# Patient Record
Sex: Female | Born: 1950 | Race: White | Hispanic: No | Smoking: Former smoker
Health system: Southern US, Community
[De-identification: ages and names within clinical notes are randomized; demographics above are authoritative.]

## PROBLEM LIST (undated history)

## (undated) DIAGNOSIS — Z950 Presence of cardiac pacemaker: Secondary | ICD-10-CM

## (undated) DIAGNOSIS — Z87442 Personal history of urinary calculi: Secondary | ICD-10-CM

## (undated) DIAGNOSIS — G629 Polyneuropathy, unspecified: Secondary | ICD-10-CM

## (undated) DIAGNOSIS — Z9221 Personal history of antineoplastic chemotherapy: Secondary | ICD-10-CM

## (undated) DIAGNOSIS — G4710 Hypersomnia, unspecified: Secondary | ICD-10-CM

## (undated) DIAGNOSIS — H409 Unspecified glaucoma: Secondary | ICD-10-CM

## (undated) DIAGNOSIS — C50919 Malignant neoplasm of unspecified site of unspecified female breast: Secondary | ICD-10-CM

## (undated) DIAGNOSIS — I429 Cardiomyopathy, unspecified: Secondary | ICD-10-CM

## (undated) DIAGNOSIS — M1990 Unspecified osteoarthritis, unspecified site: Secondary | ICD-10-CM

## (undated) DIAGNOSIS — E039 Hypothyroidism, unspecified: Secondary | ICD-10-CM

## (undated) DIAGNOSIS — Z923 Personal history of irradiation: Secondary | ICD-10-CM

## (undated) DIAGNOSIS — H269 Unspecified cataract: Secondary | ICD-10-CM

## (undated) DIAGNOSIS — Z8679 Personal history of other diseases of the circulatory system: Secondary | ICD-10-CM

## (undated) DIAGNOSIS — R519 Headache, unspecified: Secondary | ICD-10-CM

## (undated) DIAGNOSIS — N189 Chronic kidney disease, unspecified: Secondary | ICD-10-CM

## (undated) DIAGNOSIS — E119 Type 2 diabetes mellitus without complications: Secondary | ICD-10-CM

## (undated) DIAGNOSIS — K219 Gastro-esophageal reflux disease without esophagitis: Secondary | ICD-10-CM

## (undated) DIAGNOSIS — T7840XA Allergy, unspecified, initial encounter: Secondary | ICD-10-CM

## (undated) DIAGNOSIS — R000 Tachycardia, unspecified: Secondary | ICD-10-CM

## (undated) DIAGNOSIS — I10 Essential (primary) hypertension: Secondary | ICD-10-CM

## (undated) DIAGNOSIS — G43909 Migraine, unspecified, not intractable, without status migrainosus: Secondary | ICD-10-CM

## (undated) DIAGNOSIS — R600 Localized edema: Secondary | ICD-10-CM

## (undated) HISTORY — DX: Hypomagnesemia: E83.42

## (undated) HISTORY — PX: CARPAL TUNNEL RELEASE: SHX101

## (undated) HISTORY — DX: Chronic kidney disease, unspecified: N18.9

## (undated) HISTORY — DX: Unspecified cataract: H26.9

## (undated) HISTORY — PX: CHOLECYSTECTOMY: SHX55

## (undated) HISTORY — DX: Cardiomyopathy, unspecified: I42.9

## (undated) HISTORY — DX: Malignant neoplasm of unspecified site of unspecified female breast: C50.919

## (undated) HISTORY — DX: Tachycardia, unspecified: R00.0

## (undated) HISTORY — DX: Localized edema: R60.0

## (undated) HISTORY — DX: Presence of cardiac pacemaker: Z95.0

## (undated) HISTORY — PX: ANKLE SURGERY: SHX546

## (undated) HISTORY — DX: Type 2 diabetes mellitus without complications: E11.9

## (undated) HISTORY — DX: Allergy, unspecified, initial encounter: T78.40XA

## (undated) HISTORY — DX: Hypersomnia, unspecified: G47.10

## (undated) HISTORY — DX: Essential (primary) hypertension: I10

## (undated) HISTORY — PX: BREAST LUMPECTOMY: SHX2

---

## 1991-08-27 HISTORY — PX: GALLBLADDER SURGERY: SHX652

## 2002-08-26 DIAGNOSIS — Z950 Presence of cardiac pacemaker: Secondary | ICD-10-CM

## 2002-08-26 HISTORY — DX: Presence of cardiac pacemaker: Z95.0

## 2003-05-13 ENCOUNTER — Ambulatory Visit (HOSPITAL_COMMUNITY): Admission: RE | Admit: 2003-05-13 | Discharge: 2003-05-14 | Payer: Self-pay | Admitting: Internal Medicine

## 2003-05-13 ENCOUNTER — Encounter: Payer: Self-pay | Admitting: Internal Medicine

## 2003-05-13 HISTORY — PX: PACEMAKER INSERTION: SHX728

## 2003-05-14 ENCOUNTER — Encounter: Payer: Self-pay | Admitting: Internal Medicine

## 2005-05-06 ENCOUNTER — Ambulatory Visit: Payer: Self-pay | Admitting: Internal Medicine

## 2005-11-04 ENCOUNTER — Ambulatory Visit: Payer: Self-pay

## 2006-02-18 ENCOUNTER — Ambulatory Visit: Payer: Self-pay | Admitting: Gastroenterology

## 2006-05-01 ENCOUNTER — Ambulatory Visit: Payer: Self-pay

## 2007-03-20 ENCOUNTER — Ambulatory Visit: Payer: Self-pay

## 2007-08-27 DIAGNOSIS — Z923 Personal history of irradiation: Secondary | ICD-10-CM

## 2007-08-27 DIAGNOSIS — Z9221 Personal history of antineoplastic chemotherapy: Secondary | ICD-10-CM

## 2007-08-27 HISTORY — DX: Personal history of irradiation: Z92.3

## 2007-08-27 HISTORY — DX: Personal history of antineoplastic chemotherapy: Z92.21

## 2007-08-27 HISTORY — PX: BREAST LUMPECTOMY: SHX2

## 2007-08-27 HISTORY — PX: BREAST EXCISIONAL BIOPSY: SUR124

## 2007-09-27 ENCOUNTER — Ambulatory Visit: Payer: Self-pay | Admitting: Oncology

## 2007-09-27 DIAGNOSIS — C50919 Malignant neoplasm of unspecified site of unspecified female breast: Secondary | ICD-10-CM | POA: Insufficient documentation

## 2007-09-27 DIAGNOSIS — C50912 Malignant neoplasm of unspecified site of left female breast: Secondary | ICD-10-CM

## 2007-09-27 HISTORY — DX: Malignant neoplasm of unspecified site of left female breast: C50.912

## 2007-09-27 HISTORY — DX: Malignant neoplasm of unspecified site of unspecified female breast: C50.919

## 2007-10-13 ENCOUNTER — Other Ambulatory Visit: Payer: Self-pay

## 2007-10-13 ENCOUNTER — Ambulatory Visit: Payer: Self-pay | Admitting: General Surgery

## 2007-10-16 ENCOUNTER — Ambulatory Visit: Payer: Self-pay | Admitting: General Surgery

## 2007-10-20 ENCOUNTER — Ambulatory Visit: Payer: Self-pay | Admitting: Oncology

## 2007-10-25 ENCOUNTER — Ambulatory Visit: Payer: Self-pay | Admitting: Oncology

## 2007-10-26 ENCOUNTER — Ambulatory Visit: Payer: Self-pay | Admitting: Oncology

## 2007-11-23 ENCOUNTER — Ambulatory Visit: Payer: Self-pay | Admitting: General Surgery

## 2007-11-25 ENCOUNTER — Ambulatory Visit: Payer: Self-pay | Admitting: Oncology

## 2007-12-25 ENCOUNTER — Ambulatory Visit: Payer: Self-pay | Admitting: Oncology

## 2008-01-25 ENCOUNTER — Ambulatory Visit: Payer: Self-pay | Admitting: Oncology

## 2008-02-24 ENCOUNTER — Ambulatory Visit: Payer: Self-pay | Admitting: Oncology

## 2008-03-26 ENCOUNTER — Ambulatory Visit: Payer: Self-pay | Admitting: Oncology

## 2008-04-18 ENCOUNTER — Ambulatory Visit: Payer: Self-pay | Admitting: Cardiology

## 2008-04-26 ENCOUNTER — Ambulatory Visit: Payer: Self-pay | Admitting: Oncology

## 2008-05-26 ENCOUNTER — Ambulatory Visit: Payer: Self-pay | Admitting: Oncology

## 2008-06-26 ENCOUNTER — Ambulatory Visit: Payer: Self-pay | Admitting: Oncology

## 2008-07-18 ENCOUNTER — Ambulatory Visit: Payer: Self-pay | Admitting: Internal Medicine

## 2008-07-26 ENCOUNTER — Ambulatory Visit: Payer: Self-pay | Admitting: Oncology

## 2008-08-26 ENCOUNTER — Ambulatory Visit: Payer: Self-pay | Admitting: Oncology

## 2008-09-26 ENCOUNTER — Ambulatory Visit: Payer: Self-pay | Admitting: Oncology

## 2008-10-24 ENCOUNTER — Ambulatory Visit: Payer: Self-pay | Admitting: Oncology

## 2008-11-24 ENCOUNTER — Ambulatory Visit: Payer: Self-pay | Admitting: Oncology

## 2008-12-02 ENCOUNTER — Encounter (INDEPENDENT_AMBULATORY_CARE_PROVIDER_SITE_OTHER): Payer: Self-pay

## 2008-12-24 ENCOUNTER — Ambulatory Visit: Payer: Self-pay | Admitting: Oncology

## 2009-01-13 DIAGNOSIS — Z950 Presence of cardiac pacemaker: Secondary | ICD-10-CM

## 2009-01-18 ENCOUNTER — Ambulatory Visit: Payer: Self-pay | Admitting: Internal Medicine

## 2009-01-24 ENCOUNTER — Ambulatory Visit: Payer: Self-pay | Admitting: Oncology

## 2009-02-23 ENCOUNTER — Ambulatory Visit: Payer: Self-pay | Admitting: Oncology

## 2009-03-26 ENCOUNTER — Ambulatory Visit: Payer: Self-pay | Admitting: Oncology

## 2009-04-03 ENCOUNTER — Encounter: Payer: Self-pay | Admitting: Internal Medicine

## 2009-04-03 ENCOUNTER — Ambulatory Visit: Payer: Self-pay | Admitting: Oncology

## 2009-04-26 ENCOUNTER — Ambulatory Visit: Payer: Self-pay | Admitting: Oncology

## 2009-05-26 ENCOUNTER — Ambulatory Visit: Payer: Self-pay | Admitting: Oncology

## 2009-06-26 ENCOUNTER — Ambulatory Visit: Payer: Self-pay | Admitting: Oncology

## 2009-07-06 ENCOUNTER — Ambulatory Visit: Payer: Self-pay | Admitting: Oncology

## 2009-07-26 ENCOUNTER — Ambulatory Visit: Payer: Self-pay | Admitting: Oncology

## 2009-08-26 HISTORY — PX: INSERT / REPLACE / REMOVE PACEMAKER: SUR710

## 2009-09-25 ENCOUNTER — Ambulatory Visit: Payer: Self-pay | Admitting: Internal Medicine

## 2009-10-09 ENCOUNTER — Ambulatory Visit: Payer: Self-pay | Admitting: Internal Medicine

## 2009-10-23 ENCOUNTER — Ambulatory Visit: Payer: Self-pay | Admitting: Oncology

## 2009-10-24 ENCOUNTER — Ambulatory Visit: Payer: Self-pay | Admitting: Oncology

## 2009-10-26 ENCOUNTER — Ambulatory Visit: Payer: Self-pay | Admitting: Oncology

## 2009-10-26 ENCOUNTER — Encounter: Payer: Self-pay | Admitting: Internal Medicine

## 2009-11-13 ENCOUNTER — Ambulatory Visit: Payer: Self-pay | Admitting: Internal Medicine

## 2009-11-24 ENCOUNTER — Ambulatory Visit: Payer: Self-pay | Admitting: Oncology

## 2009-12-18 ENCOUNTER — Ambulatory Visit: Payer: Self-pay | Admitting: Internal Medicine

## 2010-01-24 ENCOUNTER — Ambulatory Visit: Payer: Self-pay | Admitting: Oncology

## 2010-01-25 ENCOUNTER — Encounter: Payer: Self-pay | Admitting: Internal Medicine

## 2010-01-25 ENCOUNTER — Ambulatory Visit: Payer: Self-pay | Admitting: Oncology

## 2010-02-06 ENCOUNTER — Ambulatory Visit: Payer: Self-pay | Admitting: Internal Medicine

## 2010-02-23 ENCOUNTER — Ambulatory Visit: Payer: Self-pay | Admitting: Oncology

## 2010-03-12 ENCOUNTER — Ambulatory Visit: Payer: Self-pay | Admitting: Internal Medicine

## 2010-04-23 ENCOUNTER — Ambulatory Visit: Payer: Self-pay | Admitting: Internal Medicine

## 2010-04-26 ENCOUNTER — Ambulatory Visit: Payer: Self-pay | Admitting: Oncology

## 2010-05-24 ENCOUNTER — Ambulatory Visit: Payer: Self-pay | Admitting: Oncology

## 2010-05-24 ENCOUNTER — Encounter: Payer: Self-pay | Admitting: Internal Medicine

## 2010-05-26 ENCOUNTER — Ambulatory Visit: Payer: Self-pay | Admitting: Oncology

## 2010-06-22 ENCOUNTER — Encounter (INDEPENDENT_AMBULATORY_CARE_PROVIDER_SITE_OTHER): Payer: Self-pay | Admitting: *Deleted

## 2010-06-26 ENCOUNTER — Ambulatory Visit: Payer: Self-pay | Admitting: Oncology

## 2010-07-02 ENCOUNTER — Ambulatory Visit: Payer: Self-pay | Admitting: Internal Medicine

## 2010-07-02 DIAGNOSIS — I440 Atrioventricular block, first degree: Secondary | ICD-10-CM | POA: Insufficient documentation

## 2010-07-04 ENCOUNTER — Telehealth: Payer: Self-pay | Admitting: Internal Medicine

## 2010-07-04 ENCOUNTER — Emergency Department (HOSPITAL_COMMUNITY): Admission: EM | Admit: 2010-07-04 | Discharge: 2010-07-04 | Payer: Self-pay | Admitting: Emergency Medicine

## 2010-07-25 ENCOUNTER — Telehealth: Payer: Self-pay | Admitting: Internal Medicine

## 2010-07-26 ENCOUNTER — Ambulatory Visit: Payer: Self-pay | Admitting: Oncology

## 2010-07-31 ENCOUNTER — Ambulatory Visit: Payer: Self-pay | Admitting: Internal Medicine

## 2010-07-31 ENCOUNTER — Ambulatory Visit: Payer: Self-pay

## 2010-07-31 ENCOUNTER — Encounter: Payer: Self-pay | Admitting: Internal Medicine

## 2010-07-31 ENCOUNTER — Encounter: Payer: Self-pay | Admitting: Cardiovascular Disease

## 2010-07-31 LAB — CONVERTED CEMR LAB
BUN: 16 mg/dL (ref 6–23)
Calcium: 9.5 mg/dL (ref 8.4–10.5)
Creatinine, Ser: 0.66 mg/dL (ref 0.40–1.20)
Glucose, Bld: 94 mg/dL (ref 70–99)
INR: 0.98 (ref <1.50)
Prothrombin Time: 13.2 s (ref 11.6–15.2)

## 2010-08-03 ENCOUNTER — Ambulatory Visit (HOSPITAL_COMMUNITY)
Admission: RE | Admit: 2010-08-03 | Discharge: 2010-08-03 | Payer: Self-pay | Source: Home / Self Care | Attending: Internal Medicine | Admitting: Internal Medicine

## 2010-08-03 ENCOUNTER — Encounter: Payer: Self-pay | Admitting: Internal Medicine

## 2010-08-15 ENCOUNTER — Ambulatory Visit: Payer: Self-pay | Admitting: Internal Medicine

## 2010-08-16 ENCOUNTER — Encounter: Payer: Self-pay | Admitting: Internal Medicine

## 2010-08-26 ENCOUNTER — Ambulatory Visit: Payer: Self-pay | Admitting: Oncology

## 2010-08-30 LAB — CANCER ANTIGEN 27.29: CA 27.29: 28.2 U/mL (ref 0.0–38.6)

## 2010-09-25 NOTE — Procedures (Signed)
Summary: PACER/AMD      Allergies Added:   Current Medications (verified): 1)  Benazepril Hcl 20 Mg Tabs (Benazepril Hcl) .... One  By Mouth Daily 2)  Atenolol 50 Mg Tabs (Atenolol) .... One By Mouth Daily  Allergies (verified): 1)  ! Penicillin   PPM Specifications Following MD:  Sherryl Manges, MD     [facility] Vendor:  [facility] [employer]     PPM Model Number:  [id number]     PPM Serial Number:  [id number] PPM DOI:  05/13/2003     PPM Implanting MD:  Sherryl Manges, MD  Lead 1    Location: RA     DOI: 05/13/2003     Model #: [id number]     Serial #: [id number]     Status: active Lead 2    Location: RV     DOI: 05/13/2003     Model #: 1478     Serial #: [id number]     Status: active  Magnet Response Rate:  BOL 100 ERI  85  Indications:  1ST HB with CHF   PPM Follow Up Remote Check?  No Battery Voltage:  good V     Battery Est. Longevity:  <0.5 years     Pacer Dependent:  No      Episodes Coumadin:  No  Parameters Mode:  DDD     Lower Rate Limit:  60     Upper Rate Limit:  130 Paced AV Delay:  150     Sensed AV Delay:  80 Next Cardiology Appt Due:  10/24/2009 Tech Comments:  Battery check only today, near ERI.  ROV 1 month in [place] with Dr. Graciela Husbands. Altha Harm, LPN  October 09, 2009 8:56 AM

## 2010-09-25 NOTE — Progress Notes (Signed)
Summary: [facility]  [facility]   Imported By: Harlon Flor 11/08/2009 12:02:22  _____________________________________________________________________  External Attachment:    Type:   Image     Comment:   External Document

## 2010-09-25 NOTE — Procedures (Signed)
Summary: ROV/AMD    Allergies: 1)  ! Penicillin   PPM Specifications Following MD:  Sherryl Manges, MD     [facility] Vendor:  [facility] [employer]     PPM Model Number:  [phone]     PPM Serial Number:  [id number] PPM DOI:  05/13/2003     PPM Implanting MD:  Sherryl Manges, MD  Lead 1    Location: RA     DOI: 05/13/2003     Model #: 4479     Serial #: [id number]     Status: active Lead 2    Location: RV     DOI: 05/13/2003     Model #: 1478     Serial #: [id number]     Status: active  Magnet Response Rate:  BOL 100 ERI  85  Indications:  1ST HB with CHF   PPM Follow Up Remote Check?  No Battery Voltage:  good V     Battery Est. Longevity:  <0.5 years     Pacer Dependent:  No      Episodes Coumadin:  No  Parameters Mode:  DDD     Lower Rate Limit:  60     Upper Rate Limit:  130 Paced AV Delay:  150     Sensed AV Delay:  80 Tech Comments:  Battery check only today. Near ERI.  ROV 1 months with Dr. Graciela Husbands in [place]. Altha Harm, LPN  December 18, 2009 11:12 AM

## 2010-09-25 NOTE — Op Note (Signed)
Summary: Implant Order  Implant Order   Imported By: Harlon Flor 08/02/2010 14:41:09  _____________________________________________________________________  External Attachment:    Type:   Image     Comment:   External Document

## 2010-09-25 NOTE — Cardiovascular Report (Signed)
Summary: Office Visit   Office Visit   Imported By: Roderic Ovens 03/23/2010 10:19:07  _____________________________________________________________________  External Attachment:    Type:   Image     Comment:   External Document

## 2010-09-25 NOTE — Assessment & Plan Note (Signed)
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 She also has a history of breast cancer for which she is undergoing therapy which as been complicated by some degree of nonischemic cardiomyopathy.  she has no major complaints. The patient denies SOB, chest pain, edema or palpitations   Current Medications (verified): 1)  Benazepril Hcl 20 Mg Tabs (Benazepril Hcl) .... One  By Mouth Daily 2)  Atenolol 50 Mg Tabs (Atenolol) .... One By Mouth Daily  Allergies (verified): 1)  ! Penicillin  Physical Exam  General:  The patient was alert and oriented in no acute distress. HEENT Normal.  Neck veins were flat, carotids were brisk.  Lungs were clear.  Heart sounds were regular without murmurs or gallops.  Abdomen was soft with active bowel sounds. There is no clubbing cyanosis or edema. Skin Warm and dry    PPM Specifications Following MD:  Sherryl Manges, MD     [facility] Vendor:  [facility] [employer]     PPM Model Number:  [phone]     PPM Serial Number:  [id number] PPM DOI:  05/13/2003     PPM Implanting MD:  Sherryl Manges, MD  Lead 1    Location: RA     DOI: 05/13/2003     Model #: 4479     Serial #: [id number]     Status: active Lead 2    Location: RV     DOI: 05/13/2003     Model #: 6440     Serial #: [id number]     Status: active  Magnet Response Rate:  BOL 100 ERI  85  Indications:  1ST HB with CHF   PPM Follow Up Remote Check?  No Battery Voltage:  good V     Battery Est. Longevity:  <0.5 years     Pacer Dependent:  No      Episodes Coumadin:  No  Parameters Mode:  DDD     Lower Rate Limit:  60     Upper Rate Limit:  130 Paced AV Delay:  150     Sensed AV Delay:  80 Tech Comments:  Battery check only today, 5% remaining.   ROV 1 months with Dr. Graciela Husbands in [place]. 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 One  by mouth daily    Atenolol 50 Mg Tabs (Atenolol) ..... One by mouth daily  Problem # 3:  CARDIOMYOPATHY, SECONDARY (ICD-425.9) Continue current medications; she may need her repeat evaluation of left ventricular function Her updated medication list for this problem includes:    Benazepril Hcl 20 Mg Tabs (Benazepril hcl) ..... One  by mouth daily    Atenolol 50 Mg Tabs (Atenolol) ..... One by mouth daily

## 2010-09-25 NOTE — Procedures (Signed)
Summary: PACER/AMD  Medications Added BENAZEPRIL HCL 20 MG TABS (BENAZEPRIL HCL) one  by mouth daily ATENOLOL 50 MG TABS (ATENOLOL) one by mouth daily      Allergies Added: ! PENICILLIN  Current Medications (verified): 1)  Benazepril Hcl 20 Mg Tabs (Benazepril Hcl) .... One  By Mouth Daily 2)  Atenolol 50 Mg Tabs (Atenolol) .... One By Mouth Daily  Allergies (verified): 1)  ! Penicillin   PPM Specifications Following MD:  Sherryl Manges, MD     [facility] Vendor:  [facility] [employer]     PPM Model Number:  [phone]     PPM Serial Number:  [id number] PPM DOI:  05/13/2003     PPM Implanting MD:  Sherryl Manges, MD  Lead 1    Location: RA     DOI: 05/13/2003     Model #: 4479     Serial #: [id number]     Status: active Lead 2    Location: RV     DOI: 05/13/2003     Model #: 1478     Serial #: [id number]     Status: active  Magnet Response Rate:  BOL 100 ERI  85  Indications:  1ST HB with CHF   PPM Follow Up Remote Check?  No Battery Voltage:  good V     Battery Est. Longevity:  <0.5years     Pacer Dependent:  No       PPM Device Measurements Atrium  Amplitude: 1.4 mV, Impedance: 420 ohms, Threshold: 1.0 V at 0.4 msec Right Ventricle  Amplitude: 12 mV, Impedance: 430 ohms, Threshold: 0.7 V at 0.4 msec  Episodes MS Episodes:  4     Percent Mode Switch:  0%     Coumadin:  No Ventricular High Rate:  1     Atrial Pacing:  0%     Ventricular Pacing:  99%  Parameters Mode:  DDD     Lower Rate Limit:  60     Upper Rate Limit:  130 Paced AV Delay:  150     Sensed AV Delay:  80 Next Cardiology Appt Due:  11/24/2009 Tech Comments:  No parameter changes.  One episode of questionable VT 16 beats, I will discuss this with Dr. Graciela Husbands.    Battery near ERI.  We will check her battery on a monthly basis. Altha Harm, LPN  September 25, 2009 8:09 AM

## 2010-09-25 NOTE — Assessment & Plan Note (Signed)
Summary: F1M/AMD    Allergies: 1)  ! Penicillin   PPM Specifications Following MD:  Sherryl Manges, MD     [facility] Vendor:  [facility] [employer]     PPM Model Number:  [phone]     PPM Serial Number:  [id number] PPM DOI:  05/13/2003     PPM Implanting MD:  Sherryl Manges, MD  Lead 1    Location: RA     DOI: 05/13/2003     Model #: 4479     Serial #: [id number]     Status: active Lead 2    Location: RV     DOI: 05/13/2003     Model #: 1478     Serial #: [id number]     Status: active  Magnet Response Rate:  BOL 100 ERI  85  Indications:  1ST HB with CHF   PPM Follow Up Pacer Dependent:  No      Episodes Coumadin:  No  Parameters Mode:  DDD     Lower Rate Limit:  60     Upper Rate Limit:  130 Paced AV Delay:  150     Sensed AV Delay:  80

## 2010-09-25 NOTE — Procedures (Signed)
Summary: PACER/AMD    Allergies: 1)  ! Penicillin   PPM Specifications Following MD:  Sherryl Manges, MD     [facility] Vendor:  [facility] [employer]     PPM Model Number:  [phone]     PPM Serial Number:  [id number] PPM DOI:  05/13/2003     PPM Implanting MD:  Sherryl Manges, MD  Lead 1    Location: RA     DOI: 05/13/2003     Model #: 4479     Serial #: [id number]     Status: active Lead 2    Location: RV     DOI: 05/13/2003     Model #: 1478     Serial #: [id number]     Status: active  Magnet Response Rate:  BOL 100 ERI  85  Indications:  1ST HB with CHF   PPM Follow Up Pacer Dependent:  No      Episodes Coumadin:  No  Parameters Mode:  DDD     Lower Rate Limit:  60     Upper Rate Limit:  130 Paced AV Delay:  150     Sensed AV Delay:  80

## 2010-09-25 NOTE — Miscellaneous (Signed)
Summary: dx correction  Clinical Lists Changes  Problems: Changed problem from PACEMAKER (ICD-V45..01) to PACEMAKER, PERMANENT (ICD-V45.01)  changed the incorrect dx code to correct dx code 

## 2010-09-25 NOTE — Letter (Signed)
Summary: [facility]  [facility]   Imported By: Harlon Flor 02/09/2010 10:26:43  _____________________________________________________________________  External Attachment:    Type:   Image     Comment:   External Document

## 2010-09-25 NOTE — Letter (Signed)
Summary: [facility] QOPI Note   [facility]OPI Note   Imported By: Roderic Ovens 06/06/2010 10:38:22  _____________________________________________________________________  External Attachment:    Type:   Image     Comment:   External Document

## 2010-09-25 NOTE — Letter (Signed)
Summary: Implantable Device Instructions  Architectural technologist at [facility] [address]   [place], [place] [address]   Phone: [phone]  Fax: [phone]      Implantable Device Instructions  You are scheduled for:  _____ Permanent Transvenous Pacemaker _____ Implantable Cardioverter Defibrillator _____ Implantable Loop Recorder __x___ Generator Change  on 08/03/10 at [facility] with Dr. Graciela Husbands.  1.  Please arrive at the Short Stay Center at [facility] at 5:45am on the day of your procedure.  2.  Do not eat or drink the night before your procedure.  3. Labwork done today in office.  4.  Take medications as prescribed with a sip of water.  5.  Bring your insurance cards and a list of your medications.  6.  Wash your chest and neck with antibacterial soap (any brand) the evening before and the morning of your procedure.  Rinse well.  7.  Education material received:     Pacemaker _x____           ICD _____           Arrhythmia _____  *If you have ANY questions after you get home, please call the office [phone]  *Every attempt is made to prevent procedures from being rescheduled.  Due to the nauture of Electrophysiology, rescheduling can happen.  The physician is always aware and directs the staff when this occurs.

## 2010-09-25 NOTE — Cardiovascular Report (Signed)
Summary: Office Visit   Office Visit   Imported By: Roderic Ovens 09/27/2009 13:48:53  _____________________________________________________________________  External Attachment:    Type:   Image     Comment:   External Document

## 2010-09-25 NOTE — Cardiovascular Report (Signed)
Summary: Office Visit   Office Visit   Imported By: Roderic Ovens 05/08/2010 11:18:38  _____________________________________________________________________  External Attachment:    Type:   Image     Comment:   External Document

## 2010-09-25 NOTE — Procedures (Signed)
Summary: F1M/AMD      Allergies Added:   Current Medications (verified): 1)  Benazepril Hcl 20 Mg Tabs (Benazepril Hcl) .... One  By Mouth Daily 2)  Atenolol 50 Mg Tabs (Atenolol) .... One By Mouth Daily  Allergies (verified): 1)  ! Penicillin   PPM Specifications Following MD:  Sherryl Manges, MD     [facility] Vendor:  [facility] [employer]     PPM Model Number:  [phone]     PPM Serial Number:  [id number] PPM DOI:  05/13/2003     PPM Implanting MD:  Sherryl Manges, MD  Lead 1    Location: RA     DOI: 05/13/2003     Model #: 4479     Serial #: [id number]     Status: active Lead 2    Location: RV     DOI: 05/13/2003     Model #: 1478     Serial #: [id number]     Status: active  Magnet Response Rate:  BOL 100 ERI  85  Indications:  1ST HB with CHF   PPM Follow Up Remote Check?  No Battery Voltage:  good V     Battery Est. Longevity:  <0.5 years     Pacer Dependent:  No      Episodes Coumadin:  No  Parameters Mode:  DDD     Lower Rate Limit:  60     Upper Rate Limit:  130 Paced AV Delay:  150     Sensed AV Delay:  80 Tech Comments:  Battery check only today.  ROV 1 month in [place]. Altha Harm, LPN  February 06, 2010 2:42 PM

## 2010-09-25 NOTE — Assessment & Plan Note (Signed)
Summary: F2M/AMD  Medications Added MAGNESIUM 500 MG TABS (MAGNESIUM) two tablets daily MAGNESIUM SULFATE IN D5W 10-5 MG/ML-% SOLN (MAGNESIUM SULFATE IN D5W) 4 grams      Allergies Added:   Visit Type:  Follow-up Primary Provider:  Loraine Leriche Crissman,M.D.  CC:  Denies chest pain or shortness of breath. Has some fluttering in chest at times. 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 She also has a history of breast cancer for which she underwent chemotherapy which has been complicated most recently by hypomagnesemia requiring oral and intravenous for sedation.  This is left with some degree of fatigue muscle aches and occasional palpitations.    Current Medications (verified): 1)  Benazepril Hcl 20 Mg Tabs (Benazepril Hcl) .... One  By Mouth Daily 2)  Atenolol 50 Mg Tabs (Atenolol) .... One By Mouth Daily 3)  Flonase 50 Mcg/act Susp (Fluticasone Propionate) .... Once Daily 4)  Magnesium 500 Mg Tabs (Magnesium) .... Two Tablets Daily 5)  Magnesium Sulfate in D5w 10-5 Mg/ml-% Soln (Magnesium Sulfate in D5w) .... 4 Grams  Allergies (verified): 1)  ! Penicillin  Past History:  Past Medical History: Last updated: 01/13/2009 CARDIOMYOPATHY, SECONDARY (ICD-425.9) AV BLOCK, 1ST DEGREE (ICD-426.11) PACEMAKER (ICD-V45.Marland Kitchen01)    Past Surgical History: Last updated: 01/13/2009 c-section - '83 gall bladder - '93 pacemaker - [employer] Entra-DR - 05/13/03  Family History: Last updated: 01/13/2009 Family History of CVA or Stroke:   Social History: Last updated: 01/13/2009 Full Time Divorced  Tobacco Use - Former.  - '83 Alcohol Use - no Regular Exercise - yes Drug Use - no  Risk Factors: Exercise: yes (01/13/2009)  Risk Factors: Smoking Status: quit (01/13/2009)  Vital Signs:  Patient profile:   60 year old  female Height:      65 inches Weight:      226 pounds BMI:     37.74 Pulse rate:   80 / minute BP sitting:   112 / 76  (left arm) Cuff size:   large  Vitals Entered By: Bishop Dublin, CMA (July 02, 2010 10:00 AM)  Physical Exam  General:  The patient was alert and oriented in no acute distress. HEENT Normal.  Neck veins were flat, carotids were brisk.  Lungs were clear.  Heart sounds were regular without murmurs or gallops.  Abdomen was soft with active bowel sounds. There is no clubbing cyanosis or edema. Skin Warm and dry    PPM Specifications Following MD:  Sherryl Manges, MD     [facility] Vendor:  [facility] [employer]     PPM Model Number:  [phone]     PPM Serial Number:  [id number] PPM DOI:  05/13/2003     PPM Implanting MD:  Sherryl Manges, MD  Lead 1    Location: RA     DOI: 05/13/2003     Model #: 4479     Serial #: [id number]     Status: active Lead 2    Location: RV     DOI: 05/13/2003     Model #: 5573     Serial #: [id number]     Status: active  Magnet Response Rate:  BOL 100 ERI  85  Indications:  1ST HB with CHF   PPM Follow Up Remote Check?  No Battery Voltage:  ERT V     Battery Est. Longevity:  ERT  Pacer Dependent:  Yes      Episodes Coumadin:  No  Parameters Mode:  DDD     Lower Rate Limit:  60     Upper Rate Limit:  130 Paced AV Delay:  150     Sensed AV Delay:  80 Tech Comments:  Battery reached ERT 05/03/10 Altha Harm, LPN  July 02, 2010 10:06 AM   Impression & Recommendations:  Problem # 1:  AV BLOCK, 1ST DEGREE (ICD-426.11) the patient is 100%ventricularly paced.  SHe has an intrinsic rhythm.  her device has reached ERI. She'll need to undergo device generator replacement. I discussed with her the potential benefits as well as potential risks including but not limited to lead fracture and infection. She understands these risks and would like to proceed.  intrinsic AV delays about 250 msed  oblem includes:    Benazepril Hcl 20 Mg Tabs (Benazepril hcl)  ..... One  by mouth daily    Atenolol 50 Mg Tabs (Atenolol) ..... One by mouth daily  Problem # 2:  CARDIOMYOPATHY, SECONDARY (ICD-425.9) Assessment: New  we will need . to reassess her left ventricular function to see whether it be appropriate to consider ICD implantation or CRT upgrade. Her updated medication list for this problem includes:    Benazepril Hcl 20 Mg Tabs (Benazepril hcl) ..... One  by mouth daily    Atenolol 50 Mg Tabs (Atenolol) ..... One by mouth daily  Orders: Echocardiogram (Echo)  Problem # 3:  PACEMAKER, PERMANENT (ICD-V45.01) Device parameters and data were reviewed and no changes were made  Patient Instructions: 1)  Your physician has requested that you have an echocardiogram.  Echocardiography is a painless test that uses sound waves to create images of your heart. It provides your doctor with information about the size and shape of your heart and how well your heart's chambers and valves are working.  This procedure takes approximately one hour. There are no restrictions for this procedure. 2)  Your physician has recommended that you have a pacemaker generator change.  A pacemaker is a small device that is placed under the skin of your chest or abdomen to help control abnormal heart rhythms. This device uses electrical pulses to prompt the heart to beat at a normal rate. Pacemakers are used to treat heart rhythms that are too slow. Wires (leads) are attached to the pacemaker that goes into the chambers of your heart. This is done in the hospital and usually requires an overnight stay. Please see the instruction sheet given to you today for more information.

## 2010-09-25 NOTE — Assessment & Plan Note (Signed)
Summary: F/U 1 MONTH PER PAULA      Allergies Added:   Visit Type:  Follow-up  CC:  "Doing well"..  Current Medications (verified): 1)  Benazepril Hcl 20 Mg Tabs (Benazepril Hcl) .... One  By Mouth Daily 2)  Atenolol 50 Mg Tabs (Atenolol) .... One By Mouth Daily 3)  Flonase 50 Mcg/act Susp (Fluticasone Propionate) .... Once Daily  Allergies (verified): 1)  ! Penicillin  Past History:  Past Medical History: Last updated: 01/13/2009 CARDIOMYOPATHY, SECONDARY (ICD-425.9) AV BLOCK, 1ST DEGREE (ICD-426.11) PACEMAKER (ICD-V45.Marland Kitchen01)    Past Surgical History: Last updated: 01/13/2009 c-section - '83 gall bladder - '93 pacemaker - [employer] Entra-DR - 05/13/03  Family History: Last updated: 01/13/2009 Family History of CVA or Stroke:   Social History: Last updated: 01/13/2009 Full Time Divorced  Tobacco Use - Former.  - '83 Alcohol Use - no Regular Exercise - yes Drug Use - no  Risk Factors: Exercise: yes (01/13/2009)  Risk Factors: Smoking Status: quit (01/13/2009)  Vitals Entered By: Bishop Dublin, CMA (April 23, 2010 12:24 PM)   PPM Specifications Following MD:  Sherryl Manges, MD     [facility] Vendor:  [facility] [employer]     PPM Model Number:  [phone]     PPM Serial Number:  [id number] DOI:  05/13/2003     PPM Implanting MD:  Sherryl Manges, MD  Lead 1    Location: RA     DOI: 05/13/2003     Model #: 4479     Serial #: [id number]     Status: active Lead 2    Location: RV     DOI: 05/13/2003     Model #: 8841     Serial #: [id number]     Status: active  Magnet Response Rate:  BOL 100 ERI  85  Indications:  1ST HB with CHF   PPM Follow Up Remote Check?  No Battery Voltage:  good V     Battery Est. Longevity:  <0.5years     Pacer Dependent:  Yes      Episodes Coumadin:  No Atrial Pacing:  0%     Ventricular Pacing:  100%  Parameters Mode:  DDD     Lower Rate Limit:  60     Upper Rate Limit:  130 Paced AV Delay:  150     Sensed AV Delay:  80 Next Cardiology Appt Due:   05/26/2010 Tech Comments:  Battery check only.  Near ERI.  ROV 2 months in [place]. Altha Harm, LPN  April 23, 2010 12:29 PM

## 2010-09-25 NOTE — Miscellaneous (Signed)
Summary: Device change out  Clinical Lists Changes  Observations: Added new observation of PPM DOI: 08/03/2010 (08/03/2010 13:14) Added new observation of PPM SERL#: [id number]  (08/03/2010 13:14) Added new observation of PPM MODL#: [id number]  (08/03/2010 13:14) Added new observation of PPMEXPLCOMM: 08/03/10 [employer] [id number] explanted  (08/03/2010 13:14)      PPM Specifications Following MD:  Sherryl Manges, MD     PPM Vendor:  [facility] [employer]     PPM Model Number:  [phone]     PPM Serial Number:  [id number] DOI:  08/03/2010     PPM Implanting MD:  Sherryl Manges, MD  Lead 1    Location: RA     DOI: 05/13/2003     Model #: 4479     Serial #: [id number]     Status: active Lead 2    Location: RV     DOI: 05/13/2003     Model #: 7106     Serial #: [id number]     Status: active  Magnet Response Rate:  BOL 100 ERI  85  Indications:  1ST HB with CHF  Explantation Comments:  08/03/10 [employer] [id number] explanted  PPM Follow Up Pacer Dependent:  Yes      Episodes Coumadin:  No  Parameters Mode:  DDD     Lower Rate Limit:  60     Upper Rate Limit:  130 Paced AV Delay:  150     Sensed AV Delay:  80

## 2010-09-25 NOTE — Progress Notes (Signed)
Summary: SOB AND DIZZY   Phone Note Call from Patient Call back at Home Phone [phone]   Caller: SELF Call For: [name] Reason for Call: Insurance Question Summary of Call: PT IS FEELING SOB AND DIZZY-HAS FALLEN THIS MORNING-WANTS TO KNOW IF THAT IS NORMAL Initial call taken by: Harlon Flor,  July 04, 2010 8:55 AM  Follow-up for Phone Call        notified patient regarding symptoms--she states started this a.m. and some yesterday.  She is having labored breathing with dizziness and almost fell but she caught herself on the wall. She is concerned of what is going on. Follow-up by: Bishop Dublin, CMA,  July 04, 2010 10:00 AM  Additional Follow-up for Phone Call Additional follow up Details #1::        notified patient per. Dr. Shirlee Latch needs to have someone take her to [facility]. She understands instructions and will have someone take her. Additional Follow-up by: Bishop Dublin, CMA,  July 04, 2010 12:47 PM    Additional Follow-up for Phone Call Additional follow up Details #2::    Pt call this am and states she went to [facility] had labwork and xray done evaluated for CP and dizziness.  Cardiac enzymes were negative and pt was sent home and instructed to f/u with Dr [name].  Pt was just seen by Dr [name] in office on 07/02/10.  Pt's pacer is at EOL and is being scheduled for change out for the first of December at [facility].  Please advise if need to see pt or schedlue change out for sooner date. Pt states symptoms have subsided, advised if reoccur or persist to go to ED.  Follow-up by: Cloyde Reams RN,  July 05, 2010 11:04 AM  Additional Follow-up for Phone Call Additional follow up Details #3:: Details for Additional Follow-up Action Taken: sounds reasonable  if symptoms persist will need to get checied by her pcp  Additional Follow-up by: Nathen May, MD, [facility],  July 05, 2010 5:35 PM   Appended Document: SOB AND DIZZY Notified patient of symptoms if doing  about the same or is she better.  She states is feeling much better and she will let us know if has any other symptoms.

## 2010-09-25 NOTE — Progress Notes (Signed)
Summary: Appt   Phone Note Call from Patient Call back at Home Phone [phone]   Caller: Self Call For: [name] Summary of Call: Pt returning a call about an appt being scheduled for this Friday. Initial call taken by: Harlon Flor,  July 25, 2010 11:25 AM  Follow-up for Phone Call        patient can't come for pacer change on Dec. 2, 2011 but can come on August 03, 2010 for the pacer change. Follow-up by: Bishop Dublin, CMA,  July 25, 2010 4:34 PM

## 2010-09-25 NOTE — Procedures (Signed)
Summary: F1M/GLC  Medications Added FLONASE 50 MCG/ACT SUSP (FLUTICASONE PROPIONATE) once daily        Visit Type:  39mo f/u   Current Medications (verified): 1)  Benazepril Hcl 20 Mg Tabs (Benazepril Hcl) .... One  By Mouth Daily 2)  Atenolol 50 Mg Tabs (Atenolol) .... One By Mouth Daily 3)  Flonase 50 Mcg/act Susp (Fluticasone Propionate) .... Once Daily  Allergies: 1)  ! Penicillin  Vitals Entered By: Danielle Rankin, CMA (March 12, 2010 3:28 PM)   PPM Specifications Following MD:  Sherryl Manges, MD     [facility] Vendor:  [facility] [employer]     PPM Model Number:  [id number]     PPM Serial Number:  [id number] PPM DOI:  05/13/2003     PPM Implanting MD:  Sherryl Manges, MD  Lead 1    Location: RA     DOI: 05/13/2003     Model #: [id number]     Serial #: [id number]     Status: active Lead 2    Location: RV     DOI: 05/13/2003     Model #: [id number]     Serial #: [id number]     Status: active  Magnet Response Rate:  BOL 100 ERI  85  Indications:  1ST HB with CHF   PPM Follow Up Remote Check?  No Battery Voltage:  good V     Battery Est. Longevity:  <0.5years     Pacer Dependent:  Yes      Episodes MS Episodes:  0     Percent Mode Switch:  0     Coumadin:  No Atrial Pacing:  0%     Ventricular Pacing:  100%  Parameters Mode:  DDD     Lower Rate Limit:  60     Upper Rate Limit:  130 Paced AV Delay:  150     Sensed AV Delay:  80 Next Cardiology Appt Due:  03/26/2010 Tech Comments:  No parameter changes.  Battery near ERI.  ROV 1 month in [place]. Altha Harm, LPN  March 12, 2010 3:48 PM

## 2010-09-26 ENCOUNTER — Ambulatory Visit: Payer: Self-pay | Admitting: Oncology

## 2010-09-27 NOTE — Procedures (Signed)
Summary: wound check/jml  Medications Added SLOW-MAG 71.5-119 MG TBEC (MAGNESIUM CL-CALCIUM CARBONATE) 2 by mouth two times a day      Allergies Added:   Current Medications (verified): 1)  Benazepril Hcl 20 Mg Tabs (Benazepril Hcl) .... One  By Mouth Daily 2)  Atenolol 50 Mg Tabs (Atenolol) .... One By Mouth Daily 3)  Flonase 50 Mcg/act Susp (Fluticasone Propionate) .... Once Daily 4)  Magnesium Sulfate in D5w 10-5 Mg/ml-% Soln (Magnesium Sulfate in D5w) .... 4 Grams 5)  Slow-Mag 71.5-119 Mg Tbec (Magnesium Cl-Calcium Carbonate) .... 2 By Mouth Two Times A Day  Allergies (verified): 1)  ! Penicillin  PPM Specifications Following MD:  Sherryl Manges, MD     [facility] Vendor:  [facility] Scientific     PPM Model Number:  [id number]     PPM Serial Number:  [id number] PPM DOI:  08/03/2010     PPM Implanting MD:  Sherryl Manges, MD  Lead 1    Location: RA     DOI: 05/13/2003     Model #: [id number]     Serial #: [id number]     Status: active Lead 2    Location: RV     DOI: 05/13/2003     Model #: [id number]     Serial #: [id number]     Status: active  Magnet Response Rate:  BOL 100 ERI  85  Indications:  1ST HB with CHF  Explantation Comments:  08/03/10 [employer] [id number]/117703 explanted  PPM Follow Up Remote Check?  No Battery Voltage:  good V     Battery Est. Longevity:  > 5 years     Pacer Dependent:  Yes       PPM Device Measurements Atrium  Amplitude: 2.0 mV, Impedance: 440 ohms, Threshold: 0.4 V at 0.4 msec Right Ventricle  Amplitude: 12 mV, Impedance: 440 ohms, Threshold: 0.7 V at 0.4 msec  Episodes MS Episodes:  9     Percent Mode Switch:  0%     Coumadin:  No Atrial Pacing:  0%     Ventricular Pacing:  82%  Parameters Mode:  DDD     Lower Rate Limit:  60     Upper Rate Limit:  120 Paced AV Delay:  150     Sensed AV Delay:  80 Next Cardiology Appt Due:  10/25/2010 Tech Comments:  Steri strips removed by the patient, no redness or edema noted.  No parameter changes.  Device function normal.  ROV 3  months with Dr.Klein in [place]. Altha Harm, LPN  August 16, 2010 10:07 AM

## 2010-09-27 NOTE — Cardiovascular Report (Signed)
Summary: Office Visit   Office Visit   Imported By: Roderic Ovens 09/04/2010 16:26:48  _____________________________________________________________________  External Attachment:    Type:   Image     Comment:   External Document

## 2010-10-25 ENCOUNTER — Ambulatory Visit: Payer: Self-pay | Admitting: Oncology

## 2010-11-05 LAB — SURGICAL PCR SCREEN
MRSA, PCR: NEGATIVE
Staphylococcus aureus: NEGATIVE

## 2010-11-05 LAB — CBC
Platelets: 186 10*3/uL (ref 150–400)
RBC: 4.43 MIL/uL (ref 3.87–5.11)
RDW: 12.9 % (ref 11.5–15.5)
WBC: 5.7 10*3/uL (ref 4.0–10.5)

## 2010-11-06 LAB — GLUCOSE, CAPILLARY: Glucose-Capillary: 111 mg/dL — ABNORMAL HIGH (ref 70–99)

## 2010-11-06 LAB — URINE MICROSCOPIC-ADD ON

## 2010-11-06 LAB — URINALYSIS, ROUTINE W REFLEX MICROSCOPIC
Glucose, UA: NEGATIVE mg/dL
Protein, ur: NEGATIVE mg/dL
Specific Gravity, Urine: 1.018 (ref 1.005–1.030)

## 2010-11-06 LAB — CBC
MCV: 86.1 fL (ref 78.0–100.0)
Platelets: 197 10*3/uL (ref 150–400)
RBC: 4.38 MIL/uL (ref 3.87–5.11)
RDW: 12.8 % (ref 11.5–15.5)
WBC: 6.7 10*3/uL (ref 4.0–10.5)

## 2010-11-06 LAB — HEMOGLOBIN A1C
Hgb A1c MFr Bld: 5.7 % — ABNORMAL HIGH (ref <5.7)
Mean Plasma Glucose: 117 mg/dL — ABNORMAL HIGH (ref <117)

## 2010-11-06 LAB — RAPID URINE DRUG SCREEN, HOSP PERFORMED
Amphetamines: NOT DETECTED
Benzodiazepines: NOT DETECTED

## 2010-11-06 LAB — CK TOTAL AND CKMB (NOT AT ARMC)
CK, MB: 1.7 ng/mL (ref 0.3–4.0)
Relative Index: 1.5 (ref 0.0–2.5)
Total CK: 111 U/L (ref 7–177)

## 2010-11-25 ENCOUNTER — Ambulatory Visit: Payer: Self-pay | Admitting: Oncology

## 2010-11-28 LAB — CANCER ANTIGEN 27.29: CA 27.29: 25 U/mL (ref 0.0–38.6)

## 2010-12-25 ENCOUNTER — Ambulatory Visit: Payer: Self-pay | Admitting: Oncology

## 2011-01-08 NOTE — Assessment & Plan Note (Signed)
[facility]                         ELECTROPHYSIOLOGY OFFICE NOTE   NAME:[name], Kelsey                            MRN:          [id number]  DATE:03/20/2007                            DOB:          1950-09-09    [name] was seen today in the [place] clinic on March 20, 2007 for  followup of her Guidant model 1296, and her date of implant was  May 13, 2003 for a profound first-degree heart block with  congestive symptoms.   On interrogation of her device today, her battery voltage is good, about  50% remaining.  T waves measured 1.8 mV with an atrial capture threshold  of 1.1 volt at 0.4 ms and an atrial lead impedance of 380 ohm.  R waves  measured greater than 12 mV with a ventricular pacing threshold of 0.7  volts at 0.4 ms and a ventricular lead impedance of 430 ohm.   There were 7 mode switch episodes totaling 0% of the time.  She is not  on Coumadin therapy.  I did decrease her atrial sensitivity to 0.5 mV.  No other changes were made in her parameters.  She will be seen again in  six months time.      Altha Harm, LPN  Electronically Signed      Duke Salvia, MD, [facility]  Electronically Signed   PO/MedQ  DD: 03/20/2007  DT: 03/21/2007  Job #: [id number]

## 2011-01-08 NOTE — Progress Notes (Signed)
[facility]                  [facility] OFFICE NOTE   NAME:Denk, Hazely                            MRN:          [id number]  DATE:01/18/2009                            DOB:          Oct 14, 1950    [name] is seen in followup for pacemaker implanted for exercise  tolerance and profound first-degree AV block.  There has been gradual,  but persistent improvement.   She continues to undergo chemotherapy.  Her MUGA in the fall which  demonstrated normal LV function.  She has another MUGA pending.   Her other major complaint is fatigue.  It is not clear whether this is  related to her beta-blocker or whether it is related to chemotherapies.   She also has a resting tachycardia which has been persistent since we  saw her in the fall (see below).   PHYSICAL EXAMINATION:  VITAL SIGNS:  Today, her blood pressure was  157/91, her pulse was 98, weight was 227 which is up just a few pounds.  NECK:  Her neck veins were flat.  LUNGS:  Her lungs were clear.  HEART:  Sounds were rapid, but regular.  ABDOMEN:  Soft.  EXTREMITIES:  Had no edema.   Interrogation of her Guidant pulse generator demonstrated normal device  function with 100% atrial sensing and 100% ventricular pacing.  Her  atrial impedance was 380.  The ventricular impedance was 430.  Threshold  was 1 at 0.4 and 8.7 at 0.4 and V with an amplitude of 1.4/10.4.  Heart  rate excursion was right shifted.  Her median heart rate is in the mid  90s.   IMPRESSION:  1. High-grade first-degree atrioventricular block with associated      exercise intolerance.  2. Status post pacer for the above.  3. Resting sinus tachycardia.  4. Ongoing therapy for breast cancer.  5. Fatigue.   As related to the fatigue, I do not know whether this is related to  metoprolol or not.  The reason I think it may be is because it was down  titrated from 100 b.i.d. to 50 b.i.d. because of significant  symptoms to  that end and because of her sinus tachycardia and hypertension.  We will  plan to seek an alternative beta-blocker.  I have given her  prescriptions for atenolol 50, metoprolol succinate 50, and nadolol 40.  She is to take each for 2 weeks and she is to let us know which  of these she tolerates best.  At that point, we will fill it and up  titrate it with a goal that of trying to get her median heart rate down  into the 80s.   We will see her again in 3 months to assess.     Duke Salvia, MD, [facility]  Electronically Signed    SCK/MedQ  DD: 01/18/2009  DT: 01/19/2009  Job #: [id number]

## 2011-01-08 NOTE — Progress Notes (Signed)
[name]                  [facility] OFFICE NOTE   NAME:[name], Kelsey                            MRN:          [id number]  DATE:07/18/2008                            DOB:          08/08/1951    [name] is seen in followup for a pacemaker implanted for significant  exercise intolerance and profound first-degree AV block and continues to  have improved exercise tolerance.   She has intercurrently been diagnosed with breast cancer, has undergone  chemotherapy and radiation therapy and has also been re-exposed to her  Ceftin, which resulted in some impairment in LV systolic function.  This  prompted a hiatus in the therapy; it is scheduled to resume today.   We will plan to get the MUGA results from [facility].   She is on an ACE inhibitor and for benazepril and she also has had  metoprolol started at 50 mg twice daily.  Other medications included  Zantac and Aleve.   PHYSICAL EXAMINATION:  VITAL SIGNS:  Today, her blood pressure was  123/83 with a pulse of 75.  Her weight was 223, which is stable over the  last number of years.  LUNGS:  Clear.  HEART:  Sounds were regular.  ABDOMEN:  Soft.  EXTREMITIES:  Had no edema.   Interrogation of her Guidant pulse generator demonstrates a P-wave of  1.2 with impedance of 400, threshold of 0.8 at 0.4.  The R-wave was 9  with impedance of 430, threshold of 0.7 at 0.4.  She is 99%  ventricularly paced.   IMPRESSION:  1. High-grade heart block.  2. Status post pacer for the above.  3. 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  4. Ongoing therapy for breast cancer.   We will plan to get a copy of the MUGA results from [facility].  We may want  to consider changing her beta-blocker to a long-acting drug with more  day for cardiomyopathy, but for right now we will leave it as it is.   We will see her again in 6 months' time.  I should note that her battery  has approximate longevity of 1.5 years.     Duke Salvia, MD, [facility]  Electronically Signed    SCK/MedQ  DD: 07/18/2008  DT: 07/19/2008  Job #: [id number]   cc:   Steele Sizer, MD

## 2011-01-11 NOTE — Assessment & Plan Note (Signed)
[facility]                           ELECTROPHYSIOLOGY OFFICE NOTE   NAME:[name], Kelsey                            MRN:          [id number]  DATE:05/01/2006                            DOB:          07-18-1951    The patient was seen in the clinic today on the 6th of September, 2007, for  followup of her Guidant Model [phone] inter-DR.  Date of implant was 05/13/03  for first degree heart block with congestive symptoms.   On interrogation of her device today, her battery voltage is at beginning of  life.  P wave is measured 2 mV with an atrial capture threshold of 0.7 volts  at 0.4 msec and an atrial lead impedance of 420.  R wave is measured at 13.7  V with a ventricular pacing threshold of 0.7 volts at 0.4 msec and a  ventricular lead impedance of 460.   No changes were made in her parameters.  She will return again in six months  time.                                   Altha Harm, LPN                                Duke Salvia, MD, [facility]   PO/MedQ  DD:  05/01/2006  DT:  05/01/2006  Job #:  [id number]

## 2011-01-11 NOTE — Op Note (Signed)
[name], [name]                               ACCOUNT NO.:  [id number]   MEDICAL RECORD NO.:  [id number]                   PATIENT TYPE:  OIB   LOCATION:  4728                                 FACILITY:  [facility]   PHYSICIAN:  Duke Salvia, M.D.               DATE OF BIRTH:  Jan 18, 1951   DATE OF PROCEDURE:  05/13/2003  DATE OF DISCHARGE:                                 OPERATIVE REPORT   PREOPERATIVE DIAGNOSIS:  Profound first-degree atrioventricular block with  congestive symptoms.   POSTOPERATIVE DIAGNOSIS:  Profound first-degree atrioventricular block with  congestive symptoms.   PROCEDURE:  Dual-chamber placement implantation.   After properly obtaining informed consent, the patient was brought to the  cardiac catheterization laboratory and placed on the fluoroscopic table in  supine position. After routine prep and drape of the left upper chest,  intravenous contrast was injected via the left antecubital vein to identify  the course and the patency of the left thoracic and left subclavian vein.  With this having been accomplished, lidocaine was infiltrated in the  prepectoral subclavicular region. An incision was made and carried down  through the layer of prepectoral fascia using electrocautery and sharp  dissection. A pocket was formed similarly. Hemostasis was obtained.   Thereafter, attention was turned to gain access to the left thoracic, left  subclavian vein which was accomplished without difficulty. However, one time  I did puncture the artery. Pressure was held.   Two separate venipunctures were accomplished and a 0 silk suture was placed  in a figure-of-eight fashion and allowed to hang loosely.   Sequentially, tear-away 7-French hemostatic introducer sheaths were placed  through which were passed sequentially a Medtronic 5076, 52 cm active  fixation ventricular lead, serial #[id number], and a Guidant Model 4479, 45  cm active fixation atrial lead, serial  #[id number]. Under fluoroscopic guidance,  these were manipulated to the right ventricular apex where the bipolar R-  wave was 10 mV with a base impedance of 0.5 V at 0.5 msec with a current at  5 V at 0.9 MA, and an impedance of 696 ohms. There was no diaphragmatic  pacing at 10 V.   The bipolar P-wave was 3 mV with a pace impedance of 0.3 V at 0.5 msec.  Current threshold was 0.9 MA and impedance at 5 V with 339 ohms. With these  acceptable parameters recorded, the leads were secured to the prepectoral  fascia and attached to a Guidant Entra-DR pulse generator model [id number],  Serial [id number]. P-synchronous pacing was identified. The pocket was  copiously irrigated with antibiotic-containing saline solution and the leads  in the pulse generator were then placed in the pocket, secured to the  prepectoral fascia. The wound was closed in three  layers in the normal fashion. The wound was washed, dried, and a  Benzoin/Steri-Strip dressing was applied. Needle counts, sponge counts, and  instrument counts were correct at the end of the procedure, according to the  staff.   The patient tolerated the procedure without apparent complication.                                               Duke Salvia, M.D.    SCK/MEDQ  D:  05/13/2003  T:  05/14/2003  Job:  [id number]   cc:   Dr. Leda Gauze, [facility]   [facility]

## 2011-01-11 NOTE — Discharge Summary (Signed)
[name], [name]                               ACCOUNT NO.:  [id number]   MEDICAL RECORD NO.:  [id number]                   PATIENT TYPE:  OIB   LOCATION:  4728                                 FACILITY:  [facility]   PHYSICIAN:  Duke Salvia, M.D.               DATE OF BIRTH:  Jun 24, 1951   DATE OF ADMISSION:  05/13/2003  DATE OF DISCHARGE:                           DISCHARGE SUMMARY - REFERRING   ANTICIPATED DATE OF DISCHARGE:  May 14, 2003.   BRIEF HISTORY:  This is a 60 year old female who was seen by Duke Salvia, M.D. on April 24, 2002.  She has a history of symptomatic first-  degree AV block with PR intervals that are associated with simultaneous  aortic valve contractions.  She continues to be plagued with problems of  fatigue and exercise intolerance.  Attempts were made to treat the patient  medically without success.  A Holter monitor was performed that showed very  little change in R-R intervals although the PR interval is progressively  prolonged to the point they were as long as the R-R intervals resulting in  the simultaneous AV contraction as noted.  The overall mean heart rate was  90.  A decision was made to admit the patient for placement of a permanent  pacemaker.   PAST MEDICAL HISTORY:  Significant for hypertension.  The patient is status  post C-section, status post cholecystectomy.   ALLERGIES:  The patient ALLERGIC to PENICILLIN which causes anaphylactic  shock.   MEDICATIONS PRIOR TO ADMISSION:  Included Effexor, Celebrex, Allegra,  metoprolol, and Imitrex.   HOSPITAL COURSE:  As noted, this patient was admitted to [facility]  for placement of a permanent pacemaker.  This was performed on September  17th by Duke Salvia, M.D. using a Guidant device.  Please refer to his  dictation for full details.  The patient tolerated this well.  A chest x-ray  is currently pending.  If the chest x-ray is okay, the patient will be  discharged  later today.   LABORATORY DATA:  A basic metabolic panel on the 17th was within normal  limits except for a glucose of 103 and INR was 0.9.  A CBC was within normal  limits.  As noted the chest x-ray is pending.   MEDICATIONS:  The patient to continue to take the medication she was on  prior to admission which includes Naprosyn 500 mg b.i.d., Effexor XR 150 mg  daily, and metoprolol 100 mg b.i.d.  She was told to take Tylenol as needed  for pain.   Her activity was to be as instructed by Duke Salvia, M.D.  She was to be  on a low salt, low fat diet.  She was to be seen in the [facility]  October 4th at 9 a.m.  She was to seen Dr. Graciela Husbands January 12th at  2 p.m.   PROBLEM LIST AT THE TIME OF DISCHARGE:  1. Symptomatic first-degree atrioventricular block.  2. Status post permanent pacemaker.  3. History of hypertension.  4.     History of depression.  5. PENICILLIN ALLERGY with anaphylaxis in the past.   ADDENDUM:  As noted, the patient will be discharged provided her chest x-ray  is unremarkable.      Delton See, P.A. [facility]                  Duke Salvia, M.D.    DR/MEDQ  D:  05/14/2003  T:  05/15/2003  Job:  [id number]   cc:   Vonita Moss, M.D.   Sebastian Ache, M.D.  [facility]

## 2011-01-18 ENCOUNTER — Telehealth: Payer: Self-pay | Admitting: Internal Medicine

## 2011-01-18 NOTE — Telephone Encounter (Signed)
LMOM to call and schedule a f/u with Graciela Husbands.

## 2011-01-25 ENCOUNTER — Ambulatory Visit: Payer: Self-pay | Admitting: Oncology

## 2011-02-01 ENCOUNTER — Encounter: Payer: Self-pay | Admitting: Cardiovascular Disease

## 2011-02-26 ENCOUNTER — Ambulatory Visit: Payer: Self-pay | Admitting: Oncology

## 2011-03-19 ENCOUNTER — Encounter: Payer: Self-pay | Admitting: Internal Medicine

## 2011-03-19 ENCOUNTER — Ambulatory Visit (INDEPENDENT_AMBULATORY_CARE_PROVIDER_SITE_OTHER): Payer: BC Managed Care – PPO | Admitting: Internal Medicine

## 2011-03-19 DIAGNOSIS — R000 Tachycardia, unspecified: Secondary | ICD-10-CM | POA: Insufficient documentation

## 2011-03-19 DIAGNOSIS — I498 Other specified cardiac arrhythmias: Secondary | ICD-10-CM

## 2011-03-19 DIAGNOSIS — I429 Cardiomyopathy, unspecified: Secondary | ICD-10-CM

## 2011-03-19 DIAGNOSIS — I440 Atrioventricular block, first degree: Secondary | ICD-10-CM

## 2011-03-19 DIAGNOSIS — Z950 Presence of cardiac pacemaker: Secondary | ICD-10-CM

## 2011-03-19 NOTE — Progress Notes (Signed)
  HPI  [name] is a 60 y.o. female een in followup for high-grade first degree AV block associated with exercise intolerance for which she underwent pacing.  She underwent generator replacement Dec 2011 Echo 06/2010 normal LV function  She has been doing relatively well  She has retired from teaching   She also has a history of breast cancer for which she underwent chemotherapy which has been complicated most recently by hypomagnesemia requiring oral and intravenous for sedation.  Past Medical History  Diagnosis Date  . Cardiomyopathy secondary   . AV block, 1st degree   . Pacemaker     Past Surgical History  Procedure Date  . Cesarean section 1983  . Gallbladder surgery 1993  . Pacemaker insertion 05/13/2003    Neoma Laming - DR    Current Outpatient Prescriptions  Medication Sig Dispense Refill  . AMILORIDE HCL PO Take by mouth as directed.        Marland Kitchen atenolol (TENORMIN) 50 MG tablet Take 50 mg by mouth daily.        . benazepril (LOTENSIN) 10 MG tablet Take 10 mg by mouth daily.        . fluticasone (FLONASE) 50 MCG/ACT nasal spray Place 2 sprays into the nose as needed.       . magnesium chloride (SLOW-MAG) 64 MG TBEC Take 2 tablets by mouth 2 (two) times daily.        . NON FORMULARY coratab as directed/ for sinus       . SUMAtriptan Succinate (IMITREX PO) Take by mouth as needed.          Allergies  Allergen Reactions  . Penicillins     Review of Systems negative except from HPI and PMH  Physical Exam Well developed and well nourished in no acute distress HENT normal x poor dentition E scleral and icterus clear Neck Supple Clear to ausculation Regular rate and rhythm, no murmurs gallops or rub Soft with active bowel sounds No clubbing cyanosis and edema Alert and oriented, grossly normal motor and sensory function Skin Warm and Dry   Assessment and  Plan

## 2011-03-19 NOTE — Patient Instructions (Signed)
Continue with current medications.  Follow up in 6-months.

## 2011-03-19 NOTE — Assessment & Plan Note (Signed)
Her HR histogram is shifted to the R  Her HR is faster still today which is being attributed to decongestants  We will have her TSH and CBC checked to make sure that no intercurrent underlying 2ndary issue

## 2011-03-19 NOTE — Assessment & Plan Note (Signed)
The patient's device was interrogated.  The information was reviewed. No changes were made in the programming.    

## 2011-03-19 NOTE — Assessment & Plan Note (Signed)
100% V-paced

## 2011-03-27 ENCOUNTER — Ambulatory Visit: Payer: Self-pay | Admitting: Oncology

## 2011-04-12 ENCOUNTER — Ambulatory Visit (INDEPENDENT_AMBULATORY_CARE_PROVIDER_SITE_OTHER): Payer: BC Managed Care – PPO | Admitting: *Deleted

## 2011-04-12 DIAGNOSIS — I498 Other specified cardiac arrhythmias: Secondary | ICD-10-CM

## 2011-04-12 DIAGNOSIS — R000 Tachycardia, unspecified: Secondary | ICD-10-CM

## 2011-04-13 LAB — CBC WITH DIFFERENTIAL/PLATELET
Basophils Relative: 0 % (ref 0–1)
Eosinophils Absolute: 0.2 10*3/uL (ref 0.0–0.7)
MCH: 29.2 pg (ref 26.0–34.0)
MCHC: 32.9 g/dL (ref 30.0–36.0)
Neutrophils Relative %: 62 % (ref 43–77)
Platelets: 209 10*3/uL (ref 150–400)
RBC: 4.48 MIL/uL (ref 3.87–5.11)

## 2011-04-15 ENCOUNTER — Telehealth: Payer: Self-pay | Admitting: *Deleted

## 2011-04-15 NOTE — Telephone Encounter (Signed)
Called pt to give her lab results, she had cbc and tsh drawn Friday for tachycardia. Results normal. Attempted to contact pt, LMOM TCB. Dr. Graciela Husbands, I don't see these results in your inbox, so just wanted to make sure you were able to review. They were drawn 04/12/11, please advise if anything else recommended. Thanks.

## 2011-04-16 NOTE — Telephone Encounter (Signed)
Last note ws on wrong patient

## 2011-04-16 NOTE — Telephone Encounter (Signed)
No need for bridging steve

## 2011-04-27 ENCOUNTER — Ambulatory Visit: Payer: Self-pay | Admitting: Oncology

## 2011-05-27 ENCOUNTER — Ambulatory Visit: Payer: Self-pay | Admitting: Oncology

## 2011-06-27 ENCOUNTER — Ambulatory Visit: Payer: Self-pay | Admitting: Oncology

## 2011-07-27 ENCOUNTER — Ambulatory Visit: Payer: Self-pay | Admitting: Oncology

## 2011-08-29 ENCOUNTER — Ambulatory Visit: Payer: Self-pay | Admitting: Oncology

## 2011-09-27 ENCOUNTER — Ambulatory Visit: Payer: Self-pay | Admitting: Oncology

## 2011-10-25 ENCOUNTER — Ambulatory Visit: Payer: Self-pay | Admitting: Oncology

## 2011-11-07 ENCOUNTER — Ambulatory Visit (INDEPENDENT_AMBULATORY_CARE_PROVIDER_SITE_OTHER): Payer: BC Managed Care – PPO | Admitting: Internal Medicine

## 2011-11-07 ENCOUNTER — Encounter: Payer: Self-pay | Admitting: Internal Medicine

## 2011-11-07 VITALS — BP 112/64 | HR 81 | Ht 65.5 in | Wt 234.5 lb

## 2011-11-07 DIAGNOSIS — R000 Tachycardia, unspecified: Secondary | ICD-10-CM

## 2011-11-07 DIAGNOSIS — I498 Other specified cardiac arrhythmias: Secondary | ICD-10-CM

## 2011-11-07 DIAGNOSIS — I440 Atrioventricular block, first degree: Secondary | ICD-10-CM

## 2011-11-07 DIAGNOSIS — Z950 Presence of cardiac pacemaker: Secondary | ICD-10-CM

## 2011-11-07 LAB — PACEMAKER DEVICE OBSERVATION
AL AMPLITUDE: 1.9 mv
AL THRESHOLD: 0.5 V
RV LEAD IMPEDENCE PM: 440 Ohm
RV LEAD THRESHOLD: 0.9 V

## 2011-11-07 NOTE — Progress Notes (Signed)
  HPI  [name] is a 61 y.o. female seen in followup for high-grade first degree AV block associated with exercise intolerance for which she underwent pacing.  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  There have been no palpitations, lightheadedness or syncope.        Past Medical History  Diagnosis Date  . Cardiomyopathy secondary   . AV block, 1st degree   . Pacemaker   . Breast ca     Chemo/XRT/lumpectomy  . 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 Sinus tachycardia     Past Surgical History  Procedure Date  . Cesarean section 1983  . Gallbladder surgery 1993  . Pacemaker insertion 05/13/2003    Neoma Laming - DR    Current Outpatient Prescriptions  Medication Sig Dispense Refill  . AMILORIDE HCL PO Take by mouth as directed.        Marland Kitchen atenolol (TENORMIN) 50 MG tablet Take 50 mg by mouth daily.        . benazepril (LOTENSIN) 10 MG tablet Take 10 mg by mouth daily.        . fluticasone (FLONASE) 50 MCG/ACT nasal spray Place 2 sprays into the nose as needed.       . magnesium chloride (SLOW-MAG) 64 MG TBEC Take 2 tablets by mouth 2 (two) times daily.        . NON FORMULARY coratab as directed/ for sinus       . SUMAtriptan Succinate (IMITREX PO) Take by mouth as needed.          Allergies  Allergen Reactions  . Penicillins     Review of Systems negative except from HPI and PMH  Physical Exam BP 112/64  Pulse 81  Ht 5' 5.5" (1.664 m)  Wt 234 lb 8 oz (106.369 kg)  BMI 38.43 kg/m2 2 Well developed and well nourished in no acute distress HENT normal E scleral and icterus clear Neck Supple JVP 7-8 with HJR 2lear to ausculation Regular rate and rhythm, no murmurs gallops or rub Soft with active bowel sounds No clubbing cyanosis  Trace Edema Alert and oriented, grossly normal motor and sensory function Skin Warm and Dry

## 2011-11-07 NOTE — Assessment & Plan Note (Signed)
Stable

## 2011-11-07 NOTE — Assessment & Plan Note (Signed)
The patient's device was interrogated.  The information was reviewed. No changes were made in the programming.    

## 2011-11-07 NOTE — Patient Instructions (Signed)
Your physician recommends that you schedule a follow-up appointment in: 6 MONTH WITH PAULA FOR DEVICE CHECK  NO CHANGES TODAY

## 2011-11-12 LAB — MAGNESIUM: Magnesium: 1.6 mg/dL — ABNORMAL LOW

## 2011-11-25 ENCOUNTER — Ambulatory Visit: Payer: Self-pay | Admitting: Oncology

## 2011-12-25 ENCOUNTER — Ambulatory Visit: Payer: Self-pay | Admitting: Oncology

## 2012-02-04 ENCOUNTER — Ambulatory Visit: Payer: Self-pay | Admitting: Oncology

## 2012-02-04 LAB — MAGNESIUM: Magnesium: 1.2 mg/dL — ABNORMAL LOW

## 2012-02-24 ENCOUNTER — Ambulatory Visit: Payer: Self-pay | Admitting: Oncology

## 2012-03-26 ENCOUNTER — Ambulatory Visit: Payer: Self-pay | Admitting: Oncology

## 2012-04-28 ENCOUNTER — Ambulatory Visit: Payer: Self-pay | Admitting: Oncology

## 2012-04-28 LAB — MAGNESIUM: Magnesium: 1.5 mg/dL — ABNORMAL LOW

## 2012-05-26 ENCOUNTER — Ambulatory Visit: Payer: Self-pay | Admitting: Oncology

## 2012-06-23 LAB — MAGNESIUM: Magnesium: 1.3 mg/dL — ABNORMAL LOW

## 2012-06-26 ENCOUNTER — Ambulatory Visit: Payer: Self-pay | Admitting: Oncology

## 2012-07-31 ENCOUNTER — Encounter: Payer: Self-pay | Admitting: *Deleted

## 2012-08-04 ENCOUNTER — Ambulatory Visit: Payer: Self-pay | Admitting: Oncology

## 2012-08-04 ENCOUNTER — Ambulatory Visit (INDEPENDENT_AMBULATORY_CARE_PROVIDER_SITE_OTHER): Payer: BC Managed Care – PPO | Admitting: Internal Medicine

## 2012-08-04 ENCOUNTER — Encounter: Payer: Self-pay | Admitting: Internal Medicine

## 2012-08-04 VITALS — BP 112/80 | HR 88 | Ht 65.5 in | Wt 231.2 lb

## 2012-08-04 DIAGNOSIS — I498 Other specified cardiac arrhythmias: Secondary | ICD-10-CM

## 2012-08-04 DIAGNOSIS — R000 Tachycardia, unspecified: Secondary | ICD-10-CM

## 2012-08-04 DIAGNOSIS — Z950 Presence of cardiac pacemaker: Secondary | ICD-10-CM

## 2012-08-04 DIAGNOSIS — I440 Atrioventricular block, first degree: Secondary | ICD-10-CM

## 2012-08-04 LAB — PACEMAKER DEVICE OBSERVATION
AL IMPEDENCE PM: 500 Ohm
AL THRESHOLD: 0.5 V
RV LEAD AMPLITUDE: 12 mv
RV LEAD IMPEDENCE PM: 440 Ohm

## 2012-08-04 LAB — MAGNESIUM: Magnesium: 1.5 mg/dL — ABNORMAL LOW

## 2012-08-04 NOTE — Progress Notes (Signed)
  HPI  [name] is a 61 y.o. female seen in followup for high-grade first degree AV block associated with exercise intolerance for which she underwent pacing.  She underwent generator replacement Dec 2011  Echo 06/2010 normal LV function   She is much better since pacemaker was implanted       Past Medical History  Diagnosis Date  . Cardiomyopathy secondary   . AV block, 1st degree   . Pacemaker   . Breast CA     Chemo/XRT/lumpectomy  . 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 Sinus tachycardia   . Hypertension     Past Surgical History  Procedure Date  . Cesarean section 1983  . Gallbladder surgery 1993  . Pacemaker insertion 05/13/2003    [employer] - DR  . Insert / replace / remove pacemaker 2011    revision @ [facility]  . Breast lumpectomy     left breast    Current Outpatient Prescriptions  Medication Sig Dispense Refill  . atenolol (TENORMIN) 50 MG tablet Take 50 mg by mouth daily.        . fluticasone (FLONASE) 50 MCG/ACT nasal spray Place 2 sprays into the nose as needed.       . magnesium oxide (MAG-OX) 400 MG tablet Take 400 mg by mouth 2 (two) times daily.      . naproxen sodium (ANAPROX) 220 MG tablet Take 220 mg by mouth 2 (two) times daily with a meal.      . NON FORMULARY chloratab as directed/ for sinus      . SUMAtriptan Succinate (IMITREX PO) Take by mouth as needed.        . [DISCONTINUED] AMILORIDE HCL PO Take by mouth as directed.          Allergies  Allergen Reactions  . Penicillins     Review of Systems negative except from HPI and PMH  Physical Exam BP 112/80  Pulse 88  Ht 5' 5.5" (1.664 m)  Wt 231 lb 4 oz (104.894 kg)  BMI 37.90 kg/m2 2 Well developed and well nourished in no acute distress HENT normal E scleral and icterus clear  Regular rate and rhythm, no murmurs gallops or rub Soft with active bowel sounds No clubbing cyanosis Tno edema Alert and oriented, grossly normal motor and sensory  function Skin Warm and Dry   AI VR with retrograde P waves

## 2012-08-04 NOTE — Assessment & Plan Note (Signed)
Much improved post pacing with a 90% p synchronous pacing

## 2012-08-04 NOTE — Patient Instructions (Addendum)
Your physician wants you to follow-up in: 6 months with device clinic and 1 year with Dr. Klein. You will receive a reminder letter in the mail two months in advance. If you don't receive a letter, please call our office to schedule the follow-up appointment.  

## 2012-08-04 NOTE — Assessment & Plan Note (Signed)
The patient's device was interrogated.  The information was reviewed. No changes were made in the programming.    

## 2012-08-26 ENCOUNTER — Ambulatory Visit: Payer: Self-pay | Admitting: Oncology

## 2012-09-26 ENCOUNTER — Ambulatory Visit: Payer: Self-pay | Admitting: Oncology

## 2012-10-24 ENCOUNTER — Ambulatory Visit: Payer: Self-pay | Admitting: Oncology

## 2012-11-14 LAB — CANCER ANTIGEN 27.29: CA 27.29: 32.2 U/mL (ref 0.0–38.6)

## 2012-11-24 ENCOUNTER — Ambulatory Visit: Payer: Self-pay | Admitting: Oncology

## 2012-12-24 ENCOUNTER — Ambulatory Visit: Payer: Self-pay | Admitting: Oncology

## 2013-01-25 ENCOUNTER — Ambulatory Visit: Payer: Self-pay | Admitting: Oncology

## 2013-02-23 ENCOUNTER — Ambulatory Visit: Payer: Self-pay | Admitting: Oncology

## 2013-03-04 ENCOUNTER — Encounter: Payer: Self-pay | Admitting: Internal Medicine

## 2013-03-04 ENCOUNTER — Ambulatory Visit (INDEPENDENT_AMBULATORY_CARE_PROVIDER_SITE_OTHER): Payer: BC Managed Care – PPO | Admitting: *Deleted

## 2013-03-04 DIAGNOSIS — I498 Other specified cardiac arrhythmias: Secondary | ICD-10-CM

## 2013-03-04 DIAGNOSIS — I440 Atrioventricular block, first degree: Secondary | ICD-10-CM

## 2013-03-04 DIAGNOSIS — R000 Tachycardia, unspecified: Secondary | ICD-10-CM

## 2013-03-04 LAB — PACEMAKER DEVICE OBSERVATION
AL IMPEDENCE PM: 470 Ohm
RV LEAD AMPLITUDE: 12 mv
VENTRICULAR PACING PM: 95

## 2013-03-04 NOTE — Progress Notes (Signed)
PPM check in office. 

## 2013-03-26 ENCOUNTER — Ambulatory Visit: Payer: Self-pay | Admitting: Oncology

## 2013-04-12 LAB — MAGNESIUM: Magnesium: 1.4 mg/dL — ABNORMAL LOW

## 2013-04-26 ENCOUNTER — Ambulatory Visit: Payer: Self-pay | Admitting: Oncology

## 2013-05-24 LAB — MAGNESIUM: Magnesium: 1.5 mg/dL — ABNORMAL LOW

## 2013-05-24 LAB — CBC CANCER CENTER
Basophil #: 0 x10 3/mm (ref 0.0–0.1)
Basophil %: 0.7 %
Eosinophil #: 0.2 x10 3/mm (ref 0.0–0.7)
HCT: 39.7 % (ref 35.0–47.0)
Lymphocyte #: 1.7 x10 3/mm (ref 1.0–3.6)
Lymphocyte %: 30.2 %
MCH: 30.3 pg (ref 26.0–34.0)
MCV: 88 fL (ref 80–100)
Monocyte %: 7.8 %
Neutrophil #: 3.4 x10 3/mm (ref 1.4–6.5)
Neutrophil %: 58.7 %
Platelet: 189 x10 3/mm (ref 150–440)
RBC: 4.54 10*6/uL (ref 3.80–5.20)
RDW: 13 % (ref 11.5–14.5)
WBC: 5.8 x10 3/mm (ref 3.6–11.0)

## 2013-05-26 ENCOUNTER — Ambulatory Visit: Payer: Self-pay | Admitting: Oncology

## 2013-06-21 LAB — MAGNESIUM: Magnesium: 1.4 mg/dL — ABNORMAL LOW

## 2013-06-26 ENCOUNTER — Ambulatory Visit: Payer: Self-pay | Admitting: Oncology

## 2013-07-01 ENCOUNTER — Other Ambulatory Visit: Payer: Self-pay

## 2013-08-02 ENCOUNTER — Ambulatory Visit: Payer: Self-pay | Admitting: Oncology

## 2013-08-02 LAB — MAGNESIUM: Magnesium: 1.4 mg/dL — ABNORMAL LOW

## 2013-08-26 ENCOUNTER — Ambulatory Visit: Payer: Self-pay | Admitting: Oncology

## 2013-09-02 ENCOUNTER — Encounter: Payer: Self-pay | Admitting: *Deleted

## 2013-09-14 LAB — MAGNESIUM: Magnesium: 1.4 mg/dL — ABNORMAL LOW

## 2013-09-21 ENCOUNTER — Encounter: Payer: Self-pay | Admitting: Internal Medicine

## 2013-09-21 ENCOUNTER — Ambulatory Visit (INDEPENDENT_AMBULATORY_CARE_PROVIDER_SITE_OTHER): Payer: BC Managed Care – PPO | Admitting: Internal Medicine

## 2013-09-21 ENCOUNTER — Encounter (INDEPENDENT_AMBULATORY_CARE_PROVIDER_SITE_OTHER): Payer: BC Managed Care – PPO

## 2013-09-21 VITALS — BP 119/72 | HR 81 | Ht 65.5 in | Wt 237.0 lb

## 2013-09-21 DIAGNOSIS — I498 Other specified cardiac arrhythmias: Secondary | ICD-10-CM

## 2013-09-21 DIAGNOSIS — R5381 Other malaise: Secondary | ICD-10-CM

## 2013-09-21 DIAGNOSIS — Z950 Presence of cardiac pacemaker: Secondary | ICD-10-CM

## 2013-09-21 DIAGNOSIS — I440 Atrioventricular block, first degree: Secondary | ICD-10-CM

## 2013-09-21 DIAGNOSIS — R000 Tachycardia, unspecified: Secondary | ICD-10-CM

## 2013-09-21 DIAGNOSIS — R600 Localized edema: Secondary | ICD-10-CM

## 2013-09-21 DIAGNOSIS — R0609 Other forms of dyspnea: Secondary | ICD-10-CM

## 2013-09-21 DIAGNOSIS — R0989 Other specified symptoms and signs involving the circulatory and respiratory systems: Secondary | ICD-10-CM

## 2013-09-21 DIAGNOSIS — R609 Edema, unspecified: Secondary | ICD-10-CM

## 2013-09-21 DIAGNOSIS — R5383 Other fatigue: Secondary | ICD-10-CM | POA: Insufficient documentation

## 2013-09-21 DIAGNOSIS — G4710 Hypersomnia, unspecified: Secondary | ICD-10-CM

## 2013-09-21 HISTORY — DX: Localized edema: R60.0

## 2013-09-21 HISTORY — DX: Hypersomnia, unspecified: G47.10

## 2013-09-21 LAB — MDC_IDC_ENUM_SESS_TYPE_INCLINIC
Brady Statistic RA Percent Paced: 0 %
Date Time Interrogation Session: 20150127050000
Lead Channel Impedance Value: 480 Ohm
Lead Channel Pacing Threshold Amplitude: 0.4 V
Lead Channel Sensing Intrinsic Amplitude: 1.7 mV
Lead Channel Setting Pacing Amplitude: 2.5 V
Lead Channel Setting Pacing Amplitude: 2.5 V
Lead Channel Setting Pacing Pulse Width: 0.4 ms
MDC IDC MSMT LEADCHNL RA PACING THRESHOLD PULSEWIDTH: 0.4 ms
MDC IDC MSMT LEADCHNL RV IMPEDANCE VALUE: 460 Ohm
MDC IDC MSMT LEADCHNL RV PACING THRESHOLD AMPLITUDE: 0.9 V
MDC IDC MSMT LEADCHNL RV PACING THRESHOLD PULSEWIDTH: 0.4 ms
MDC IDC MSMT LEADCHNL RV SENSING INTR AMPL: 11.6 mV
MDC IDC PG SERIAL: 595359
MDC IDC SET LEADCHNL RV SENSING SENSITIVITY: 2.5 mV
MDC IDC STAT BRADY RV PERCENT PACED: 96 %

## 2013-09-21 NOTE — Assessment & Plan Note (Signed)
The patient's device was interrogated.  The information was reviewed. No changes were made in the programming.    

## 2013-09-21 NOTE — Patient Instructions (Signed)
Your physician has recommended that you have a sleep study. This test records several body functions during sleep, including: brain activity, eye movement, oxygen and carbon dioxide blood levels, heart rate and rhythm, breathing rate and rhythm, the flow of air through your mouth and nose, snoring, body muscle movements, and chest and belly movement. You should hear from them within 7 business days. If you do not please call us.    Your physician has requested that you have a lower or upper extremity venous duplex. This test is an ultrasound of the veins in the legs or arms. It looks at venous blood flow that carries blood from the heart to the legs or arms. Allow one hour for a Lower Venous exam. Allow thirty minutes for an Upper Venous exam. There are no restrictions or special instructions.

## 2013-09-21 NOTE — Assessment & Plan Note (Signed)
Relatively stable following pacemaker implantation

## 2013-09-21 NOTE — Assessment & Plan Note (Signed)
Stable post pacing 

## 2013-09-21 NOTE — Assessment & Plan Note (Signed)
Significant issue with an Epworth score of 11. We'll undertake a sleep study

## 2013-09-21 NOTE — Assessment & Plan Note (Signed)
As above May also be related to chemotherapy

## 2013-09-21 NOTE — Assessment & Plan Note (Signed)
Will obtain venous Dopplers. She has a history of fractures in his legs. Need to rule out DVT

## 2013-09-21 NOTE — Progress Notes (Signed)
skf      Patient Care Team: Golden Pop, MD as PCP - General (Unknown Physician Specialty)   HPI  [name] is a 63 y.o. female seen in followup for high-grade first degree AV block associated with exercise intolerance for which she underwent pacing.  She underwent generator replacement Dec 2011  Echo 06/2010 normal LV function   She has some swelling in her left foot. She has never had Dopplers.  sHe has significant fatigue and daytime sleepiness  She does not know whether she snores. Epworth score today is 11 She is much better since pacemaker was implanted   Past Medical History  Diagnosis Date  . Cardiomyopathy secondary   . AV block, 1st degree   . Pacemaker   . Breast CA     Chemo/XRT/lumpectomy  . 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 Sinus tachycardia   . Hypertension     Past Surgical History  Procedure Laterality Date  . Cesarean section  1983  . Gallbladder surgery  1993  . Pacemaker insertion  05/13/2003    [employer] - DR  . Insert / replace / remove pacemaker  2011    revision @ [facility]  . Breast lumpectomy      left breast    Current Outpatient Prescriptions  Medication Sig Dispense Refill  . atenolol (TENORMIN) 50 MG tablet Take 50 mg by mouth daily.        . fluticasone (FLONASE) 50 MCG/ACT nasal spray Place 2 sprays into the nose daily.       . magnesium oxide (MAG-OX) 400 MG tablet Take 400 mg by mouth 2 (two) times daily.      . naproxen sodium (ANAPROX) 220 MG tablet Take 220 mg by mouth 2 (two) times daily with a meal.      . NON FORMULARY chloratab as directed/ for sinus      . ranitidine (ZANTAC) 150 MG capsule Take 150 mg by mouth 2 (two) times daily.      . SUMAtriptan Succinate (IMITREX PO) Take by mouth as needed.        . [DISCONTINUED] AMILORIDE HCL PO Take by mouth as directed.         No current facility-administered medications for this visit.    Allergies  Allergen Reactions  . Penicillins      Review of Systems negative except from HPI and PMH  Physical Exam BP 119/72  Pulse 81  Ht 5' 5.5" (1.664 m)  Wt 237 lb (107.502 kg)  BMI 38.82 kg/m2 Well developed and nourished in no acute distress HENT normal Neck supple with JVP-flat Clear Regular rate and rhythm, no murmurs or gallops Abd-soft with active BS No Clubbing cyanosis some left lwer extremity edema Skin-warm and dry A & Oriented  Grossly normal sensory and motor function Device pocket well healed; without hematoma or erythema.  There is no tethering   ECG demonstrates P. Synchronous pacing  Assessment and  Plan

## 2013-09-26 ENCOUNTER — Ambulatory Visit: Payer: Self-pay | Admitting: Oncology

## 2013-09-29 ENCOUNTER — Telehealth: Payer: Self-pay | Admitting: *Deleted

## 2013-09-29 NOTE — Telephone Encounter (Signed)
[facility] faxed patient appt slip. Patient on for 10/21/13

## 2013-10-26 ENCOUNTER — Ambulatory Visit: Payer: Self-pay | Admitting: Oncology

## 2013-10-26 LAB — MAGNESIUM: Magnesium: 1.8 mg/dL

## 2013-11-01 ENCOUNTER — Ambulatory Visit: Payer: Self-pay | Admitting: Internal Medicine

## 2013-11-15 ENCOUNTER — Telehealth: Payer: Self-pay

## 2013-11-15 NOTE — Telephone Encounter (Signed)
Patient would like her results from the sleep study she had on November 01, 2013 at [facility].

## 2013-11-18 NOTE — Telephone Encounter (Signed)
i dont see it in epic

## 2013-11-18 NOTE — Telephone Encounter (Signed)
Go to the "Chart Review Tab". Then the tab labeled "Media". It should be the first thing listed under that tab.

## 2013-11-19 ENCOUNTER — Ambulatory Visit: Payer: Self-pay | Admitting: Oncology

## 2013-11-22 ENCOUNTER — Encounter: Payer: Self-pay | Admitting: Internal Medicine

## 2013-11-24 ENCOUNTER — Ambulatory Visit: Payer: Self-pay | Admitting: Oncology

## 2013-11-25 LAB — MAGNESIUM: Magnesium: 1.4 mg/dL — ABNORMAL LOW

## 2013-11-25 NOTE — Telephone Encounter (Signed)
Informed patient that per Dr. Caryl Comes her sleep study was negative  Patient verbalized understanding

## 2013-12-24 ENCOUNTER — Ambulatory Visit: Payer: Self-pay | Admitting: Oncology

## 2014-01-06 LAB — MAGNESIUM: MAGNESIUM: 1.7 mg/dL — AB

## 2014-01-24 ENCOUNTER — Ambulatory Visit: Payer: Self-pay | Admitting: Oncology

## 2014-02-08 ENCOUNTER — Ambulatory Visit: Payer: Self-pay | Admitting: Family Medicine

## 2014-02-17 LAB — MAGNESIUM: Magnesium: 1.5 mg/dL — ABNORMAL LOW

## 2014-02-23 ENCOUNTER — Ambulatory Visit: Payer: Self-pay | Admitting: Oncology

## 2014-02-23 ENCOUNTER — Ambulatory Visit: Payer: Self-pay | Admitting: Family Medicine

## 2014-03-11 ENCOUNTER — Ambulatory Visit: Payer: Self-pay

## 2014-03-26 ENCOUNTER — Ambulatory Visit: Payer: Self-pay | Admitting: Family Medicine

## 2014-03-31 ENCOUNTER — Ambulatory Visit: Payer: Self-pay | Admitting: Oncology

## 2014-03-31 LAB — MAGNESIUM: Magnesium: 1.5 mg/dL — ABNORMAL LOW

## 2014-04-08 ENCOUNTER — Encounter: Payer: Self-pay | Admitting: *Deleted

## 2014-04-26 ENCOUNTER — Ambulatory Visit: Payer: Self-pay | Admitting: Oncology

## 2014-05-19 ENCOUNTER — Ambulatory Visit (INDEPENDENT_AMBULATORY_CARE_PROVIDER_SITE_OTHER): Payer: BC Managed Care – PPO | Admitting: *Deleted

## 2014-05-19 DIAGNOSIS — I440 Atrioventricular block, first degree: Secondary | ICD-10-CM

## 2014-05-19 LAB — MDC_IDC_ENUM_SESS_TYPE_INCLINIC
Brady Statistic RA Percent Paced: 0 %
Brady Statistic RV Percent Paced: 98 %
Lead Channel Impedance Value: 480 Ohm
Lead Channel Pacing Threshold Amplitude: 0.5 V
Lead Channel Sensing Intrinsic Amplitude: 2.1 mV
Lead Channel Setting Pacing Amplitude: 2.4 V
Lead Channel Setting Pacing Pulse Width: 0.4 ms
MDC IDC MSMT BATTERY REMAINING LONGEVITY: 60 mo
MDC IDC MSMT LEADCHNL RA PACING THRESHOLD PULSEWIDTH: 0.4 ms
MDC IDC MSMT LEADCHNL RV IMPEDANCE VALUE: 440 Ohm
MDC IDC MSMT LEADCHNL RV PACING THRESHOLD AMPLITUDE: 0.7 V
MDC IDC MSMT LEADCHNL RV PACING THRESHOLD PULSEWIDTH: 0.4 ms
MDC IDC MSMT LEADCHNL RV SENSING INTR AMPL: 10.4 mV
MDC IDC PG SERIAL: 595359
MDC IDC SET LEADCHNL RA PACING AMPLITUDE: 2.4 V
MDC IDC SET LEADCHNL RV SENSING SENSITIVITY: 2.5 mV

## 2014-05-19 NOTE — Progress Notes (Signed)
PPM check in clinic 

## 2014-05-31 ENCOUNTER — Encounter: Payer: Self-pay | Admitting: Internal Medicine

## 2014-06-23 ENCOUNTER — Ambulatory Visit: Payer: Self-pay | Admitting: Oncology

## 2014-06-23 LAB — MAGNESIUM: MAGNESIUM: 1.7 mg/dL — AB

## 2014-06-24 LAB — CANCER ANTIGEN 27.29: CA 27.29: 30.5 U/mL (ref 0.0–38.6)

## 2014-06-26 ENCOUNTER — Ambulatory Visit: Payer: Self-pay | Admitting: Oncology

## 2014-08-16 ENCOUNTER — Ambulatory Visit: Payer: Self-pay | Admitting: Family Medicine

## 2014-08-23 ENCOUNTER — Ambulatory Visit: Payer: Self-pay | Admitting: Oncology

## 2014-08-23 LAB — MAGNESIUM: MAGNESIUM: 1.3 mg/dL — AB

## 2014-08-26 ENCOUNTER — Ambulatory Visit: Payer: Self-pay | Admitting: Oncology

## 2014-09-20 ENCOUNTER — Ambulatory Visit (INDEPENDENT_AMBULATORY_CARE_PROVIDER_SITE_OTHER): Payer: BC Managed Care – PPO | Admitting: Internal Medicine

## 2014-09-20 ENCOUNTER — Encounter: Payer: Self-pay | Admitting: Internal Medicine

## 2014-09-20 VITALS — BP 130/84 | HR 86 | Ht 65.5 in | Wt 226.0 lb

## 2014-09-20 DIAGNOSIS — I440 Atrioventricular block, first degree: Secondary | ICD-10-CM

## 2014-09-20 LAB — MDC_IDC_ENUM_SESS_TYPE_INCLINIC
Brady Statistic RA Percent Paced: 0 %
Implantable Pulse Generator Serial Number: 595359
Lead Channel Impedance Value: 490 Ohm
Lead Channel Pacing Threshold Amplitude: 0.5 V
Lead Channel Pacing Threshold Amplitude: 0.8 V
Lead Channel Pacing Threshold Pulse Width: 0.4 ms
Lead Channel Sensing Intrinsic Amplitude: 9.8 mV
Lead Channel Setting Pacing Amplitude: 2.4 V
Lead Channel Setting Sensing Sensitivity: 2.5 mV
MDC IDC MSMT BATTERY REMAINING LONGEVITY: 60 mo
MDC IDC MSMT LEADCHNL RA PACING THRESHOLD PULSEWIDTH: 0.4 ms
MDC IDC MSMT LEADCHNL RA SENSING INTR AMPL: 1.7 mV
MDC IDC MSMT LEADCHNL RV IMPEDANCE VALUE: 460 Ohm
MDC IDC SET LEADCHNL RV PACING AMPLITUDE: 2.4 V
MDC IDC SET LEADCHNL RV PACING PULSEWIDTH: 0.4 ms
MDC IDC STAT BRADY RV PERCENT PACED: 98 %

## 2014-09-20 NOTE — Progress Notes (Signed)
Man    Electrophysiology Office Note   Date:  09/20/2014   ID:  Zarriah, Starkel Jun 19, 1951, MRN [id number]  PCP:  Golden Pop, MD  Cardiologist:  none Primary Electrophysiologist:    Virl Axe, MD    No chief complaint on file.    History of Present Illness: [name] is a 64 y.o. 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  She underwent generator replacement Dec 2011  Echo 06/2010 normal LV function  She is much better since pacemaker was implanted        Today, she denies symptoms of palpitations, chest pain, shortness of breath, orthopnea, PND, lower extremity edema, claudication, dizziness, presyncope, syncope, bleeding, or neurologic sequela. The patient is tolerating medications without difficulties and is otherwise without complaint today.    Past Medical History  Diagnosis Date  . Cardiomyopathy secondary   . AV block, 1st degree   . Pacemaker   . Breast CA     Chemo/XRT/lumpectomy  . 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 Sinus tachycardia   . Hypertension   . Hypersomnolence 09/21/2013  . Leg edema, left 09/21/2013   Past Surgical History  Procedure Laterality Date  . Cesarean section  1983  . Gallbladder surgery  1993  . Pacemaker insertion  05/13/2003    [employer] - DR  . Insert / replace / remove pacemaker  2011    revision @ [facility]  . Breast lumpectomy      left breast     Current Outpatient Prescriptions  Medication Sig Dispense Refill  . atenolol (TENORMIN) 50 MG tablet Take 50 mg by mouth daily.      . fluticasone (FLONASE) 50 MCG/ACT nasal spray Place 2 sprays into the nose daily.     . magnesium oxide (MAG-OX) 400 MG tablet Take 400 mg by mouth 2 (two) times daily.    . metFORMIN (GLUCOPHAGE) 500 MG tablet Take 500 mg by mouth daily with breakfast.    . naproxen sodium (ANAPROX) 220 MG tablet Take 220 mg by mouth  2 (two) times daily with a meal.    . NON FORMULARY chloratab as directed/ for sinus    . ranitidine (ZANTAC) 150 MG capsule Take 150 mg by mouth 2 (two) times daily.    . SUMAtriptan Succinate (IMITREX PO) Take by mouth as needed.      . [DISCONTINUED] AMILORIDE HCL PO Take by mouth as directed.       No current facility-administered medications for this visit.    Allergies:   Levaquin and Penicillins   Social History:  The patient  reports that she quit smoking about 33 years ago. She does not have any smokeless tobacco history on file. She reports that she does not drink alcohol or use illicit drugs.   Family History:  The patient's family history includes Stroke in her other.    ROS:  Please see the history of present illness and past medical history  Otherwise, review of systems  is negative.    PHYSICAL EXAM: VS:  BP 130/84 mmHg  Pulse 86  Ht 5' 5.5" (1.664 m)  Wt 226 lb (102.513 kg)  BMI 37.02 kg/m2 , BMI Body mass index is 37.02 kg/(m^2). GEN: Well nourished, well developed, in no acute distress HEENT: normal  Respiratory:normal work of breathing   Skin: warm and dry,  Neuro:  Strength  and sensation are intact Psych: euthymic mood, full affect  EKG:  EKG is ordered today. The ekg ordered today shows NSR 86 with P-synchronous/ AV  pacing   Device interrogation is reviewed today in detail.  See PaceArt for details.  Recent Labs: No results found for requested labs within last 365 days.    Lipid Panel  No results found for: CHOL, TRIG, HDL, CHOLHDL, VLDL, LDLCALC, LDLDIRECT   Wt Readings from Last 3 Encounters:  09/20/14 226 lb (102.513 kg)  09/21/13 237 lb (107.502 kg)  08/04/12 231 lb 4 oz (104.894 kg)      Other studies Reviewed: Additional studies/ records that were reviewed today include: none   Review of the above records today demonstrates: NA   ASSESSMENT AND PLAN: Dyspnea on Exertion/HFpEF  1 AV block  Pacemaker [place]  [employer]\  Hypersomnolence   Well compensated with normal device function   Current medicines are reviewed at length with the patient today.   The patient does not have concerns regarding her medicines.  The following changes were made today:  none  Labs/ tests ordered today include:    Orders Placed This Encounter  Procedures  . Implantable device check  . EKG 12-Lead     Disposition:   FU with device clinic 6 month(s)  Signed, Virl Axe, MD  09/20/2014 9:32 AM     [facility] [address] [place] [address] [phone] (office) [phone] (fax)

## 2014-09-20 NOTE — Patient Instructions (Signed)
Your physician wants you to follow-up in: 6 months with device clinic. You will receive a reminder letter in the mail two months in advance. If you don't receive a letter, please call our office to schedule the follow-up appointment.  Your physician wants you to follow-up in: 1 year with Dr. Caryl Comes. You will receive a reminder letter in the mail two months in advance. If you don't receive a letter, please call our office to schedule the follow-up appointment.

## 2014-09-29 ENCOUNTER — Encounter: Payer: Self-pay | Admitting: Internal Medicine

## 2014-10-24 ENCOUNTER — Ambulatory Visit: Payer: Self-pay | Admitting: Oncology

## 2014-10-25 ENCOUNTER — Ambulatory Visit
Admit: 2014-10-25 | Disposition: A | Discharge status: Home / Self Care | Payer: Self-pay | Attending: Oncology | Admitting: Oncology

## 2014-11-25 ENCOUNTER — Ambulatory Visit
Admit: 2014-11-25 | Disposition: A | Discharge status: Home / Self Care | Payer: Self-pay | Attending: Oncology | Admitting: Oncology

## 2014-12-22 LAB — MAGNESIUM: Magnesium: 1.6 mg/dL — ABNORMAL LOW

## 2015-02-23 ENCOUNTER — Other Ambulatory Visit: Payer: Self-pay

## 2015-02-23 ENCOUNTER — Ambulatory Visit: Payer: BC Managed Care – PPO

## 2015-02-23 ENCOUNTER — Other Ambulatory Visit: Payer: BC Managed Care – PPO

## 2015-02-23 DIAGNOSIS — C801 Malignant (primary) neoplasm, unspecified: Secondary | ICD-10-CM

## 2015-02-24 ENCOUNTER — Inpatient Hospital Stay: Payer: Medicare Other

## 2015-02-24 ENCOUNTER — Inpatient Hospital Stay: Payer: Medicare Other | Attending: Oncology

## 2015-02-24 DIAGNOSIS — C801 Malignant (primary) neoplasm, unspecified: Secondary | ICD-10-CM

## 2015-02-24 LAB — MAGNESIUM: Magnesium: 1.6 mg/dL — ABNORMAL LOW (ref 1.7–2.4)

## 2015-02-24 MED ORDER — SODIUM CHLORIDE 0.9 % IV SOLN: 2.0000 g | Freq: Once | INTRAVENOUS | Status: DC

## 2015-02-24 MED ORDER — HEPARIN SOD (PORK) LOCK FLUSH 100 UNIT/ML IV SOLN: 500.0000 [IU] | Freq: Once | INTRAVENOUS | Status: DC

## 2015-02-24 MED ORDER — SODIUM CHLORIDE 0.9 % IV SOLN
INTRAVENOUS | Status: DC
  Administered 2015-02-24: 14:00:00 via INTRAVENOUS
  Filled 2015-02-24: qty 1000

## 2015-02-24 MED ORDER — MAGNESIUM SULFATE 2 GM/50ML IV SOLN
2.0000 g | Freq: Once | INTRAVENOUS | Status: AC
  Administered 2015-02-24: 2 g via INTRAVENOUS
  Filled 2015-02-24: qty 50

## 2015-03-21 ENCOUNTER — Encounter: Payer: Self-pay | Admitting: *Deleted

## 2015-04-24 ENCOUNTER — Other Ambulatory Visit: Payer: Medicare Other

## 2015-04-24 ENCOUNTER — Ambulatory Visit: Payer: Self-pay

## 2015-04-25 ENCOUNTER — Other Ambulatory Visit: Payer: BC Managed Care – PPO

## 2015-04-25 ENCOUNTER — Ambulatory Visit: Payer: BC Managed Care – PPO

## 2015-05-05 ENCOUNTER — Other Ambulatory Visit: Payer: Self-pay | Admitting: Oncology

## 2015-05-05 DIAGNOSIS — C801 Malignant (primary) neoplasm, unspecified: Secondary | ICD-10-CM

## 2015-05-08 ENCOUNTER — Ambulatory Visit (INDEPENDENT_AMBULATORY_CARE_PROVIDER_SITE_OTHER): Payer: Medicare Other | Admitting: *Deleted

## 2015-05-08 ENCOUNTER — Inpatient Hospital Stay: Payer: Medicare Other

## 2015-05-08 ENCOUNTER — Inpatient Hospital Stay: Payer: Medicare Other | Attending: Oncology

## 2015-05-08 DIAGNOSIS — I440 Atrioventricular block, first degree: Secondary | ICD-10-CM

## 2015-05-08 DIAGNOSIS — Z950 Presence of cardiac pacemaker: Secondary | ICD-10-CM

## 2015-05-08 DIAGNOSIS — I471 Supraventricular tachycardia: Secondary | ICD-10-CM | POA: Diagnosis not present

## 2015-05-08 DIAGNOSIS — R000 Tachycardia, unspecified: Secondary | ICD-10-CM

## 2015-05-08 DIAGNOSIS — C801 Malignant (primary) neoplasm, unspecified: Secondary | ICD-10-CM

## 2015-05-08 LAB — MAGNESIUM: Magnesium: 1.6 mg/dL — ABNORMAL LOW (ref 1.7–2.4)

## 2015-05-08 LAB — CUP PACEART INCLINIC DEVICE CHECK
Brady Statistic RV Percent Paced: 98 %
Date Time Interrogation Session: 20160912040000
Lead Channel Impedance Value: 440 Ohm
Lead Channel Impedance Value: 480 Ohm
Lead Channel Pacing Threshold Pulse Width: 0.4 ms
Lead Channel Pacing Threshold Pulse Width: 0.4 ms
Lead Channel Sensing Intrinsic Amplitude: 10.9 mV
Lead Channel Setting Pacing Amplitude: 2 V
MDC IDC MSMT LEADCHNL RA PACING THRESHOLD AMPLITUDE: 0.6 V
MDC IDC MSMT LEADCHNL RA SENSING INTR AMPL: 1.8 mV
MDC IDC MSMT LEADCHNL RV PACING THRESHOLD AMPLITUDE: 0.9 V
MDC IDC SET LEADCHNL RV PACING AMPLITUDE: 2.4 V
MDC IDC SET LEADCHNL RV PACING PULSEWIDTH: 0.4 ms
MDC IDC SET LEADCHNL RV SENSING SENSITIVITY: 2.5 mV
MDC IDC STAT BRADY RA PERCENT PACED: 0 %
Pulse Gen Serial Number: 595359

## 2015-05-08 MED ORDER — MAGNESIUM SULFATE 2 GM/50ML IV SOLN
2.0000 g | Freq: Once | INTRAVENOUS | Status: AC
  Administered 2015-05-08: 2 g via INTRAVENOUS
  Filled 2015-05-08: qty 50

## 2015-05-08 MED ORDER — SODIUM CHLORIDE 0.9 % IV SOLN: 2.0000 g | Freq: Once | INTRAVENOUS | Status: DC

## 2015-05-08 MED ORDER — SODIUM CHLORIDE 0.9 % IV SOLN
INTRAVENOUS | Status: DC
  Administered 2015-05-08: 11:00:00 via INTRAVENOUS
  Filled 2015-05-08: qty 1000

## 2015-05-08 NOTE — Progress Notes (Signed)
Pacemaker check in clinic. Normal device function. Thresholds, sensing, impedances consistent with previous measurements. Device programmed to maximize longevity. 8 ATRs---longest 8sec. No high ventricular rates noted. Device programmed at appropriate safety margins. Histogram distribution appropriate for patient activity level. Device now programmed to optimize intrinsic conduction---changed paced AV from 150 to 151ms. Turned on AV search hysteresis to 32 interval search cycle with 40% AV increase. Walk tested pt after changes, pt asymptomatic. Estimated longevity 5.26yrs. ROV w/ SK/B in 60mo.

## 2015-05-16 ENCOUNTER — Encounter: Payer: Self-pay | Admitting: Family Medicine

## 2015-06-06 ENCOUNTER — Encounter: Payer: Self-pay | Admitting: Internal Medicine

## 2015-06-16 ENCOUNTER — Other Ambulatory Visit: Payer: Self-pay | Admitting: Oncology

## 2015-06-16 DIAGNOSIS — C801 Malignant (primary) neoplasm, unspecified: Secondary | ICD-10-CM

## 2015-06-19 ENCOUNTER — Ambulatory Visit (INDEPENDENT_AMBULATORY_CARE_PROVIDER_SITE_OTHER): Payer: Medicare Other | Admitting: Family Medicine

## 2015-06-19 ENCOUNTER — Inpatient Hospital Stay: Payer: Medicare Other

## 2015-06-19 ENCOUNTER — Encounter: Payer: Self-pay | Admitting: Family Medicine

## 2015-06-19 ENCOUNTER — Inpatient Hospital Stay: Payer: Medicare Other | Attending: Oncology | Admitting: Oncology

## 2015-06-19 VITALS — BP 116/77 | HR 77 | Temp 97.8°F | Ht 64.5 in | Wt 216.0 lb

## 2015-06-19 DIAGNOSIS — E1159 Type 2 diabetes mellitus with other circulatory complications: Secondary | ICD-10-CM | POA: Insufficient documentation

## 2015-06-19 DIAGNOSIS — G4710 Hypersomnia, unspecified: Secondary | ICD-10-CM | POA: Insufficient documentation

## 2015-06-19 DIAGNOSIS — Z853 Personal history of malignant neoplasm of breast: Secondary | ICD-10-CM | POA: Insufficient documentation

## 2015-06-19 DIAGNOSIS — G629 Polyneuropathy, unspecified: Secondary | ICD-10-CM | POA: Diagnosis not present

## 2015-06-19 DIAGNOSIS — E119 Type 2 diabetes mellitus without complications: Secondary | ICD-10-CM

## 2015-06-19 DIAGNOSIS — I429 Cardiomyopathy, unspecified: Secondary | ICD-10-CM | POA: Diagnosis not present

## 2015-06-19 DIAGNOSIS — Z87891 Personal history of nicotine dependence: Secondary | ICD-10-CM | POA: Insufficient documentation

## 2015-06-19 DIAGNOSIS — R000 Tachycardia, unspecified: Secondary | ICD-10-CM | POA: Diagnosis not present

## 2015-06-19 DIAGNOSIS — C801 Malignant (primary) neoplasm, unspecified: Secondary | ICD-10-CM

## 2015-06-19 DIAGNOSIS — M5432 Sciatica, left side: Secondary | ICD-10-CM

## 2015-06-19 DIAGNOSIS — Z124 Encounter for screening for malignant neoplasm of cervix: Secondary | ICD-10-CM | POA: Diagnosis not present

## 2015-06-19 DIAGNOSIS — Z0000 Encounter for general adult medical examination without abnormal findings: Secondary | ICD-10-CM | POA: Diagnosis not present

## 2015-06-19 DIAGNOSIS — R252 Cramp and spasm: Secondary | ICD-10-CM | POA: Diagnosis not present

## 2015-06-19 DIAGNOSIS — Z79899 Other long term (current) drug therapy: Secondary | ICD-10-CM | POA: Insufficient documentation

## 2015-06-19 DIAGNOSIS — Z23 Encounter for immunization: Secondary | ICD-10-CM

## 2015-06-19 DIAGNOSIS — I10 Essential (primary) hypertension: Secondary | ICD-10-CM

## 2015-06-19 DIAGNOSIS — Z809 Family history of malignant neoplasm, unspecified: Secondary | ICD-10-CM | POA: Diagnosis not present

## 2015-06-19 DIAGNOSIS — Z7984 Long term (current) use of oral hypoglycemic drugs: Secondary | ICD-10-CM | POA: Insufficient documentation

## 2015-06-19 DIAGNOSIS — R290 Tetany: Secondary | ICD-10-CM | POA: Diagnosis not present

## 2015-06-19 DIAGNOSIS — Z1383 Encounter for screening for respiratory disorder NEC: Secondary | ICD-10-CM

## 2015-06-19 DIAGNOSIS — R600 Localized edema: Secondary | ICD-10-CM | POA: Insufficient documentation

## 2015-06-19 DIAGNOSIS — E1169 Type 2 diabetes mellitus with other specified complication: Secondary | ICD-10-CM | POA: Insufficient documentation

## 2015-06-19 DIAGNOSIS — Z1389 Encounter for screening for other disorder: Secondary | ICD-10-CM

## 2015-06-19 DIAGNOSIS — M5430 Sciatica, unspecified side: Secondary | ICD-10-CM | POA: Insufficient documentation

## 2015-06-19 DIAGNOSIS — Z136 Encounter for screening for cardiovascular disorders: Secondary | ICD-10-CM

## 2015-06-19 HISTORY — DX: Type 2 diabetes mellitus without complications: E11.9

## 2015-06-19 LAB — MAGNESIUM: Magnesium: 1.4 mg/dL — ABNORMAL LOW (ref 1.7–2.4)

## 2015-06-19 LAB — URINALYSIS, ROUTINE W REFLEX MICROSCOPIC
BILIRUBIN UA: NEGATIVE
Glucose, UA: NEGATIVE
KETONES UA: NEGATIVE
LEUKOCYTES UA: NEGATIVE
Nitrite, UA: NEGATIVE
PH UA: 5.5 (ref 5.0–7.5)
PROTEIN UA: NEGATIVE
SPEC GRAV UA: 1.025 (ref 1.005–1.030)
Urobilinogen, Ur: 1 mg/dL (ref 0.2–1.0)

## 2015-06-19 LAB — MICROSCOPIC EXAMINATION
RBC, UA: NONE SEEN /hpf (ref 0–?)
WBC UA: NONE SEEN /HPF (ref 0–?)

## 2015-06-19 LAB — BAYER DCA HB A1C WAIVED: HB A1C: 6 % (ref <7.0)

## 2015-06-19 MED ORDER — METFORMIN HCL 500 MG PO TABS: 500.0000 mg | ORAL_TABLET | Freq: Every day | ORAL | Status: DC

## 2015-06-19 MED ORDER — FLUTICASONE PROPIONATE 50 MCG/ACT NA SUSP: 2.0000 | Freq: Every day | NASAL | Status: DC

## 2015-06-19 MED ORDER — MAGNESIUM SULFATE 4 GM/100ML IV SOLN: 4.0000 g | Freq: Once | INTRAVENOUS | Status: DC

## 2015-06-19 MED ORDER — SODIUM CHLORIDE 0.9 % IV SOLN
4.0000 g | Freq: Once | INTRAVENOUS | Status: DC
  Filled 2015-06-19: qty 8

## 2015-06-19 MED ORDER — ATENOLOL 50 MG PO TABS: 50.0000 mg | ORAL_TABLET | Freq: Every day | ORAL | Status: DC

## 2015-06-19 MED ORDER — MELOXICAM 15 MG PO TABS: 15.0000 mg | ORAL_TABLET | Freq: Every day | ORAL | Status: DC

## 2015-06-19 MED ORDER — AMILORIDE HCL 5 MG PO TABS: 5.0000 mg | ORAL_TABLET | Freq: Every day | ORAL | Status: DC

## 2015-06-19 NOTE — Assessment & Plan Note (Signed)
The current medical regimen is effective;  continue present plan and medications.  

## 2015-06-19 NOTE — Assessment & Plan Note (Signed)
Discuss sciatica extension exercises if not better will reevaluate with Dr. Wynetta Emery

## 2015-06-19 NOTE — Progress Notes (Addendum)
BP 116/77 mmHg  Pulse 77  Temp(Src) 97.8 F (36.6 C)  Ht 5' 4.5" (1.638 m)  Wt 216 lb (97.977 kg)  BMI 36.52 kg/m2  SpO2 96%   Subjective:    Patient ID: [name], female    DOB: 1951-08-08, 64 y.o.   MRN: [id number]  HPI: [name] is a 64 y.o. female  Chief Complaint  Patient presents with  . Annual Exam  Welcome to Medicare annual wellness visit Metrix met  Patient also with left sciatica symptoms radiating down into left posterior leg ongoing for several months to known specific trauma irritation limits her sleep at night just because it hurts. Heat walking around helps not lying in bed helps and applies to her skin which helps some. Takes Aleve every day which she takes anyway for arthritis complaints. Blood pressure no complaints doing well with medications No low blood sugar spells no issues with diabetes seems to be doing well. Allergies and headaches okay.   Relevant past medical, surgical, family and social history reviewed and updated as indicated. Interim medical history since our last visit reviewed. Allergies and medications reviewed and updated.  Review of Systems  Constitutional: Negative.   HENT: Negative.   Eyes: Negative.   Respiratory: Negative.   Cardiovascular: Negative.   Gastrointestinal: Negative.   Endocrine: Negative.   Genitourinary: Negative.   Musculoskeletal: Negative.   Skin: Negative.   Allergic/Immunologic: Negative.   Neurological: Negative.   Hematological: Negative.   Psychiatric/Behavioral: Negative.     Per HPI unless specifically indicated above     Objective:    BP 116/77 mmHg  Pulse 77  Temp(Src) 97.8 F (36.6 C)  Ht 5' 4.5" (1.638 m)  Wt 216 lb (97.977 kg)  BMI 36.52 kg/m2  SpO2 96%  Wt Readings from Last 3 Encounters:  06/19/15 216 lb (97.977 kg)  09/20/14 226 lb (102.513 kg)  09/21/13 237 lb (107.502 kg)    Physical Exam  Constitutional: She is oriented to person, place, and time. She appears  well-developed and well-nourished.  HENT:  Head: Normocephalic and atraumatic.  Right Ear: External ear normal.  Left Ear: External ear normal.  Nose: Nose normal.  Mouth/Throat: Oropharynx is clear and moist.  Eyes: Conjunctivae and EOM are normal. Pupils are equal, round, and reactive to light.  Neck: Normal range of motion. Neck supple. Carotid bruit is not present.  Cardiovascular: Normal rate, regular rhythm and normal heart sounds.   No murmur heard. Pulmonary/Chest: Effort normal and breath sounds normal. She exhibits no mass.  Breast exam will be done with oncology later today  Abdominal: Soft. Bowel sounds are normal. There is no hepatosplenomegaly.  Genitourinary: Vagina normal and uterus normal. No vaginal discharge [name].  Musculoskeletal: Normal range of motion.  Neurological: She is alert and oriented to person, place, and time.  Skin: No rash noted.  Psychiatric: She has a normal mood and affect. Her behavior is normal. Judgment and thought content normal.   welcome to Medicare EKG sensing and pacing  Results for orders placed or performed in visit on 05/08/15  Implantable device check  Result Value Ref Range   Date Time Interrogation Session [id number]    Pulse Generator Manufacturer BOST    Pulse Gen Model S404 ALTRUA 40    Pulse Gen Serial Number [id number]    Implantable Pulse Generator Type Implantable Pulse Generator    Implantable Pulse Generator Implant Date [id number]    Lead Channel Setting Sensing Sensitivity 2.5 mV  Lead Channel Setting Sensing Adaptation Mode Fixed Pacing    Lead Channel Setting Pacing Amplitude 2.0 V   Lead Channel Setting Pacing Pulse Width 0.40 ms   Lead Channel Setting Pacing Amplitude 2.4 V   Lead Channel Impedance Value 480 ohm   Lead Channel Sensing Intrinsic Amplitude 1.8 mV   Lead Channel Pacing Threshold Amplitude 0.6 V   Lead Channel Pacing Threshold Pulse Width 0.40 ms   Lead Channel Impedance Value 440 ohm    Lead Channel Sensing Intrinsic Amplitude 10.9 mV   Lead Channel Pacing Threshold Amplitude 0.9 V   Lead Channel Pacing Threshold Pulse Width 0.4 ms   Battery Status BOS    Brady Statistic RA Percent Paced 0 %   Brady Statistic RV Percent Paced 98 %   Eval Rhythm SR_0        Assessment & Plan:   Problem List Items Addressed This Visit      Cardiovascular and Mediastinum   Essential hypertension    The current medical regimen is effective;  continue present plan and medications.       Relevant Medications   atenolol (TENORMIN) 50 MG tablet   aMILoride (MIDAMOR) 5 MG tablet   Other Relevant Orders   Comprehensive metabolic panel   Lipid panel   CBC with Differential/Platelet   Urinalysis, Routine w reflex microscopic (not at [facility])   TSH     Endocrine   Type 2 diabetes mellitus ([place])    The current medical regimen is effective;  continue present plan and medications.       Relevant Medications   metFORMIN (GLUCOPHAGE) 500 MG tablet   Other Relevant Orders   Bayer DCA Hb A1c Waived   Comprehensive metabolic panel   Lipid panel   CBC with Differential/Platelet   Urinalysis, Routine w reflex microscopic (not at [facility])   TSH     Nervous and Auditory   Sciatica    Discuss sciatica extension exercises if not better will reevaluate with Dr. Wynetta Emery      Relevant Medications   meloxicam (MOBIC) 15 MG tablet   Other Relevant Orders   TSH    Other Visit Diagnoses    Immunization due    -  Primary    Relevant Orders    Flu Vaccine QUAD 36+ mos PF IM (Fluarix & Fluzone Quad PF) (Completed)    Screening for cardiovascular, respiratory, and genitourinary diseases        Relevant Orders    EKG 12-Lead (Completed)    PE (physical exam), annual            Follow up plan: Return in about 3 months (around 09/19/2015), or Sooner if back not doing well for appointment with Dr. Wynetta Emery, for a1c.

## 2015-06-19 NOTE — Addendum Note (Signed)
Addended byGolden Pop on: 06/19/2015 11:37 AM   Modules accepted: Miquel Dunn

## 2015-06-19 NOTE — Addendum Note (Signed)
Addended by: Rowe Clack H on: 06/19/2015 11:18 AM   Modules accepted: Orders, SmartSet

## 2015-06-20 ENCOUNTER — Encounter: Payer: Self-pay | Admitting: Family Medicine

## 2015-06-20 LAB — COMPREHENSIVE METABOLIC PANEL
A/G RATIO: 2.1 (ref 1.1–2.5)
ALK PHOS: 85 IU/L (ref 39–117)
ALT: 19 IU/L (ref 0–32)
AST: 19 IU/L (ref 0–40)
Albumin: 4.4 g/dL (ref 3.6–4.8)
BILIRUBIN TOTAL: 0.5 mg/dL (ref 0.0–1.2)
BUN/Creatinine Ratio: 21 (ref 11–26)
BUN: 16 mg/dL (ref 8–27)
CHLORIDE: 103 mmol/L (ref 97–106)
CO2: 23 mmol/L (ref 18–29)
Calcium: 9.7 mg/dL (ref 8.7–10.3)
Creatinine, Ser: 0.77 mg/dL (ref 0.57–1.00)
GFR calc non Af Amer: 81 mL/min/{1.73_m2} (ref >59)
GFR, EST AFRICAN AMERICAN: 94 mL/min/{1.73_m2} (ref >59)
Globulin, Total: 2.1 g/dL (ref 1.5–4.5)
Glucose: 106 mg/dL — ABNORMAL HIGH (ref 65–99)
POTASSIUM: 4.6 mmol/L (ref 3.5–5.2)
Sodium: 144 mmol/L (ref 136–144)
TOTAL PROTEIN: 6.5 g/dL (ref 6.0–8.5)

## 2015-06-20 LAB — CBC WITH DIFFERENTIAL/PLATELET
BASOS ABS: 0 10*3/uL (ref 0.0–0.2)
BASOS: 0 %
EOS (ABSOLUTE): 0.1 10*3/uL (ref 0.0–0.4)
EOS: 2 %
HEMATOCRIT: 40.8 % (ref 34.0–46.6)
HEMOGLOBIN: 13.9 g/dL (ref 11.1–15.9)
Immature Grans (Abs): 0 10*3/uL (ref 0.0–0.1)
Immature Granulocytes: 0 %
LYMPHS ABS: 1.4 10*3/uL (ref 0.7–3.1)
Lymphs: 31 %
MCH: 30.2 pg (ref 26.6–33.0)
MCHC: 34.1 g/dL (ref 31.5–35.7)
MCV: 89 fL (ref 79–97)
MONOCYTES: 8 %
Monocytes Absolute: 0.4 10*3/uL (ref 0.1–0.9)
NEUTROS ABS: 2.6 10*3/uL (ref 1.4–7.0)
Neutrophils: 59 %
Platelets: 210 10*3/uL (ref 150–379)
RBC: 4.6 x10E6/uL (ref 3.77–5.28)
RDW: 13.7 % (ref 12.3–15.4)
WBC: 4.5 10*3/uL (ref 3.4–10.8)

## 2015-06-20 LAB — LIPID PANEL
CHOLESTEROL TOTAL: 214 mg/dL — AB (ref 100–199)
Chol/HDL Ratio: 5.2 ratio units — ABNORMAL HIGH (ref 0.0–4.4)
HDL: 41 mg/dL (ref >39)
LDL Calculated: 143 mg/dL — ABNORMAL HIGH (ref 0–99)
Triglycerides: 149 mg/dL (ref 0–149)
VLDL Cholesterol Cal: 30 mg/dL (ref 5–40)

## 2015-06-20 LAB — CANCER ANTIGEN 27.29: CA 27.29: 32 U/mL (ref 0.0–38.6)

## 2015-06-20 LAB — TSH: TSH: 2.13 u[IU]/mL (ref 0.450–4.500)

## 2015-06-21 ENCOUNTER — Other Ambulatory Visit: Payer: BC Managed Care – PPO

## 2015-06-21 ENCOUNTER — Ambulatory Visit: Payer: BC Managed Care – PPO

## 2015-06-21 ENCOUNTER — Ambulatory Visit: Payer: BC Managed Care – PPO | Admitting: Oncology

## 2015-06-23 ENCOUNTER — Inpatient Hospital Stay: Payer: Medicare Other

## 2015-06-23 DIAGNOSIS — Z853 Personal history of malignant neoplasm of breast: Secondary | ICD-10-CM | POA: Diagnosis not present

## 2015-06-23 LAB — IGP, APTIMA HPV, RFX 16/18,45: PAP SMEAR COMMENT: 0

## 2015-06-23 MED ORDER — MAGNESIUM SULFATE 2 GM/50ML IV SOLN
2.0000 g | Freq: Once | INTRAVENOUS | Status: AC
  Administered 2015-06-23: 2 g via INTRAVENOUS
  Filled 2015-06-23: qty 50

## 2015-06-23 MED ORDER — SODIUM CHLORIDE 0.9 % IV SOLN
INTRAVENOUS | Status: DC
  Administered 2015-06-23: 14:00:00 via INTRAVENOUS
  Filled 2015-06-23: qty 1000

## 2015-06-26 NOTE — Progress Notes (Signed)
[place]  [phone]) [phone] [phone]) [phone]  ID: [name] OB: May 10, 1951  MR#: [id number]  JJH#:[id number]  Patient Care Team: Guadalupe Maple, MD as PCP - General (Unknown Physician Specialty) Lloyd Huger, MD as Consulting Physician (Oncology) Dagoberto Ligas, MD as Referring Physician (Internal Medicine) Deboraha Sprang, MD as Consulting Physician (Cardiology)  CHIEF COMPLAINT:  Chief Complaint  Patient presents with  . Breast Cancer    INTERVAL HISTORY: Patient returns to clinic today for laboratory work and routine evaluation. Currently she continues to have occasional cramping and a mild peripheral neuropathy, but otherwise feels well. She had no recent fevers or illnesses.  She denies any chest pain, shortness of breath, or hemoptysis. She has no peripheral edema.  She denies any nausea, vomiting, or constipation. She denies any weakness and fatigue. She has a good appetite and has maintained her weight.  She offers no further specific complaints today.  REVIEW OF SYSTEMS:   Review of Systems  Constitutional: Negative.   Cardiovascular: Negative.   Gastrointestinal: Negative.   Musculoskeletal:       Occasional muscle cramping  Neurological: Positive for sensory change.    As per HPI. Otherwise, a complete review of systems is negatve.  PAST MEDICAL HISTORY: Past Medical History  Diagnosis Date  . Cardiomyopathy secondary   . AV block, 1st degree   . Pacemaker   . Breast CA ([place])     Chemo/XRT/lumpectomy  . 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 Sinus tachycardia ([place])   . Hypertension   . Hypersomnolence 09/21/2013  . Leg edema, left 09/21/2013    PAST SURGICAL HISTORY: Past Surgical History  Procedure Laterality Date  . Cesarean section  1983  . Gallbladder surgery  1993  . Pacemaker insertion  05/13/2003    [employer] - DR  . Insert / replace / remove pacemaker  2011    revision @ [facility]  .  Breast lumpectomy      left breast    FAMILY HISTORY Family History  Problem Relation Age of Onset  . Stroke Other     Family hx of CVA or stroke  . Diabetes Mother   . Heart disease Mother   . Thyroid disease Mother   . Hypertension Father   . Cancer Father   . Alcohol abuse Father   . Diabetes Father   . Diabetes Brother   . Diabetes Daughter   . Diabetes Paternal Uncle   . Diabetes Paternal Grandmother        ADVANCED DIRECTIVES:    HEALTH MAINTENANCE: Social History  Substance Use Topics  . Smoking status: Former Smoker    Quit date: 08/26/1981  . Smokeless tobacco: Never Used  . Alcohol Use: Yes     Comment: rare     Colonoscopy:  PAP:  Bone density:  Lipid panel:  Allergies  Allergen Reactions  . Penicillins Swelling    Causes organs to swell  . Levaquin [Levofloxacin] Swelling    Leg swelling and muscle weakness    Current Outpatient Prescriptions  Medication Sig Dispense Refill  . aMILoride (MIDAMOR) 5 MG tablet Take 1 tablet (5 mg total) by mouth daily. 30 tablet 7  . atenolol (TENORMIN) 50 MG tablet Take 1 tablet (50 mg total) by mouth daily. 30 tablet 12  . cetirizine (ZYRTEC) 5 MG tablet Take 5 mg by mouth as needed for allergies. Seasonal allergies    . fluticasone (FLONASE) 50 MCG/ACT nasal  spray Place 2 sprays into both nostrils daily. 16 g 12  . magnesium oxide (MAG-OX) 400 MG tablet Take 400 mg by mouth 2 (two) times daily.    . meloxicam (MOBIC) 15 MG tablet Take 1 tablet (15 mg total) by mouth daily. 30 tablet 3  . metFORMIN (GLUCOPHAGE) 500 MG tablet Take 1 tablet (500 mg total) by mouth at bedtime. 30 tablet 12  . naproxen sodium (ANAPROX) 220 MG tablet Take 220 mg by mouth 2 (two) times daily with a meal.    . NON FORMULARY chloratab as directed/ for sinus    . ranitidine (ZANTAC) 150 MG capsule Take 150 mg by mouth 2 (two) times daily.    . SUMAtriptan Succinate (IMITREX PO) Take by mouth as needed.       Current  Facility-Administered Medications  Medication Dose Route Frequency Provider Last Rate Last Dose  . magnesium sulfate IVPB 4 g 100 mL  4 g Intravenous Once Lloyd Huger, MD        OBJECTIVE: Filed Vitals:   06/19/15 1355  BP: 135/78  Pulse: 96  Temp: 96.7 F (35.9 C)  Resp: 18     Body mass index is 36.94 kg/(m^2).    ECOG FS:0 - Asymptomatic  General: Well-developed, well-nourished, no acute distress. Eyes: Pink conjunctiva, anicteric sclera. Breasts: Bilateral breast and axilla without lumps or masses. Lungs: Clear to auscultation bilaterally. Heart: Regular rate and rhythm. No rubs, murmurs, or gallops. Abdomen: Soft, nontender, nondistended. No organomegaly noted, normoactive bowel sounds. Musculoskeletal: No edema, cyanosis, or clubbing. Neuro: Alert, answering all questions appropriately. Cranial nerves grossly intact. Skin: No rashes or petechiae noted. Psych: Normal affect.   LAB RESULTS:  Lab Results  Component Value Date   NA 144 06/19/2015   K 4.6 06/19/2015   CL 103 06/19/2015   CO2 23 06/19/2015   GLUCOSE 106* 06/19/2015   BUN 16 06/19/2015   CREATININE 0.77 06/19/2015   CALCIUM 9.7 06/19/2015   PROT 6.5 06/19/2015   ALBUMIN 4.4 06/19/2015   AST 19 06/19/2015   ALT 19 06/19/2015   ALKPHOS 85 06/19/2015   BILITOT 0.5 06/19/2015   GFRNONAA 81 06/19/2015   GFRAA 94 06/19/2015    Lab Results  Component Value Date   WBC 4.5 06/19/2015   NEUTROABS 2.6 06/19/2015   HGB 13.8 05/24/2013   HCT 40.8 06/19/2015   MCV 88 05/24/2013   PLT 189 05/24/2013     STUDIES: No results [name].  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  PLAN:    1. Breast cancer:  No evidence of disease.  Patient completed Herceptin on Jan 02, 2009.  Patient's most recent mammogram on November 21, 2014 was reported as BI-RADS 2.  Repeat in March of 2017.  She does not require an aromatase inhibitor given ER/PR status of her  tumor.  Return to clinic in one year for repeat laboratory work and further evaluation.     2.  Hypomagnesemia/cramping: Patient's magnesium is 1.4. She will return to clinic Friday for 4 g IV magnesium.  Continue amiloride 5 mg daily and 2 tabs Slow-Mag twice a day.  Return to clinic every 8 weeks for laboratory work and consideration of IV magnesium infusion.  Patient will receive magnesium infusion if her levels are <1.6 or if she is symptomatic.  Patient expressed understanding and was in agreement with this plan. She also understands that She can call clinic at any time with any questions, concerns,  or complaints.    Lloyd Huger, MD   06/26/2015 9:03 AM

## 2015-07-28 ENCOUNTER — Telehealth: Payer: Self-pay | Admitting: Family Medicine

## 2015-07-28 NOTE — Telephone Encounter (Signed)
Notified patient that antibiotics would not be called in by the providers that are here. She wanted to know if a strong cough syrup could be called in, I offered to ask a provider then she stated never mind.

## 2015-07-28 NOTE — Telephone Encounter (Signed)
Pt would like to have something called in to [facility] for a cold/cough/sore throat she's had for about a week.

## 2015-08-14 ENCOUNTER — Ambulatory Visit: Payer: Medicare Other

## 2015-08-14 ENCOUNTER — Other Ambulatory Visit: Payer: Medicare Other

## 2015-09-04 ENCOUNTER — Inpatient Hospital Stay: Payer: Medicare Other

## 2015-09-22 ENCOUNTER — Encounter: Payer: Self-pay | Admitting: Internal Medicine

## 2015-10-02 ENCOUNTER — Encounter: Payer: Self-pay | Admitting: Family Medicine

## 2015-10-02 ENCOUNTER — Ambulatory Visit (INDEPENDENT_AMBULATORY_CARE_PROVIDER_SITE_OTHER): Payer: Medicare Other | Admitting: Family Medicine

## 2015-10-02 VITALS — BP 110/71 | HR 71 | Temp 97.3°F | Ht 64.2 in | Wt 215.0 lb

## 2015-10-02 DIAGNOSIS — Z950 Presence of cardiac pacemaker: Secondary | ICD-10-CM | POA: Diagnosis not present

## 2015-10-02 DIAGNOSIS — C50912 Malignant neoplasm of unspecified site of left female breast: Secondary | ICD-10-CM | POA: Insufficient documentation

## 2015-10-02 DIAGNOSIS — Z853 Personal history of malignant neoplasm of breast: Secondary | ICD-10-CM | POA: Insufficient documentation

## 2015-10-02 DIAGNOSIS — E119 Type 2 diabetes mellitus without complications: Secondary | ICD-10-CM

## 2015-10-02 DIAGNOSIS — I10 Essential (primary) hypertension: Secondary | ICD-10-CM | POA: Diagnosis not present

## 2015-10-02 DIAGNOSIS — C50112 Malignant neoplasm of central portion of left female breast: Secondary | ICD-10-CM | POA: Diagnosis not present

## 2015-10-02 LAB — MICROALBUMIN, URINE WAIVED
Creatinine, Urine Waived: 200 mg/dL (ref 10–300)
Microalb, Ur Waived: 10 mg/L (ref 0–19)
Microalb/Creat Ratio: <30 mg/g (ref <30)

## 2015-10-02 LAB — BAYER DCA HB A1C WAIVED: HB A1C (BAYER DCA - WAIVED): 5.5 % (ref <7.0)

## 2015-10-02 NOTE — Progress Notes (Signed)
BP 110/71 mmHg  Pulse 71  Temp(Src) 97.3 F (36.3 C)  Ht 5' 4.2" (1.631 m)  Wt 215 lb (97.523 kg)  BMI 36.66 kg/m2  SpO2 99%   Subjective:    Patient ID: [name], female    DOB: [birth year], 65 y.o.   MRN: [id number]  HPI: [name] is a 65 y.o. female  Chief Complaint  Patient presents with  . Diabetes   good control taking metformin no low blood sugar spells A1c indicating doing excellent Getting good reports from [facility] on breast cancer has mammogram and further follow-up scheduled next month Pacemaker doing well has follow-up this spring for pacemaker check Blood pressure doing well no complaints from medications  Relevant past medical, surgical, family and social history reviewed and updated as indicated. Interim medical history since our last visit reviewed. Allergies and medications reviewed and updated.  Review of Systems  Constitutional: Negative.   HENT: Negative.   Eyes: Negative.   Respiratory: Negative.   Cardiovascular: Negative.   Gastrointestinal: Negative.   Endocrine: Negative.   Genitourinary: Negative.   Musculoskeletal: Negative.        Bursitis with occasional flare especially in left shoulder but otherwise doing okay.  Skin: Negative.   Allergic/Immunologic: Negative.   Neurological: Negative.   Hematological: Negative.   Psychiatric/Behavioral: Negative.     Per HPI unless specifically indicated above     Objective:    BP 110/71 mmHg  Pulse 71  Temp(Src) 97.3 F (36.3 C)  Ht 5' 4.2" (1.631 m)  Wt 215 lb (97.523 kg)  BMI 36.66 kg/m2  SpO2 99%  Wt Readings from Last 3 Encounters:  10/02/15 215 lb (97.523 kg)  06/19/15 218 lb 7.6 oz (99.1 kg)  06/19/15 216 lb (97.977 kg)    Physical Exam  Constitutional: She is oriented to person, place, and time. She appears well-developed and well-nourished.  HENT:  Head: Normocephalic and atraumatic.  Right Ear: External ear normal.  Left Ear: External ear normal.  Nose: Nose  normal.  Mouth/Throat: Oropharynx is clear and moist.  Eyes: Conjunctivae and EOM are normal. Pupils are equal, round, and reactive to light.  Neck: Normal range of motion. Neck supple. Carotid bruit is not present.  Cardiovascular: Normal rate, regular rhythm and normal heart sounds.   No murmur heard. Pulmonary/Chest: Effort normal and breath sounds normal. She exhibits no mass. Right breast exhibits no mass, no skin change and no tenderness. Left breast exhibits no mass, no skin change and no tenderness. Breasts are symmetrical.  Abdominal: Soft. Bowel sounds are normal. There is no hepatosplenomegaly.  Musculoskeletal: Normal range of motion.  Neurological: She is alert and oriented to person, place, and time.  Skin: No rash noted.  Psychiatric: She has a normal mood and affect. Her behavior is normal. Judgment and thought content normal.    Results for orders placed or performed in visit on 06/19/15  Cancer antigen 27.29  Result Value Ref Range   CA 27.29 32.0 0.0 - 38.6 U/mL  Magnesium  Result Value Ref Range   Magnesium 1.4 (L) 1.7 - 2.4 mg/dL      Assessment & Plan:   Problem List Items Addressed This Visit      Cardiovascular and Mediastinum   Essential hypertension    The current medical regimen is effective;  continue present plan and medications.         Endocrine   Type 2 diabetes mellitus ([place])    The current medical  regimen is effective;  continue present plan and medications.         Other   Pacemaker   Magnesium deficiency syndrome   Breast CA ([place])    Other Visit Diagnoses    Diabetes mellitus without complication ([place])    -  Primary    Relevant Orders    Bayer DCA Hb A1c Waived    Microalbumin, Urine Waived        Follow up plan: Return in about 6 months (around 03/31/2016) for a1c and med check.

## 2015-10-02 NOTE — Assessment & Plan Note (Signed)
The current medical regimen is effective;  continue present plan and medications.  

## 2015-10-09 ENCOUNTER — Other Ambulatory Visit: Payer: Self-pay | Admitting: *Deleted

## 2015-10-09 ENCOUNTER — Inpatient Hospital Stay: Payer: Medicare Other

## 2015-10-09 ENCOUNTER — Inpatient Hospital Stay: Payer: Medicare Other | Attending: Oncology

## 2015-10-09 ENCOUNTER — Other Ambulatory Visit: Payer: Self-pay | Admitting: Oncology

## 2015-10-09 DIAGNOSIS — C50112 Malignant neoplasm of central portion of left female breast: Secondary | ICD-10-CM

## 2015-10-09 LAB — MAGNESIUM: MAGNESIUM: 1.5 mg/dL — AB (ref 1.7–2.4)

## 2015-10-09 MED ORDER — MAGNESIUM SULFATE 4 GM/100ML IV SOLN: 4.0000 g | Freq: Once | INTRAVENOUS | Status: DC

## 2015-10-09 MED ORDER — SODIUM CHLORIDE 0.9 % IV SOLN: 4.0000 g | Freq: Once | INTRAVENOUS | Status: DC

## 2015-10-09 MED ORDER — MAGNESIUM SULFATE 4 GM/100ML IV SOLN
4.0000 g | Freq: Once | INTRAVENOUS | Status: AC
  Administered 2015-10-09: 4 g via INTRAVENOUS
  Filled 2015-10-09: qty 100

## 2015-11-21 ENCOUNTER — Encounter: Payer: Medicare Other | Admitting: Internal Medicine

## 2015-11-21 ENCOUNTER — Encounter: Payer: Self-pay | Admitting: Internal Medicine

## 2015-11-21 ENCOUNTER — Ambulatory Visit (INDEPENDENT_AMBULATORY_CARE_PROVIDER_SITE_OTHER): Payer: Medicare Other | Admitting: Internal Medicine

## 2015-11-21 VITALS — BP 120/78 | HR 74 | Ht 65.0 in | Wt 218.8 lb

## 2015-11-21 DIAGNOSIS — Z950 Presence of cardiac pacemaker: Secondary | ICD-10-CM

## 2015-11-21 DIAGNOSIS — R000 Tachycardia, unspecified: Secondary | ICD-10-CM | POA: Diagnosis not present

## 2015-11-21 DIAGNOSIS — I10 Essential (primary) hypertension: Secondary | ICD-10-CM

## 2015-11-21 LAB — CUP PACEART INCLINIC DEVICE CHECK
Brady Statistic RA Percent Paced: 0 %
Implantable Lead Implant Date: 20040917
Implantable Lead Location: 753859
Implantable Lead Model: 4479
Implantable Lead Model: 5076
Lead Channel Pacing Threshold Amplitude: 0.5 V
Lead Channel Sensing Intrinsic Amplitude: 2.4 mV
Lead Channel Setting Pacing Amplitude: 2.4 V
Lead Channel Setting Pacing Pulse Width: 0.4 ms
MDC IDC LEAD IMPLANT DT: 20040917
MDC IDC LEAD LOCATION: 753860
MDC IDC LEAD SERIAL: 312732
MDC IDC MSMT LEADCHNL RA IMPEDANCE VALUE: 480 Ohm
MDC IDC MSMT LEADCHNL RA PACING THRESHOLD PULSEWIDTH: 0.4 ms
MDC IDC MSMT LEADCHNL RV IMPEDANCE VALUE: 440 Ohm
MDC IDC MSMT LEADCHNL RV PACING THRESHOLD AMPLITUDE: 0.9 V
MDC IDC MSMT LEADCHNL RV PACING THRESHOLD PULSEWIDTH: 0.4 ms
MDC IDC MSMT LEADCHNL RV SENSING INTR AMPL: 10.8 mV
MDC IDC SESS DTM: 20170328040000
MDC IDC SET LEADCHNL RA PACING AMPLITUDE: 2 V
MDC IDC SET LEADCHNL RV SENSING SENSITIVITY: 2.5 mV
MDC IDC STAT BRADY RV PERCENT PACED: 70 %
Pulse Gen Serial Number: 595359

## 2015-11-21 NOTE — Progress Notes (Signed)
[facility]    Electrophysiology Office Note   Date:  11/21/2015   ID:  [name], [name] [birth year], MRN [id number]  PCP:  Golden Pop, MD  Cardiologist:  none Primary Electrophysiologist:    Virl Axe, MD    Chief Complaint  Patient presents with  . other    pacemaker check and follow up. Meds reviewed by the patient verbally. "doing well."     History of Present Illness: [name] is a 65 y.o. 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  She underwent generator replacement Dec 2011  Echo 06/2010 normal LV function  She is much better since pacemaker was implanted        Today, she denies symptoms of palpitations, chest pain, shortness of breath, orthopnea, PND, lower extremity edema, claudication, dizziness, presyncope, syncope, bleeding, or neurologic sequela. The patient is tolerating medications without difficulties and is otherwise without complaint today.  Her hemoglobin A1c she says is less than 70. She is exercising. Has lost more than 10 pounds.   Past Medical History  Diagnosis Date  . Cardiomyopathy secondary   . Pacemaker   . Breast CA ([place])     Chemo/XRT/lumpectomy  . 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 Sinus tachycardia ([place])   . Hypersomnolence 09/21/2013  . Leg edema, left 09/21/2013   Past Surgical History  Procedure Laterality Date  . Cesarean section  1983  . Gallbladder surgery  1993  . Pacemaker insertion  05/13/2003    [employer] - DR  . Insert / replace / remove pacemaker  2011    revision @ [facility]  . Breast lumpectomy      left breast     Current Outpatient Prescriptions  Medication Sig Dispense Refill  . aMILoride (MIDAMOR) 5 MG tablet Take 1 tablet (5 mg total) by mouth daily. 30 tablet 7  . atenolol (TENORMIN) 50 MG tablet Take 1 tablet (50 mg total) by mouth daily. 30 tablet 12  . cetirizine (ZYRTEC) 5 MG tablet  Take 5 mg by mouth as needed for allergies. Seasonal allergies    . fluticasone (FLONASE) 50 MCG/ACT nasal spray Place 2 sprays into both nostrils daily. 16 g 12  . magnesium oxide (MAG-OX) 400 MG tablet Take 400 mg by mouth 2 (two) times daily.    . meloxicam (MOBIC) 15 MG tablet Take 1 tablet (15 mg total) by mouth daily. 30 tablet 3  . metFORMIN (GLUCOPHAGE) 500 MG tablet Take 1 tablet (500 mg total) by mouth at bedtime. 30 tablet 12  . naproxen sodium (ANAPROX) 220 MG tablet Take 220 mg by mouth 2 (two) times daily with a meal.    . NON FORMULARY chloratab as directed/ for sinus    . ranitidine (ZANTAC) 150 MG capsule Take 150 mg by mouth 2 (two) times daily.    . SUMAtriptan Succinate (IMITREX PO) Take by mouth as needed.       No current facility-administered medications for this visit.   Facility-Administered Medications Ordered in Other Visits  Medication Dose Route Frequency Provider Last Rate Last Dose  . magnesium sulfate IVPB 4 g 100 mL  4 g Intravenous Once Lloyd Huger, MD        Allergies:   Penicillins and Levaquin   Social History:  The patient  reports that she quit smoking about 34 years ago. She has never used smokeless  tobacco. She reports that she drinks alcohol. She reports that she does not use illicit drugs.   Family History:  The patient's family history includes Alcohol abuse in her father; Cancer in her father; Diabetes in her brother, daughter, father, mother, paternal grandmother, and paternal uncle; Heart disease in her mother; Hypertension in her father; Stroke in her other; Thyroid disease in her mother.    ROS:  Please see the history of present illness and past medical history  Otherwise, review of systems  is negative.    PHYSICAL EXAM: VS:  BP 120/78 mmHg  Pulse 74  Ht 5\' 5"  (1.651 m)  Wt 218 lb 12 oz (99.224 kg)  BMI 36.40 kg/m2 , BMI Body mass index is 36.4 kg/(m^2). Well developed and nourished in no acute distress HENT normal Neck  supple with JVP-flat Clear Device pocket well healed; without hematoma or erythema.  There is no tethering  Regular rate and rhythm, no murmurs or gallops Abd-soft with active BS No Clubbing cyanosis edema Skin-warm and dry A & Oriented  Grossly normal sensory and motor function   EKG:  EKG is ordered today. The ekg ordered today shows NSR 86 with intrisinc conduction PR interval 210 Device interrogation is reviewed today in detail.  See PaceArt for details.  Recent Labs: 06/19/2015: ALT 19; BUN 16; Creatinine, Ser 0.77; Platelets 210; Potassium 4.6; Sodium 144; TSH 2.130 10/09/2015: Magnesium 1.5*    Lipid Panel     Component Value Date/Time   CHOL 214* 06/19/2015 1045   TRIG 149 06/19/2015 1045   HDL 41 06/19/2015 1045   CHOLHDL 5.2* 06/19/2015 1045   LDLCALC 143* 06/19/2015 1045     Wt Readings from Last 3 Encounters:  11/21/15 218 lb 12 oz (99.224 kg)  10/02/15 215 lb (97.523 kg)  06/19/15 218 lb 7.6 oz (99.1 kg)      Other studies Reviewed: Additional studies/ records that were reviewed today include: none   Review of the above records today demonstrates: NA   ASSESSMENT AND PLAN: Dyspnea on Exertion/HFpEF  1 AV block--intermittent  Pacemaker [employer]\  Diabetes    Review including A1c is not 5.5. She is exercise. I suggest that she follow-up with her PCP; we will be possible  For her to be able to stop taking her Glucophage.  Well compensated with normal device function   Current medicines are reviewed at length with the patient today.   The patient does not have concerns regarding her medicines.  The following changes were made today:  none  Labs/ tests ordered today include:    Orders Placed This Encounter  Procedures  . EKG 12-Lead     Disposition:   FU with device clinic 12 month(s)  Signed, Virl Axe, MD  11/21/2015 10:13 AM     [facility] [address] [place] [address] [phone]  (office) [phone] (fax)

## 2015-11-21 NOTE — Patient Instructions (Signed)
Medication Instructions: - Your physician recommends that you continue on your current medications as directed. Please refer to the Current Medication list given to you today.  Labwork: - none  Procedures/Testing: - none  Follow-Up: - Your physician wants you to follow-up in: 6 months with the [facility] 1 year with Dr. Caryl Comes. You will receive a reminder letter in the mail two months in advance. If you don't receive a letter, please call our office to schedule the follow-up appointment.   Any Additional Special Instructions Will Be Listed Below (If Applicable).     If you need a refill on your cardiac medications before your next appointment, please call your pharmacy.

## 2015-11-24 ENCOUNTER — Other Ambulatory Visit: Payer: Self-pay | Admitting: Oncology

## 2015-11-24 ENCOUNTER — Ambulatory Visit: Payer: Medicare Other

## 2015-11-24 ENCOUNTER — Ambulatory Visit
Admission: RE | Admit: 2015-11-24 | Discharge: 2015-11-24 | Disposition: A | Discharge status: Home / Self Care | Payer: Medicare Other | Source: Ambulatory Visit | Attending: Oncology | Admitting: Oncology

## 2015-11-24 DIAGNOSIS — Z1231 Encounter for screening mammogram for malignant neoplasm of breast: Secondary | ICD-10-CM

## 2015-11-24 HISTORY — DX: Malignant neoplasm of unspecified site of unspecified female breast: C50.919

## 2015-12-04 ENCOUNTER — Other Ambulatory Visit: Payer: Medicare Other

## 2015-12-04 ENCOUNTER — Ambulatory Visit: Payer: Medicare Other

## 2015-12-25 DEATH — deceased

## 2016-01-19 ENCOUNTER — Other Ambulatory Visit: Payer: Self-pay | Admitting: Family Medicine

## 2016-01-29 ENCOUNTER — Inpatient Hospital Stay: Payer: Medicare Other

## 2016-01-29 ENCOUNTER — Inpatient Hospital Stay: Payer: Medicare Other | Attending: Oncology

## 2016-01-29 DIAGNOSIS — C50112 Malignant neoplasm of central portion of left female breast: Secondary | ICD-10-CM

## 2016-01-29 LAB — MAGNESIUM: MAGNESIUM: 1.7 mg/dL (ref 1.7–2.4)

## 2016-03-13 ENCOUNTER — Other Ambulatory Visit: Payer: Self-pay | Admitting: Family Medicine

## 2016-03-25 ENCOUNTER — Inpatient Hospital Stay: Payer: Medicare Other

## 2016-03-25 ENCOUNTER — Inpatient Hospital Stay: Payer: Medicare Other | Attending: Oncology

## 2016-03-25 DIAGNOSIS — C50112 Malignant neoplasm of central portion of left female breast: Secondary | ICD-10-CM

## 2016-03-25 LAB — MAGNESIUM: Magnesium: 1.7 mg/dL (ref 1.7–2.4)

## 2016-04-01 ENCOUNTER — Other Ambulatory Visit: Payer: Self-pay | Admitting: Family Medicine

## 2016-04-01 ENCOUNTER — Encounter: Payer: Self-pay | Admitting: Family Medicine

## 2016-04-01 ENCOUNTER — Ambulatory Visit (INDEPENDENT_AMBULATORY_CARE_PROVIDER_SITE_OTHER): Payer: Medicare Other | Admitting: Family Medicine

## 2016-04-01 ENCOUNTER — Other Ambulatory Visit: Payer: Self-pay

## 2016-04-01 VITALS — BP 107/71 | HR 72 | Temp 97.7°F | Ht 65.5 in | Wt 218.0 lb

## 2016-04-01 DIAGNOSIS — G620 Drug-induced polyneuropathy: Secondary | ICD-10-CM | POA: Diagnosis not present

## 2016-04-01 DIAGNOSIS — Z23 Encounter for immunization: Secondary | ICD-10-CM | POA: Diagnosis not present

## 2016-04-01 DIAGNOSIS — J0100 Acute maxillary sinusitis, unspecified: Secondary | ICD-10-CM | POA: Diagnosis not present

## 2016-04-01 DIAGNOSIS — I10 Essential (primary) hypertension: Secondary | ICD-10-CM | POA: Diagnosis not present

## 2016-04-01 DIAGNOSIS — T451X5A Adverse effect of antineoplastic and immunosuppressive drugs, initial encounter: Secondary | ICD-10-CM | POA: Diagnosis not present

## 2016-04-01 DIAGNOSIS — E119 Type 2 diabetes mellitus without complications: Secondary | ICD-10-CM | POA: Diagnosis not present

## 2016-04-01 LAB — BAYER DCA HB A1C WAIVED: HB A1C (BAYER DCA - WAIVED): 5.9 % (ref <7.0)

## 2016-04-01 LAB — HEMOGLOBIN A1C: Hemoglobin A1C: 5.9

## 2016-04-01 MED ORDER — PREGABALIN 50 MG PO CAPS: 50.0000 mg | ORAL_CAPSULE | Freq: Three times a day (TID) | ORAL | 4 refills | Status: DC

## 2016-04-01 MED ORDER — AZITHROMYCIN 250 MG PO TABS: ORAL_TABLET | ORAL | 0 refills | Status: DC

## 2016-04-01 MED ORDER — PREGABALIN 50 MG PO CAPS: 50.0000 mg | ORAL_CAPSULE | Freq: Three times a day (TID) | ORAL | 0 refills | Status: DC

## 2016-04-01 NOTE — Progress Notes (Signed)
BP 107/71 (BP Location: Right Arm, Patient Position: Sitting, Cuff Size: Normal)   Pulse 72   Temp 97.7 F (36.5 C)   Ht 5' 5.5" (1.664 m)   Wt 218 lb (98.9 kg)   SpO2 97%   BMI 35.73 kg/m    Subjective:    Patient ID: [name], female    DOB: 09-Sep-1950, 65 y.o.   MRN: [id number]  HPI: [name] is a 65 y.o. female  Chief Complaint  Patient presents with  . Diabetes  . med check  Diabetes all in all doing well no complaints low blood sugar spells or issues taking metformin 500 mg at dinnertime.  Patient's biggest concerns ongoing neuropathy and muscle ache pains throughout upper shoulder girdle chest area. And into the legs. Sometimes concerned on walking steps with leg control. 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  The pressure doing well no complaints  Reviewed patient taking Aleve and was taking meloxicam has stopped the meloxicam just taken Aleve morning and night which seems to help some will take meloxicam off patient's medicine list.  Patient also with sinus pressure congestion ongoing for a week getting worse has responded antibiotics with these classic sinusitis symptoms in the past.  Relevant past medical, surgical, family and social history reviewed and updated as indicated. Interim medical history since our last visit reviewed. Allergies and medications reviewed and updated.  Review of Systems  Constitutional: Positive for fatigue. Negative for fever.  HENT: Positive for congestion, postnasal drip, rhinorrhea, sinus pressure, sneezing and sore throat.   Respiratory: Negative.   Cardiovascular: Negative.     Per HPI unless specifically indicated above     Objective:    BP 107/71 (BP Location: Right Arm, Patient Position: Sitting, Cuff Size: Normal)   Pulse 72   Temp 97.7 F (36.5 C)   Ht 5' 5.5" (1.664 m)   Wt 218 lb  (98.9 kg)   SpO2 97%   BMI 35.73 kg/m   Wt Readings from Last 3 Encounters:  04/01/16 218 lb (98.9 kg)  11/21/15 218 lb 12 oz (99.2 kg)  10/02/15 215 lb (97.5 kg)    Physical Exam  Constitutional: She is oriented to person, place, and time. She appears well-developed and well-nourished. No distress.  HENT:  Head: Normocephalic and atraumatic.  Right Ear: Hearing normal.  Left Ear: Hearing normal.  Nose: Nose normal.  Mouth/Throat: Oropharyngeal exudate present.  Eyes: Conjunctivae and lids are normal. Right eye exhibits no discharge. Left eye exhibits no discharge. No scleral icterus.  Cardiovascular: Normal rate, regular rhythm and normal heart sounds.   Pulmonary/Chest: Effort normal and breath sounds normal. No respiratory distress. She has no wheezes. She has no rales.  Musculoskeletal: Normal range of motion.  Lymphadenopathy:    She has no cervical adenopathy.  Neurological: She is alert and oriented to person, place, and time.  Skin: Skin is intact. No rash noted.  Psychiatric: She has a normal mood and affect. Her speech is normal and behavior is normal. Judgment and thought content normal. Cognition and memory are normal.    Results for orders placed or performed in visit on 03/25/16  Magnesium  Result Value Ref Range   Magnesium 1.7 1.7 - 2.4 mg/dL      Assessment & Plan:   Problem List Items Addressed This Visit      Cardiovascular and Mediastinum  Essential hypertension    The current medical regimen is effective;  continue present plan and medications.         Endocrine   Type 2 diabetes mellitus ([place]) - Primary    The current medical regimen is effective;  continue present plan and medications.       Relevant Orders   Bayer DCA Hb A1c Waived     Nervous and Auditory   Chemotherapy-induced neuropathy ([place])    Discuss Will start Lyrica as a trial      Relevant Medications   pregabalin (LYRICA) 50 MG capsule   pregabalin (LYRICA) 50 MG capsule      Other Visit Diagnoses    Need for pneumococcal vaccination       Relevant Orders   Pneumococcal conjugate vaccine 13-valent IM (Completed)   Acute maxillary sinusitis, recurrence not specified       Discussed sinusitis care and treatment will use Z-Pak due to allergies   Relevant Medications   azithromycin (ZITHROMAX) 250 MG tablet       Follow up plan: Return in about 3 months (around 07/02/2016) for Physical Exam, Hemoglobin A1c.

## 2016-04-01 NOTE — Assessment & Plan Note (Signed)
The current medical regimen is effective;  continue present plan and medications.  

## 2016-04-01 NOTE — Patient Instructions (Addendum)
Pneumococcal Conjugate Vaccine (PCV13)   1. Why get vaccinated?  Vaccination can protect both children and adults from pneumococcal disease.  Pneumococcal disease is caused by bacteria that can spread from person to person through close contact. It can cause ear infections, and it can also lead to more serious infections of the:  · Lungs (pneumonia),  · Blood (bacteremia), and  · Covering of the brain and spinal cord (meningitis).  Pneumococcal pneumonia is most common among adults. Pneumococcal meningitis can cause deafness and brain damage, and it kills about 1 child in 10 who get it.  Anyone can get pneumococcal disease, but children under 2 years of age and adults 65 years and older, people with certain medical conditions, and cigarette smokers are at the highest risk.  Before there was a vaccine, the [place] saw:  · more than 700 cases of meningitis,  · about 13,000 blood infections,  · about 5 million ear infections, and  · about 200 deaths  in children under 5 each year from pneumococcal disease. Since vaccine became available, severe pneumococcal disease in these children has fallen by 88%.  About 18,000 older adults die of pneumococcal disease each year in the [place].  Treatment of pneumococcal infections with penicillin and other drugs is not as effective as it used to be, because some strains of the disease have become resistant to these drugs. This makes prevention of the disease, through vaccination, even more important.  2. PCV13 vaccine  Pneumococcal conjugate vaccine (called PCV13) protects against 13 types of pneumococcal bacteria.  PCV13 is routinely given to children at 2, 4, 6, and 12-15 months of age. It is also recommended for children and adults 2 to 64 years of age with certain health conditions, and for all adults 65 years of age and older. Your doctor can give you details.  3. Some people should not get this vaccine  Anyone who has ever had a life-threatening allergic reaction  to a dose of this vaccine, to an earlier pneumococcal vaccine called PCV7, or to any vaccine containing diphtheria toxoid (for example, DTaP), should not get PCV13.  Anyone with a severe allergy to any component of PCV13 should not get the vaccine. Tell your doctor if the person being vaccinated has any severe allergies.  If the person scheduled for vaccination is not feeling well, your healthcare provider might decide to reschedule the shot on another day.  4. Risks of a vaccine reaction  With any medicine, including vaccines, there is a chance of reactions. These are usually mild and go away on their own, but serious reactions are also possible.  Problems reported following PCV13 varied by age and dose in the series. The most common problems reported among children were:  · About half became drowsy after the shot, had a temporary loss of appetite, or had redness or tenderness where the shot was given.  · About 1 out of 3 had swelling where the shot was given.  · About 1 out of 3 had a mild fever, and about 1 in 20 had a fever over 102.2°F.  · Up to about 8 out of 10 became fussy or irritable.  Adults have reported pain, redness, and swelling where the shot was given; also mild fever, fatigue, headache, chills, or muscle pain.  Young children who get PCV13 along with inactivated flu vaccine at the same time may be at increased risk for seizures caused by fever. Ask your doctor for more information.  Problems that   could happen after any vaccine:  · People sometimes faint after a medical procedure, including vaccination. Sitting or lying down for about 15 minutes can help prevent fainting, and injuries caused by a fall. Tell your doctor if you feel dizzy, or have vision changes or ringing in the ears.  · Some older children and adults get severe pain in the shoulder and have difficulty moving the arm where a shot was given. This happens very rarely.  · Any medication can cause a severe allergic reaction. Such  reactions from a vaccine are very rare, estimated at about 1 in a million doses, and would happen within a few minutes to a few hours after the vaccination.  As with any medicine, there is a very small chance of a vaccine causing a serious injury or death.  The safety of vaccines is always being monitored. For more information, visit: www.cdc.gov/vaccinesafety/  5. What if there is a serious reaction?  What should I look for?  · Look for anything that concerns you, such as signs of a severe allergic reaction, very high fever, or unusual behavior.  Signs of a severe allergic reaction can include hives, swelling of the face and throat, difficulty breathing, a fast heartbeat, dizziness, and weakness-usually within a few minutes to a few hours after the vaccination.  What should I do?  · If you think it is a severe allergic reaction or other emergency that can't wait, call 9-1-1 or get the person to the nearest hospital. Otherwise, call your doctor.  Reactions should be reported to the Vaccine Adverse Event Reporting System (VAERS). Your doctor should file this report, or you can do it yourself through the VAERS web site at www.vaers.hhs.gov, or by calling 1-800-822-7967.  VAERS does not give medical advice.  6. The National Vaccine Injury Compensation Program  The National Vaccine Injury Compensation Program (VICP) is a federal program that was created to compensate people who may have been injured by certain vaccines.  Persons who believe they may have been injured by a vaccine can learn about the program and about filing a claim by calling 1-800-338-2382 or visiting the VICP website at www.hrsa.gov/vaccinecompensation. There is a time limit to file a claim for compensation.  7. How can I learn more?  · Ask your healthcare provider. He or she can give you the vaccine package insert or suggest other sources of information.  · Call your local or state health department.  · Contact the Centers for Disease Control and  Prevention (CDC):    Call 1-800-232-4636 (1-800-CDC-INFO) or    Visit CDC's website at www.cdc.gov/vaccines  Vaccine Information Statement  PCV13 Vaccine (06/30/2014)     This information is not intended to replace advice given to you by your health care provider. Make sure you discuss any questions you have with your health care provider.     Document Released: 06/09/2006 Document Revised: 09/02/2014 Document Reviewed: 07/07/2014  Elsevier Interactive Patient Education ©2016 [employer] Inc.

## 2016-04-01 NOTE — Assessment & Plan Note (Signed)
Discuss Will start Lyrica as a trial

## 2016-05-03 LAB — HM DIABETES EYE EXAM

## 2016-05-10 ENCOUNTER — Other Ambulatory Visit: Payer: Self-pay | Admitting: Family Medicine

## 2016-05-20 ENCOUNTER — Inpatient Hospital Stay: Payer: Medicare Other

## 2016-05-20 ENCOUNTER — Inpatient Hospital Stay: Payer: Medicare Other | Attending: Oncology

## 2016-05-20 DIAGNOSIS — C50112 Malignant neoplasm of central portion of left female breast: Secondary | ICD-10-CM

## 2016-05-20 LAB — MAGNESIUM: MAGNESIUM: 1.6 mg/dL — AB (ref 1.7–2.4)

## 2016-06-18 ENCOUNTER — Other Ambulatory Visit: Payer: Self-pay | Admitting: Family Medicine

## 2016-06-18 DIAGNOSIS — I10 Essential (primary) hypertension: Secondary | ICD-10-CM

## 2016-06-23 NOTE — Progress Notes (Signed)
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  INTERVAL HISTORY: Patient returns to clinic today for laboratory work and routine yearly evaluation. Currently she continues to have occasional cramping and a mild peripheral neuropathy, but otherwise feels well. She had no recent fevers or illnesses.  She denies any chest pain, shortness of breath, or hemoptysis. She has no peripheral edema.  She denies any nausea, vomiting, or constipation. She denies any weakness and fatigue. She has a good appetite and denies weight loss. She offers no further specific complaints today.  REVIEW OF SYSTEMS:   Review of Systems  Constitutional: Negative.  Negative for fever, malaise/fatigue and weight loss.  Respiratory: Negative.  Negative for cough and shortness of breath.   Cardiovascular: Negative.  Negative for chest pain and leg swelling.  Gastrointestinal: Negative.  Negative for abdominal pain.  Genitourinary: Negative.   Musculoskeletal: Negative.        Occasional muscle cramping  Neurological: Positive for sensory change. Negative for weakness.  Psychiatric/Behavioral: Negative.  The patient is not nervous/anxious.    As per HPI. Otherwise, a complete review of systems is negative.   PAST MEDICAL HISTORY: Past Medical History:  Diagnosis Date  . Breast CA ([place])    Chemo/XRT/lumpectomy  . Breast cancer ([place]) 09/2007   left breast, radiation, chemo  . Cardiomyopathy secondary   . Hypersomnolence 09/21/2013  . Leg  edema, left 09/21/2013  . 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 Pacemaker   . Sinus tachycardia     PAST SURGICAL HISTORY: Past Surgical History:  Procedure Laterality Date  . BREAST EXCISIONAL BIOPSY Left 2009   positive  . BREAST LUMPECTOMY     left breast  . CESAREAN SECTION  1983  . [facility]  . INSERT / REPLACE / REMOVE PACEMAKER  2011   revision @ [facility]  05/13/2003   [facility] Comp - DR    FAMILY HISTORY Family History  Problem Relation Age of Onset  . Diabetes Mother   . Heart disease Mother   . Thyroid disease Mother   . Hypertension Father   . Cancer Father   . Alcohol abuse Father   . Diabetes Father   . Stroke Other     Family hx of CVA or stroke  . Diabetes Brother   . Diabetes Daughter   . Diabetes Paternal Uncle   . Diabetes Paternal Grandmother   . Breast cancer Maternal Aunt        ADVANCED DIRECTIVES:    HEALTH MAINTENANCE: Social History  Substance Use Topics  . Smoking status: Former Smoker    Quit date: 08/26/1981  . Smokeless tobacco: Never Used  . Alcohol use Yes     Comment: rare     Colonoscopy:  PAP:  Bone density:  Lipid panel:  Allergies  Allergen Reactions  . Penicillins Swelling    Causes organs to swell  . Levaquin [Levofloxacin] Swelling    Leg swelling and muscle weakness    Current Outpatient Prescriptions  Medication Sig Dispense Refill  . aMILoride (MIDAMOR)  5 MG tablet TAKE ONE TABLET BY MOUTH EVERY DAY 30 tablet 0  . atenolol (TENORMIN) 50 MG tablet TAKE ONE (1) TABLET EACH DAY 30 tablet 3  . cetirizine (ZYRTEC) 5 MG tablet Take 5 mg by mouth as needed for allergies. Seasonal allergies    . CINNAMON PO Take by mouth daily.    . fluticasone (FLONASE) 50 MCG/ACT nasal spray Place 2 sprays into both nostrils daily. 16 g 12  . magnesium oxide (MAG-OX) 400 MG tablet Take 400 mg by mouth 2 (two) times daily.    . metFORMIN (GLUCOPHAGE) 500 MG tablet  Take 1 tablet (500 mg total) by mouth at bedtime. 30 tablet 12  . naproxen sodium (ANAPROX) 220 MG tablet Take 220 mg by mouth 2 (two) times daily with a meal.    . NON FORMULARY chloratab as directed/ for sinus    . pregabalin (LYRICA) 50 MG capsule Take 1 capsule (50 mg total) by mouth 3 (three) times daily. 90 capsule 4  . ranitidine (ZANTAC) 150 MG capsule Take 150 mg by mouth 2 (two) times daily.    . SUMAtriptan Succinate (IMITREX PO) Take by mouth as needed.       No current facility-administered medications for this visit.    Facility-Administered Medications Ordered in Other Visits  Medication Dose Route Frequency Provider Last Rate Last Dose  . magnesium sulfate IVPB 4 g 100 mL  4 g Intravenous Once Jeralyn Ruths, MD        OBJECTIVE: Vitals:   06/24/16 1340  BP: 124/82  Pulse: 96  Resp: 18  Temp: 98.4 F (36.9 C)     Body mass index is 37.21 kg/m.    ECOG FS:0 - Asymptomatic  General: Well-developed, well-nourished, no acute distress. Eyes: Pink conjunctiva, anicteric sclera. Breasts: Bilateral breast and axilla without lumps or masses. Lungs: Clear to auscultation bilaterally. Heart: Regular rate and rhythm. No rubs, murmurs, or gallops. Abdomen: Soft, nontender, nondistended. No organomegaly noted, normoactive bowel sounds. Musculoskeletal: No edema, cyanosis, or clubbing. Neuro: Alert, answering all questions appropriately. Cranial nerves grossly intact. Skin: No rashes or petechiae noted. Psych: Normal affect.   LAB RESULTS:  Lab Results  Component Value Date   NA 144 06/19/2015   K 4.6 06/19/2015   CL 103 06/19/2015   CO2 23 06/19/2015   GLUCOSE 106 (H) 06/19/2015   BUN 16 06/19/2015   CREATININE 0.77 06/19/2015   CALCIUM 9.7 06/19/2015   PROT 6.5 06/19/2015   ALBUMIN 4.4 06/19/2015   AST 19 06/19/2015   ALT 19 06/19/2015   ALKPHOS 85 06/19/2015   BILITOT 0.5 06/19/2015   GFRNONAA 81 06/19/2015   GFRAA 94 06/19/2015    Lab Results    Component Value Date   WBC 4.5 06/19/2015   NEUTROABS 2.6 06/19/2015   HGB 13.8 05/24/2013   HCT 40.8 06/19/2015   MCV 89 06/19/2015   PLT 210 06/19/2015     STUDIES: No results found.  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  Patient completed Herceptin on Jan 02, 2009.  Patient's most recent mammogram on November 24, 2015 was reported as BI-RADS 1.  Repeat in March of 2018.  She does not require an aromatase inhibitor given ER/PR status of her tumor.  Return to clinic in one year for repeat  laboratory work and further evaluation.     2.  Hypomagnesemia/cramping: Patient's magnesium is 1.5. Proceed with 2 g IV magnesium today.  Continue amiloride 5 mg daily and 2 tabs Slow-Mag twice a day.  Return to clinic every 3 months for laboratory work and consideration of IV magnesium infusion.  Patient will receive magnesium infusion if her levels are <1.6 or if she is symptomatic.  Patient expressed understanding and was in agreement with this plan. She also understands that She can call clinic at any time with any questions, concerns, or complaints.    Lloyd Huger, MD   06/25/2016 8:52 AM

## 2016-06-24 ENCOUNTER — Ambulatory Visit: Payer: Medicare Other | Admitting: Oncology

## 2016-06-24 ENCOUNTER — Other Ambulatory Visit: Payer: Medicare Other

## 2016-06-24 ENCOUNTER — Inpatient Hospital Stay: Payer: Medicare Other

## 2016-06-24 ENCOUNTER — Inpatient Hospital Stay: Payer: Medicare Other | Attending: Oncology | Admitting: Oncology

## 2016-06-24 VITALS — BP 124/82 | HR 96 | Temp 98.4°F | Resp 18 | Wt 227.1 lb

## 2016-06-24 DIAGNOSIS — C50912 Malignant neoplasm of unspecified site of left female breast: Secondary | ICD-10-CM

## 2016-06-24 DIAGNOSIS — G4710 Hypersomnia, unspecified: Secondary | ICD-10-CM | POA: Diagnosis not present

## 2016-06-24 DIAGNOSIS — Z809 Family history of malignant neoplasm, unspecified: Secondary | ICD-10-CM | POA: Diagnosis not present

## 2016-06-24 DIAGNOSIS — Z23 Encounter for immunization: Secondary | ICD-10-CM | POA: Diagnosis not present

## 2016-06-24 DIAGNOSIS — Z7984 Long term (current) use of oral hypoglycemic drugs: Secondary | ICD-10-CM

## 2016-06-24 DIAGNOSIS — I428 Other cardiomyopathies: Secondary | ICD-10-CM | POA: Insufficient documentation

## 2016-06-24 DIAGNOSIS — C50112 Malignant neoplasm of central portion of left female breast: Secondary | ICD-10-CM

## 2016-06-24 DIAGNOSIS — R600 Localized edema: Secondary | ICD-10-CM | POA: Diagnosis not present

## 2016-06-24 DIAGNOSIS — R252 Cramp and spasm: Secondary | ICD-10-CM | POA: Insufficient documentation

## 2016-06-24 DIAGNOSIS — R000 Tachycardia, unspecified: Secondary | ICD-10-CM | POA: Insufficient documentation

## 2016-06-24 DIAGNOSIS — Z87891 Personal history of nicotine dependence: Secondary | ICD-10-CM | POA: Diagnosis not present

## 2016-06-24 DIAGNOSIS — Z853 Personal history of malignant neoplasm of breast: Secondary | ICD-10-CM | POA: Insufficient documentation

## 2016-06-24 DIAGNOSIS — G629 Polyneuropathy, unspecified: Secondary | ICD-10-CM | POA: Diagnosis not present

## 2016-06-24 DIAGNOSIS — Z79899 Other long term (current) drug therapy: Secondary | ICD-10-CM | POA: Insufficient documentation

## 2016-06-24 DIAGNOSIS — Z923 Personal history of irradiation: Secondary | ICD-10-CM | POA: Diagnosis not present

## 2016-06-24 DIAGNOSIS — Z9221 Personal history of antineoplastic chemotherapy: Secondary | ICD-10-CM | POA: Diagnosis not present

## 2016-06-24 DIAGNOSIS — Z803 Family history of malignant neoplasm of breast: Secondary | ICD-10-CM | POA: Diagnosis not present

## 2016-06-24 LAB — MAGNESIUM: Magnesium: 1.5 mg/dL — ABNORMAL LOW (ref 1.7–2.4)

## 2016-06-24 MED ORDER — SODIUM CHLORIDE 0.9 % IV SOLN
INTRAVENOUS | Status: DC
  Administered 2016-06-24: 15:00:00 via INTRAVENOUS
  Filled 2016-06-24: qty 1000

## 2016-06-24 MED ORDER — MAGNESIUM SULFATE 2 GM/50ML IV SOLN
2.0000 g | Freq: Once | INTRAVENOUS | Status: AC
  Administered 2016-06-24: 2 g via INTRAVENOUS
  Filled 2016-06-24: qty 50

## 2016-06-24 MED ORDER — INFLUENZA VAC SPLIT QUAD 0.5 ML IM SUSY
0.5000 mL | PREFILLED_SYRINGE | Freq: Once | INTRAMUSCULAR | Status: AC
  Administered 2016-06-24: 0.5 mL via INTRAMUSCULAR
  Filled 2016-06-24: qty 0.5

## 2016-06-24 NOTE — Progress Notes (Signed)
States is feeling well. Has sinus drainage for the past few days and requests to get flu shot today. Last breast exam was performed by PCP in the spring. Pt has another appt with PCP in December for complete physical.

## 2016-07-12 ENCOUNTER — Other Ambulatory Visit: Payer: Self-pay | Admitting: Family Medicine

## 2016-07-12 DIAGNOSIS — E119 Type 2 diabetes mellitus without complications: Secondary | ICD-10-CM

## 2016-07-12 NOTE — Telephone Encounter (Signed)
Routing to provider  

## 2016-07-16 ENCOUNTER — Other Ambulatory Visit: Payer: Self-pay | Admitting: Family Medicine

## 2016-07-16 NOTE — Telephone Encounter (Signed)
Routing to provider  

## 2016-07-29 ENCOUNTER — Ambulatory Visit (INDEPENDENT_AMBULATORY_CARE_PROVIDER_SITE_OTHER): Payer: Medicare Other | Admitting: Family Medicine

## 2016-07-29 ENCOUNTER — Encounter: Payer: Self-pay | Admitting: Family Medicine

## 2016-07-29 VITALS — BP 104/66 | HR 89 | Temp 98.4°F | Ht 65.1 in | Wt 225.2 lb

## 2016-07-29 DIAGNOSIS — E119 Type 2 diabetes mellitus without complications: Secondary | ICD-10-CM

## 2016-07-29 DIAGNOSIS — E7800 Pure hypercholesterolemia, unspecified: Secondary | ICD-10-CM

## 2016-07-29 DIAGNOSIS — E785 Hyperlipidemia, unspecified: Secondary | ICD-10-CM | POA: Insufficient documentation

## 2016-07-29 DIAGNOSIS — T451X5A Adverse effect of antineoplastic and immunosuppressive drugs, initial encounter: Secondary | ICD-10-CM

## 2016-07-29 DIAGNOSIS — Z1159 Encounter for screening for other viral diseases: Secondary | ICD-10-CM | POA: Diagnosis not present

## 2016-07-29 DIAGNOSIS — G620 Drug-induced polyneuropathy: Secondary | ICD-10-CM | POA: Diagnosis not present

## 2016-07-29 DIAGNOSIS — J019 Acute sinusitis, unspecified: Secondary | ICD-10-CM

## 2016-07-29 DIAGNOSIS — C50912 Malignant neoplasm of unspecified site of left female breast: Secondary | ICD-10-CM

## 2016-07-29 DIAGNOSIS — I10 Essential (primary) hypertension: Secondary | ICD-10-CM | POA: Diagnosis not present

## 2016-07-29 LAB — URINALYSIS, ROUTINE W REFLEX MICROSCOPIC
Bilirubin, UA: NEGATIVE
GLUCOSE, UA: NEGATIVE
KETONES UA: NEGATIVE
LEUKOCYTES UA: NEGATIVE
Nitrite, UA: NEGATIVE
PROTEIN UA: NEGATIVE
RBC, UA: NEGATIVE
Specific Gravity, UA: 1.01 (ref 1.005–1.030)
Urobilinogen, Ur: 0.2 mg/dL (ref 0.2–1.0)
pH, UA: 6.5 (ref 5.0–7.5)

## 2016-07-29 LAB — BAYER DCA HB A1C WAIVED: HB A1C (BAYER DCA - WAIVED): 6.2 % (ref <7.0)

## 2016-07-29 MED ORDER — ATENOLOL 50 MG PO TABS: 50.0000 mg | ORAL_TABLET | Freq: Every day | ORAL | 12 refills | Status: DC

## 2016-07-29 MED ORDER — AMILORIDE HCL 5 MG PO TABS: 5.0000 mg | ORAL_TABLET | Freq: Every day | ORAL | 12 refills | Status: DC

## 2016-07-29 MED ORDER — METFORMIN HCL 500 MG PO TABS: 500.0000 mg | ORAL_TABLET | Freq: Every day | ORAL | 12 refills | Status: DC

## 2016-07-29 MED ORDER — FLUTICASONE PROPIONATE 50 MCG/ACT NA SUSP: 2.0000 | Freq: Every day | NASAL | 12 refills | Status: DC

## 2016-07-29 MED ORDER — PREGABALIN 50 MG PO CAPS: 50.0000 mg | ORAL_CAPSULE | Freq: Three times a day (TID) | ORAL | 5 refills | Status: DC

## 2016-07-29 MED ORDER — AZITHROMYCIN 250 MG PO TABS: ORAL_TABLET | ORAL | 0 refills | Status: DC

## 2016-07-29 NOTE — Assessment & Plan Note (Signed)
The current medical regimen is effective;  continue present plan and medications.  

## 2016-07-29 NOTE — Progress Notes (Signed)
BP 104/66 (BP Location: Left Arm, Patient Position: Sitting, Cuff Size: Large)   Pulse 89   Temp 98.4 F (36.9 C)   Ht 5' 5.1" (1.654 m)   Wt 225 lb 3.2 oz (102.2 kg)   SpO2 97%   BMI 37.36 kg/m    Subjective:    Patient ID: Gwenlyn Found, female    DOB: Feb 01, 1951, 65 y.o.   MRN: [id number]  HPI: VERLON CARCIONE is a 65 y.o. female  Chief Complaint  Patient presents with  . Medicare Wellness    pt is interested in Hep C lab  . Labs Only    Dr. Jeananne Rama, please enter labs and hep c, patient has Medicare  Patient with great deal of sinus congestion drainage as tried doxycycline 2 rounds prednisone nose sprays and inhalers all with no relief still very hoarse. Patient is time pressured as she needs to sitting in the church choir on December 16. Diabetes doing well with no low blood sugar spells no issues with medications. Blood pressure doing well with no issues. Patient following with oncology and hematology for breast mammograms though we need to do her exam today. Pacemaker doing well. Lyrica is a blessing for her neuropathy which is greatly helping.  AWV metrics met  Relevant past medical, surgical, family and social history reviewed and updated as indicated. Interim medical history since our last visit reviewed. Allergies and medications reviewed and updated.  Review of Systems  Constitutional: Negative.   HENT: Negative.   Eyes: Negative.   Respiratory: Negative.   Cardiovascular: Negative.   Gastrointestinal: Negative.   Endocrine: Negative.   Genitourinary: Negative.   Musculoskeletal: Negative.   Skin: Negative.   Allergic/Immunologic: Negative.   Neurological: Negative.   Hematological: Negative.   Psychiatric/Behavioral: Negative.     Per HPI unless specifically indicated above     Objective:    BP 104/66 (BP Location: Left Arm, Patient Position: Sitting, Cuff Size: Large)   Pulse 89   Temp 98.4 F (36.9 C)   Ht 5' 5.1" (1.654 m)   Wt 225 lb 3.2 oz  (102.2 kg)   SpO2 97%   BMI 37.36 kg/m   Wt Readings from Last 3 Encounters:  07/29/16 225 lb 3.2 oz (102.2 kg)  06/24/16 227 lb 1.2 oz (103 kg)  04/01/16 218 lb (98.9 kg)    Physical Exam  Constitutional: She is oriented to person, place, and time. She appears well-developed and well-nourished.  HENT:  Head: Normocephalic and atraumatic.  Right Ear: External ear normal.  Left Ear: External ear normal.  Nose: Nose normal.  Mouth/Throat: Oropharynx is clear and moist.  Eyes: Conjunctivae and EOM are normal. Pupils are equal, round, and reactive to light.  Neck: Normal range of motion. Neck supple. Carotid bruit is not present.  Cardiovascular: Normal rate, regular rhythm and normal heart sounds.   No murmur heard. Pulmonary/Chest: Effort normal and breath sounds normal. She exhibits no mass. Right breast exhibits no mass, no skin change and no tenderness. Left breast exhibits no mass, no skin change and no tenderness. Breasts are symmetrical.  Abdominal: Soft. Bowel sounds are normal. There is no hepatosplenomegaly.  Musculoskeletal: Normal range of motion.  Neurological: She is alert and oriented to person, place, and time.  Skin: No rash noted.  Psychiatric: She has a normal mood and affect. Her behavior is normal. Judgment and thought content normal.    Results for orders placed or performed in visit on 06/24/16  Magnesium  Result Value Ref Range   Magnesium 1.5 (L) 1.7 - 2.4 mg/dL      Assessment & Plan:   Problem List Items Addressed This Visit      Cardiovascular and Mediastinum   Essential hypertension    The current medical regimen is effective;  continue present plan and medications.       Relevant Medications   atenolol (TENORMIN) 50 MG tablet   aMILoride (MIDAMOR) 5 MG tablet   Other Relevant Orders   Comprehensive metabolic panel   Lipid panel   CBC with Differential/Platelet   TSH   Urinalysis, Routine w reflex microscopic (not at [facility])     Endocrine    Type 2 diabetes mellitus ([place]) - Primary    The current medical regimen is effective;  continue present plan and medications.       Relevant Medications   metFORMIN (GLUCOPHAGE) 500 MG tablet   Other Relevant Orders   Bayer DCA Hb A1c Waived   Comprehensive metabolic panel   Lipid panel   CBC with Differential/Platelet   TSH   Urinalysis, Routine w reflex microscopic (not at [facility])     Nervous and Auditory   Chemotherapy-induced neuropathy ([place])    The current medical regimen is effective;  continue present plan and medications.       Relevant Medications   pregabalin (LYRICA) 50 MG capsule   Other Relevant Orders   Lipid panel   CBC with Differential/Platelet     Other   Primary cancer of left female breast [facility])    The current medical regimen is effective;  continue present plan and medications.       Relevant Medications   azithromycin (ZITHROMAX) 250 MG tablet   Other Relevant Orders   TSH   Hypercholesteremia   Relevant Medications   atenolol (TENORMIN) 50 MG tablet   aMILoride (MIDAMOR) 5 MG tablet   Other Relevant Orders   Lipid panel    Other Visit Diagnoses    Acute sinusitis, recurrence not specified, unspecified location       Discussed sinusitis care and treatment will give Z-Pak. If not better due to time pressure will refer to ear nose and throat   Relevant Medications   azithromycin (ZITHROMAX) 250 MG tablet   fluticasone (FLONASE) 50 MCG/ACT nasal spray   Encounter for hepatitis C screening test for low risk patient       Relevant Orders   Hepatitis C antibody       Follow up plan: No Follow-up on file.

## 2016-07-30 ENCOUNTER — Other Ambulatory Visit: Payer: Self-pay

## 2016-07-30 ENCOUNTER — Encounter: Payer: Self-pay | Admitting: Family Medicine

## 2016-07-30 DIAGNOSIS — Z1211 Encounter for screening for malignant neoplasm of colon: Secondary | ICD-10-CM

## 2016-07-30 LAB — COMPREHENSIVE METABOLIC PANEL
ALT: 17 IU/L (ref 0–32)
AST: 18 IU/L (ref 0–40)
Albumin/Globulin Ratio: 1.9 (ref 1.2–2.2)
Albumin: 4.3 g/dL (ref 3.6–4.8)
Alkaline Phosphatase: 92 IU/L (ref 39–117)
BILIRUBIN TOTAL: 0.3 mg/dL (ref 0.0–1.2)
BUN / CREAT RATIO: 22 (ref 12–28)
BUN: 17 mg/dL (ref 8–27)
CHLORIDE: 100 mmol/L (ref 96–106)
CO2: 24 mmol/L (ref 18–29)
Calcium: 9.7 mg/dL (ref 8.7–10.3)
Creatinine, Ser: 0.76 mg/dL (ref 0.57–1.00)
GFR, EST AFRICAN AMERICAN: 95 mL/min/{1.73_m2} (ref >59)
GFR, EST NON AFRICAN AMERICAN: 82 mL/min/{1.73_m2} (ref >59)
GLUCOSE: 121 mg/dL — AB (ref 65–99)
Globulin, Total: 2.3 g/dL (ref 1.5–4.5)
Potassium: 4.6 mmol/L (ref 3.5–5.2)
Sodium: 141 mmol/L (ref 134–144)
TOTAL PROTEIN: 6.6 g/dL (ref 6.0–8.5)

## 2016-07-30 LAB — CBC WITH DIFFERENTIAL/PLATELET
BASOS ABS: 0 10*3/uL (ref 0.0–0.2)
Basos: 1 %
EOS (ABSOLUTE): 0.2 10*3/uL (ref 0.0–0.4)
EOS: 3 %
Hematocrit: 41.3 % (ref 34.0–46.6)
Hemoglobin: 13.7 g/dL (ref 11.1–15.9)
IMMATURE GRANULOCYTES: 1 %
Immature Grans (Abs): 0.1 10*3/uL (ref 0.0–0.1)
Lymphocytes Absolute: 2.1 10*3/uL (ref 0.7–3.1)
Lymphs: 34 %
MCH: 30.2 pg (ref 26.6–33.0)
MCHC: 33.2 g/dL (ref 31.5–35.7)
MCV: 91 fL (ref 79–97)
MONOS ABS: 0.4 10*3/uL (ref 0.1–0.9)
Monocytes: 7 %
NEUTROS PCT: 54 %
Neutrophils Absolute: 3.3 10*3/uL (ref 1.4–7.0)
PLATELETS: 213 10*3/uL (ref 150–379)
RBC: 4.53 x10E6/uL (ref 3.77–5.28)
RDW: 13.9 % (ref 12.3–15.4)
WBC: 6.1 10*3/uL (ref 3.4–10.8)

## 2016-07-30 LAB — LIPID PANEL
CHOL/HDL RATIO: 5 ratio — AB (ref 0.0–4.4)
Cholesterol, Total: 228 mg/dL — ABNORMAL HIGH (ref 100–199)
HDL: 46 mg/dL (ref >39)
LDL CALC: 119 mg/dL — AB (ref 0–99)
Triglycerides: 314 mg/dL — ABNORMAL HIGH (ref 0–149)
VLDL CHOLESTEROL CAL: 63 mg/dL — AB (ref 5–40)

## 2016-07-30 LAB — HEPATITIS C ANTIBODY

## 2016-07-30 LAB — TSH: TSH: 1.97 u[IU]/mL (ref 0.450–4.500)

## 2016-08-21 ENCOUNTER — Telehealth: Payer: Self-pay | Admitting: Internal Medicine

## 2016-08-21 NOTE — Telephone Encounter (Signed)
Spoke with patient.  She is agreeable to [facility] appointment on 09/17/16 at 12:30pm.  Patient is appreciative of assistance and denies additional questions or concerns at this time.

## 2016-08-21 NOTE — Telephone Encounter (Signed)
Patient was due for a device check in September 2017.  Is there a Tuesday morning after 9 am where she can be worked in ?

## 2016-08-31 LAB — COLOGUARD: Cologuard: NEGATIVE

## 2016-09-16 ENCOUNTER — Inpatient Hospital Stay: Payer: Medicare Other

## 2016-09-16 ENCOUNTER — Inpatient Hospital Stay: Payer: Medicare Other | Attending: Oncology

## 2016-09-16 DIAGNOSIS — C50112 Malignant neoplasm of central portion of left female breast: Secondary | ICD-10-CM

## 2016-09-16 LAB — MAGNESIUM: MAGNESIUM: 1.6 mg/dL — AB (ref 1.7–2.4)

## 2016-09-17 ENCOUNTER — Ambulatory Visit (INDEPENDENT_AMBULATORY_CARE_PROVIDER_SITE_OTHER): Payer: Medicare Other | Admitting: *Deleted

## 2016-09-17 DIAGNOSIS — Z950 Presence of cardiac pacemaker: Secondary | ICD-10-CM

## 2016-09-17 LAB — CUP PACEART INCLINIC DEVICE CHECK
Brady Statistic RA Percent Paced: 0 %
Brady Statistic RV Percent Paced: 68 %
Date Time Interrogation Session: 20180123050000
Implantable Lead Implant Date: 20040917
Implantable Lead Implant Date: 20040917
Implantable Lead Location: 753859
Implantable Lead Location: 753860
Implantable Lead Model: 4479
Implantable Lead Model: 5076
Implantable Lead Serial Number: 312732
Implantable Pulse Generator Implant Date: 20111209
Lead Channel Impedance Value: 440 Ohm
Lead Channel Impedance Value: 470 Ohm
Lead Channel Pacing Threshold Amplitude: 0.5 V
Lead Channel Pacing Threshold Amplitude: 0.9 V
Lead Channel Pacing Threshold Pulse Width: 0.4 ms
Lead Channel Pacing Threshold Pulse Width: 0.4 ms
Lead Channel Sensing Intrinsic Amplitude: 1.2 mV
Lead Channel Sensing Intrinsic Amplitude: 10.4 mV
Lead Channel Setting Pacing Amplitude: 2 V
Lead Channel Setting Pacing Amplitude: 2.4 V
Lead Channel Setting Pacing Pulse Width: 0.4 ms
Lead Channel Setting Sensing Sensitivity: 2.5 mV
Pulse Gen Serial Number: 595359

## 2016-09-17 NOTE — Progress Notes (Signed)
Pacemaker check in clinic. Normal device function. Thresholds, sensing, impedances consistent with previous measurements. Device programmed to maximize longevity. 1 mode switch (0%), AT, duration 5 sec. No high ventricular rates noted. Device programmed at appropriate safety margins. Histogram distribution appropriate for patient activity level. Device programmed to optimize intrinsic conduction. Estimated longevity 5.0 years. Patient education completed. ROV with SK/B on 11/26/16.

## 2016-09-23 ENCOUNTER — Ambulatory Visit: Payer: Medicare Other

## 2016-09-23 ENCOUNTER — Other Ambulatory Visit: Payer: Medicare Other

## 2016-10-07 ENCOUNTER — Encounter: Payer: Self-pay | Admitting: Family Medicine

## 2016-10-07 ENCOUNTER — Ambulatory Visit (INDEPENDENT_AMBULATORY_CARE_PROVIDER_SITE_OTHER): Payer: Medicare Other | Admitting: Family Medicine

## 2016-10-07 VITALS — BP 138/80 | HR 77 | Temp 98.2°F | Ht 67.0 in | Wt 230.0 lb

## 2016-10-07 DIAGNOSIS — Z0000 Encounter for general adult medical examination without abnormal findings: Secondary | ICD-10-CM | POA: Diagnosis not present

## 2016-10-07 NOTE — Progress Notes (Signed)
BP 138/80   Pulse 77   Temp 98.2 F (36.8 C) (Oral)   Ht '5\' 7"'  (1.702 m)   Wt 230 lb (104.3 kg)   SpO2 98%   BMI 36.02 kg/m    Subjective:    Patient ID: [name], female    DOB: 04/26/1951, 66 y.o.   MRN: [id number]  HPI: [name] is a 66 y.o. female  Chief Complaint  Patient presents with  . Medicare Wellness  Patient with no complaints reviewed wellness exam from December and insurance companies insisted the patient to have a repeat wellness exam. Patient all in all feels well with no complaints taking medications without problems had cold went away that she was treated for last visit. Diabetes stable peripheral neuropathy no change other medical illnesses stable. Has follow-up appointment in March for diabetes Annual wellness visit Metrix met.   Relevant past medical, surgical, family and social history reviewed and updated as indicated. Interim medical history since our last visit reviewed. Allergies and medications reviewed and updated.  Review of Systems  Constitutional: Negative.   HENT: Negative.   Eyes: Negative.   Respiratory: Negative.   Cardiovascular: Negative.   Gastrointestinal: Negative.   Endocrine: Negative.   Genitourinary: Negative.   Musculoskeletal: Negative.   Skin: Negative.   Allergic/Immunologic: Negative.   Neurological: Negative.   Hematological: Negative.   Psychiatric/Behavioral: Negative.     Per HPI unless specifically indicated above     Objective:    BP 138/80   Pulse 77   Temp 98.2 F (36.8 C) (Oral)   Ht '5\' 7"'  (1.702 m)   Wt 230 lb (104.3 kg)   SpO2 98%   BMI 36.02 kg/m   Wt Readings from Last 3 Encounters:  10/07/16 230 lb (104.3 kg)  07/29/16 225 lb 3.2 oz (102.2 kg)  06/24/16 227 lb 1.2 oz (103 kg)    Physical Exam  Constitutional: She is oriented to person, place, and time. She appears well-developed and well-nourished. No distress.  HENT:  Head: Normocephalic and atraumatic.  Right Ear: Hearing normal.   Left Ear: Hearing normal.  Nose: Nose normal.  Eyes: Conjunctivae and lids are normal. Right eye exhibits no discharge. Left eye exhibits no discharge. No scleral icterus.  Cardiovascular: Normal rate, regular rhythm and normal heart sounds.   Pulmonary/Chest: Effort normal and breath sounds normal. No respiratory distress.  Musculoskeletal: Normal range of motion.  Neurological: She is alert and oriented to person, place, and time.  Skin: Skin is intact. No rash noted.  Psychiatric: She has a normal mood and affect. Her speech is normal and behavior is normal. Judgment and thought content normal. Cognition and memory are normal.    Results for orders placed or performed in visit on 09/17/16  CUP Gordon  Result Value Ref Range   Date Time Interrogation Session [id number]    Pulse Generator Manufacturer BOST    Pulse Gen Model S404 ALTRUA 40    Pulse Gen Serial Number [id number]    Clinic Name [facility]    Implantable Pulse Generator Type Implantable Pulse Generator    Implantable Pulse Generator Implant Date 41740814    Implantable Lead Manufacturer BOST    Implantable Lead Model 4479 Tharon Aquas    Implantable Lead Serial Number [id number]    Implantable Lead Implant Date 48185631    Implantable Lead Location [id number]    Implantable Lead Manufacturer MERM    Implantable Lead Model 5076 CapSureFix Novus  Implantable Lead Serial Number [id number]    Implantable Lead Implant Date 65465035    Implantable Lead Location [id number]    Lead Channel Setting Sensing Sensitivity 2.5 mV   Lead Channel Setting Sensing Adaptation Mode Fixed Pacing    Lead Channel Setting Pacing Amplitude 2.0 V   Lead Channel Setting Pacing Pulse Width 0.40 ms   Lead Channel Setting Pacing Amplitude 2.4 V   Lead Channel Impedance Value 470 ohm   Lead Channel Sensing Intrinsic Amplitude 1.2 mV   Lead Channel Pacing Threshold Amplitude 0.5 V   Lead Channel Pacing Threshold Pulse  Width 0.40 ms   Lead Channel Impedance Value 440 ohm   Lead Channel Sensing Intrinsic Amplitude 10.4 mV   Lead Channel Pacing Threshold Amplitude 0.9 V   Lead Channel Pacing Threshold Pulse Width 0.40 ms   Battery Status BOS    Brady Statistic RA Percent Paced 0 %   Brady Statistic RV Percent Paced 68 %   Eval Rhythm HB to SR       Assessment & Plan:   Problem List Items Addressed This Visit    None    Visit Diagnoses    Annual physical exam    -  Primary   AWV done and stable       Follow up plan: Return in about 4 weeks (around 11/04/2016) for Hemoglobin A1c.

## 2016-11-18 ENCOUNTER — Encounter: Payer: Self-pay | Admitting: Family Medicine

## 2016-11-18 ENCOUNTER — Ambulatory Visit (INDEPENDENT_AMBULATORY_CARE_PROVIDER_SITE_OTHER): Payer: Medicare Other | Admitting: Family Medicine

## 2016-11-18 VITALS — BP 124/82 | HR 75 | Resp 16 | Wt 228.1 lb

## 2016-11-18 DIAGNOSIS — E119 Type 2 diabetes mellitus without complications: Secondary | ICD-10-CM

## 2016-11-18 DIAGNOSIS — E7800 Pure hypercholesterolemia, unspecified: Secondary | ICD-10-CM | POA: Diagnosis not present

## 2016-11-18 DIAGNOSIS — I10 Essential (primary) hypertension: Secondary | ICD-10-CM | POA: Diagnosis not present

## 2016-11-18 LAB — HEMOGLOBIN A1C
Est. average glucose Bld gHb Est-mCnc: 128 mg/dL
Hgb A1c MFr Bld: 6.1 % — ABNORMAL HIGH (ref 4.8–5.6)

## 2016-11-18 NOTE — Progress Notes (Signed)
   BP 124/82   Pulse 75   Resp 16   Wt 228 lb 1.6 oz (103.5 kg)   SpO2 99%   BMI 35.73 kg/m    Subjective:    Patient ID: [name], female    DOB: 04-19-1951, 66 y.o.   MRN: [id number]  HPI: [name] is a 66 y.o. female  Chief Complaint  Patient presents with  . Follow-up  . Diabetes  Patient follow-up diabetes noted low blood sugar spells problems with metformin taking faithfully without issues Has develops new issue is some C7 area discomfort some slight swelling seemed to develop in the last couple of days when noticed with driving when headrest at the C7 area. No radicular symptoms. Blood pressure doing well is working on weight again.  Relevant past medical, surgical, family and social history reviewed and updated as indicated. Interim medical history since our last visit reviewed. Allergies and medications reviewed and updated.  Review of Systems  Constitutional: Negative.   Respiratory: Negative.   Cardiovascular: Negative.     Per HPI unless specifically indicated above     Objective:    BP 124/82   Pulse 75   Resp 16   Wt 228 lb 1.6 oz (103.5 kg)   SpO2 99%   BMI 35.73 kg/m   Wt Readings from Last 3 Encounters:  11/18/16 228 lb 1.6 oz (103.5 kg)  10/07/16 230 lb (104.3 kg)  07/29/16 225 lb 3.2 oz (102.2 kg)    Physical Exam  Constitutional: She is oriented to person, place, and time. She appears well-developed and well-nourished.  HENT:  Head: Normocephalic and atraumatic.  Eyes: Conjunctivae and EOM are normal.  Neck: Normal range of motion.  Cardiovascular: Normal rate, regular rhythm and normal heart sounds.   Pulmonary/Chest: Effort normal and breath sounds normal.  Musculoskeletal: Normal range of motion.  Neurological: She is alert and oriented to person, place, and time.  Skin: No erythema.  Psychiatric: She has a normal mood and affect. Her behavior is normal. Judgment and thought content normal.    Results for orders placed or  performed in visit on 10/22/16  Cologuard  Result Value Ref Range   Cologuard Negative       Assessment & Plan:   Problem List Items Addressed This Visit      Cardiovascular and Mediastinum   Essential hypertension - Primary    The current medical regimen is effective;  continue present plan and medications.       Relevant Orders   Hemoglobin A1c     Endocrine   Type 2 diabetes mellitus ([place])    The current medical regimen is effective;  continue present plan and medications.       Relevant Orders   Hemoglobin A1c     Other   Hypercholesteremia   Relevant Orders   Hemoglobin A1c       Follow up plan: Return in about 3 months (around 02/18/2017) for Hemoglobin A1c, BMP,  Lipids, ALT, AST.

## 2016-11-18 NOTE — Assessment & Plan Note (Signed)
The current medical regimen is effective;  continue present plan and medications.  

## 2016-11-25 ENCOUNTER — Ambulatory Visit: Payer: Medicare Other

## 2016-11-26 ENCOUNTER — Ambulatory Visit (INDEPENDENT_AMBULATORY_CARE_PROVIDER_SITE_OTHER): Payer: Medicare Other | Admitting: Internal Medicine

## 2016-11-26 ENCOUNTER — Encounter: Payer: Self-pay | Admitting: Internal Medicine

## 2016-11-26 VITALS — BP 124/70 | HR 77 | Ht 65.0 in | Wt 230.0 lb

## 2016-11-26 DIAGNOSIS — Z950 Presence of cardiac pacemaker: Secondary | ICD-10-CM

## 2016-11-26 DIAGNOSIS — I10 Essential (primary) hypertension: Secondary | ICD-10-CM | POA: Diagnosis not present

## 2016-11-26 DIAGNOSIS — I440 Atrioventricular block, first degree: Secondary | ICD-10-CM | POA: Diagnosis not present

## 2016-11-26 LAB — CUP PACEART INCLINIC DEVICE CHECK
Brady Statistic RV Percent Paced: 70 %
Implantable Lead Location: 753860
Implantable Lead Model: 5076
Implantable Lead Serial Number: 312732
Lead Channel Impedance Value: 480 Ohm
Lead Channel Pacing Threshold Amplitude: 0.5 V
Lead Channel Pacing Threshold Pulse Width: 0.4 ms
Lead Channel Sensing Intrinsic Amplitude: 2.5 mV
Lead Channel Setting Pacing Amplitude: 2 V
Lead Channel Setting Pacing Pulse Width: 0.4 ms
Lead Channel Setting Sensing Sensitivity: 2.5 mV
MDC IDC LEAD IMPLANT DT: 20040917
MDC IDC LEAD IMPLANT DT: 20040917
MDC IDC LEAD LOCATION: 753859
MDC IDC MSMT LEADCHNL RA PACING THRESHOLD PULSEWIDTH: 0.4 ms
MDC IDC MSMT LEADCHNL RV IMPEDANCE VALUE: 440 Ohm
MDC IDC MSMT LEADCHNL RV PACING THRESHOLD AMPLITUDE: 0.9 V
MDC IDC MSMT LEADCHNL RV SENSING INTR AMPL: 10.4 mV
MDC IDC PG IMPLANT DT: 20111209
MDC IDC SESS DTM: 20180403040000
MDC IDC SET LEADCHNL RV PACING AMPLITUDE: 2.4 V
MDC IDC STAT BRADY RA PERCENT PACED: 0 %
Pulse Gen Serial Number: 595359

## 2016-11-26 NOTE — Progress Notes (Signed)
Man    Electrophysiology Office Note   Date:  11/26/2016   ID:  [name] 14, 1952, MRN [id number]  PCP:  Golden Pop, MD  Cardiologist:  none Primary Electrophysiologist:    Virl Axe, MD    Chief Complaint  Patient presents with  . other    12 month follow up. Patient c/o SOB but she does not think it is cardiac related. Meds revoewed verbally with patient.      History of Present Illness: [name] is a 66 y.o. 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  She underwent generator replacement Dec 2011   Echo 06/2010 normal LV function    Mild shortness of breath which has been stable for some time;  no chest pain  She has chronic left leg edema which is mild    Past Medical History:  Diagnosis Date  . Breast CA ([place])    Chemo/XRT/lumpectomy  . Breast cancer ([place]) 09/2007   left breast, radiation, chemo  . Cardiomyopathy secondary   . Hypersomnolence 09/21/2013  . Leg edema, left 09/21/2013  . 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 Pacemaker   . Sinus tachycardia    Past Surgical History:  Procedure Laterality Date  . BREAST EXCISIONAL BIOPSY Left 2009   positive  . BREAST LUMPECTOMY     left breast  . CESAREAN SECTION  1983  . [place]  . INSERT / REPLACE / REMOVE PACEMAKER  2011   revision @ [facility]  05/13/2003   Maeola Sarah - DR     Current Outpatient Prescriptions  Medication Sig Dispense Refill  . aMILoride (MIDAMOR) 5 MG tablet Take 1 tablet (5 mg total) by mouth daily. 30 tablet 12  . atenolol (TENORMIN) 50 MG tablet Take 1 tablet (50 mg total) by mouth daily. 30 tablet 12  . cetirizine (ZYRTEC) 5 MG tablet Take 5 mg by mouth as needed for allergies. Seasonal allergies    . CINNAMON PO Take by mouth daily.    . fluticasone (FLONASE) 50 MCG/ACT nasal spray Place 2 sprays into both  nostrils daily. 16 g 12  . magnesium oxide (MAG-OX) 400 MG tablet Take 400 mg by mouth 2 (two) times daily.    . metFORMIN (GLUCOPHAGE) 500 MG tablet Take 1 tablet (500 mg total) by mouth at bedtime. 30 tablet 12  . naproxen sodium (ANAPROX) 220 MG tablet Take 220 mg by mouth 2 (two) times daily with a meal.    . NON FORMULARY chloratab as directed/ for sinus    . pregabalin (LYRICA) 50 MG capsule Take 1 capsule (50 mg total) by mouth 3 (three) times daily. 90 capsule 5  . ranitidine (ZANTAC) 150 MG capsule Take 150 mg by mouth 2 (two) times daily.    . SUMAtriptan Succinate (IMITREX PO) Take by mouth as needed.       No current facility-administered medications for this visit.    Facility-Administered Medications Ordered in Other Visits  Medication Dose Route Frequency Provider Last Rate Last Dose  . magnesium sulfate IVPB 4 g 100 mL  4 g Intravenous Once Lloyd Huger, MD        Allergies:   Penicillins and Levaquin [levofloxacin]   Social History:  The patient  reports that she quit smoking about 35 years ago. She has never used smokeless tobacco. She  reports that she drinks alcohol. She reports that she does not use drugs.   Family History:  The patient's family history includes Alcohol abuse in her father; Breast cancer in her maternal aunt; Cancer in her father; Diabetes in her brother, daughter, father, mother, paternal grandmother, and paternal uncle; Heart disease in her mother; Hypertension in her father; Stroke in her other; Thyroid disease in her mother.    ROS:  Please see the history of present illness and past medical history  Otherwise, review of systems  is negative.    PHYSICAL EXAM: VS:  BP 124/70 (BP Location: Left Arm, Patient Position: Sitting, Cuff Size: Normal)   Pulse 77   Ht 5\' 5"  (1.651 m)   Wt 230 lb (104.3 kg)   BMI 38.27 kg/m  , BMI Body mass index is 38.27 kg/m. Well developed and nourished in no acute distress HENT normal Neck supple   Clear Device pocket well healed; without hematoma or erythema.  There is no tethering  Regular rate and rhythm, no murmurs or gallops Abd-soft with active BS No Clubbing cyanosis trace left-sided edema Skin-warm and dry A & Oriented  Grossly normal sensory and motor function   ECG personally reviewed  NSR 77  with intrisinc conduction PR interval 260 Intermittent  fusion pacing Device interrogation is reviewed today in detail.  See PaceArt for details.  Recent Labs: 07/29/2016: ALT 17; BUN 17; Creatinine, Ser 0.76; Platelets 213; Potassium 4.6; Sodium 141; TSH 1.970 09/16/2016: Magnesium 1.6    Lipid Panel     Component Value Date/Time   CHOL 228 (H) 07/29/2016 1502   TRIG 314 (H) 07/29/2016 1502   HDL 46 07/29/2016 1502   CHOLHDL 5.0 (H) 07/29/2016 1502   LDLCALC 119 (H) 07/29/2016 1502     Wt Readings from Last 3 Encounters:  11/26/16 230 lb (104.3 kg)  11/18/16 228 lb 1.6 oz (103.5 kg)  10/07/16 230 lb (104.3 kg)      Other studies Reviewed: Additional studies/ records that were reviewed today include: none   Review of the above records today demonstrates: NA   ASSESSMENT AND PLAN: Dyspnea on Exertion/HFpEF  1 AV block--intermittent  Hypertension  Diabetes  Pacemaker [employer] The patient's device was interrogated and the information was fully reviewed.  The device was reprogrammed to lengthen AV delay to try to promote intrinsic conduction  Euvolemic continue current meds  Blood pressure is reasonably controlled. I wonder she would not benefit from an ARB/Ace given her diabetes. I will defer this to Dr. Jeananne Rama  Hemoglobin A1c is 6.1. Quite good. She is exercising.  .    Current medicines are reviewed at length with the patient today.   The patient does not have concerns regarding her medicines.  The following changes were made today:  none  Labs/ tests ordered today include:    No orders of the defined types were placed in this  encounter.    Disposition:   FU with device clinic 12 month(s)  Signed, Virl Axe, MD  11/26/2016 10:27 AM     [place] [address] [phone] (office) [phone] (fax)

## 2016-11-26 NOTE — Patient Instructions (Signed)
Medication Instructions: - Your physician recommends that you continue on your current medications as directed. Please refer to the Current Medication list given to you today.  Labwork: - none ordered  Procedures/Testing: - none ordered  Follow-Up: - Your physician recommends that you schedule a follow-up appointment in: 6 months with Raquel Sarna in the [place] physician wants you to follow-up in: 1 year with Dr. Caryl Comes. You will receive a reminder letter in the mail two months in advance. If you don't receive a letter, please call our office to schedule the follow-up appointment.   Any Additional Special Instructions Will Be Listed Below (If Applicable).     If you need a refill on your cardiac medications before your next appointment, please call your pharmacy.

## 2016-12-16 ENCOUNTER — Ambulatory Visit
Admission: RE | Admit: 2016-12-16 | Discharge: 2016-12-16 | Disposition: A | Discharge status: Home / Self Care | Payer: Medicare Other | Source: Ambulatory Visit | Attending: Oncology | Admitting: Oncology

## 2016-12-16 DIAGNOSIS — Z1231 Encounter for screening mammogram for malignant neoplasm of breast: Secondary | ICD-10-CM | POA: Diagnosis present

## 2016-12-16 DIAGNOSIS — C50912 Malignant neoplasm of unspecified site of left female breast: Secondary | ICD-10-CM

## 2016-12-16 HISTORY — DX: Personal history of irradiation: Z92.3

## 2016-12-16 HISTORY — DX: Personal history of antineoplastic chemotherapy: Z92.21

## 2016-12-23 ENCOUNTER — Inpatient Hospital Stay: Payer: Medicare Other | Attending: Oncology | Admitting: *Deleted

## 2016-12-23 ENCOUNTER — Inpatient Hospital Stay: Payer: Medicare Other

## 2016-12-23 LAB — MAGNESIUM: MAGNESIUM: 1.7 mg/dL (ref 1.7–2.4)

## 2017-02-08 ENCOUNTER — Other Ambulatory Visit: Payer: Self-pay | Admitting: Family Medicine

## 2017-02-08 DIAGNOSIS — T451X5A Adverse effect of antineoplastic and immunosuppressive drugs, initial encounter: Principal | ICD-10-CM

## 2017-02-08 DIAGNOSIS — G620 Drug-induced polyneuropathy: Secondary | ICD-10-CM

## 2017-02-09 NOTE — Telephone Encounter (Signed)
Meg OK to fill rx

## 2017-02-14 ENCOUNTER — Telehealth: Payer: Self-pay | Admitting: Family Medicine

## 2017-02-14 NOTE — Telephone Encounter (Signed)
FYI: Pharmacy called in regards to patient changing pharmacy. Pharmacy called to verify lyrica medication due to patient having a copy of medication to give to pharmacy.

## 2017-02-24 ENCOUNTER — Ambulatory Visit (INDEPENDENT_AMBULATORY_CARE_PROVIDER_SITE_OTHER): Payer: Medicare Other | Admitting: Family Medicine

## 2017-02-24 ENCOUNTER — Encounter: Payer: Self-pay | Admitting: Family Medicine

## 2017-02-24 VITALS — BP 134/70 | HR 75 | Wt 230.4 lb

## 2017-02-24 DIAGNOSIS — E119 Type 2 diabetes mellitus without complications: Secondary | ICD-10-CM

## 2017-02-24 DIAGNOSIS — G620 Drug-induced polyneuropathy: Secondary | ICD-10-CM | POA: Diagnosis not present

## 2017-02-24 DIAGNOSIS — I10 Essential (primary) hypertension: Secondary | ICD-10-CM

## 2017-02-24 DIAGNOSIS — E7800 Pure hypercholesterolemia, unspecified: Secondary | ICD-10-CM

## 2017-02-24 DIAGNOSIS — T451X5A Adverse effect of antineoplastic and immunosuppressive drugs, initial encounter: Secondary | ICD-10-CM

## 2017-02-24 LAB — LP+ALT+AST PICCOLO, WAIVED
ALT (SGPT) Piccolo, Waived: 24 U/L (ref 10–47)
AST (SGOT) PICCOLO, WAIVED: 22 U/L (ref 11–38)
CHOL/HDL RATIO PICCOLO,WAIVE: 4.4 mg/dL
CHOLESTEROL PICCOLO, WAIVED: 221 mg/dL — AB (ref <200)
HDL CHOL PICCOLO, WAIVED: 50 mg/dL — AB (ref >59)
LDL Chol Calc Piccolo Waived: 126 mg/dL — ABNORMAL HIGH (ref <100)
Triglycerides Piccolo,Waived: 227 mg/dL — ABNORMAL HIGH (ref <150)
VLDL Chol Calc Piccolo,Waive: 45 mg/dL — ABNORMAL HIGH (ref <30)

## 2017-02-24 LAB — BAYER DCA HB A1C WAIVED: HB A1C (BAYER DCA - WAIVED): 5.8 % (ref <7.0)

## 2017-02-24 MED ORDER — PREGABALIN 50 MG PO CAPS: 50.0000 mg | ORAL_CAPSULE | Freq: Three times a day (TID) | ORAL | 5 refills | Status: DC

## 2017-02-24 NOTE — Assessment & Plan Note (Signed)
The current medical regimen is effective;  continue present plan and medications.  

## 2017-02-24 NOTE — Progress Notes (Signed)
BP 134/70   Pulse 75   Wt 230 lb 6.4 oz (104.5 kg)   SpO2 97%   BMI 38.34 kg/m    Subjective:    Patient ID: [name], female    DOB: 1951-02-09, 66 y.o.   MRN: [id number]  HPI: [name] is a 66 y.o. female  Chief Complaint  Patient presents with  . Follow-up  . Diabetes  . Hypertension  . Weight Gain    4 lbs in 4 days  Patient follow-up doing well migraines doing really well hasn't taken Imitrex in probably a year Diabetes doing well noted low blood sugar spells or issues no problems with medications. Patient taking Lyrica for neuropathy which seems to be doing well wants to have refills as it's doing well with no side effects. Patient doing well with other medications and no complaints. Patient has noticed a 4 pound weight gain over the last 4 days. Hasn't noticed any change in diet soft status etc. hasn't had any real swelling of her legs or any change. No PND orthopnea.   Relevant past medical, surgical, family and social history reviewed and updated as indicated. Interim medical history since our last visit reviewed. Allergies and medications reviewed and updated.  Review of Systems  Constitutional: Negative.   Respiratory: Negative.   Cardiovascular: Negative.     Per HPI unless specifically indicated above     Objective:    BP 134/70   Pulse 75   Wt 230 lb 6.4 oz (104.5 kg)   SpO2 97%   BMI 38.34 kg/m   Wt Readings from Last 3 Encounters:  02/24/17 230 lb 6.4 oz (104.5 kg)  11/26/16 230 lb (104.3 kg)  11/18/16 228 lb 1.6 oz (103.5 kg)    Physical Exam  Constitutional: She is oriented to person, place, and time. She appears well-developed and well-nourished.  HENT:  Head: Normocephalic and atraumatic.  Eyes: Conjunctivae and EOM are normal.  Neck: Normal range of motion.  Cardiovascular: Normal rate, regular rhythm and normal heart sounds.   Pulmonary/Chest: Effort normal and breath sounds normal.  Musculoskeletal: Normal range of motion.    Neurological: She is alert and oriented to person, place, and time.  Skin: No erythema.  Psychiatric: She has a normal mood and affect. Her behavior is normal. Judgment and thought content normal.    Results for orders placed or performed in visit on 12/23/16  Magnesium  Result Value Ref Range   Magnesium 1.7 1.7 - 2.4 mg/dL      Assessment & Plan:   Problem List Items Addressed This Visit      Cardiovascular and Mediastinum   Essential hypertension - Primary    The current medical regimen is effective;  continue present plan and medications.       Relevant Orders   Basic metabolic panel   Bayer DCA Hb A1c Waived   LP+ALT+AST [place], [place]     Endocrine   Type 2 diabetes mellitus ([place])    The current medical regimen is effective;  continue present plan and medications.       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  Hypercholesteremia    The current medical regimen is effective;  continue present plan and medications.       Relevant Orders   Basic metabolic panel   Bayer DCA Hb A1c Waived   LP+ALT+AST [place], Waived       Follow up plan: Return in about 6 months (around 08/27/2017) for Physical Exam, Hemoglobin A1c.

## 2017-02-25 ENCOUNTER — Encounter: Payer: Self-pay | Admitting: Family Medicine

## 2017-02-25 LAB — BASIC METABOLIC PANEL
BUN / CREAT RATIO: 23 (ref 12–28)
BUN: 20 mg/dL (ref 8–27)
CHLORIDE: 101 mmol/L (ref 96–106)
CO2: 24 mmol/L (ref 20–29)
CREATININE: 0.88 mg/dL (ref 0.57–1.00)
Calcium: 9.6 mg/dL (ref 8.7–10.3)
GFR calc Af Amer: 79 mL/min/{1.73_m2} (ref >59)
GFR calc non Af Amer: 68 mL/min/{1.73_m2} (ref >59)
GLUCOSE: 105 mg/dL — AB (ref 65–99)
POTASSIUM: 4.9 mmol/L (ref 3.5–5.2)
SODIUM: 142 mmol/L (ref 134–144)

## 2017-03-24 ENCOUNTER — Inpatient Hospital Stay: Payer: Medicare Other | Attending: Oncology

## 2017-03-24 ENCOUNTER — Inpatient Hospital Stay: Payer: Medicare Other

## 2017-03-24 LAB — MAGNESIUM: MAGNESIUM: 1.6 mg/dL — AB (ref 1.7–2.4)

## 2017-04-05 ENCOUNTER — Other Ambulatory Visit: Payer: Self-pay | Admitting: Family Medicine

## 2017-05-06 ENCOUNTER — Ambulatory Visit (INDEPENDENT_AMBULATORY_CARE_PROVIDER_SITE_OTHER): Payer: Medicare Other | Admitting: *Deleted

## 2017-05-06 DIAGNOSIS — Z950 Presence of cardiac pacemaker: Secondary | ICD-10-CM | POA: Diagnosis not present

## 2017-05-06 DIAGNOSIS — I440 Atrioventricular block, first degree: Secondary | ICD-10-CM | POA: Diagnosis not present

## 2017-05-06 LAB — CUP PACEART INCLINIC DEVICE CHECK
Brady Statistic RA Percent Paced: 0 %
Brady Statistic RV Percent Paced: 37 %
Date Time Interrogation Session: 20180911040000
Implantable Lead Implant Date: 20040917
Implantable Lead Location: 753860
Implantable Lead Model: 4479
Lead Channel Impedance Value: 440 Ohm
Lead Channel Impedance Value: 460 Ohm
Lead Channel Pacing Threshold Amplitude: 0.5 V
Lead Channel Pacing Threshold Amplitude: 0.9 V
Lead Channel Pacing Threshold Pulse Width: 0.4 ms
Lead Channel Setting Pacing Amplitude: 2.4 V
MDC IDC LEAD IMPLANT DT: 20040917
MDC IDC LEAD LOCATION: 753859
MDC IDC LEAD SERIAL: 312732
MDC IDC MSMT LEADCHNL RA SENSING INTR AMPL: 2.5 mV
MDC IDC MSMT LEADCHNL RV PACING THRESHOLD PULSEWIDTH: 0.4 ms
MDC IDC MSMT LEADCHNL RV SENSING INTR AMPL: 11.4 mV
MDC IDC PG IMPLANT DT: 20111209
MDC IDC SET LEADCHNL RA PACING AMPLITUDE: 2 V
MDC IDC SET LEADCHNL RV PACING PULSEWIDTH: 0.4 ms
MDC IDC SET LEADCHNL RV SENSING SENSITIVITY: 2.5 mV
Pulse Gen Serial Number: 595359

## 2017-05-06 NOTE — Progress Notes (Signed)
Pacemaker check in clinic. Normal device function. Thresholds, sensing, impedances consistent with previous measurements. Device programmed to maximize longevity. 27 mode switches (0%)--AT/AF per EGMs, total duration 7.80min, no OAC. No high ventricular rates noted. Device programmed at appropriate safety margins. Histogram distribution appropriate for patient activity level. Device programmed to optimize intrinsic conduction. Estimated longevity 5.0 years. Patient education completed. ROV with SK/B in 11/2017.

## 2017-05-06 NOTE — Patient Instructions (Signed)
Your physician wants you to follow-up in: April 2019 with Dr. Caryl Comes. You will receive a reminder letter in the mail two months in advance. If you don't receive a letter, please call our office to schedule the follow-up appointment.

## 2017-06-20 ENCOUNTER — Other Ambulatory Visit: Payer: Self-pay | Admitting: *Deleted

## 2017-06-20 NOTE — Progress Notes (Signed)
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  INTERVAL HISTORY: Patient returns to clinic today for laboratory work and routine yearly evaluation.  She continues to have occasional cramping day-to-day activity.  She does not complain of neuropathy today.  She currently feels well and is asymptomatic.  She has no neurologic complaints.  She denies any recent fevers or illnesses.  She denies any chest pain, shortness of breath, or hemoptysis.  She has no nausea, vomiting, constipation, or diarrhea.  She has no urinary complaints.  Patient offers no specific complaints today.   REVIEW OF SYSTEMS:   Review of Systems  Constitutional: Negative.  Negative for fever, malaise/fatigue and weight loss.  Respiratory: Negative.  Negative for cough and shortness of breath.   Cardiovascular: Negative.  Negative for chest pain and leg swelling.  Gastrointestinal: Negative.  Negative for abdominal pain.  Genitourinary: Negative.   Musculoskeletal: Negative.        Occasional muscle cramping  Skin: Negative.  Negative for rash.  Neurological: Negative.  Negative for sensory change and weakness.  Psychiatric/Behavioral: Negative.  The patient is not nervous/anxious.    As per HPI. Otherwise, a complete review of systems is negative.   PAST MEDICAL HISTORY: Past Medical History:  Diagnosis Date  . Breast CA ([place])    Chemo/XRT/lumpectomy  . Breast cancer ([place]) 09/2007   left breast, radiation, chemo  . Cardiomyopathy  secondary   . Hypersomnolence 09/21/2013  . Leg edema, left 09/21/2013  . 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 Pacemaker   . Personal history of chemotherapy    2009  . Personal history of radiation therapy 2009  . Sinus tachycardia     PAST SURGICAL HISTORY: Past Surgical History:  Procedure Laterality Date  . BREAST EXCISIONAL BIOPSY Left 2009   positive  . BREAST LUMPECTOMY     left breast  . CESAREAN SECTION  1983  . [place]  . INSERT / REPLACE / REMOVE PACEMAKER  2011   revision @ [facility]  05/13/2003   [facility]man Comp - DR    FAMILY HISTORY Family History  Problem Relation Age of Onset  . Diabetes Mother   . Heart disease Mother   . Thyroid disease Mother   . Hypertension Father   . Cancer Father   . Alcohol abuse Father   . Diabetes Father   . Stroke Other        Family hx of CVA or stroke  . Diabetes Brother   . Diabetes Daughter   . Diabetes Paternal Uncle   . Diabetes Paternal Grandmother   . Breast cancer Maternal Aunt        ADVANCED DIRECTIVES:    HEALTH MAINTENANCE: Social History  Substance Use Topics  . Smoking status: Former Smoker    Quit date: 08/26/1981  . Smokeless tobacco: Never Used  . Alcohol use Yes     Comment: rare     Colonoscopy:  PAP:  Bone density:  Lipid panel:  Allergies  Allergen Reactions  . Penicillins Swelling  Causes organs to swell  . Levaquin [Levofloxacin] Swelling    Leg swelling and muscle weakness    Current Outpatient Prescriptions  Medication Sig Dispense Refill  . ACCU-CHEK AVIVA PLUS test strip TEST BLOOD SUGAR ONCE DAILY 100 each 12  . aMILoride (MIDAMOR) 5 MG tablet Take 1 tablet (5 mg total) by mouth daily. 30 tablet 12  . atenolol (TENORMIN) 50 MG tablet Take 1 tablet (50 mg total) by mouth daily. 30 tablet 12  . cetirizine (ZYRTEC) 5 MG tablet Take 5 mg by mouth as needed for allergies. Seasonal allergies    . chlorpheniramine  (CHLOR-TRIMETON) 4 MG tablet Take 4 mg by mouth every 6 (six) hours as needed for allergies.    Marland Kitchen CINNAMON PO Take by mouth daily.    . fluticasone (FLONASE) 50 MCG/ACT nasal spray Place 2 sprays into both nostrils daily. 16 g 12  . magnesium oxide (MAG-OX) 400 MG tablet Take 400 mg by mouth 2 (two) times daily.    . metFORMIN (GLUCOPHAGE) 500 MG tablet Take 1 tablet (500 mg total) by mouth at bedtime. 30 tablet 12  . naproxen sodium (ANAPROX) 220 MG tablet Take 220 mg by mouth 2 (two) times daily with a meal.    . pregabalin (LYRICA) 50 MG capsule Take 1 capsule (50 mg total) by mouth 3 (three) times daily. 90 capsule 5  . ranitidine (ZANTAC) 150 MG capsule Take 150 mg by mouth 2 (two) times daily.    . SUMAtriptan Succinate (IMITREX PO) Take by mouth as needed.       No current facility-administered medications for this visit.     OBJECTIVE: Vitals:   06/23/17 1408  BP: 124/72  Pulse: 87  Resp: 18  Temp: 98.3 F (36.8 C)     Body mass index is 37.44 kg/m.    ECOG FS:0 - Asymptomatic  General: Well-developed, well-nourished, no acute distress. Eyes: Pink conjunctiva, anicteric sclera. Breasts: Bilateral breast and axilla without lumps or masses.  Patient requested exam be deferred today. Lungs: Clear to auscultation bilaterally. Heart: Regular rate and rhythm. No rubs, murmurs, or gallops. Abdomen: Soft, nontender, nondistended. No organomegaly noted, normoactive bowel sounds. Musculoskeletal: No edema, cyanosis, or clubbing. Neuro: Alert, answering all questions appropriately. Cranial nerves grossly intact. Skin: No rashes or petechiae noted. Psych: Normal affect.   LAB RESULTS:  Lab Results  Component Value Date   NA 142 02/24/2017   K 4.9 02/24/2017   CL 101 02/24/2017   CO2 24 02/24/2017   GLUCOSE 105 (H) 02/24/2017   BUN 20 02/24/2017   CREATININE 0.88 02/24/2017   CALCIUM 9.6 02/24/2017   PROT 6.6 07/29/2016   ALBUMIN 4.3 07/29/2016   AST 22 02/24/2017   ALT  24 02/24/2017   ALKPHOS 92 07/29/2016   BILITOT 0.3 07/29/2016   GFRNONAA 68 02/24/2017   GFRAA 79 02/24/2017    Lab Results  Component Value Date   WBC 6.1 07/29/2016   NEUTROABS 3.3 07/29/2016   HGB 13.7 07/29/2016   HCT 41.3 07/29/2016   MCV 91 07/29/2016   PLT 213 07/29/2016     STUDIES: No results [name].  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  Patient completed year long maintenance Herceptin on Jan 02, 2009.  Patient's most recent mammogram on December 16, 2016 was reported  as BI-RADS 1.  Repeat in April 2019.  She did not require an aromatase inhibitor given ER/PR status of her tumor.  Return to clinic in one year for repeat laboratory work and further evaluation.     2.  Hypomagnesemia/cramping: Patient's magnesium is 1.5. Proceed with 2 g IV magnesium today.  Continue amiloride 5 mg daily and 3 tabs Slow-Mag twice a day.  Return in 6 months and then 1 year for laboratory work and consideration of IV magnesium infusion.  Patient will receive magnesium infusion if her levels are <1.6 or if she is symptomatic. 3.  Pacemaker: Continue monitoring and evaluation per cardiology.   Patient expressed understanding and was in agreement with this plan. She also understands that She can call clinic at any time with any questions, concerns, or complaints.    Lloyd Huger, MD   06/23/2017 2:18 PM

## 2017-06-23 ENCOUNTER — Inpatient Hospital Stay: Payer: Medicare Other | Attending: Oncology

## 2017-06-23 ENCOUNTER — Inpatient Hospital Stay (HOSPITAL_BASED_OUTPATIENT_CLINIC_OR_DEPARTMENT_OTHER): Payer: Medicare Other | Admitting: Oncology

## 2017-06-23 ENCOUNTER — Inpatient Hospital Stay: Payer: Medicare Other

## 2017-06-23 VITALS — BP 124/72 | HR 87 | Temp 98.3°F | Resp 18 | Wt 225.0 lb

## 2017-06-23 DIAGNOSIS — Z950 Presence of cardiac pacemaker: Secondary | ICD-10-CM

## 2017-06-23 DIAGNOSIS — Z9221 Personal history of antineoplastic chemotherapy: Secondary | ICD-10-CM | POA: Insufficient documentation

## 2017-06-23 DIAGNOSIS — Z23 Encounter for immunization: Secondary | ICD-10-CM | POA: Insufficient documentation

## 2017-06-23 DIAGNOSIS — Z853 Personal history of malignant neoplasm of breast: Secondary | ICD-10-CM | POA: Diagnosis present

## 2017-06-23 DIAGNOSIS — Z87891 Personal history of nicotine dependence: Secondary | ICD-10-CM

## 2017-06-23 DIAGNOSIS — Z79899 Other long term (current) drug therapy: Secondary | ICD-10-CM | POA: Diagnosis not present

## 2017-06-23 DIAGNOSIS — Z7984 Long term (current) use of oral hypoglycemic drugs: Secondary | ICD-10-CM | POA: Insufficient documentation

## 2017-06-23 DIAGNOSIS — I429 Cardiomyopathy, unspecified: Secondary | ICD-10-CM | POA: Diagnosis not present

## 2017-06-23 DIAGNOSIS — R290 Tetany: Secondary | ICD-10-CM | POA: Diagnosis not present

## 2017-06-23 DIAGNOSIS — Z923 Personal history of irradiation: Secondary | ICD-10-CM | POA: Insufficient documentation

## 2017-06-23 DIAGNOSIS — C50912 Malignant neoplasm of unspecified site of left female breast: Secondary | ICD-10-CM

## 2017-06-23 LAB — MAGNESIUM: Magnesium: 1.5 mg/dL — ABNORMAL LOW (ref 1.7–2.4)

## 2017-06-23 MED ORDER — SODIUM CHLORIDE 0.9 % IV SOLN
INTRAVENOUS | Status: DC
  Administered 2017-06-23: 15:00:00 via INTRAVENOUS
  Filled 2017-06-23: qty 1000

## 2017-06-23 MED ORDER — SODIUM CHLORIDE 0.9 % IV SOLN: 2.0000 g | Freq: Once | INTRAVENOUS | Status: DC

## 2017-06-23 MED ORDER — MAGNESIUM SULFATE 2 GM/50ML IV SOLN: 2.0000 g | Freq: Once | INTRAVENOUS | Status: DC

## 2017-06-23 MED ORDER — INFLUENZA VAC SPLIT QUAD 0.5 ML IM SUSY
0.5000 mL | PREFILLED_SYRINGE | Freq: Once | INTRAMUSCULAR | Status: AC
  Administered 2017-06-23: 0.5 mL via INTRAMUSCULAR
  Filled 2017-06-23: qty 0.5

## 2017-06-23 MED ORDER — MAGNESIUM SULFATE 2 GM/50ML IV SOLN
2.0000 g | Freq: Once | INTRAVENOUS | Status: AC
  Administered 2017-06-23: 2 g via INTRAVENOUS
  Filled 2017-06-23: qty 50

## 2017-08-05 ENCOUNTER — Other Ambulatory Visit: Payer: Self-pay | Admitting: Family Medicine

## 2017-08-05 DIAGNOSIS — E119 Type 2 diabetes mellitus without complications: Secondary | ICD-10-CM

## 2017-08-05 DIAGNOSIS — I10 Essential (primary) hypertension: Secondary | ICD-10-CM

## 2017-08-05 MED ORDER — METFORMIN HCL 500 MG PO TABS: 500.0000 mg | ORAL_TABLET | Freq: Every day | ORAL | 0 refills | Status: DC

## 2017-08-05 NOTE — Telephone Encounter (Signed)
Received a refill request from [facility] on patients Metformin HCL 500 mg #90   [facility] fax [phone]  Thanks

## 2017-08-13 ENCOUNTER — Encounter: Payer: Medicare Other | Admitting: Family Medicine

## 2017-09-01 ENCOUNTER — Other Ambulatory Visit: Payer: Self-pay | Admitting: Family Medicine

## 2017-09-01 DIAGNOSIS — I10 Essential (primary) hypertension: Secondary | ICD-10-CM

## 2017-09-08 ENCOUNTER — Other Ambulatory Visit: Payer: Self-pay | Admitting: Family Medicine

## 2017-09-08 DIAGNOSIS — E119 Type 2 diabetes mellitus without complications: Secondary | ICD-10-CM

## 2017-09-09 ENCOUNTER — Encounter: Payer: Self-pay | Admitting: Family Medicine

## 2017-09-09 ENCOUNTER — Ambulatory Visit (INDEPENDENT_AMBULATORY_CARE_PROVIDER_SITE_OTHER): Payer: Medicare Other | Admitting: Family Medicine

## 2017-09-09 VITALS — HR 69 | Ht 64.96 in | Wt 233.0 lb

## 2017-09-09 DIAGNOSIS — I10 Essential (primary) hypertension: Secondary | ICD-10-CM

## 2017-09-09 DIAGNOSIS — Z0000 Encounter for general adult medical examination without abnormal findings: Secondary | ICD-10-CM

## 2017-09-09 DIAGNOSIS — Z0001 Encounter for general adult medical examination with abnormal findings: Secondary | ICD-10-CM | POA: Diagnosis not present

## 2017-09-09 DIAGNOSIS — G620 Drug-induced polyneuropathy: Secondary | ICD-10-CM

## 2017-09-09 DIAGNOSIS — Z1329 Encounter for screening for other suspected endocrine disorder: Secondary | ICD-10-CM | POA: Diagnosis not present

## 2017-09-09 DIAGNOSIS — E7800 Pure hypercholesterolemia, unspecified: Secondary | ICD-10-CM

## 2017-09-09 DIAGNOSIS — T451X5A Adverse effect of antineoplastic and immunosuppressive drugs, initial encounter: Secondary | ICD-10-CM | POA: Diagnosis not present

## 2017-09-09 DIAGNOSIS — Z7189 Other specified counseling: Secondary | ICD-10-CM

## 2017-09-09 DIAGNOSIS — E119 Type 2 diabetes mellitus without complications: Secondary | ICD-10-CM

## 2017-09-09 MED ORDER — AMILORIDE HCL 5 MG PO TABS: 5.0000 mg | ORAL_TABLET | Freq: Every day | ORAL | 12 refills | Status: DC

## 2017-09-09 MED ORDER — ATENOLOL 50 MG PO TABS: 50.0000 mg | ORAL_TABLET | Freq: Every day | ORAL | 12 refills | Status: DC

## 2017-09-09 MED ORDER — FLUTICASONE PROPIONATE 50 MCG/ACT NA SUSP: 2.0000 | Freq: Every day | NASAL | 12 refills | Status: DC

## 2017-09-09 MED ORDER — METFORMIN HCL 500 MG PO TABS: 500.0000 mg | ORAL_TABLET | Freq: Every day | ORAL | 12 refills | Status: DC

## 2017-09-09 MED ORDER — PREGABALIN 50 MG PO CAPS: 50.0000 mg | ORAL_CAPSULE | Freq: Three times a day (TID) | ORAL | 5 refills | Status: DC

## 2017-09-09 NOTE — Assessment & Plan Note (Signed)
A voluntary discussion about advance care planning including the explanation and discussion of advance directives was extensively discussed  with the patient.  Explanation about the health care proxy and Living will was reviewed and packet with forms with explanation of how to fill them out was given.    

## 2017-09-09 NOTE — Progress Notes (Signed)
Pulse 69   Ht 5' 4.96" (1.65 m)   Wt 233 lb (105.7 kg)   SpO2 99%   BMI 38.82 kg/m    Subjective:    Patient ID: [name], female    DOB: [birth year], 67 y.o.   MRN: [id number]  HPI: [name] is a 67 y.o. female  Annual exam  Patient all in all doing well for physical.  Good reports from breast cancer follow-up and from oncology.  Patient recovered well from radiation and chemo except for residual chemotherapy-induced neuropathy.  Patient still notices some memory deficit also which can be overcome with effort. Good reports from cardiology regarding pacemaker is got 5 more years on her pacemaker sees cardiologist and with good reports. No other issues with medications are conditions.  Relevant past medical, surgical, family and social history reviewed and updated as indicated. Interim medical history since our last visit reviewed. Allergies and medications reviewed and updated.  Review of Systems  Constitutional: Negative.   HENT: Negative.   Eyes: Negative.   Respiratory: Negative.   Cardiovascular: Negative.   Gastrointestinal: Negative.   Endocrine: Negative.   Genitourinary: Negative.   Musculoskeletal: Negative.   Skin: Negative.   Allergic/Immunologic: Negative.   Neurological: Negative.   Hematological: Negative.   Psychiatric/Behavioral: Negative.     Per HPI unless specifically indicated above     Objective:    Pulse 69   Ht 5' 4.96" (1.65 m)   Wt 233 lb (105.7 kg)   SpO2 99%   BMI 38.82 kg/m   Wt Readings from Last 3 Encounters:  09/09/17 233 lb (105.7 kg)  06/23/17 225 lb (102.1 kg)  02/24/17 230 lb 6.4 oz (104.5 kg)    Physical Exam  Constitutional: She is oriented to person, place, and time. She appears well-developed and well-nourished.  HENT:  Head: Normocephalic and atraumatic.  Right Ear: External ear normal.  Left Ear: External ear normal.  Nose: Nose normal.  Mouth/Throat: Oropharynx is clear and moist.  Eyes: Conjunctivae and  EOM are normal. Pupils are equal, round, and reactive to light.  Neck: Normal range of motion. Neck supple. Carotid bruit is not present.  Cardiovascular: Normal rate, regular rhythm and normal heart sounds.  No murmur heard. Pulmonary/Chest: Effort normal and breath sounds normal.  Done at oncology  Abdominal: Soft. Bowel sounds are normal. There is no hepatosplenomegaly.  Musculoskeletal: Normal range of motion.  Neurological: She is alert and oriented to person, place, and time.  Skin: No rash noted.  Psychiatric: She has a normal mood and affect. Her behavior is normal. Judgment and thought content normal.    Results for orders placed or performed in visit on 06/23/17  Magnesium  Result Value Ref Range   Magnesium 1.5 (L) 1.7 - 2.4 mg/dL      Assessment & Plan:   Problem List Items Addressed This Visit      Cardiovascular and Mediastinum   Essential hypertension - Primary   Relevant Medications   aMILoride (MIDAMOR) 5 MG tablet   atenolol (TENORMIN) 50 MG tablet   Other Relevant Orders   Bayer DCA Hb A1c Waived   CBC with Differential/Platelet   Comprehensive metabolic panel   Lipid panel   Urinalysis, Routine w reflex microscopic     Endocrine   Type 2 diabetes mellitus (HCC)   Relevant Medications   metFORMIN (GLUCOPHAGE) 500 MG tablet   Other Relevant Orders   Bayer DCA Hb A1c Waived   CBC with Differential/Platelet  Comprehensive metabolic panel   Lipid panel   Urinalysis, Routine w reflex microscopic     Nervous and Auditory   Chemotherapy-induced neuropathy (HCC)   Relevant Medications   pregabalin (LYRICA) 50 MG capsule     Other   Hypomagnesemia   Hypercholesteremia   Relevant Medications   aMILoride (MIDAMOR) 5 MG tablet   atenolol (TENORMIN) 50 MG tablet   Other Relevant Orders   Bayer DCA Hb A1c Waived   CBC with Differential/Platelet   Comprehensive metabolic panel   Lipid panel   Urinalysis, Routine w reflex microscopic   Advanced care  planning/counseling discussion    A voluntary discussion about advance care planning including the explanation and discussion of advance directives was extensively discussed  with the patient.  Explanation about the health care proxy and Living will was reviewed and packet with forms with explanation of how to fill them out was given.         Other Visit Diagnoses    Thyroid disorder screen       Relevant Orders   TSH   PE (physical exam), annual           Follow up plan: Return in about 6 months (around 03/09/2018) for Hemoglobin A1c, BMP,  Lipids, ALT, AST.

## 2017-09-10 LAB — TSH: TSH: 3.04 u[IU]/mL (ref 0.450–4.500)

## 2017-09-10 LAB — COMPREHENSIVE METABOLIC PANEL
A/G RATIO: 2.1 (ref 1.2–2.2)
ALT: 21 IU/L (ref 0–32)
AST: 21 IU/L (ref 0–40)
Albumin: 4.7 g/dL (ref 3.6–4.8)
Alkaline Phosphatase: 85 IU/L (ref 39–117)
BUN/Creatinine Ratio: 19 (ref 12–28)
BUN: 14 mg/dL (ref 8–27)
Bilirubin Total: 0.4 mg/dL (ref 0.0–1.2)
CO2: 24 mmol/L (ref 20–29)
Calcium: 9.7 mg/dL (ref 8.7–10.3)
Chloride: 103 mmol/L (ref 96–106)
Creatinine, Ser: 0.72 mg/dL (ref 0.57–1.00)
GFR, EST AFRICAN AMERICAN: 100 mL/min/{1.73_m2} (ref >59)
GFR, EST NON AFRICAN AMERICAN: 87 mL/min/{1.73_m2} (ref >59)
GLUCOSE: 74 mg/dL (ref 65–99)
Globulin, Total: 2.2 g/dL (ref 1.5–4.5)
Potassium: 4.5 mmol/L (ref 3.5–5.2)
Sodium: 142 mmol/L (ref 134–144)
TOTAL PROTEIN: 6.9 g/dL (ref 6.0–8.5)

## 2017-09-10 LAB — LIPID PANEL
CHOL/HDL RATIO: 4.9 ratio — AB (ref 0.0–4.4)
Cholesterol, Total: 233 mg/dL — ABNORMAL HIGH (ref 100–199)
HDL: 48 mg/dL (ref >39)
LDL Calculated: 153 mg/dL — ABNORMAL HIGH (ref 0–99)
Triglycerides: 161 mg/dL — ABNORMAL HIGH (ref 0–149)
VLDL CHOLESTEROL CAL: 32 mg/dL (ref 5–40)

## 2017-09-10 LAB — CBC WITH DIFFERENTIAL/PLATELET
BASOS: 1 %
Basophils Absolute: 0 10*3/uL (ref 0.0–0.2)
EOS (ABSOLUTE): 0.2 10*3/uL (ref 0.0–0.4)
Eos: 2 %
Hematocrit: 40.6 % (ref 34.0–46.6)
Hemoglobin: 14.1 g/dL (ref 11.1–15.9)
Immature Grans (Abs): 0 10*3/uL (ref 0.0–0.1)
Immature Granulocytes: 0 %
LYMPHS: 35 %
Lymphocytes Absolute: 2.6 10*3/uL (ref 0.7–3.1)
MCH: 30.4 pg (ref 26.6–33.0)
MCHC: 34.7 g/dL (ref 31.5–35.7)
MCV: 88 fL (ref 79–97)
Monocytes Absolute: 0.6 10*3/uL (ref 0.1–0.9)
Monocytes: 8 %
Neutrophils Absolute: 4 10*3/uL (ref 1.4–7.0)
Neutrophils: 54 %
PLATELETS: 231 10*3/uL (ref 150–379)
RBC: 4.64 x10E6/uL (ref 3.77–5.28)
RDW: 13.9 % (ref 12.3–15.4)
WBC: 7.4 10*3/uL (ref 3.4–10.8)

## 2017-09-10 LAB — URINALYSIS, ROUTINE W REFLEX MICROSCOPIC
Bilirubin, UA: NEGATIVE
GLUCOSE, UA: NEGATIVE
Ketones, UA: NEGATIVE
Nitrite, UA: NEGATIVE
PROTEIN UA: NEGATIVE
RBC, UA: NEGATIVE
Specific Gravity, UA: 1.025 (ref 1.005–1.030)
Urobilinogen, Ur: 0.2 mg/dL (ref 0.2–1.0)
pH, UA: 5.5 (ref 5.0–7.5)

## 2017-09-10 LAB — MICROSCOPIC EXAMINATION

## 2017-09-10 LAB — BAYER DCA HB A1C WAIVED: HB A1C (BAYER DCA - WAIVED): 5.6 % (ref <7.0)

## 2017-09-11 ENCOUNTER — Telehealth: Payer: Self-pay

## 2017-09-11 NOTE — Telephone Encounter (Signed)
Call pt 

## 2017-09-11 NOTE — Telephone Encounter (Signed)
Patient called with concerns about how to diet to deal to handle Cholesterol and DM. Patient states she has been reading online and they recommended an increase fruits and vegetable, but knows that will increase her sugar. She will cut out bacon, but states she does not eat a lot of greasy foods. Please advise.

## 2017-12-22 ENCOUNTER — Ambulatory Visit: Payer: Medicare Other

## 2017-12-22 ENCOUNTER — Other Ambulatory Visit: Payer: Medicare Other

## 2017-12-29 ENCOUNTER — Other Ambulatory Visit: Payer: Self-pay

## 2017-12-29 ENCOUNTER — Inpatient Hospital Stay: Payer: Medicare Other

## 2017-12-29 ENCOUNTER — Other Ambulatory Visit: Payer: Self-pay | Admitting: *Deleted

## 2017-12-29 ENCOUNTER — Inpatient Hospital Stay: Payer: Medicare Other | Attending: Oncology

## 2017-12-29 LAB — MAGNESIUM: MAGNESIUM: 1.5 mg/dL — AB (ref 1.7–2.4)

## 2017-12-29 MED ORDER — SODIUM CHLORIDE 0.9 % IV SOLN
Freq: Once | INTRAVENOUS | Status: AC
  Administered 2017-12-29: 14:00:00 via INTRAVENOUS
  Filled 2017-12-29: qty 1000

## 2017-12-29 MED ORDER — MAGNESIUM SULFATE 2 GM/50ML IV SOLN
2.0000 g | Freq: Once | INTRAVENOUS | Status: AC
  Administered 2017-12-29: 2 g via INTRAVENOUS
  Filled 2017-12-29: qty 50

## 2017-12-29 NOTE — Progress Notes (Signed)
mag

## 2018-01-06 ENCOUNTER — Ambulatory Visit
Admission: RE | Admit: 2018-01-06 | Discharge: 2018-01-06 | Disposition: A | Discharge status: Home / Self Care | Payer: Medicare Other | Source: Ambulatory Visit | Attending: Oncology | Admitting: Oncology

## 2018-01-06 DIAGNOSIS — C50912 Malignant neoplasm of unspecified site of left female breast: Secondary | ICD-10-CM

## 2018-01-06 DIAGNOSIS — Z1231 Encounter for screening mammogram for malignant neoplasm of breast: Secondary | ICD-10-CM | POA: Diagnosis present

## 2018-03-09 ENCOUNTER — Ambulatory Visit: Payer: Medicare Other | Admitting: Family Medicine

## 2018-03-16 ENCOUNTER — Ambulatory Visit: Payer: Medicare Other | Admitting: Family Medicine

## 2018-03-16 ENCOUNTER — Encounter: Payer: Self-pay | Admitting: Family Medicine

## 2018-03-16 ENCOUNTER — Ambulatory Visit (INDEPENDENT_AMBULATORY_CARE_PROVIDER_SITE_OTHER): Payer: Medicare Other

## 2018-03-16 VITALS — BP 118/74 | HR 70 | Ht 64.5 in | Wt 226.0 lb

## 2018-03-16 VITALS — BP 118/74 | HR 70 | Temp 98.0°F | Resp 16 | Ht 64.5 in | Wt 226.0 lb

## 2018-03-16 DIAGNOSIS — E119 Type 2 diabetes mellitus without complications: Secondary | ICD-10-CM | POA: Diagnosis not present

## 2018-03-16 DIAGNOSIS — I10 Essential (primary) hypertension: Secondary | ICD-10-CM

## 2018-03-16 DIAGNOSIS — Z23 Encounter for immunization: Secondary | ICD-10-CM | POA: Diagnosis not present

## 2018-03-16 DIAGNOSIS — T451X5A Adverse effect of antineoplastic and immunosuppressive drugs, initial encounter: Secondary | ICD-10-CM

## 2018-03-16 DIAGNOSIS — E7800 Pure hypercholesterolemia, unspecified: Secondary | ICD-10-CM | POA: Diagnosis not present

## 2018-03-16 DIAGNOSIS — Z0000 Encounter for general adult medical examination without abnormal findings: Secondary | ICD-10-CM

## 2018-03-16 DIAGNOSIS — G620 Drug-induced polyneuropathy: Secondary | ICD-10-CM | POA: Diagnosis not present

## 2018-03-16 LAB — BAYER DCA HB A1C WAIVED: HB A1C: 5.9 % (ref <7.0)

## 2018-03-16 LAB — LP+ALT+AST PICCOLO, WAIVED
ALT (SGPT) Piccolo, Waived: 29 U/L (ref 10–47)
AST (SGOT) Piccolo, Waived: 21 U/L (ref 11–38)
Chol/HDL Ratio Piccolo,Waive: 4.3 mg/dL
Cholesterol Piccolo, Waived: 231 mg/dL — ABNORMAL HIGH (ref <200)
HDL Chol Piccolo, Waived: 54 mg/dL — ABNORMAL LOW (ref >59)
LDL CHOL CALC PICCOLO WAIVED: 136 mg/dL — AB (ref <100)
Triglycerides Piccolo,Waived: 204 mg/dL — ABNORMAL HIGH (ref <150)
VLDL CHOL CALC PICCOLO,WAIVE: 41 mg/dL — AB (ref <30)

## 2018-03-16 LAB — MICROALBUMIN, URINE WAIVED
Creatinine, Urine Waived: 100 mg/dL (ref 10–300)
Microalb, Ur Waived: 30 mg/L — ABNORMAL HIGH (ref 0–19)

## 2018-03-16 MED ORDER — SUMATRIPTAN SUCCINATE 100 MG PO TABS: 100.0000 mg | ORAL_TABLET | ORAL | 12 refills | Status: AC | PRN

## 2018-03-16 MED ORDER — ATORVASTATIN CALCIUM 40 MG PO TABS: 40.0000 mg | ORAL_TABLET | Freq: Every day | ORAL | 3 refills | Status: DC

## 2018-03-16 MED ORDER — METFORMIN HCL 500 MG PO TABS: 500.0000 mg | ORAL_TABLET | Freq: Every day | ORAL | 2 refills | Status: DC

## 2018-03-16 MED ORDER — PREGABALIN 50 MG PO CAPS: 50.0000 mg | ORAL_CAPSULE | Freq: Three times a day (TID) | ORAL | 1 refills | Status: DC

## 2018-03-16 NOTE — Assessment & Plan Note (Signed)
Discussed hypercholesterol meds chronically will go ahead and not make any more excuses and start atorvastatin 40 mg 1 a day

## 2018-03-16 NOTE — Assessment & Plan Note (Signed)
The current medical regimen is effective;  continue present plan and medications.  

## 2018-03-16 NOTE — Patient Instructions (Addendum)
Ms. [name] , Thank you for taking time to come for your Medicare Wellness Visit. I appreciate your ongoing commitment to your health goals. Please review the following plan we discussed and let me know if I can assist you in the future.   Screening recommendations/referrals: Colonoscopy: cologuard completed 08/31/2016, due 09/01/2019 Mammogram: completed 01/06/2018  Bone Density: completed 08/16/2014 Recommended yearly ophthalmology/optometry visit for glaucoma screening and checkup Recommended yearly dental visit for hygiene and checkup  Vaccinations: Influenza vaccine: due 04/2018 Pneumococcal vaccine: pneumovax 23 done today, completed series  Tdap vaccine: up to date  Shingles vaccine: shingrix eligible, check with your insurance company for coverage   Advanced directives: Advance directive discussed with you today. I have provided a copy for you to complete at home and have notarized. Once this is complete please bring a copy in to our office so we can scan it into your chart.  Conditions/risks identified: Recommend continue drinking at least 6-8 glasses of water a day   Next appointment: Follow up in one year for your annual wellness exam.    Preventive Care 65 Years and Older, Female Preventive care refers to lifestyle choices and visits with your health care provider that can promote health and wellness. What does preventive care include?  A yearly physical exam. This is also called an annual well check.  Dental exams once or twice a year.  Routine eye exams. Ask your health care provider how often you should have your eyes checked.  Personal lifestyle choices, including:  Daily care of your teeth and gums.  Regular physical activity.  Eating a healthy diet.  Avoiding tobacco and drug use.  Limiting alcohol use.  Practicing safe sex.  Taking low-dose aspirin every day.  Taking vitamin and mineral supplements as recommended by your health care provider. What happens  during an annual well check? The services and screenings done by your health care provider during your annual well check will depend on your age, overall health, lifestyle risk factors, and family history of disease. Counseling  Your health care provider may ask you questions about your:  Alcohol use.  Tobacco use.  Drug use.  Emotional well-being.  Home and relationship well-being.  Sexual activity.  Eating habits.  History of falls.  Memory and ability to understand (cognition).  Work and work Statistician.  Reproductive health. Screening  You may have the following tests or measurements:  Height, weight, and BMI.  Blood pressure.  Lipid and cholesterol levels. These may be checked every 5 years, or more frequently if you are over 29 years old.  Skin check.  Lung cancer screening. You may have this screening every year starting at age 51 if you have a 30-pack-year history of smoking and currently smoke or have quit within the past 15 years.  Fecal occult blood test (FOBT) of the stool. You may have this test every year starting at age 53.  Flexible sigmoidoscopy or colonoscopy. You may have a sigmoidoscopy every 5 years or a colonoscopy every 10 years starting at age 22.  Hepatitis C blood test.  Hepatitis B blood test.  Sexually transmitted disease (STD) testing.  Diabetes screening. This is done by checking your blood sugar (glucose) after you have not eaten for a while (fasting). You may have this done every 1-3 years.  Bone density scan. This is done to screen for osteoporosis. You may have this done starting at age 36.  Mammogram. This may be done every 1-2 years. Talk to your health care  provider about how often you should have regular mammograms. Talk with your health care provider about your test results, treatment options, and if necessary, the need for more tests. Vaccines  Your health care provider may recommend certain vaccines, such  as:  Influenza vaccine. This is recommended every year.  Tetanus, diphtheria, and acellular pertussis (Tdap, Td) vaccine. You may need a Td booster every 10 years.  Zoster vaccine. You may need this after age 45.  Pneumococcal 13-valent conjugate (PCV13) vaccine. One dose is recommended after age 64.  Pneumococcal polysaccharide (PPSV23) vaccine. One dose is recommended after age 24. Talk to your health care provider about which screenings and vaccines you need and how often you need them. This information is not intended to replace advice given to you by your health care provider. Make sure you discuss any questions you have with your health care provider. Document Released: 09/08/2015 Document Revised: 05/01/2016 Document Reviewed: 06/13/2015 Elsevier Interactive Patient Education  2017 [place] Prevention in the Home Falls can cause injuries. They can happen to people of all ages. There are many things you can do to make your home safe and to help prevent falls. What can I do on the outside of my home?  Regularly fix the edges of walkways and driveways and fix any cracks.  Remove anything that might make you trip as you walk through a door, such as a raised step or threshold.  Trim any bushes or trees on the path to your home.  Use bright outdoor lighting.  Clear any walking paths of anything that might make someone trip, such as rocks or tools.  Regularly check to see if handrails are loose or broken. Make sure that both sides of any steps have handrails.  Any raised decks and porches should have guardrails on the edges.  Have any leaves, snow, or ice cleared regularly.  Use sand or salt on walking paths during winter.  Clean up any spills in your garage right away. This includes oil or grease spills. What can I do in the bathroom?  Use night lights.  Install grab bars by the toilet and in the tub and shower. Do not use towel bars as grab bars.  Use  non-skid mats or decals in the tub or shower.  If you need to sit down in the shower, use a plastic, non-slip stool.  Keep the floor dry. Clean up any water that spills on the floor as soon as it happens.  Remove soap buildup in the tub or shower regularly.  Attach bath mats securely with double-sided non-slip rug tape.  Do not have throw rugs and other things on the floor that can make you trip. What can I do in the bedroom?  Use night lights.  Make sure that you have a light by your bed that is easy to reach.  Do not use any sheets or blankets that are too big for your bed. They should not hang down onto the floor.  Have a firm chair that has side arms. You can use this for support while you get dressed.  Do not have throw rugs and other things on the floor that can make you trip. What can I do in the kitchen?  Clean up any spills right away.  Avoid walking on wet floors.  Keep items that you use a lot in easy-to-reach places.  If you need to reach something above you, use a strong step stool that has a grab bar.  Keep electrical cords out of the way.  Do not use floor polish or wax that makes floors slippery. If you must use wax, use non-skid floor wax.  Do not have throw rugs and other things on the floor that can make you trip. What can I do with my stairs?  Do not leave any items on the stairs.  Make sure that there are handrails on both sides of the stairs and use them. Fix handrails that are broken or loose. Make sure that handrails are as long as the stairways.  Check any carpeting to make sure that it is firmly attached to the stairs. Fix any carpet that is loose or worn.  Avoid having throw rugs at the top or bottom of the stairs. If you do have throw rugs, attach them to the floor with carpet tape.  Make sure that you have a light switch at the top of the stairs and the bottom of the stairs. If you do not have them, ask someone to add them for you. What  else can I do to help prevent falls?  Wear shoes that:  Do not have high heels.  Have rubber bottoms.  Are comfortable and fit you well.  Are closed at the toe. Do not wear sandals.  If you use a stepladder:  Make sure that it is fully opened. Do not climb a closed stepladder.  Make sure that both sides of the stepladder are locked into place.  Ask someone to hold it for you, if possible.  Clearly mark and make sure that you can see:  Any grab bars or handrails.  First and last steps.  Where the edge of each step is.  Use tools that help you move around (mobility aids) if they are needed. These include:  Canes.  Walkers.  Scooters.  Crutches.  Turn on the lights when you go into a dark area. Replace any light bulbs as soon as they burn out.  Set up your furniture so you have a clear path. Avoid moving your furniture around.  If any of your floors are uneven, fix them.  If there are any pets around you, be aware of where they are.  Review your medicines with your doctor. Some medicines can make you feel dizzy. This can increase your chance of falling. Ask your doctor what other things that you can do to help prevent falls. This information is not intended to replace advice given to you by your health care provider. Make sure you discuss any questions you have with your health care provider. Document Released: 06/08/2009 Document Revised: 01/18/2016 Document Reviewed: 09/16/2014 Elsevier Interactive Patient Education  2017 [employer].

## 2018-03-16 NOTE — Progress Notes (Signed)
Subjective:   [name] is a 67 y.o. female who presents for Medicare Annual (Subsequent) preventive examination.  Review of Systems:   Cardiac Risk Factors include: advanced age (>63mn, >>33women);diabetes mellitus;dyslipidemia;hypertension;obesity (BMI >30kg/m2)     Objective:     Vitals: BP 118/74 (BP Location: Left Arm, Patient Position: Sitting)   Pulse 70   Temp 98 F (36.7 C) (Temporal)   Resp 16   Ht 5' 4.5" (1.638 m)   Wt 226 lb (102.5 kg)   SpO2 98%   BMI 38.19 kg/m   Body mass index is 38.19 kg/m.  Advanced Directives 03/16/2018 06/23/2017 06/24/2016  Does Patient Have a Medical Advance Directive? No No No  Would patient like information on creating a medical advance directive? Yes (MAU/Ambulatory/Procedural Areas - Information given) - No - patient declined information    Tobacco Social History   Tobacco Use  Smoking Status Former Smoker  . Last attempt to quit: 08/26/1981  . Years since quitting: 36.5  Smokeless Tobacco Never Used     Counseling given: Not Answered   Clinical Intake:  Pre-visit preparation completed: Yes  Pain : 0-10 Pain Score: 2  Pain Type: Chronic pain Pain Location: Generalized Pain Descriptors / Indicators: Aching Pain Onset: More than a month ago Pain Frequency: Constant     Nutritional Status: BMI > 30  Obese Nutritional Risks: None Diabetes: Yes CBG done?: No Did pt. bring in CBG monitor from home?: No  How often do you need to have someone help you when you read instructions, pamphlets, or other written materials from your doctor or pharmacy?: 1 - Never What is the last grade level you completed in school?: BS in education   Interpreter Needed?: No  Information entered by :: Tiffany Hill,LPN   Past Medical History:  Diagnosis Date  . Breast CA ([facility]    Chemo/XRT/lumpectomy  . [place] [place]   left breast, radiation, chemo  . Cardiomyopathy secondary   . Hypersomnolence 09/21/2013  . Leg  edema, left 09/21/2013  . 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 Pacemaker   . Personal history of chemotherapy    2009  . Personal history of radiation therapy 2009  . Sinus tachycardia    Past Surgical History:  Procedure Laterality Date  . 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 CESAREAN SECTION  1983  . [place] INSERT / REPLACE / REMOVE PACEMAKER  2011   revision @ [facility] 05/13/2003   [facility] Comp- DR   Family History  Problem Relation Age of Onset  . Diabetes Mother   . Heart disease Mother   . Thyroid disease Mother   . Hypertension Father   . Cancer Father   . Alcohol abuse Father   . Diabetes Father   . Stroke Other        Family hx of CVA or stroke  . Diabetes Brother   . Diabetes Daughter   . Diabetes Paternal Uncle   . Diabetes Paternal Grandmother   . Breast cancer Maternal Aunt    Social History   Socioeconomic History  . Marital status: Single    Spouse name: Not on file  . Number of children: Not  on file  . Years of education: Not on file  . Highest education level: Bachelor's degree (e.g., BA, AB, BS)  Occupational History  . Occupation: Full time    Employer: [employer]  . Financial resource strain: Not hard at all  . Food insecurity:    Worry: Never true    Inability: Never true  . Transportation needs:    Medical: No    Non-medical: No  Tobacco Use  . Smoking status: Former Smoker    Last attempt to quit: 08/26/1981    Years since quitting: 36.5  . Smokeless tobacco: Never Used  Substance and Sexual Activity  . Alcohol use: Not Currently  . Drug use: No  . Sexual activity: Not on file  Lifestyle  . Physical activity:    Days per  week: 0 days    Minutes per session: 0 min  . Stress: Not at all  Relationships  . Social connections:    Talks on phone: More than three times a week    Gets together: More than three times a week    Attends religious service: More than 4 times per year    Active member of club or organization: Yes    Attends meetings of clubs or organizations: More than 4 times per year    Relationship status: Divorced  Other Topics Concern  . Not on file  Social History Narrative   Divorced   Gets regular exercise    Outpatient Encounter Medications as of 03/16/2018  Medication Sig  . ACCU-CHEK AVIVA PLUS test strip TEST BLOOD SUGAR ONCE DAILY  . aMILoride (MIDAMOR) 5 MG tablet Take 1 tablet (5 mg total) by mouth daily.  Marland Kitchen atenolol (TENORMIN) 50 MG tablet Take 1 tablet (50 mg total) by mouth daily.  . cetirizine (ZYRTEC) 5 MG tablet Take 5 mg by mouth as needed for allergies. Seasonal allergies  . chlorpheniramine (CHLOR-TRIMETON) 4 MG tablet Take 4 mg by mouth every 6 (six) hours as needed for allergies.  Marland Kitchen CINNAMON PO Take by mouth daily.  . magnesium oxide (MAG-OX) 400 MG tablet Take 400 mg by mouth 2 (two) times daily.  . metFORMIN (GLUCOPHAGE) 500 MG tablet Take 1 tablet (500 mg total) by mouth at bedtime.  . naproxen sodium (ANAPROX) 220 MG tablet Take 220 mg by mouth 2 (two) times daily with a meal.  . pregabalin (LYRICA) 50 MG capsule Take 1 capsule (50 mg total) by mouth 3 (three) times daily.  . ranitidine (ZANTAC) 150 MG capsule Take 150 mg by mouth 2 (two) times daily.  . SUMAtriptan Succinate (IMITREX PO) Take by mouth as needed.    . fluticasone (FLONASE) 50 MCG/ACT nasal spray Place 2 sprays into both nostrils daily. (Patient not taking: Reported on 03/16/2018)   No facility-administered encounter medications on file as of 03/16/2018.     Activities of Daily Living In your present state of health, do you have any difficulty performing the following activities: 03/16/2018  Hearing?  N  Vision? Y  Comment has cataracts- will follow up with my eye dr  Difficulty concentrating or making decisions? Y  Walking or climbing stairs? Y  Comment pain in muscles   Dressing or bathing? N  Doing errands, shopping? N  Preparing Food and eating ? N  Using the Toilet? N  In the past six months, have you accidently leaked urine? Y  Comment wears panty liner just in case  Do you have problems with loss  of bowel control? N  Managing your Medications? N  Managing your Finances? N  Housekeeping or managing your Housekeeping? N  Some recent data might be hidden    Patient Care Team: Lloyd Huger, MD as PCP - General (Oncology) Lloyd Huger, MD as Consulting Physician (Oncology) Deboraha Sprang, MD as Consulting Physician (Cardiology)    Assessment:   This is a routine wellness examination for [name].  Exercise Activities and Dietary recommendations Current Exercise Habits: Home exercise routine, Type of exercise: strength training/weights, Time (Minutes): 30, Frequency (Times/Week): 3, Weekly Exercise (Minutes/Week): 90, Intensity: Mild, Exercise limited by: None identified  Goals    . DIET - INCREASE WATER INTAKE     Recommend drinking at least 6-8 glasses of water a day        Fall Risk Fall Risk  03/16/2018 03/16/2018 09/09/2017 02/24/2017 11/18/2016  Falls in the past year? No No No No No  Number falls in past yr: - - - - -  Injury with Fall? - - - - -   Is the patient's home free of loose throw rugs in walkways, pet beds, electrical cords, etc?   yes      Grab bars in the bathroom? yes      Handrails on the stairs?   yes      Adequate lighting?   yes  Timed Get Up and Go performed: Completed in 8 seconds with no use of assistive devices, steady gait. No intervention needed at this time.   Depression Screen PHQ 2/9 Scores 03/16/2018 09/09/2017 02/24/2017 10/07/2016  PHQ - 2 Score 0 0 0 0  PHQ- 9 Score 2 - - -     Cognitive Function     6CIT Screen  03/16/2018  What Year? 0 points  What month? 0 points  What time? 0 points  Count back from 20 0 points  Months in reverse 0 points  Repeat phrase 0 points  Total Score 0    Immunization History  Administered Date(s) Administered  . Influenza,inj,Quad PF,6+ Mos 06/19/2015, 06/24/2016, 06/23/2017  . Pneumococcal Conjugate-13 04/01/2016  . Pneumococcal Polysaccharide-23 03/16/2018  . Tdap 05/22/2011  . Zoster 06/28/2014    Qualifies for Shingles Vaccine? Yes, discussed shingrix vaccine   Screening Tests Health Maintenance  Topic Date Due  . URINE MICROALBUMIN  10/01/2016  . OPHTHALMOLOGY EXAM  05/03/2017  . FOOT EXAM  10/07/2017  . HEMOGLOBIN A1C  03/09/2018  . INFLUENZA VACCINE  03/26/2018  . Fecal DNA (Cologuard)  09/01/2019  . MAMMOGRAM  01/07/2020  . TETANUS/TDAP  05/21/2021  . DEXA SCAN  Completed  . Hepatitis C Screening  Completed  . PNA vac Low Risk Adult  Completed    Cancer Screenings: Lung: Low Dose CT Chest recommended if Age 56-80 years, 30 pack-year currently smoking OR have quit w/in 15years. Patient does not qualify. Breast:  Up to date on Mammogram? Yes  01/06/2018 Up to date of Bone Density/Dexa? Yes 08/16/2014 Colorectal: cologuard completed 08/31/2016  Additional Screenings:  Hepatitis C Screening: completed 07/29/2016     Plan:    I have personally reviewed and addressed the Medicare Annual Wellness questionnaire and have noted the following in the patient's chart:  A. Medical and social history B. Use of alcohol, tobacco or illicit drugs  C. Current medications and supplements D. Functional ability and status E.  Nutritional status F.  Physical activity G. Advance directives H. List of other physicians I.  Hospitalizations, surgeries, and ER visits in previous  12 months J.  [place] such as hearing and vision if needed, cognitive and depression L. Referrals and appointments   In addition, I have reviewed and discussed with  patient certain preventive protocols, quality metrics, and best practice recommendations. A written personalized care plan for preventive services as well as general preventive health recommendations were provided to patient.   Signed,  Tyler Aas, LPN Nurse Health Advisor   Nurse Notes:needs refill on flonase and imitrex  Discussed diabetic eye exam, she will make an appt with my eye doctor and have results faxed when completed  Due for diabetic foot exam

## 2018-03-16 NOTE — Progress Notes (Signed)
Wt 226 lb (102.5 kg)   BMI 37.65 kg/m    Subjective:    Patient ID: [name], female    DOB: May 16, 1951, 67 y.o.   MRN: [id number]  HPI: [name] is a 67 y.o. female  Chief Complaint  Patient presents with  . Follow-up  . Diabetes  . Hyperlipidemia  . Hypertension  Patient doing well all in all no complaints has been putting off and cholesterol medicine is been trying to do cholesterol with diet nutrition weight loss which is been unsuccessful. Blood pressure control is been good. Has had some recurrence of headaches wants refills on sumatriptan and.   Relevant past medical, surgical, family and social history reviewed and updated as indicated. Interim medical history since our last visit reviewed. Allergies and medications reviewed and updated.  Review of Systems  Constitutional: Negative.   Respiratory: Negative.   Cardiovascular: Negative.     Per HPI unless specifically indicated above     Objective:    Wt 226 lb (102.5 kg)   BMI 37.65 kg/m   Wt Readings from Last 3 Encounters:  03/16/18 226 lb (102.5 kg)  03/16/18 226 lb (102.5 kg)  09/09/17 233 lb (105.7 kg)    Physical Exam  Constitutional: She is oriented to person, place, and time. She appears well-developed and well-nourished.  HENT:  Head: Normocephalic and atraumatic.  Eyes: Conjunctivae and EOM are normal.  Neck: Normal range of motion.  Cardiovascular: Normal rate, regular rhythm and normal heart sounds.  Pulmonary/Chest: Effort normal and breath sounds normal.  Musculoskeletal: Normal range of motion.  Neurological: She is alert and oriented to person, place, and time.  Skin: No erythema.  Psychiatric: She has a normal mood and affect. Her behavior is normal. Judgment and thought content normal.    Results for orders placed or performed in visit on 12/29/17  Magnesium  Result Value Ref Range   Magnesium 1.5 (L) 1.7 - 2.4 mg/dL      Assessment & Plan:   Problem List Items  Addressed This Visit      Cardiovascular and Mediastinum   Essential hypertension - Primary    The current medical regimen is effective;  continue present plan and medications.       Relevant Medications   atorvastatin (LIPITOR) 40 MG tablet   Other Relevant Orders   Basic metabolic panel   Bayer DCA Hb A1c Waived   LP+ALT+AST Piccolo, Waived   Microalbumin, Urine Waived     Endocrine   Type 2 diabetes mellitus ([place])    The current medical regimen is effective;  continue present plan and medications.       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

## 2018-03-17 ENCOUNTER — Encounter: Payer: Self-pay | Admitting: Family Medicine

## 2018-03-17 LAB — BASIC METABOLIC PANEL
BUN/Creatinine Ratio: 22 (ref 12–28)
BUN: 18 mg/dL (ref 8–27)
CALCIUM: 9.7 mg/dL (ref 8.7–10.3)
CHLORIDE: 100 mmol/L (ref 96–106)
CO2: 24 mmol/L (ref 20–29)
Creatinine, Ser: 0.83 mg/dL (ref 0.57–1.00)
GFR calc Af Amer: 84 mL/min/{1.73_m2} (ref >59)
GFR calc non Af Amer: 73 mL/min/{1.73_m2} (ref >59)
GLUCOSE: 110 mg/dL — AB (ref 65–99)
Potassium: 4.7 mmol/L (ref 3.5–5.2)
Sodium: 140 mmol/L (ref 134–144)

## 2018-06-22 ENCOUNTER — Ambulatory Visit: Payer: Medicare Other | Admitting: Family Medicine

## 2018-06-22 ENCOUNTER — Encounter: Payer: Self-pay | Admitting: Family Medicine

## 2018-06-22 VITALS — BP 110/58 | HR 71 | Temp 97.9°F | Wt 225.0 lb

## 2018-06-22 DIAGNOSIS — E7800 Pure hypercholesterolemia, unspecified: Secondary | ICD-10-CM | POA: Diagnosis not present

## 2018-06-22 DIAGNOSIS — Z23 Encounter for immunization: Secondary | ICD-10-CM | POA: Diagnosis not present

## 2018-06-22 DIAGNOSIS — E119 Type 2 diabetes mellitus without complications: Secondary | ICD-10-CM | POA: Diagnosis not present

## 2018-06-22 DIAGNOSIS — I10 Essential (primary) hypertension: Secondary | ICD-10-CM

## 2018-06-22 LAB — LP+ALT+AST PICCOLO, WAIVED
ALT (SGPT) Piccolo, Waived: 28 U/L (ref 10–47)
AST (SGOT) Piccolo, Waived: 25 U/L (ref 11–38)
CHOL/HDL RATIO PICCOLO,WAIVE: 3.1 mg/dL
Cholesterol Piccolo, Waived: 134 mg/dL (ref <200)
HDL CHOL PICCOLO, WAIVED: 43 mg/dL — AB (ref >59)
LDL Chol Calc Piccolo Waived: 68 mg/dL (ref <100)
TRIGLYCERIDES PICCOLO,WAIVED: 111 mg/dL (ref <150)
VLDL CHOL CALC PICCOLO,WAIVE: 22 mg/dL (ref <30)

## 2018-06-22 MED ORDER — FLUTICASONE PROPIONATE 50 MCG/ACT NA SUSP: 2.0000 | Freq: Every day | NASAL | 12 refills | Status: DC

## 2018-06-22 NOTE — Assessment & Plan Note (Addendum)
The current medical regimen is effective;  continue present plan and medications. With cholesterol results in hand patient is doing wonderful with excellent control and good liver function.

## 2018-06-22 NOTE — Assessment & Plan Note (Signed)
The current medical regimen is effective;  continue present plan and medications.  

## 2018-06-22 NOTE — Assessment & Plan Note (Addendum)
The current medical regimen is effective;  continue present plan and medications. Will try checking blood sugars during hot flash spells at night to see if low blood sugar or other type issues going on.

## 2018-06-22 NOTE — Progress Notes (Signed)
BP (!) 110/58   Pulse 71   Temp 97.9 F (36.6 C) (Oral)   Wt 225 lb (102.1 kg)   SpO2 99%   BMI 38.02 kg/m    Subjective:    Patient ID: [name], female    DOB: 10-19-1950, 67 y.o.   MRN: [id number]  HPI: [name] is a 67 y.o. female  Chief Complaint  Patient presents with  . Follow-up  . Hyperlipidemia  . Ankle Injury    Both,   Patient's been trying to do better with cholesterol diet and taking medications without any problems. Has noticed her blood sugar on home monitoring has been slightly elevated but on chart review hemoglobin A1c's have been excellent just above the nondiabetic stage. Patient also noticed some hot flashes at night and went through menopause approximately 20 years ago.  No sleep apnea symptoms has had sleep study in the past. Has not checked blood sugar during the spells. Cholesterol doing well and been working on diet and nutrition.  Relevant past medical, surgical, family and social history reviewed and updated as indicated. Interim medical history since our last visit reviewed. Allergies and medications reviewed and updated.  Review of Systems  Constitutional: Negative.   Respiratory: Negative.   Cardiovascular: Negative.      Per HPI unless specifically indicated above     Objective:    BP (!) 110/58   Pulse 71   Temp 97.9 F (36.6 C) (Oral)   Wt 225 lb (102.1 kg)   SpO2 99%   BMI 38.02 kg/m   Wt Readings from Last 3 Encounters:  06/22/18 225 lb (102.1 kg)  03/16/18 226 lb (102.5 kg)  03/16/18 226 lb (102.5 kg)    Physical Exam  Constitutional: She is oriented to person, place, and time. She appears well-developed and well-nourished.  HENT:  Head: Normocephalic and atraumatic.  Eyes: Conjunctivae and EOM are normal.  Neck: Normal range of motion.  Cardiovascular: Normal rate, regular rhythm and normal heart sounds.  Pulmonary/Chest: Effort normal and breath sounds normal.  Musculoskeletal: Normal range of motion.    Neurological: She is alert and oriented to person, place, and time.  Skin: No erythema.  Psychiatric: She has a normal mood and affect. Her behavior is normal. Judgment and thought content normal.    Results for orders placed or performed in visit on 49/70/26  Basic metabolic panel  Result Value Ref Range   Glucose 110 (H) 65 - 99 mg/dL   BUN 18 8 - 27 mg/dL   Creatinine, Ser 0.83 0.57 - 1.00 mg/dL   GFR calc non Af Amer 73 >59 mL/min/1.73   GFR calc Af Amer 84 >59 mL/min/1.73   BUN/Creatinine Ratio 22 12 - 28   Sodium 140 134 - 144 mmol/L   Potassium 4.7 3.5 - 5.2 mmol/L   Chloride 100 96 - 106 mmol/L   CO2 24 20 - 29 mmol/L   Calcium 9.7 8.7 - 10.3 mg/dL  Bayer DCA Hb A1c Waived  Result Value Ref Range   HB A1C (BAYER DCA - WAIVED) 5.9 <7.0 %  LP+ALT+AST Piccolo, Waived  Result Value Ref Range   ALT (SGPT) Piccolo, Waived 29 10 - 47 U/L   AST (SGOT) Piccolo, Waived 21 11 - 38 U/L   Cholesterol Piccolo, Waived 231 (H) <200 mg/dL   HDL Chol Piccolo, Waived 54 (L) >59 mg/dL   Triglycerides Piccolo,Waived 204 (H) <150 mg/dL   Chol/HDL Ratio Piccolo,Waive 4.3 mg/dL   LDL  Chol Calc Piccolo Waived 136 (H) <100 mg/dL   VLDL Chol Calc Piccolo,Waive 41 (H) <30 mg/dL  Microalbumin, Urine Waived  Result Value Ref Range   Microalb, Ur Waived 30 (H) 0 - 19 mg/L   Creatinine, Urine Waived 100 10 - 300 mg/dL   Microalb/Creat Ratio <30 <30 mg/g      Assessment & Plan:   Problem List Items Addressed This Visit      Cardiovascular and Mediastinum   Essential hypertension    The current medical regimen is effective;  continue present plan and medications.         Endocrine   Type 2 diabetes mellitus ([place])    The current medical regimen is effective;  continue present plan and medications. Will try checking blood sugars during hot flash spells at night to see if low blood sugar or other type issues going on.        Other   Hypercholesteremia - Primary    The current medical  regimen is effective;  continue present plan and medications. With cholesterol results in hand patient is doing wonderful with excellent control and good liver function.       Relevant Orders   LP+ALT+AST Piccolo, [place]    Other Visit Diagnoses    Needs flu shot       Relevant Orders   Flu vaccine HIGH DOSE PF (Fluzone High dose) (Completed)       Follow up plan: Return in about 3 months (around 09/22/2018) for Hemoglobin A1c, Physical Exam.

## 2018-06-22 NOTE — Patient Instructions (Signed)

## 2018-06-23 ENCOUNTER — Ambulatory Visit: Payer: Medicare Other | Admitting: Oncology

## 2018-06-23 ENCOUNTER — Ambulatory Visit: Payer: Medicare Other

## 2018-06-23 ENCOUNTER — Other Ambulatory Visit: Payer: Medicare Other

## 2018-06-26 ENCOUNTER — Other Ambulatory Visit: Payer: Self-pay | Admitting: *Deleted

## 2018-06-26 NOTE — Progress Notes (Signed)
m °

## 2018-06-28 NOTE — Progress Notes (Signed)
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  INTERVAL HISTORY: Patient returns to clinic today for repeat laboratory can routine yearly evaluation.  She continues to have occasional cramping, but otherwise feels well and is asymptomatic. She does not complain of neuropathy today. She has no neurologic complaints.  She denies any recent fevers or illnesses.  She denies any chest pain, shortness of breath, or hemoptysis.  She has no nausea, vomiting, constipation, or diarrhea.  She has no urinary complaints.  Patient feels at her baseline offers no specific complaints today.  REVIEW OF SYSTEMS:   Review of Systems  Constitutional: Negative.  Negative for fever, malaise/fatigue and weight loss.  Respiratory: Negative.  Negative for cough and shortness of breath.   Cardiovascular: Negative.  Negative for chest pain and leg swelling.  Gastrointestinal: Negative.  Negative for abdominal pain.  Genitourinary: Negative.  Negative for dysuria.  Musculoskeletal: Negative.  Negative for back pain.       Occasional muscle cramping  Skin: Negative.  Negative for rash.  Neurological: Negative.  Negative for sensory change, focal weakness, weakness and headaches.  Psychiatric/Behavioral: Negative.  The patient is not nervous/anxious.    As per HPI. Otherwise, a complete review of systems is negative.   PAST MEDICAL HISTORY: Past Medical History:  Diagnosis Date  . Breast CA ([place])    Chemo/XRT/lumpectomy  . Breast cancer ([place]) 09/2007   left breast, radiation, chemo  . Cardiomyopathy secondary     . Hypersomnolence 09/21/2013  . Leg edema, left 09/21/2013  . 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 Pacemaker   . Personal history of chemotherapy    2009  . Personal history of radiation therapy 2009  . Sinus tachycardia     PAST SURGICAL HISTORY: Past Surgical History:  Procedure Laterality Date  . 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 CESAREAN SECTION  1983  . [place]  . INSERT / REPLACE / REMOVE PACEMAKER  2011   revision @ [place]  05/13/2003   [facility] Comp - DR    FAMILY HISTORY Family History  Problem Relation Age of Onset  . Diabetes Mother   . Heart disease Mother   . Thyroid disease Mother   . Hypertension Father   . Cancer Father   . Alcohol abuse Father   . Diabetes Father   . Stroke Other        Family hx of CVA or stroke  . Diabetes Brother   . Diabetes Daughter   . Diabetes Paternal Uncle   . Diabetes Paternal Grandmother   . Breast cancer Maternal Aunt        ADVANCED DIRECTIVES:    HEALTH MAINTENANCE: Social History   Tobacco Use  . Smoking status: Former Smoker    Last attempt to quit: 08/26/1981    Years since quitting: 36.8  . Smokeless tobacco: Never Used  Substance Use Topics  .  Alcohol use: Not Currently  . Drug use: No     Colonoscopy:  PAP:  Bone density:  Lipid panel:  Allergies  Allergen Reactions  . Penicillins Swelling    Causes organs to swell Other reaction(s): Other (See Comments) Organs flipped over per patient; skin starting splitting open and blood vessels burst in legs;  Causes organs to swell  . Levofloxacin Swelling    Leg swelling and muscle weakness Other reaction(s): Other (See Comments) Tendon pain and weakness in muscles Leg  swelling and muscle weakness    Current Outpatient Medications  Medication Sig Dispense Refill  . ACCU-CHEK AVIVA PLUS test strip TEST BLOOD SUGAR ONCE DAILY 100 each 12  . aMILoride (MIDAMOR) 5 MG tablet Take 1 tablet (5 mg total) by mouth daily. 30 tablet 12  . atenolol (TENORMIN) 50 MG tablet Take 1 tablet (50 mg total) by mouth daily. 90 tablet 12  . atorvastatin (LIPITOR) 40 MG tablet Take 1 tablet (40 mg total) by mouth daily. 90 tablet 3  . cetirizine (ZYRTEC) 5 MG tablet Take 5 mg by mouth as needed for allergies. Seasonal allergies    . chlorpheniramine (CHLOR-TRIMETON) 4 MG tablet Take 4 mg by mouth every 6 (six) hours as needed for allergies.    Marland Kitchen CINNAMON PO Take by mouth daily.    . fluticasone (FLONASE) 50 MCG/ACT nasal spray Place 2 sprays into both nostrils daily. 16 g 12  . magnesium oxide (MAG-OX) 400 MG tablet Take 400 mg by mouth 2 (two) times daily.    . metFORMIN (GLUCOPHAGE) 500 MG tablet Take 1 tablet (500 mg total) by mouth at bedtime. 90 tablet 2  . naproxen sodium (ANAPROX) 220 MG tablet Take 220 mg by mouth 2 (two) times daily with a meal.    . pregabalin (LYRICA) 50 MG capsule Take 1 capsule (50 mg total) by mouth 3 (three) times daily. 270 capsule 1  . ranitidine (ZANTAC) 150 MG capsule Take 150 mg by mouth 2 (two) times daily.    . SUMAtriptan (IMITREX) 100 MG tablet Take 1 tablet (100 mg total) by mouth as needed. 10 tablet 12   No current facility-administered medications for this visit.     OBJECTIVE: Vitals:   06/29/18 1004  BP: 117/75  Pulse: 68  Resp: 18  Temp: 97.8 F (36.6 C)     Body mass index is 38.11 kg/m.    ECOG FS:0 - Asymptomatic  General: Well-developed, well-nourished, no acute distress. Eyes: Pink conjunctiva, anicteric sclera. HEENT: Normocephalic, moist mucous membranes. Breast: Bilateral breast and axilla without lumps or masses. Lungs: Clear to auscultation bilaterally. Heart: Regular rate and rhythm. No rubs, murmurs, or  gallops. Abdomen: Soft, nontender, nondistended. No organomegaly noted, normoactive bowel sounds. Musculoskeletal: No edema, cyanosis, or clubbing. Neuro: Alert, answering all questions appropriately. Cranial nerves grossly intact. Skin: No rashes or petechiae noted. Psych: Normal affect.  LAB RESULTS:  Lab Results  Component Value Date   NA 140 03/16/2018   K 4.7 03/16/2018   CL 100 03/16/2018   CO2 24 03/16/2018   GLUCOSE 110 (H) 03/16/2018   BUN 18 03/16/2018   CREATININE 0.83 03/16/2018   CALCIUM 9.7 03/16/2018   PROT 6.9 09/09/2017   ALBUMIN 4.7 09/09/2017   AST 25 06/22/2018   ALT 28 06/22/2018   ALKPHOS 85 09/09/2017   BILITOT 0.4 09/09/2017   GFRNONAA 73 03/16/2018   GFRAA 84 03/16/2018    Lab Results  Component Value Date   WBC 7.4 09/09/2017  NEUTROABS 4.0 09/09/2017   HGB 14.1 09/09/2017   HCT 40.6 09/09/2017   MCV 88 09/09/2017   PLT 231 09/09/2017     STUDIES: No results [name].  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  Patient completed year long maintenance Herceptin on Jan 02, 2009.  Patient's most recent mammogram on Jan 06, 2018 was reported as BI-RADS 1. Repeat in May 2020.  Return to clinic in 1 year for routine evaluation.   2.  Hypomagnesemia/cramping: Patient's magnesium is 1.5 today.  We will proceed with 2 g IV magnesium.  Continue amiloride 5 mg daily and 3 tabs Slow-Mag twice a day.  Return to clinic in 4 and 8 months for laboratory work and consideration of IV magnesium.  Patient will receive magnesium infusion if her levels are <1.6 or if she is symptomatic. 3.  Pacemaker: Continue monitoring and evaluation per cardiology.    Patient expressed understanding and was in agreement with this plan. She also understands that  She can call clinic at any time with any questions, concerns, or complaints.    Lloyd Huger, MD   06/29/2018 1:40 PM

## 2018-06-29 ENCOUNTER — Inpatient Hospital Stay: Payer: Medicare Other | Attending: Oncology

## 2018-06-29 ENCOUNTER — Encounter: Payer: Self-pay | Admitting: Oncology

## 2018-06-29 ENCOUNTER — Other Ambulatory Visit: Payer: Self-pay

## 2018-06-29 ENCOUNTER — Inpatient Hospital Stay: Payer: Medicare Other

## 2018-06-29 ENCOUNTER — Inpatient Hospital Stay (HOSPITAL_BASED_OUTPATIENT_CLINIC_OR_DEPARTMENT_OTHER): Payer: Medicare Other | Admitting: Oncology

## 2018-06-29 VITALS — BP 117/75 | HR 68 | Temp 97.8°F | Resp 18 | Wt 225.5 lb

## 2018-06-29 DIAGNOSIS — Z950 Presence of cardiac pacemaker: Secondary | ICD-10-CM | POA: Diagnosis not present

## 2018-06-29 DIAGNOSIS — R252 Cramp and spasm: Secondary | ICD-10-CM | POA: Diagnosis not present

## 2018-06-29 DIAGNOSIS — Z87891 Personal history of nicotine dependence: Secondary | ICD-10-CM

## 2018-06-29 DIAGNOSIS — C50912 Malignant neoplasm of unspecified site of left female breast: Secondary | ICD-10-CM

## 2018-06-29 DIAGNOSIS — Z853 Personal history of malignant neoplasm of breast: Secondary | ICD-10-CM | POA: Insufficient documentation

## 2018-06-29 LAB — MAGNESIUM: Magnesium: 1.5 mg/dL — ABNORMAL LOW (ref 1.7–2.4)

## 2018-06-29 MED ORDER — SODIUM CHLORIDE 0.9 % IV SOLN
Freq: Once | INTRAVENOUS | Status: AC
  Administered 2018-06-29: 11:00:00 via INTRAVENOUS
  Filled 2018-06-29: qty 250

## 2018-06-29 MED ORDER — MAGNESIUM SULFATE 2 GM/50ML IV SOLN
2.0000 g | Freq: Once | INTRAVENOUS | Status: AC
  Administered 2018-06-29: 2 g via INTRAVENOUS
  Filled 2018-06-29: qty 50

## 2018-06-29 NOTE — Progress Notes (Signed)
Pt in for yearly follow up, denies any difficulties or concerns today.

## 2018-09-02 ENCOUNTER — Ambulatory Visit: Payer: Self-pay

## 2018-09-02 NOTE — Telephone Encounter (Signed)
Patient notified of Dr. Johnson's message. Pt verbalized understanding. 

## 2018-09-02 NOTE — Telephone Encounter (Signed)
Incoming call from Patient who states that she is a diabetic and her church is starting a 24 hour fast.  She know that she cant fast for the  24 hours.  Patient is inquiring if she can fast from  6pm to 1000 am?  The fast is to start at 6p  Thursday.    Desires a response before then Please.                                   Answer Assessment - Initial Assessment Questions 1. REASON FOR CALL or QUESTION: "What is your reason for calling today?" or "How can I best help you?" or "What question do you have that I can help answer?"     Fasting okay?  Protocols used: INFORMATION ONLY CALL-A-AH

## 2018-09-02 NOTE — Telephone Encounter (Signed)
She should be good for that small amount of time as she's only on metformin. If she starts feeling hypoglycemic, she should eat something

## 2018-09-21 ENCOUNTER — Ambulatory Visit (INDEPENDENT_AMBULATORY_CARE_PROVIDER_SITE_OTHER): Payer: Medicare Other | Admitting: Family Medicine

## 2018-09-21 ENCOUNTER — Encounter: Payer: Self-pay | Admitting: Family Medicine

## 2018-09-21 DIAGNOSIS — E119 Type 2 diabetes mellitus without complications: Secondary | ICD-10-CM

## 2018-09-21 DIAGNOSIS — G620 Drug-induced polyneuropathy: Secondary | ICD-10-CM | POA: Diagnosis not present

## 2018-09-21 DIAGNOSIS — T451X5A Adverse effect of antineoplastic and immunosuppressive drugs, initial encounter: Secondary | ICD-10-CM

## 2018-09-21 DIAGNOSIS — I10 Essential (primary) hypertension: Secondary | ICD-10-CM

## 2018-09-21 DIAGNOSIS — C50919 Malignant neoplasm of unspecified site of unspecified female breast: Secondary | ICD-10-CM

## 2018-09-21 DIAGNOSIS — Z0000 Encounter for general adult medical examination without abnormal findings: Secondary | ICD-10-CM | POA: Diagnosis not present

## 2018-09-21 DIAGNOSIS — Z7189 Other specified counseling: Secondary | ICD-10-CM

## 2018-09-21 DIAGNOSIS — E7800 Pure hypercholesterolemia, unspecified: Secondary | ICD-10-CM

## 2018-09-21 LAB — URINALYSIS, ROUTINE W REFLEX MICROSCOPIC
Bilirubin, UA: NEGATIVE
Glucose, UA: NEGATIVE
Ketones, UA: NEGATIVE
Nitrite, UA: NEGATIVE
Protein, UA: NEGATIVE
RBC, UA: NEGATIVE
Specific Gravity, UA: 1.025 (ref 1.005–1.030)
Urobilinogen, Ur: 0.2 mg/dL (ref 0.2–1.0)
pH, UA: 5.5 (ref 5.0–7.5)

## 2018-09-21 LAB — MICROSCOPIC EXAMINATION
Bacteria, UA: NONE SEEN
RBC, UA: NONE SEEN /hpf (ref 0–2)
WBC, UA: NONE SEEN /hpf (ref 0–5)

## 2018-09-21 LAB — BAYER DCA HB A1C WAIVED: HB A1C (BAYER DCA - WAIVED): 6.1 % (ref <7.0)

## 2018-09-21 MED ORDER — ATENOLOL 50 MG PO TABS: 50.0000 mg | ORAL_TABLET | Freq: Every day | ORAL | 4 refills | Status: DC

## 2018-09-21 MED ORDER — GLUCOSE BLOOD VI STRP: ORAL_STRIP | 12 refills | Status: AC

## 2018-09-21 MED ORDER — METFORMIN HCL 500 MG PO TABS: 500.0000 mg | ORAL_TABLET | Freq: Every day | ORAL | 4 refills | Status: DC

## 2018-09-21 MED ORDER — PREGABALIN 50 MG PO CAPS: 50.0000 mg | ORAL_CAPSULE | Freq: Three times a day (TID) | ORAL | 1 refills | Status: DC

## 2018-09-21 MED ORDER — ATORVASTATIN CALCIUM 40 MG PO TABS: 40.0000 mg | ORAL_TABLET | Freq: Every day | ORAL | 4 refills | Status: DC

## 2018-09-21 MED ORDER — AMILORIDE HCL 5 MG PO TABS: 5.0000 mg | ORAL_TABLET | Freq: Every day | ORAL | 4 refills | Status: DC

## 2018-09-21 MED ORDER — FLUTICASONE PROPIONATE 50 MCG/ACT NA SUSP: 2.0000 | Freq: Every day | NASAL | 12 refills | Status: DC

## 2018-09-21 NOTE — Progress Notes (Signed)
BP (!) 124/59 (BP Location: Left Arm, Patient Position: Sitting, Cuff Size: Normal)   Pulse (!) 54   Temp (!) 97.4 F (36.3 C) (Oral)   Ht 5\' 4"  (1.626 m)   Wt 224 lb 3.2 oz (101.7 kg)   SpO2 100%   BMI 38.48 kg/m    Subjective:    Patient ID: [name], female    DOB: 1951-03-31, 68 y.o.   MRN: [id number]  HPI: [name] is a 68 y.o. female  Chief Complaint  Patient presents with  . Annual Exam  . Gastroesophageal Reflux    Zantac off the market. Needs to regimen.    Patient with tendency towards glaucoma and is looking to have laser treatment to her iris to relieve the pressure. Patient having frequent eye exams because of her diabetes but also now because of glaucoma. Patient otherwise doing well with diabetes no low blood sugar spells or issues. Same for cholesterol blood pressure no issues taking medications without problems and faithfully and good effect. Patient has not had a migraine for over a year still wants to keep sumatriptan on standby. Neuropathy doing well with pregabalin.  Still having some hand symptoms but rest of her body seems to be doing well.  Relevant past medical, surgical, family and social history reviewed and updated as indicated. Interim medical history since our last visit reviewed. Allergies and medications reviewed and updated.  Review of Systems  Constitutional: Negative.   HENT: Negative.   Eyes: Negative.   Respiratory: Negative.   Cardiovascular: Negative.   Gastrointestinal: Negative.   Endocrine: Negative.   Genitourinary: Negative.   Musculoskeletal: Negative.   Skin: Negative.   Allergic/Immunologic: Negative.   Neurological: Negative.   Hematological: Negative.   Psychiatric/Behavioral: Negative.     Per HPI unless specifically indicated above     Objective:    BP (!) 124/59 (BP Location: Left Arm, Patient Position: Sitting, Cuff Size: Normal)   Pulse (!) 54   Temp (!) 97.4 F (36.3 C) (Oral)   Ht 5\' 4"  (1.626 m)    Wt 224 lb 3.2 oz (101.7 kg)   SpO2 100%   BMI 38.48 kg/m   Wt Readings from Last 3 Encounters:  09/21/18 224 lb 3.2 oz (101.7 kg)  06/29/18 225 lb 8 oz (102.3 kg)  06/22/18 225 lb (102.1 kg)    Physical Exam Constitutional:      Appearance: She is well-developed.     Comments: Breast exam and mamo done at cancer center  HENT:     Head: Normocephalic and atraumatic.     Right Ear: External ear normal.     Left Ear: External ear normal.     Nose: Nose normal.  Eyes:     Conjunctiva/sclera: Conjunctivae normal.     Pupils: Pupils are equal, round, and reactive to light.  Neck:     Musculoskeletal: Normal range of motion and neck supple.     Vascular: No carotid bruit.  Cardiovascular:     Rate and Rhythm: Normal rate and regular rhythm.     Heart sounds: Normal heart sounds. No murmur.  Pulmonary:     Effort: Pulmonary effort is normal.     Breath sounds: Normal breath sounds.  Abdominal:     General: Bowel sounds are normal.     Palpations: Abdomen is soft.  Musculoskeletal: Normal range of motion.  Skin:    Findings: No rash.  Neurological:     Mental Status: She is alert  and oriented to person, place, and time.  Psychiatric:        Behavior: Behavior normal.        Thought Content: Thought content normal.        Judgment: Judgment normal.     Results for orders placed or performed in visit on 06/29/18  Magnesium  Result Value Ref Range   Magnesium 1.5 (L) 1.7 - 2.4 mg/dL      Assessment & Plan:   Problem List Items Addressed This Visit      Cardiovascular and Mediastinum   Essential hypertension    The current medical regimen is effective;  continue present plan and medications.       Relevant Medications   atorvastatin (LIPITOR) 40 MG tablet   aMILoride (MIDAMOR) 5 MG tablet   atenolol (TENORMIN) 50 MG tablet   Other Relevant Orders   Comprehensive metabolic panel   CBC with Differential/Platelet   TSH   Urinalysis, Routine w reflex microscopic      Endocrine   Type 2 diabetes mellitus ([place])    The current medical regimen is effective;  continue present plan and medications.       Relevant Medications   atorvastatin (LIPITOR) 40 MG tablet   metFORMIN (GLUCOPHAGE) 500 MG tablet   Other Relevant Orders   Bayer DCA Hb A1c Waived   Comprehensive metabolic panel   CBC with Differential/Platelet   TSH     Nervous and Auditory   Chemotherapy-induced neuropathy (HCC)    The current medical regimen is effective;  continue present plan and medications.       Relevant Medications   pregabalin (LYRICA) 50 MG capsule   Other Relevant Orders   TSH     Other   Hypercholesteremia    The current medical regimen is effective;  continue present plan and medications.       Relevant Medications   atorvastatin (LIPITOR) 40 MG tablet   aMILoride (MIDAMOR) 5 MG tablet   atenolol (TENORMIN) 50 MG tablet   Other Relevant Orders   Lipid panel   TSH   Advanced care planning/counseling discussion    A voluntary discussion about advanced care planning including explanation and discussion of advanced directives was extentively discussed with the patient.  Explained about the healthcare proxy and living will was reviewed and packet with forms with expiration of how to fill them out was given.  Time spent: Encounter 16+ min individuals present: Patient      Breast cancer [facility])    Patient still getting magnesium infusions because of chemo from breast cancer which is resolved but still having sequela I of magnesium loss requiring  Infusions of magnesium      Relevant Orders   CBC with Differential/Platelet   TSH   Urinalysis, Routine w reflex microscopic       Follow up plan: Return in about 6 months (around 03/22/2019) for Hemoglobin A1c, BMP,  Lipids, ALT, AST.

## 2018-09-21 NOTE — Assessment & Plan Note (Signed)
Patient still getting magnesium infusions because of chemo from breast cancer which is resolved but still having sequela I of magnesium loss requiring  Infusions of magnesium

## 2018-09-21 NOTE — Assessment & Plan Note (Signed)
The current medical regimen is effective;  continue present plan and medications.  

## 2018-09-21 NOTE — Assessment & Plan Note (Signed)
A voluntary discussion about advanced care planning including explanation and discussion of advanced directives was extentively discussed with the patient.  Explained about the healthcare proxy and living will was reviewed and packet with forms with expiration of how to fill them out was given.  Time spent: Encounter 16+ min individuals present: Patient 

## 2018-09-22 ENCOUNTER — Encounter: Payer: Self-pay | Admitting: Family Medicine

## 2018-09-22 LAB — COMPREHENSIVE METABOLIC PANEL
ALBUMIN: 4.7 g/dL (ref 3.8–4.8)
ALK PHOS: 97 IU/L (ref 39–117)
ALT: 23 IU/L (ref 0–32)
AST: 23 IU/L (ref 0–40)
Albumin/Globulin Ratio: 2.2 (ref 1.2–2.2)
BUN / CREAT RATIO: 20 (ref 12–28)
BUN: 15 mg/dL (ref 8–27)
Bilirubin Total: 0.5 mg/dL (ref 0.0–1.2)
CO2: 23 mmol/L (ref 20–29)
Calcium: 9.6 mg/dL (ref 8.7–10.3)
Chloride: 102 mmol/L (ref 96–106)
Creatinine, Ser: 0.75 mg/dL (ref 0.57–1.00)
GFR calc Af Amer: 95 mL/min/{1.73_m2} (ref >59)
GFR calc non Af Amer: 82 mL/min/{1.73_m2} (ref >59)
Globulin, Total: 2.1 g/dL (ref 1.5–4.5)
Glucose: 86 mg/dL (ref 65–99)
Potassium: 4.1 mmol/L (ref 3.5–5.2)
Sodium: 141 mmol/L (ref 134–144)
TOTAL PROTEIN: 6.8 g/dL (ref 6.0–8.5)

## 2018-09-22 LAB — CBC WITH DIFFERENTIAL/PLATELET
Basophils Absolute: 0.1 10*3/uL (ref 0.0–0.2)
Basos: 1 %
EOS (ABSOLUTE): 0.2 10*3/uL (ref 0.0–0.4)
EOS: 3 %
HEMATOCRIT: 42.2 % (ref 34.0–46.6)
HEMOGLOBIN: 14 g/dL (ref 11.1–15.9)
Immature Grans (Abs): 0 10*3/uL (ref 0.0–0.1)
Immature Granulocytes: 1 %
Lymphocytes Absolute: 1.7 10*3/uL (ref 0.7–3.1)
Lymphs: 30 %
MCH: 29.9 pg (ref 26.6–33.0)
MCHC: 33.2 g/dL (ref 31.5–35.7)
MCV: 90 fL (ref 79–97)
Monocytes Absolute: 0.4 10*3/uL (ref 0.1–0.9)
Monocytes: 8 %
NEUTROS ABS: 3.2 10*3/uL (ref 1.4–7.0)
Neutrophils: 57 %
Platelets: 191 10*3/uL (ref 150–450)
RBC: 4.68 x10E6/uL (ref 3.77–5.28)
RDW: 13.1 % (ref 11.7–15.4)
WBC: 5.5 10*3/uL (ref 3.4–10.8)

## 2018-09-22 LAB — LIPID PANEL
Chol/HDL Ratio: 3 ratio (ref 0.0–4.4)
Cholesterol, Total: 144 mg/dL (ref 100–199)
HDL: 48 mg/dL (ref >39)
LDL Calculated: 71 mg/dL (ref 0–99)
Triglycerides: 123 mg/dL (ref 0–149)
VLDL Cholesterol Cal: 25 mg/dL (ref 5–40)

## 2018-09-22 LAB — TSH: TSH: 3.12 u[IU]/mL (ref 0.450–4.500)

## 2018-10-04 ENCOUNTER — Other Ambulatory Visit: Payer: Self-pay | Admitting: Family Medicine

## 2018-10-04 DIAGNOSIS — I10 Essential (primary) hypertension: Secondary | ICD-10-CM

## 2018-10-05 NOTE — Telephone Encounter (Signed)
Patient called and advised that we received a refill request from [facility] and those same medications were sent to [facility] on 09/21/18 #90/4 refills, she says she is switching to [facility] and no longer using [facility].

## 2018-10-26 ENCOUNTER — Inpatient Hospital Stay: Payer: Medicare Other

## 2018-11-02 ENCOUNTER — Inpatient Hospital Stay: Payer: Medicare Other | Attending: Oncology

## 2018-11-02 ENCOUNTER — Other Ambulatory Visit: Payer: Self-pay

## 2018-11-02 ENCOUNTER — Inpatient Hospital Stay: Payer: Medicare Other

## 2018-11-02 LAB — MAGNESIUM: Magnesium: 1.6 mg/dL — ABNORMAL LOW (ref 1.7–2.4)

## 2018-12-25 HISTORY — PX: EYE SURGERY: SHX253

## 2018-12-31 ENCOUNTER — Other Ambulatory Visit: Payer: Self-pay

## 2018-12-31 ENCOUNTER — Encounter: Payer: Self-pay | Admitting: Family Medicine

## 2018-12-31 ENCOUNTER — Ambulatory Visit (INDEPENDENT_AMBULATORY_CARE_PROVIDER_SITE_OTHER): Payer: Medicare Other | Admitting: Family Medicine

## 2018-12-31 DIAGNOSIS — E7800 Pure hypercholesterolemia, unspecified: Secondary | ICD-10-CM

## 2018-12-31 DIAGNOSIS — I10 Essential (primary) hypertension: Secondary | ICD-10-CM | POA: Diagnosis not present

## 2018-12-31 DIAGNOSIS — G620 Drug-induced polyneuropathy: Secondary | ICD-10-CM | POA: Diagnosis not present

## 2018-12-31 DIAGNOSIS — E1169 Type 2 diabetes mellitus with other specified complication: Secondary | ICD-10-CM

## 2018-12-31 DIAGNOSIS — T451X5A Adverse effect of antineoplastic and immunosuppressive drugs, initial encounter: Secondary | ICD-10-CM

## 2018-12-31 DIAGNOSIS — E119 Type 2 diabetes mellitus without complications: Secondary | ICD-10-CM | POA: Diagnosis not present

## 2018-12-31 MED ORDER — METFORMIN HCL 500 MG PO TABS: 500.0000 mg | ORAL_TABLET | Freq: Two times a day (BID) | ORAL | 3 refills | Status: DC

## 2018-12-31 NOTE — Assessment & Plan Note (Signed)
Discussed poor control on 500 mg a day and better control on 500 twice daily will give prescription for twice daily and take faithfully at twice a day

## 2018-12-31 NOTE — Assessment & Plan Note (Signed)
The current medical regimen is effective;  continue present plan and medications.  

## 2018-12-31 NOTE — Progress Notes (Addendum)
There were no vitals taken for this visit.   Subjective:    Patient ID: [name], female    DOB: [birth year], 68 y.o.   MRN: [id number]  HPI: [name] is a 68 y.o. female  DM update Med check  Telemedicine using audio/video telecommunications for a synchronous communication visit. Today's visit due to COVID-19 isolation precautions I connected with and verified that I am speaking with the correct person using two identifiers.   I discussed the limitations, risks, security and privacy concerns of performing an evaluation and management service by telecommunication and the availability of in person appointments. I also discussed with the patient that there may be a patient responsible charge related to this service. The patient expressed understanding and agreed to proceed. The patient's location is home. I am at home.  Discussed with patient diabetes with elevated blood sugars morning and evening diet taking metformin 500 mg each morning by adding a second metformin patient's diabetes is doing much better.  Reviewed with patient and will continue 500 twice a day prescription for metformin. Blood pressure doing well with no problems good blood pressure control and good fluid control. Lyrica working well for chemotherapy-induced myopathy. Cholesterol doing well with Lipitor no complaints.  Relevant past medical, surgical, family and social history reviewed and updated as indicated. Interim medical history since our last visit reviewed. Allergies and medications reviewed and updated.  Review of Systems  Constitutional: Negative.   Respiratory: Negative.   Cardiovascular: Negative.     Per HPI unless specifically indicated above     Objective:    There were no vitals taken for this visit.  Wt Readings from Last 3 Encounters:  09/21/18 224 lb 3.2 oz (101.7 kg)  06/29/18 225 lb 8 oz (102.3 kg)  06/22/18 225 lb (102.1 kg)    Physical Exam  Results for orders placed or  performed in visit on 11/02/18  Magnesium  Result Value Ref Range   Magnesium 1.6 (L) 1.7 - 2.4 mg/dL      Assessment & Plan:   Problem List Items Addressed This Visit      Cardiovascular and Mediastinum   Essential hypertension    The current medical regimen is effective;  continue present plan and medications.         Endocrine   Diabetes mellitus associated with hormonal etiology ([place])    Discussed poor control on 500 mg a day and better control on 500 twice daily will give prescription for twice daily and take faithfully at twice a day      Relevant Medications   metFORMIN (GLUCOPHAGE) 500 MG tablet     Nervous and Auditory   Chemotherapy-induced neuropathy (HCC)    The current medical regimen is effective;  continue present plan and medications.         Other   Hypercholesteremia    The current medical regimen is effective;  continue present plan and medications.           I discussed the assessment and treatment plan with the patient. The patient was provided an opportunity to ask questions and all were answered. The patient agreed with the plan and demonstrated an understanding of the instructions.   The patient was advised to call back or seek an in-person evaluation if the symptoms worsen or if the condition fails to improve as anticipated.   I provided 21+ minutes of time during this encounter.  Follow up plan: Return in about 2 months (around 03/02/2019)  for As scheduled, Hemoglobin A1c, BMP,  Lipids, ALT, AST.

## 2019-01-11 ENCOUNTER — Ambulatory Visit
Admission: RE | Admit: 2019-01-11 | Discharge: 2019-01-11 | Disposition: A | Discharge status: Home / Self Care | Payer: Medicare Other | Source: Ambulatory Visit | Attending: Oncology | Admitting: Oncology

## 2019-01-11 ENCOUNTER — Other Ambulatory Visit: Payer: Self-pay

## 2019-01-11 DIAGNOSIS — Z1231 Encounter for screening mammogram for malignant neoplasm of breast: Secondary | ICD-10-CM | POA: Diagnosis present

## 2019-01-11 DIAGNOSIS — C50912 Malignant neoplasm of unspecified site of left female breast: Secondary | ICD-10-CM | POA: Insufficient documentation

## 2019-01-22 LAB — HM DIABETES EYE EXAM

## 2019-01-25 HISTORY — PX: EYE SURGERY: SHX253

## 2019-03-01 ENCOUNTER — Inpatient Hospital Stay: Payer: Medicare Other

## 2019-03-01 ENCOUNTER — Inpatient Hospital Stay: Payer: Medicare Other | Attending: Oncology

## 2019-03-01 ENCOUNTER — Other Ambulatory Visit: Payer: Self-pay

## 2019-03-01 LAB — MAGNESIUM: Magnesium: 1.4 mg/dL — ABNORMAL LOW (ref 1.7–2.4)

## 2019-03-01 MED ORDER — MAGNESIUM SULFATE 2 GM/50ML IV SOLN
2.0000 g | Freq: Once | INTRAVENOUS | Status: AC
  Administered 2019-03-01: 2 g via INTRAVENOUS
  Filled 2019-03-01: qty 50

## 2019-03-01 MED ORDER — SODIUM CHLORIDE 0.9 % IV SOLN
INTRAVENOUS | Status: DC
  Administered 2019-03-01: 14:00:00 via INTRAVENOUS
  Filled 2019-03-01: qty 250

## 2019-03-16 ENCOUNTER — Other Ambulatory Visit: Payer: Self-pay | Admitting: Family Medicine

## 2019-03-16 DIAGNOSIS — G620 Drug-induced polyneuropathy: Secondary | ICD-10-CM

## 2019-03-16 NOTE — Telephone Encounter (Signed)
Patient last seen 12/31/18 and has appointment 03/22/19.

## 2019-03-16 NOTE — Telephone Encounter (Signed)
Requested medications are due for refill today?  Yes  Requested medications are on the active medication list?  Yes  Last refill  09/21/18   Future visit scheduled?  Yes  Notes to clinic [facility] has follow up scheduled for 03/22/2019.   Requested Prescriptions  Pending Prescriptions Disp Refills   pregabalin (LYRICA) 50 MG capsule [Pharmacy Med Name: PREGABALIN 50 MG CAPSULE] 270 capsule 0    Sig: Take 1 capsule (50 mg total) by mouth 3 (three) times daily.     Not Delegated - Neurology:  Anticonvulsants - Controlled Failed - 03/16/2019 10:01 AM      Failed - This refill cannot be delegated      Passed - Valid encounter within last 12 months    Recent Outpatient Visits          2 months ago Type 2 diabetes mellitus without complication, without long-term current use of insulin ([place])   [place], Jeannette How, MD   5 months ago Chemotherapy-induced neuropathy [facility])   [facility], Jeannette How, MD   8 months ago [place], Jeannette How, MD   1 year ago Essential hypertension   [facility] Crissman, Jeannette How, MD   1 year ago Essential hypertension   [place], Jeannette How, MD      Future Appointments            In 6 days [place], Jeannette How, MD [name]   In 1 week  [facility], [place]

## 2019-03-22 ENCOUNTER — Encounter: Payer: Self-pay | Admitting: Family Medicine

## 2019-03-22 ENCOUNTER — Other Ambulatory Visit: Payer: Self-pay

## 2019-03-22 ENCOUNTER — Ambulatory Visit: Payer: Medicare Other | Admitting: Family Medicine

## 2019-03-22 ENCOUNTER — Ambulatory Visit (INDEPENDENT_AMBULATORY_CARE_PROVIDER_SITE_OTHER): Payer: Medicare Other | Admitting: Family Medicine

## 2019-03-22 DIAGNOSIS — G620 Drug-induced polyneuropathy: Secondary | ICD-10-CM | POA: Diagnosis not present

## 2019-03-22 DIAGNOSIS — T451X5A Adverse effect of antineoplastic and immunosuppressive drugs, initial encounter: Secondary | ICD-10-CM

## 2019-03-22 DIAGNOSIS — M65331 Trigger finger, right middle finger: Secondary | ICD-10-CM | POA: Insufficient documentation

## 2019-03-22 DIAGNOSIS — I10 Essential (primary) hypertension: Secondary | ICD-10-CM

## 2019-03-22 DIAGNOSIS — E1169 Type 2 diabetes mellitus with other specified complication: Secondary | ICD-10-CM

## 2019-03-22 DIAGNOSIS — M65341 Trigger finger, right ring finger: Secondary | ICD-10-CM | POA: Insufficient documentation

## 2019-03-22 DIAGNOSIS — E7800 Pure hypercholesterolemia, unspecified: Secondary | ICD-10-CM

## 2019-03-22 MED ORDER — PREGABALIN 50 MG PO CAPS: 50.0000 mg | ORAL_CAPSULE | Freq: Three times a day (TID) | ORAL | 1 refills | Status: DC

## 2019-03-22 NOTE — Assessment & Plan Note (Signed)
The current medical regimen is effective;  continue present plan and medications.  

## 2019-03-22 NOTE — Progress Notes (Addendum)
There were no vitals taken for this visit.   Subjective:    Patient ID: [name], female    DOB: [birth year], 68 y.o.   MRN: [id number]  HPI: [name] is a 68 y.o. female  Med check Patient follow-up primary concern is trigger finger which is getting worse bothering her at night and can barely straighten her finger at this point ready to have a consult. No issues with diabetes hypertension or cholesterol doing well. Taking medications faithfully without problems  Relevant past medical, surgical, family and social history reviewed and updated as indicated. Interim medical history since our last visit reviewed. Allergies and medications reviewed and updated.  Review of Systems  Constitutional: Negative.   Respiratory: Negative.   Cardiovascular: Negative.     Per HPI unless specifically indicated above     Objective:    There were no vitals taken for this visit.  Wt Readings from Last 3 Encounters:  09/21/18 224 lb 3.2 oz (101.7 kg)  06/29/18 225 lb 8 oz (102.3 kg)  06/22/18 225 lb (102.1 kg)    Physical Exam  Results for orders placed or performed in visit on 03/01/19  Magnesium  Result Value Ref Range   Magnesium 1.4 (L) 1.7 - 2.4 mg/dL      Assessment & Plan:   Problem List Items Addressed This Visit      Cardiovascular and Mediastinum   Essential hypertension    The current medical regimen is effective;  continue present plan and medications.       Relevant Orders   Basic metabolic panel     Endocrine   Diabetes mellitus associated with hormonal etiology ([place])    The current medical regimen is effective;  continue present plan and medications.       Relevant Orders   Bayer DCA Hb A1c Waived   Basic metabolic panel     Nervous and Auditory   Chemotherapy-induced neuropathy (HCC)   Relevant Medications   pregabalin (LYRICA) 50 MG capsule     Musculoskeletal and Integument   Trigger finger, right middle finger    With worsening trigger  finger will refer to orthopedics to further evaluate      Relevant Orders   Ambulatory referral to Orthopedic Surgery     Other   Hypercholesteremia    The current medical regimen is effective;  continue present plan and medications.       Relevant Orders   LP+ALT+AST [place], [employer]     Telemedicine using audio/video telecommunications for a synchronous communication visit. Today's visit due to COVID-19 isolation precautions I connected with and verified that I am speaking with the correct person using two identifiers.   I discussed the limitations, risks, security and privacy concerns of performing an evaluation and management service by telecommunication and the availability of in person appointments. I also discussed with the patient that there may be a patient responsible charge related to this service. The patient expressed understanding and agreed to proceed. The patient's location is home. I am at home.   I discussed the assessment and treatment plan with the patient. The patient was provided an opportunity to ask questions and all were answered. The patient agreed with the plan and demonstrated an understanding of the instructions.   The patient was advised to call back or seek an in-person evaluation if the symptoms worsen or if the condition fails to improve as anticipated.   I provided 21+ minutes of time during this encounter.  Follow up plan: Return in about 6 months (around 09/22/2019) for Hemoglobin A1c.

## 2019-03-22 NOTE — Assessment & Plan Note (Signed)
With worsening trigger finger will refer to orthopedics to further evaluate

## 2019-03-29 ENCOUNTER — Other Ambulatory Visit: Payer: Medicare Other

## 2019-03-29 ENCOUNTER — Ambulatory Visit (INDEPENDENT_AMBULATORY_CARE_PROVIDER_SITE_OTHER): Payer: Medicare Other

## 2019-03-29 ENCOUNTER — Other Ambulatory Visit: Payer: Self-pay

## 2019-03-29 VITALS — BP 128/72 | HR 71 | Temp 97.7°F | Resp 16 | Ht 64.5 in | Wt 225.0 lb

## 2019-03-29 DIAGNOSIS — Z0000 Encounter for general adult medical examination without abnormal findings: Secondary | ICD-10-CM | POA: Diagnosis not present

## 2019-03-29 DIAGNOSIS — E1169 Type 2 diabetes mellitus with other specified complication: Secondary | ICD-10-CM

## 2019-03-29 DIAGNOSIS — I10 Essential (primary) hypertension: Secondary | ICD-10-CM

## 2019-03-29 DIAGNOSIS — E7800 Pure hypercholesterolemia, unspecified: Secondary | ICD-10-CM

## 2019-03-29 LAB — LP+ALT+AST PICCOLO, WAIVED
ALT (SGPT) Piccolo, Waived: 32 U/L (ref 10–47)
AST (SGOT) Piccolo, Waived: 30 U/L (ref 11–38)
Chol/HDL Ratio Piccolo,Waive: 2.8 mg/dL
Cholesterol Piccolo, Waived: 145 mg/dL (ref <200)
HDL Chol Piccolo, Waived: 51 mg/dL — ABNORMAL LOW (ref >59)
LDL Chol Calc Piccolo Waived: 69 mg/dL (ref <100)
Triglycerides Piccolo,Waived: 124 mg/dL (ref <150)
VLDL Chol Calc Piccolo,Waive: 25 mg/dL (ref <30)

## 2019-03-29 LAB — BAYER DCA HB A1C WAIVED: HB A1C (BAYER DCA - WAIVED): 6 % (ref <7.0)

## 2019-03-29 LAB — MICROALBUMIN, URINE WAIVED
Creatinine, Urine Waived: 100 mg/dL (ref 10–300)
Microalb, Ur Waived: 30 mg/L — ABNORMAL HIGH (ref 0–19)
Microalb/Creat Ratio: <30 mg/g (ref <30)

## 2019-03-29 NOTE — Addendum Note (Signed)
Addended by: Tyler Aas A on: 03/29/2019 09:47 AM   Modules accepted: Orders

## 2019-03-29 NOTE — Patient Instructions (Signed)
Ms. [name] , Thank you for taking time to come for your Medicare Wellness Visit. I appreciate your ongoing commitment to your health goals. Please review the following plan we discussed and let me know if I can assist you in the future.   Screening recommendations/referrals: Colonoscopy: cologuard completed 2018 Mammogram: up to date Bone Density: up to date Recommended yearly ophthalmology/optometry visit for glaucoma screening and checkup Recommended yearly dental visit for hygiene and checkup  Vaccinations: Influenza vaccine: up to date Pneumococcal vaccine: up to date Tdap vaccine: up to date Shingles vaccine: shingrix eligible, check with your insurance company for coverage     Advanced directives: Please bring a copy of your health care power of attorney and living will to the office at your convenience.   Conditions/risks identified: diabetic, discussed chronic care management team   Next appointment: Follow up in one year for your annual wellness visit    Preventive Care 65 Years and Older, Female Preventive care refers to lifestyle choices and visits with your health care provider that can promote health and wellness. What does preventive care include?  A yearly physical exam. This is also called an annual well check.  Dental exams once or twice a year.  Routine eye exams. Ask your health care provider how often you should have your eyes checked.  Personal lifestyle choices, including:  Daily care of your teeth and gums.  Regular physical activity.  Eating a healthy diet.  Avoiding tobacco and drug use.  Limiting alcohol use.  Practicing safe sex.  Taking low-dose aspirin every day.  Taking vitamin and mineral supplements as recommended by your health care provider. What happens during an annual well check? The services and screenings done by your health care provider during your annual well check will depend on your age, overall health, lifestyle risk  factors, and family history of disease. Counseling  Your health care provider may ask you questions about your:  Alcohol use.  Tobacco use.  Drug use.  Emotional well-being.  Home and relationship well-being.  Sexual activity.  Eating habits.  History of falls.  Memory and ability to understand (cognition).  Work and work Statistician.  Reproductive health. Screening  You may have the following tests or measurements:  Height, weight, and BMI.  Blood pressure.  Lipid and cholesterol levels. These may be checked every 5 years, or more frequently if you are over 24 years old.  Skin check.  Lung cancer screening. You may have this screening every year starting at age 47 if you have a 30-pack-year history of smoking and currently smoke or have quit within the past 15 years.  Fecal occult blood test (FOBT) of the stool. You may have this test every year starting at age 69.  Flexible sigmoidoscopy or colonoscopy. You may have a sigmoidoscopy every 5 years or a colonoscopy every 10 years starting at age 54.  Hepatitis C blood test.  Hepatitis B blood test.  Sexually transmitted disease (STD) testing.  Diabetes screening. This is done by checking your blood sugar (glucose) after you have not eaten for a while (fasting). You may have this done every 1-3 years.  Bone density scan. This is done to screen for osteoporosis. You may have this done starting at age 81.  Mammogram. This may be done every 1-2 years. Talk to your health care provider about how often you should have regular mammograms. Talk with your health care provider about your test results, treatment options, and if necessary, the need for  more tests. Vaccines  Your health care provider may recommend certain vaccines, such as:  Influenza vaccine. This is recommended every year.  Tetanus, diphtheria, and acellular pertussis (Tdap, Td) vaccine. You may need a Td booster every 10 years.  Zoster vaccine. You  may need this after age 36.  Pneumococcal 13-valent conjugate (PCV13) vaccine. One dose is recommended after age 16.  Pneumococcal polysaccharide (PPSV23) vaccine. One dose is recommended after age 64. Talk to your health care provider about which screenings and vaccines you need and how often you need them. This information is not intended to replace advice given to you by your health care provider. Make sure you discuss any questions you have with your health care provider. Document Released: 09/08/2015 Document Revised: 05/01/2016 Document Reviewed: 06/13/2015 Elsevier Interactive Patient Education  2017 [place] Prevention in the Home Falls can cause injuries. They can happen to people of all ages. There are many things you can do to make your home safe and to help prevent falls. What can I do on the outside of my home?  Regularly fix the edges of walkways and driveways and fix any cracks.  Remove anything that might make you trip as you walk through a door, such as a raised step or threshold.  Trim any bushes or trees on the path to your home.  Use bright outdoor lighting.  Clear any walking paths of anything that might make someone trip, such as rocks or tools.  Regularly check to see if handrails are loose or broken. Make sure that both sides of any steps have handrails.  Any raised decks and porches should have guardrails on the edges.  Have any leaves, snow, or ice cleared regularly.  Use sand or salt on walking paths during winter.  Clean up any spills in your garage right away. This includes oil or grease spills. What can I do in the bathroom?  Use night lights.  Install grab bars by the toilet and in the tub and shower. Do not use towel bars as grab bars.  Use non-skid mats or decals in the tub or shower.  If you need to sit down in the shower, use a plastic, non-slip stool.  Keep the floor dry. Clean up any water that spills on the floor as soon as  it happens.  Remove soap buildup in the tub or shower regularly.  Attach bath mats securely with double-sided non-slip rug tape.  Do not have throw rugs and other things on the floor that can make you trip. What can I do in the bedroom?  Use night lights.  Make sure that you have a light by your bed that is easy to reach.  Do not use any sheets or blankets that are too big for your bed. They should not hang down onto the floor.  Have a firm chair that has side arms. You can use this for support while you get dressed.  Do not have throw rugs and other things on the floor that can make you trip. What can I do in the kitchen?  Clean up any spills right away.  Avoid walking on wet floors.  Keep items that you use a lot in easy-to-reach places.  If you need to reach something above you, use a strong step stool that has a grab bar.  Keep electrical cords out of the way.  Do not use floor polish or wax that makes floors slippery. If you must use wax, use non-skid  floor wax.  Do not have throw rugs and other things on the floor that can make you trip. What can I do with my stairs?  Do not leave any items on the stairs.  Make sure that there are handrails on both sides of the stairs and use them. Fix handrails that are broken or loose. Make sure that handrails are as long as the stairways.  Check any carpeting to make sure that it is firmly attached to the stairs. Fix any carpet that is loose or worn.  Avoid having throw rugs at the top or bottom of the stairs. If you do have throw rugs, attach them to the floor with carpet tape.  Make sure that you have a light switch at the top of the stairs and the bottom of the stairs. If you do not have them, ask someone to add them for you. What else can I do to help prevent falls?  Wear shoes that:  Do not have high heels.  Have rubber bottoms.  Are comfortable and fit you well.  Are closed at the toe. Do not wear sandals.  If  you use a stepladder:  Make sure that it is fully opened. Do not climb a closed stepladder.  Make sure that both sides of the stepladder are locked into place.  Ask someone to hold it for you, if possible.  Clearly mark and make sure that you can see:  Any grab bars or handrails.  First and last steps.  Where the edge of each step is.  Use tools that help you move around (mobility aids) if they are needed. These include:  Canes.  Walkers.  Scooters.  Crutches.  Turn on the lights when you go into a dark area. Replace any light bulbs as soon as they burn out.  Set up your furniture so you have a clear path. Avoid moving your furniture around.  If any of your floors are uneven, fix them.  If there are any pets around you, be aware of where they are.  Review your medicines with your doctor. Some medicines can make you feel dizzy. This can increase your chance of falling. Ask your doctor what other things that you can do to help prevent falls. This information is not intended to replace advice given to you by your health care provider. Make sure you discuss any questions you have with your health care provider. Document Released: 06/08/2009 Document Revised: 01/18/2016 Document Reviewed: 09/16/2014 Elsevier Interactive Patient Education  2017 [employer].

## 2019-03-29 NOTE — Progress Notes (Signed)
Subjective:   [name] is a 68 y.o. female who presents for Medicare Annual (Subsequent) preventive examination.  Review of Systems:   Cardiac Risk Factors include: advanced age (>18mn, >>65women);hypertension;diabetes mellitus;dyslipidemia;obesity (BMI >30kg/m2)     Objective:     Vitals: BP 128/72 (BP Location: Left Arm, Patient Position: Sitting, Cuff Size: Normal)   Pulse 71   Temp 97.7 F (36.5 C) (Temporal)   Resp 16   Ht 5' 4.5" (1.638 m)   Wt 225 lb (102.1 kg)   SpO2 97%   BMI 38.02 kg/m   Body mass index is 38.02 kg/m.  Advanced Directives 06/29/2018 03/16/2018 06/23/2017 06/24/2016  Does Patient Have a Medical Advance Directive? No No No No  Would patient like information on creating a medical advance directive? - Yes (MAU/Ambulatory/Procedural Areas - Information given) - No - patient declined information    Tobacco Social History   Tobacco Use  Smoking Status Former Smoker  . Quit date: 08/26/1981  . Years since quitting: 37.6  Smokeless Tobacco Never Used     Counseling given: Not Answered   Clinical Intake:  Pre-visit preparation completed: Yes  Pain : 0-10 Pain Score: 6  Pain Type: Acute pain Pain Location: Finger (Comment which one)(middle, right hand) Pain Orientation: Right Pain Descriptors / Indicators: Aching, Throbbing, Shooting Pain Onset: More than a month ago Pain Frequency: Constant Pain Relieving Factors: arthritis cream, deep blue  Pain Relieving Factors: arthritis cream, deep blue  Nutritional Status: BMI > 30  Obese Nutritional Risks: None Diabetes: Yes CBG done?: No Did pt. bring in CBG monitor from home?: No    Nutrition Risk Assessment:  Has the patient had any N/V/D within the last 2 months?  No  Does the patient have any non-healing wounds?  No  Has the patient had any unintentional weight loss or weight gain?  No   Diabetes:  Is the patient diabetic?  Yes  If diabetic, was a CBG obtained today?  No  Did  the patient bring in their glucometer from home?  No  How often do you monitor your CBG's? Daily .   Financial Strains and Diabetes Management:  Are you having any financial strains with the device, your supplies or your medication? No .  Does the patient want to be seen by Chronic Care Management for management of their diabetes?  No  Would the patient like to be referred to a Nutritionist or for Diabetic Management?  No   Diabetic Exams:  Diabetic Eye Exam: Completed 01/22/2019. Overdue for diabetic eye exam. Pt has been advised about the importance in completing this exam. A referral has been placed today. Message sent to referral coordinator for scheduling purposes. Advised pt to expect a call from our office re: appt.  Diabetic Foot Exam: Completed 06/22/2018   How often do you need to have someone help you when you read instructions, pamphlets, or other written materials from your doctor or pharmacy?: 1 - Never What is the last grade level you completed in school?: bachelors  Interpreter Needed?: No  Information entered by :: Andra Matsuo,LPN  Past Medical History:  Diagnosis Date  . [place]    Chemo/XRT/lumpectomy  . Breast cancer ([facility] 09/2007   left breast, radiation, chemo  . Cardiomyopathy secondary   . Hypersomnolence 09/21/2013  . Leg edema, left 09/21/2013  . 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 Pacemaker   . Personal history of chemotherapy    2009  .  Personal history of radiation therapy 2009  . Sinus tachycardia   . Type 2 diabetes mellitus ([place]) 06/19/2015   Past Surgical History:  Procedure Laterality Date  . 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 BREAST LUMPECTOMY    . CESAREAN SECTION  1983  . EYE SURGERY Left 12/2018   cataracts-Dr.Shah in [place]   . EYE SURGERY Right 01/2019   cataracts- Dr.Shah in [place]  . [place]  . INSERT / REPLACE / REMOVE PACEMAKER  2011   revision @ [facility]  05/13/2003   [facility] Comp - DR   Family History  Problem Relation Age of Onset  . Diabetes Mother   . Heart disease Mother   . Thyroid disease Mother   . Hypertension Father   . Cancer Father   . Alcohol abuse Father   . Diabetes Father   . Stroke Other        Family hx of CVA or stroke  . Diabetes Brother   . Diabetes Daughter   . Diabetes Paternal Uncle   . Diabetes Paternal Grandmother   . Breast cancer Maternal Aunt    Social History   Socioeconomic History  . Marital status: Divorced    Spouse name: Not on file  . Number of children: Not on file  . Years of education: Not on file  . Highest education level: Bachelor's degree (e.g., BA, AB, BS)  Occupational History  . Occupation: Full time    Employer: [employer]  . Financial resource strain: Not hard at all  . Food insecurity    Worry: Never true    Inability: Never true  . Transportation needs    Medical: No    Non-medical: No  Tobacco Use  . Smoking status: Former Smoker    Quit date: 08/26/1981    Years since quitting: 37.6  . Smokeless tobacco: Never Used  Substance and Sexual Activity  . Alcohol use: Not Currently  . Drug use: No  . Sexual activity: Not on file  Lifestyle  . Physical activity    Days per week: 0 days    Minutes per session: 0 min  . Stress: Not at all  Relationships  . Social connections    Talks on phone: More than three times a week    Gets together: More than three times a week    Attends religious service: More than 4 times per year    Active member of club or organization: Yes    Attends meetings of clubs or organizations: More than 4 times per year    Relationship status: Divorced  Other Topics Concern  . Not on file  Social History Narrative   Divorced   Gets regular exercise    Outpatient Encounter Medications as of 03/29/2019   Medication Sig  . aMILoride (MIDAMOR) 5 MG tablet Take 1 tablet (5 mg total) by mouth daily.  Marland Kitchen atenolol (TENORMIN) 50 MG tablet Take 1 tablet (50 mg total) by mouth daily.  Marland Kitchen atorvastatin (LIPITOR) 40 MG tablet Take 1 tablet (40 mg total) by mouth daily.  . cetirizine (ZYRTEC) 5 MG tablet Take 5 mg by mouth as needed for allergies. Seasonal allergies  . chlorpheniramine (CHLOR-TRIMETON) 4 MG tablet Take 4 mg by mouth every 6 (six) hours as needed for allergies.  Marland Kitchen CINNAMON PO Take by mouth daily.  Marland Kitchen  fluticasone (FLONASE) 50 MCG/ACT nasal spray Place 2 sprays into both nostrils daily.  Marland Kitchen glucose blood (ACCU-CHEK AVIVA PLUS) test strip TEST BLOOD SUGAR ONCE DAILY  . magnesium oxide (MAG-OX) 400 MG tablet Take 400 mg by mouth 2 (two) times daily.  . metFORMIN (GLUCOPHAGE) 500 MG tablet Take 1 tablet (500 mg total) by mouth 2 (two) times daily with a meal.  . naproxen sodium (ANAPROX) 220 MG tablet Take 220 mg by mouth 2 (two) times daily with a meal.  . pregabalin (LYRICA) 50 MG capsule Take 1 capsule (50 mg total) by mouth 3 (three) times daily.  . SUMAtriptan (IMITREX) 100 MG tablet Take 1 tablet (100 mg total) by mouth as needed. (Patient not taking: Reported on 03/29/2019)   No facility-administered encounter medications on file as of 03/29/2019.     Activities of Daily Living In your present state of health, do you have any difficulty performing the following activities: 03/29/2019 09/21/2018  Hearing? N N  Comment no hearing aids -  Vision? N Y  Comment wears reading glasses -  Difficulty concentrating or making decisions? Y N  Comment since chemo -  Walking or climbing stairs? Y Y  Comment some due to ankle -  Dressing or bathing? N N  Doing errands, shopping? N N  Preparing Food and eating ? N -  Using the Toilet? N -  In the past six months, have you accidently leaked urine? Y -  Comment wears panty liner for preventative -  Do you have problems with loss of bowel control? N -   Managing your Medications? N -  Managing your Finances? N -  Housekeeping or managing your Housekeeping? N -  Some recent data might be hidden    Patient Care Team: Guadalupe Maple, MD as PCP - General (Family Medicine) Lloyd Huger, MD as Consulting Physician (Oncology) Deboraha Sprang, MD as Consulting Physician (Cardiology)    Assessment:   This is a routine wellness examination for Kaiesha.  Exercise Activities and Dietary recommendations Current Exercise Habits: The patient does not participate in regular exercise at present, Exercise limited by: None identified  Goals    . DIET - INCREASE WATER INTAKE     Recommend drinking at least 6-8 glasses of water a day        Fall Risk: Fall Risk  03/29/2019 06/22/2018 03/16/2018 03/16/2018 09/09/2017  Falls in the past year? 0 No No No No  Number falls in past yr: - - - - -  Injury with Fall? - - - - -    Jane Lew:  Any stairs in or around the home? Yes  If so, are there any without handrails? No   Home free of loose throw rugs in walkways, pet beds, electrical cords, etc? Yes  Adequate lighting in your home to reduce risk of falls? Yes   ASSISTIVE DEVICES UTILIZED TO PREVENT FALLS:  Life alert? No  Use of a cane, walker or w/c? No  Grab bars in the bathroom? No  Shower chair or bench in shower? No  Elevated toilet seat or a handicapped toilet? Yes  elevated toliet   DME ORDERS:  DME order needed?  No   TIMED UP AND GO:  Was the test performed? Yes .  Length of time to ambulate 10 feet: 10 sec.   GAIT:  Appearance of gait: Gait steady and fast without the use of an assistive device.  Education: Fall risk  prevention has been discussed.  Intervention(s) required? No   DME/home health order needed?  No    Depression Screen PHQ 2/9 Scores 03/29/2019 03/16/2018 09/09/2017 02/24/2017  PHQ - 2 Score 0 0 0 0  PHQ- 9 Score - 2 - -     Cognitive Function     6CIT Screen  03/16/2018  What Year? 0 points  What month? 0 points  What time? 0 points  Count back from 20 0 points  Months in reverse 0 points  Repeat phrase 0 points  Total Score 0    Immunization History  Administered Date(s) Administered  . Influenza, High Dose Seasonal PF 06/22/2018  . Influenza,inj,Quad PF,6+ Mos 06/19/2015, 06/24/2016, 06/23/2017  . Pneumococcal Conjugate-13 04/01/2016  . Pneumococcal Polysaccharide-23 03/16/2018  . Tdap 05/22/2011  . Zoster 06/28/2014    Qualifies for Shingles Vaccine? Yes  Zostavax completed 06/28/2014. Due for Shingrix. Education has been provided regarding the importance of this vaccine. Pt has been advised to call insurance company to determine out of pocket expense. Advised may also receive vaccine at local pharmacy or Health Dept. Verbalized acceptance and understanding.  Tdap: up to date   Flu Vaccine: up to date   Pneumococcal Vaccine: up to date   Screening Tests Health Maintenance  Topic Date Due  . URINE MICROALBUMIN  03/17/2019  . HEMOGLOBIN A1C  03/22/2019  . INFLUENZA VACCINE  03/27/2019  . FOOT EXAM  06/23/2019  . Fecal DNA (Cologuard)  09/01/2019  . OPHTHALMOLOGY EXAM  01/22/2020  . MAMMOGRAM  01/10/2021  . TETANUS/TDAP  05/21/2021  . DEXA SCAN  Completed  . Hepatitis C Screening  Completed  . PNA vac Low Risk Adult  Completed    Cancer Screenings:  Colorectal Screening: Completed cologuard 08/31/2016  Mammogram: Completed 01/11/2019  Bone Density: Completed 08/16/2014  Lung Cancer Screening: (Low Dose CT Chest recommended if Age 30-80 years, 30 pack-year currently smoking OR have quit w/in 15years.) does not qualify.    Additional Screening:  Hepatitis C Screening: does qualify; Completed 03/16/2018  Vision Screening: Recommended annual ophthalmology exams for early detection of glaucoma and other disorders of the eye. Is the patient up to date with their annual eye exam?  Yes  Who is the provider or what is the  name of the office in which the pt attends annual eye exams? My [facility] dr.   [facility] Screening: Recommended annual dental exams for proper oral hygiene  Community Resource Referral:  CRR required this visit?  No       Plan:  I have personally reviewed and addressed the Medicare Annual Wellness questionnaire and have noted the following in the patient's chart:  A. Medical and social history B. Use of alcohol, tobacco or illicit drugs  C. Current medications and supplements D. Functional ability and status E.  Nutritional status F.  Physical activity G. Advance directives H. List of other physicians I.  Hospitalizations, surgeries, and ER visits in previous 12 months J.  [place] such as hearing and vision if needed, cognitive and depression L. Referrals and appointments   In addition, I have reviewed and discussed with patient certain preventive protocols, quality metrics, and best practice recommendations. A written personalized care plan for preventive services as well as general preventive health recommendations were provided to patient.    Signed,    Bevelyn Ngo, LPN  11/30/4257 Nurse Health Advisor   Nurse Notes: patient needs new glucose monitoring system. Hers is 31 or 68 years old and  keeps giving different readings with the same sample.  Patient complaining of hot flashes, night sweats again, states she started having them a year ago and it has gradually increased in the last 3-4 months.

## 2019-03-30 LAB — BASIC METABOLIC PANEL
BUN/Creatinine Ratio: 22 (ref 12–28)
BUN: 18 mg/dL (ref 8–27)
CO2: 24 mmol/L (ref 20–29)
Calcium: 9.5 mg/dL (ref 8.7–10.3)
Chloride: 100 mmol/L (ref 96–106)
Creatinine, Ser: 0.82 mg/dL (ref 0.57–1.00)
GFR calc Af Amer: 84 mL/min/{1.73_m2} (ref >59)
GFR calc non Af Amer: 73 mL/min/{1.73_m2} (ref >59)
Glucose: 116 mg/dL — ABNORMAL HIGH (ref 65–99)
Potassium: 4.5 mmol/L (ref 3.5–5.2)
Sodium: 143 mmol/L (ref 134–144)

## 2019-04-05 ENCOUNTER — Encounter: Payer: Self-pay | Admitting: Family Medicine

## 2019-04-26 ENCOUNTER — Encounter: Payer: Self-pay | Admitting: Internal Medicine

## 2019-04-26 NOTE — Progress Notes (Signed)
Man    Electrophysiology Office Note   Date:  04/27/2019   ID:  Kelsey, [name] 13-Apr-1951, MRN [id number]  PCP:  Guadalupe Maple, MD  Cardiologist:  none Primary Electrophysiologist:    Virl Axe, MD    Chief Complaint  Patient presents with  . o ther    12 mo F/U. Medications reviewed verbally.     History of Present Illness: [name] is a 68 y.o. 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  She underwent generator replacement Dec 2011   Hx of breast CA 2000's  Echo 06/2010 normal LV function   The patient denies chest pain, shortness of breath, nocturnal dyspnea, orthopnea or peripheral edema.  There have been no palpitations, lightheadedness or syncope.    Last remote and office visit 2018         Past Medical History:  Diagnosis Date  . Breast CA ([place])    Chemo/XRT/lumpectomy  . Breast cancer ([place]) 09/2007   left breast, radiation, chemo  . Cardiomyopathy secondary   . Hypersomnolence 09/21/2013  . Leg edema, left 09/21/2013  . 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 Pacemaker [employer]   . Sinus tachycardia   . Type 2 diabetes mellitus ([place]) 06/19/2015   Past Surgical History:  Procedure Laterality Date  . 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 BREAST LUMPECTOMY    . CESAREAN SECTION  1983  . EYE SURGERY Left 12/2018   cataracts-Dr.Shah in [place]   . EYE SURGERY Right 01/2019   cataracts- Dr.Shah in [place]  . [place]  . INSERT / REPLACE / REMOVE PACEMAKER  2011   revision @ [place]  05/13/2003   Guidant Rodman Comp - DR      Allergies:   Penicillins and Levofloxacin      PHYSICAL EXAM: VS:  BP 138/60 (BP Location: Left Arm, Patient Position: Sitting, Cuff Size: Normal)   Pulse 74    Ht 5' 4.5" (1.638 m)   Wt 228 lb (103.4 kg)   SpO2 98%   BMI 38.53 kg/m  , BMI Body mass index is 38.53 kg/m. Well developed and well nourished in no acute distress HENT normal Neck supple with JVP-flat Clear Device pocket well healed; without hematoma or erythema.  There is no tethering  Regular rate and rhythm, no  gallop murmur Abd-soft with active BS No Clubbing cyanosis  edema Skin-warm and dry A & Oriented  Grossly normal sensory and motor function  ECG sinus 77 23/08/39  Recent Labs: 09/21/2018: Hemoglobin 14.0; Platelets 191; TSH 3.120 03/01/2019: Magnesium 1.4 03/29/2019: ALT (SGPT) [place], Waived 32; BUN 18; Creatinine, Ser 0.82; Potassium 4.5; Sodium 143    Lipid Panel     Component Value Date/Time   CHOL 145 03/29/2019 0814   TRIG 124 03/29/2019 0814   HDL 48 09/21/2018 1417   CHOLHDL 3.0 09/21/2018 1417   VLDL 25 03/29/2019 0814   LDLCALC 71 09/21/2018 1417     Wt Readings from Last 3 Encounters:  04/27/19 228 lb (103.4 kg)  03/29/19 225 lb (102.1 kg)  09/21/18 224 lb 3.2 oz (101.7 kg)      Other studies Reviewed: Additional studies/ records that were reviewed today include: none   Review of the above records today  demonstrates: NA   ASSESSMENT AND PLAN: Dyspnea on Exertion/HFpEF  1 AV block--intermittent  Hypertension  Diabetes  Pacemaker [employer]  The patient's device was interrogated.  The information was reviewed. No changes were made in the programming.     Euvolemic continue current meds  30% V pacing or so, surprisingly well tolerated  BP well controlled        Signed, Virl Axe, MD  04/27/2019 10:02 AM     [facility] [address] [place] [address] [phone] (office) [phone] (fax)

## 2019-04-27 ENCOUNTER — Encounter

## 2019-04-27 ENCOUNTER — Encounter: Payer: Self-pay | Admitting: Internal Medicine

## 2019-04-27 ENCOUNTER — Other Ambulatory Visit: Payer: Self-pay

## 2019-04-27 ENCOUNTER — Ambulatory Visit (INDEPENDENT_AMBULATORY_CARE_PROVIDER_SITE_OTHER): Payer: Medicare Other | Admitting: Internal Medicine

## 2019-04-27 VITALS — BP 138/60 | HR 74 | Ht 64.5 in | Wt 228.0 lb

## 2019-04-27 DIAGNOSIS — Z950 Presence of cardiac pacemaker: Secondary | ICD-10-CM

## 2019-04-27 DIAGNOSIS — I440 Atrioventricular block, first degree: Secondary | ICD-10-CM | POA: Diagnosis not present

## 2019-04-27 NOTE — Patient Instructions (Signed)
Medication Instructions:  - Your physician recommends that you continue on your current medications as directed. Please refer to the Current Medication list given to you today.  If you need a refill on your cardiac medications before your next appointment, please call your pharmacy.   Lab work: - none ordered  If you have labs (blood work) drawn today and your tests are completely normal, you will receive your results only by: Marland Kitchen MyChart Message (if you have MyChart) OR . A paper copy in the mail If you have any lab test that is abnormal or we need to change your treatment, we will call you to review the results.  Testing/Procedures: - none ordered  Follow-Up: At [facility], you and your health needs are our priority.  As part of our continuing mission to provide you with exceptional heart care, we have created designated Provider Care Teams.  These Care Teams include your primary Cardiologist (physician) and Advanced Practice Providers (APPs -  Physician Assistants and Nurse Practitioners) who all work together to provide you with the care you need, when you need it. You will need a follow up appointment in 6 months (March 2021) with Dr. Caryl Comes. Marland Kitchen Please call our office 2 months in advance to schedule this appointment. (Call in early January 2021 to schedule)  Any Other Special Instructions Will Be Listed Below (If Applicable). - N/A

## 2019-04-28 NOTE — Addendum Note (Signed)
Addended by: Alba Destine on: 04/28/2019 10:23 AM   Modules accepted: Orders

## 2019-06-01 LAB — CUP PACEART INCLINIC DEVICE CHECK
Brady Statistic RA Percent Paced: 0 %
Brady Statistic RV Percent Paced: 26 %
Date Time Interrogation Session: 20200901040000
Implantable Lead Implant Date: 20040917
Implantable Lead Implant Date: 20040917
Implantable Lead Location: 753859
Implantable Lead Location: 753860
Implantable Lead Model: 4479
Implantable Lead Model: 5076
Implantable Lead Serial Number: 312732
Implantable Pulse Generator Implant Date: 20111209
Lead Channel Impedance Value: 460 Ohm
Lead Channel Impedance Value: 460 Ohm
Lead Channel Pacing Threshold Amplitude: 0.5 V
Lead Channel Pacing Threshold Amplitude: 1.2 V
Lead Channel Pacing Threshold Pulse Width: 0.4 ms
Lead Channel Pacing Threshold Pulse Width: 0.4 ms
Lead Channel Sensing Intrinsic Amplitude: 1.9 mV
Lead Channel Sensing Intrinsic Amplitude: 12 mV
Lead Channel Setting Pacing Amplitude: 2 V
Lead Channel Setting Pacing Amplitude: 2.4 V
Lead Channel Setting Pacing Pulse Width: 0.4 ms
Lead Channel Setting Sensing Sensitivity: 2.5 mV
Pulse Gen Serial Number: 595359

## 2019-06-14 ENCOUNTER — Ambulatory Visit (INDEPENDENT_AMBULATORY_CARE_PROVIDER_SITE_OTHER): Payer: Medicare Other | Admitting: Nurse Practitioner

## 2019-06-14 ENCOUNTER — Encounter: Payer: Self-pay | Admitting: Nurse Practitioner

## 2019-06-14 ENCOUNTER — Other Ambulatory Visit: Payer: Self-pay

## 2019-06-14 DIAGNOSIS — J329 Chronic sinusitis, unspecified: Secondary | ICD-10-CM | POA: Insufficient documentation

## 2019-06-14 DIAGNOSIS — J0100 Acute maxillary sinusitis, unspecified: Secondary | ICD-10-CM | POA: Diagnosis not present

## 2019-06-14 MED ORDER — AZITHROMYCIN 250 MG PO TABS: ORAL_TABLET | ORAL | 0 refills | Status: DC

## 2019-06-14 NOTE — Progress Notes (Signed)
BP 119/60 Comment: pt reported  Pulse 82   Temp (!) 97.5 F (36.4 C) (Oral)   Wt 224 lb 9.6 oz (101.9 kg)   BMI 37.96 kg/m    Subjective:    Patient ID: [name], female    DOB: 19-May-1951, 68 y.o.   MRN: [id number]  HPI: [name] is a 67 y.o. female  Chief Complaint  Patient presents with  . URI    pt states she has had congestion, drainage, headache, and sinus pressure for the past 4 days     . This visit was completed via telephone due to the restrictions of the COVID-19 pandemic. All issues as above were discussed and addressed but no physical exam was performed. If it was felt that the patient should be evaluated in the office, they were directed there. The patient verbally consented to this visit. Patient was unable to complete an audio/visual visit due to Lack of equipment. Due to the catastrophic nature of the COVID-19 pandemic, this visit was done through audio contact only. . Location of the patient: home . Location of the provider: work . Those involved with this call:  . Provider: Marnee Guarneri, DNP . CMA: Yvonna Alanis, CMA . Front Desk/Registration: Jill Side  . Time spent on call: 15 minutes on the phone discussing health concerns. 10 minutes total spent in review of patient's record and preparation of their chart.  . I verified patient identity using two factors (patient name and date of birth). Patient consents verbally to being seen via telemedicine visit today.    UPPER RESPIRATORY TRACT INFECTION Has had sinus issues on/off for several years, but current episode started about two weeks.  Has sinus headache, drainage.  No loss of taste or smell.  No exposure to Covid, has been staying inside with no visitors.  Has delivery for all needs.  In past on review has taken Zpack for sinus infections, last in 2018. Fever: no Cough: yes Shortness of breath: no Wheezing: no Chest pain: none Chest tightness: no Chest congestion: no Nasal congestion:  yes Runny nose: yes Post nasal drip: yes Sneezing: yes Sore throat: no Swollen glands: no Sinus pressure: yes Headache: yes Face pain: yes Toothache: no Ear pain: none Ear pressure: none Eyes red/itching:no Eye drainage/crusting: no  Vomiting: no Rash: no Fatigue: yes Sick contacts: no Strep contacts: no  Context: stable Recurrent sinusitis: usually gets sinus infection in fall Relief with OTC cold/cough medications: no  Treatments attempted: cold/sinus   Relevant past medical, surgical, family and social history reviewed and updated as indicated. Interim medical history since our last visit reviewed. Allergies and medications reviewed and updated.  Review of Systems  Constitutional: Positive for fatigue. Negative for activity change, appetite change, chills and fever.  HENT: Positive for congestion, postnasal drip, rhinorrhea, sinus pressure, sinus pain and sneezing. Negative for ear discharge, ear pain, facial swelling, sore throat and voice change.   Eyes: Negative for pain and visual disturbance.  Respiratory: Positive for cough. Negative for chest tightness, shortness of breath and wheezing.   Cardiovascular: Negative for chest pain, palpitations and leg swelling.  Gastrointestinal: Negative for abdominal distention, abdominal pain, constipation, diarrhea, nausea and vomiting.  Endocrine: Negative.   Musculoskeletal: Negative for myalgias.  Neurological: Negative for dizziness, numbness and headaches.  Psychiatric/Behavioral: Negative.     Per HPI unless specifically indicated above     Objective:    BP 119/60 Comment: pt reported  Pulse 82   Temp (!) 97.5  F (36.4 C) (Oral)   Wt 224 lb 9.6 oz (101.9 kg)   BMI 37.96 kg/m   Wt Readings from Last 3 Encounters:  06/14/19 224 lb 9.6 oz (101.9 kg)  04/27/19 228 lb (103.4 kg)  03/29/19 225 lb (102.1 kg)    Physical Exam   Unable to perform due to telephone visit only.  Reports tenderness under eyes with  palpation.  Results for orders placed or performed in visit on 04/27/19  CUP PACEART INCLINIC DEVICE CHECK  Result Value Ref Range   Date Time Interrogation Session [id number]    Pulse Generator Manufacturer BOST    Pulse Gen Model S404 ALTRUA 40    Pulse Gen Serial Number [id number]    Clinic Name [facility]    Implantable Pulse Generator Type Implantable Pulse Generator    Implantable Pulse Generator Implant Date AD:[id number]    Implantable Lead Manufacturer BOST    Implantable Lead Model 4479 Tharon Aquas    Implantable Lead Serial Number [id number]    Implantable Lead Implant Date SX:2336623    Implantable Lead Location [id number]    Implantable Lead Manufacturer [employer]    Implantable Lead Model 5076 CapSureFix Novus    Implantable Lead Serial Number [id number]    Implantable Lead Implant Date SX:2336623    Implantable Lead Location [id number]    Lead Channel Setting Sensing Sensitivity 2.5 mV   Lead Channel Setting Sensing Adaptation Mode Fixed Pacing    Lead Channel Setting Pacing Amplitude 2.0 V   Lead Channel Setting Pacing Pulse Width 0.40 ms   Lead Channel Setting Pacing Amplitude 2.4 V   Lead Channel Impedance Value 460 ohm   Lead Channel Sensing Intrinsic Amplitude 1.9 mV   Lead Channel Pacing Threshold Amplitude 0.5 V   Lead Channel Pacing Threshold Pulse Width 0.4 ms   Lead Channel Impedance Value 460 ohm   Lead Channel Sensing Intrinsic Amplitude 12.0 (>) mV   Lead Channel Pacing Threshold Amplitude 1.2 V   Lead Channel Pacing Threshold Pulse Width 0.40 ms   Battery Status BOS    Brady Statistic RA Percent Paced 0 %   Brady Statistic RV Percent Paced 26 %   Eval Rhythm AS/VS @ 74bpm       Assessment & Plan:   Problem List Items Addressed This Visit      Respiratory   Sinusitis    Acute, has been treated with Zpack in past.  No exposure to Covid, will defer testing at this time and obtain if worsening or continued symptoms, due recommend continuing to stay inside until  symptoms have improved.  Script for Zpack sent.  Recommend diabetic tussin for cough as needed. - Increased rest - Increasing Fluids - Acetaminophen as needed for pain.  - Saline sinus flushes or a neti pot.  - Humidifying the air Return for worsening or continued issues.      Relevant Medications   azithromycin (ZITHROMAX) 250 MG tablet      I discussed the assessment and treatment plan with the patient. The patient was provided an opportunity to ask questions and all were answered. The patient agreed with the plan and demonstrated an understanding of the instructions.   The patient was advised to call back or seek an in-person evaluation if the symptoms worsen or if the condition fails to improve as anticipated.   I provided 15  minutes of time during this encounter.  Follow up plan: Return if symptoms worsen or fail to improve.

## 2019-06-14 NOTE — Patient Instructions (Signed)

## 2019-06-14 NOTE — Assessment & Plan Note (Addendum)
Acute, has been treated with Zpack in past.  No exposure to Covid, will defer testing at this time and obtain if worsening or continued symptoms, due recommend continuing to stay inside until symptoms have improved.  Script for Zpack sent.  Recommend diabetic tussin for cough as needed. - Increased rest - Increasing Fluids - Acetaminophen as needed for pain.  - Saline sinus flushes or a neti pot.  - Humidifying the air Return for worsening or continued issues.

## 2019-06-25 NOTE — Progress Notes (Signed)
Patient is coming in Monday  for follow up would like her flu shot.

## 2019-06-27 NOTE — Progress Notes (Signed)
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  INTERVAL HISTORY: Patient returns to clinic today for repeat laboratory work and routine yearly evaluation.  She continues to have occasional cramping, but otherwise feels well.  Her peripheral neuropathy is unchanged.  She has no other neurologic complaints.  She denies any recent fevers or illnesses.  She denies any chest pain, shortness of breath, cough, or hemoptysis.  She has no nausea, vomiting, constipation, or diarrhea.  She has no urinary complaints.  Patient feels at her baseline offers no specific complaints today.  REVIEW OF SYSTEMS:   Review of Systems  Constitutional: Negative.  Negative for fever, malaise/fatigue and weight loss.  Respiratory: Negative.  Negative for cough and shortness of breath.   Cardiovascular: Negative.  Negative for chest pain and leg swelling.  Gastrointestinal: Negative.  Negative for abdominal pain.  Genitourinary: Negative.  Negative for dysuria.  Musculoskeletal: Negative.  Negative for back pain.       Occasional muscle cramping  Skin: Negative.  Negative for rash.  Neurological: Negative.  Negative for sensory change, focal weakness, weakness and headaches.  Psychiatric/Behavioral: Negative.  The patient is not nervous/anxious.    As per HPI. Otherwise, a complete review of systems is negative.   PAST MEDICAL HISTORY: Past Medical History:  Diagnosis Date  . Breast CA ([place])    Chemo/XRT/lumpectomy  . Breast cancer ([place]) 09/2007   left breast, radiation, chemo  . Cardiomyopathy secondary    . Hypersomnolence 09/21/2013  . Leg edema, left 09/21/2013  . 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 Pacemaker [employer]   . Sinus tachycardia   . Type 2 diabetes mellitus ([place]) 06/19/2015    PAST SURGICAL HISTORY: Past Surgical History:  Procedure Laterality Date  . 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 BREAST LUMPECTOMY    . CESAREAN SECTION  1983  . EYE SURGERY Left 12/2018   cataracts-Dr.Shah in [place]   . EYE SURGERY Right 01/2019   cataracts- Dr.Shah in [place]  . [place]  . INSERT / REPLACE / REMOVE PACEMAKER  2011   revision @ [place]  05/13/2003   Guidant Rodman Comp - DR    FAMILY HISTORY Family History  Problem Relation Age of Onset  . Diabetes Mother   . Heart disease Mother   . Thyroid disease Mother   . Hypertension Father   . Cancer Father   . Alcohol abuse Father   . Diabetes Father   . Stroke Other        Family hx of CVA or stroke  . Diabetes Brother   . Diabetes Daughter   . Diabetes Paternal Uncle   . Diabetes Paternal Grandmother   . Breast cancer Maternal Aunt        ADVANCED DIRECTIVES:    HEALTH MAINTENANCE: Social History   Tobacco Use  . Smoking status: Former Smoker    Quit date: 08/26/1981    Years since quitting: 37.8  . Smokeless tobacco: Never Used  Substance Use Topics  .  Alcohol use: Not Currently  . Drug use: No     Colonoscopy:  PAP:  Bone density:  Lipid panel:  Allergies  Allergen Reactions  . Penicillins Swelling    Causes organs to swell Other reaction(s): Other (See Comments) Organs flipped over per patient; skin starting splitting open and blood vessels burst in legs;  Causes organs to swell  . Levofloxacin Swelling    Leg swelling and muscle weakness Other reaction(s): Other (See Comments) Tendon pain and weakness in muscles Leg swelling and  muscle weakness    Current Outpatient Medications  Medication Sig Dispense Refill  . aMILoride (MIDAMOR) 5 MG tablet Take 1 tablet (5 mg total) by mouth daily. 90 tablet 4  . atenolol (TENORMIN) 50 MG tablet Take 1 tablet (50 mg total) by mouth daily. 90 tablet 4  . atorvastatin (LIPITOR) 40 MG tablet Take 1 tablet (40 mg total) by mouth daily. 90 tablet 4  . azithromycin (ZITHROMAX) 250 MG tablet Take 2 tablets by mouth (500 MG) on day 1 and then 1 tablet (250 MG) on days 2 to 5. 6 tablet 0  . cetirizine (ZYRTEC) 5 MG tablet Take 5 mg by mouth as needed for allergies. Seasonal allergies    . chlorpheniramine (CHLOR-TRIMETON) 4 MG tablet Take 4 mg by mouth every 6 (six) hours as needed for allergies.    Marland Kitchen CINNAMON PO Take by mouth daily.    . fluticasone (FLONASE) 50 MCG/ACT nasal spray Place 2 sprays into both nostrils daily. 16 g 12  . glucose blood (ACCU-CHEK AVIVA PLUS) test strip TEST BLOOD SUGAR ONCE DAILY 100 each 12  . magnesium oxide (MAG-OX) 400 MG tablet Take 400 mg by mouth 2 (two) times daily.    . metFORMIN (GLUCOPHAGE) 500 MG tablet Take 1 tablet (500 mg total) by mouth 2 (two) times daily with a meal. 180 tablet 3  . naproxen sodium (ANAPROX) 220 MG tablet Take 220 mg by mouth 2 (two) times daily with a meal.    . pregabalin (LYRICA) 50 MG capsule Take 1 capsule (50 mg total) by mouth 3 (three) times daily. 270 capsule 1  . SUMAtriptan (IMITREX) 100 MG tablet Take 1 tablet (100 mg total) by mouth as needed. 10 tablet 12   Current Facility-Administered Medications  Medication Dose Route Frequency Provider Last Rate Last Dose  . influenza vaccine adjuvanted (FLUAD) injection 0.5 mL  0.5 mL Intramuscular Once Lloyd Huger, MD        OBJECTIVE: Vitals:   06/28/19 0943  BP: 125/63  Pulse: 85  Resp: 18  Temp: 98.3 F (36.8 C)  SpO2: 97%     Body mass index is 38.19 kg/m.    ECOG FS:0 - Asymptomatic  General: Well-developed, well-nourished, no acute distress.  Eyes: Pink conjunctiva, anicteric sclera. HEENT: Normocephalic, moist mucous membranes. Breast: Patient declined breast exam today. Lungs: Clear to auscultation bilaterally. Heart: Regular rate and rhythm. No rubs, murmurs, or gallops. Abdomen: Soft, nontender, nondistended. No organomegaly noted, normoactive bowel sounds. Musculoskeletal: No edema, cyanosis, or clubbing. Neuro: Alert, answering all questions appropriately. Cranial nerves grossly intact. Skin: No rashes or petechiae noted. Psych: Normal affect.  LAB RESULTS:  Lab Results  Component Value Date   NA 143 03/29/2019   K 4.5 03/29/2019   CL 100 03/29/2019   CO2 24 03/29/2019   GLUCOSE 116 (H) 03/29/2019   BUN 18 03/29/2019   CREATININE 0.82 03/29/2019   CALCIUM 9.5 03/29/2019   PROT 6.8 09/21/2018   ALBUMIN  4.7 09/21/2018   AST 30 03/29/2019   ALT 32 03/29/2019   ALKPHOS 97 09/21/2018   BILITOT 0.5 09/21/2018   GFRNONAA 73 03/29/2019   GFRAA 84 03/29/2019    Lab Results  Component Value Date   WBC 5.5 09/21/2018   NEUTROABS 3.2 09/21/2018   HGB 14.0 09/21/2018   HCT 42.2 09/21/2018   MCV 90 09/21/2018   PLT 191 09/21/2018     STUDIES: No results [name].  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  Patient completed year long maintenance Herceptin on Jan 02, 2009.  Patient's most recent screening mammogram on Jan 11, 2019 was reported as BI-RADS 1.  Repeat in May 2021.  Patient is transitioning primary care physicians, therefore will return to clinic in 1 year for further evaluation at which time we can likely discharge her from clinic.  2.  Hypomagnesemia/cramping: Patient's magnesium is 1.4 today.  Proceed with 2 g IV magnesium.  Continue amiloride 5 mg daily and 3 tabs Slow-Mag twice a day.   Return to clinic in 4 and 8 months for laboratory work and consideration of IV magnesium.  Patient will receive magnesium infusion if her levels are <1.6 or if she is symptomatic. 3.  Pacemaker: Continue monitoring and evaluation per cardiology.    Patient expressed understanding and was in agreement with this plan. She also understands that She can call clinic at any time with any questions, concerns, or complaints.    Lloyd Huger, MD   06/28/2019 10:23 AM

## 2019-06-28 ENCOUNTER — Inpatient Hospital Stay (HOSPITAL_BASED_OUTPATIENT_CLINIC_OR_DEPARTMENT_OTHER): Payer: Medicare Other | Admitting: Oncology

## 2019-06-28 ENCOUNTER — Inpatient Hospital Stay: Payer: Medicare Other

## 2019-06-28 ENCOUNTER — Inpatient Hospital Stay: Payer: Medicare Other | Attending: Oncology

## 2019-06-28 ENCOUNTER — Other Ambulatory Visit: Payer: Self-pay

## 2019-06-28 VITALS — BP 125/63 | HR 85 | Temp 98.3°F | Resp 18 | Wt 226.0 lb

## 2019-06-28 DIAGNOSIS — G620 Drug-induced polyneuropathy: Secondary | ICD-10-CM | POA: Diagnosis not present

## 2019-06-28 DIAGNOSIS — Z950 Presence of cardiac pacemaker: Secondary | ICD-10-CM | POA: Diagnosis not present

## 2019-06-28 DIAGNOSIS — C50912 Malignant neoplasm of unspecified site of left female breast: Secondary | ICD-10-CM

## 2019-06-28 DIAGNOSIS — Z23 Encounter for immunization: Secondary | ICD-10-CM | POA: Diagnosis not present

## 2019-06-28 DIAGNOSIS — Z853 Personal history of malignant neoplasm of breast: Secondary | ICD-10-CM | POA: Insufficient documentation

## 2019-06-28 LAB — MAGNESIUM: Magnesium: 1.4 mg/dL — ABNORMAL LOW (ref 1.7–2.4)

## 2019-06-28 MED ORDER — MAGNESIUM SULFATE 2 GM/50ML IV SOLN
2.0000 g | Freq: Once | INTRAVENOUS | Status: AC
  Administered 2019-06-28: 2 g via INTRAVENOUS
  Filled 2019-06-28: qty 50

## 2019-06-28 MED ORDER — SODIUM CHLORIDE 0.9 % IV SOLN
INTRAVENOUS | Status: DC
  Administered 2019-06-28: 11:00:00 via INTRAVENOUS
  Filled 2019-06-28: qty 250

## 2019-06-28 MED ORDER — INFLUENZA VAC A&B SA ADJ QUAD 0.5 ML IM PRSY
0.5000 mL | PREFILLED_SYRINGE | Freq: Once | INTRAMUSCULAR | Status: AC
  Administered 2019-06-28: 0.5 mL via INTRAMUSCULAR

## 2019-09-26 ENCOUNTER — Encounter: Payer: Self-pay | Admitting: Nurse Practitioner

## 2019-09-27 ENCOUNTER — Other Ambulatory Visit: Payer: Self-pay

## 2019-09-27 ENCOUNTER — Ambulatory Visit (INDEPENDENT_AMBULATORY_CARE_PROVIDER_SITE_OTHER): Payer: Medicare PPO | Admitting: Nurse Practitioner

## 2019-09-27 ENCOUNTER — Encounter: Payer: Self-pay | Admitting: Nurse Practitioner

## 2019-09-27 VITALS — BP 117/70 | HR 73 | Temp 97.7°F | Ht 63.2 in | Wt 223.6 lb

## 2019-09-27 DIAGNOSIS — G620 Drug-induced polyneuropathy: Secondary | ICD-10-CM | POA: Diagnosis not present

## 2019-09-27 DIAGNOSIS — Z0000 Encounter for general adult medical examination without abnormal findings: Secondary | ICD-10-CM | POA: Diagnosis not present

## 2019-09-27 DIAGNOSIS — E1159 Type 2 diabetes mellitus with other circulatory complications: Secondary | ICD-10-CM | POA: Diagnosis not present

## 2019-09-27 DIAGNOSIS — I10 Essential (primary) hypertension: Secondary | ICD-10-CM

## 2019-09-27 DIAGNOSIS — T451X5A Adverse effect of antineoplastic and immunosuppressive drugs, initial encounter: Secondary | ICD-10-CM

## 2019-09-27 DIAGNOSIS — Z1211 Encounter for screening for malignant neoplasm of colon: Secondary | ICD-10-CM | POA: Diagnosis not present

## 2019-09-27 DIAGNOSIS — E538 Deficiency of other specified B group vitamins: Secondary | ICD-10-CM

## 2019-09-27 DIAGNOSIS — Z950 Presence of cardiac pacemaker: Secondary | ICD-10-CM

## 2019-09-27 DIAGNOSIS — E785 Hyperlipidemia, unspecified: Secondary | ICD-10-CM

## 2019-09-27 DIAGNOSIS — E1169 Type 2 diabetes mellitus with other specified complication: Secondary | ICD-10-CM | POA: Diagnosis not present

## 2019-09-27 DIAGNOSIS — Z853 Personal history of malignant neoplasm of breast: Secondary | ICD-10-CM

## 2019-09-27 LAB — BAYER DCA HB A1C WAIVED: HB A1C (BAYER DCA - WAIVED): 5.8 % (ref <7.0)

## 2019-09-27 LAB — MICROALBUMIN, URINE WAIVED
Creatinine, Urine Waived: 50 mg/dL (ref 10–300)
Microalb, Ur Waived: 10 mg/L (ref 0–19)
Microalb/Creat Ratio: <30 mg/g (ref <30)

## 2019-09-27 NOTE — Assessment & Plan Note (Signed)
Chronic, ongoing.  Continue Lyrica and adjust as needed, especially with aging monitor dose closely.  Will check B12 level today due to long term Metformin use.  Start supplement if on low side, this may benefit neuropathy.  Return in 3 months.

## 2019-09-27 NOTE — Assessment & Plan Note (Signed)
Continue collaboration with cardiology for checks. 

## 2019-09-27 NOTE — Progress Notes (Signed)
BP 117/70   Pulse 73   Temp 97.7 F (36.5 C) (Oral)   Ht 5' 3.2" (1.605 m)   Wt 223 lb 9.6 oz (101.4 kg)   SpO2 99%   BMI 39.36 kg/m    Subjective:    Patient ID: [name], female    DOB: May 21, 1951, 69 y.o.   MRN: [id number]  HPI: [name] is a 69 y.o. female presenting on 09/27/2019 for comprehensive medical examination. Current medical complaints include:none  She currently lives with: self Menopausal Symptoms: no   DIABETES Continues on Metformin 500 MG BID.  Last A1C in August 6.0%.   Hypoglycemic episodes:no Polydipsia/polyuria: no Visual disturbance: no Chest pain: no Paresthesias: no Glucose Monitoring: yes  Accucheck frequency: Daily  Fasting glucose: on average 100-110, this morning 115  Post prandial:  Evening:  Before meals: Taking Insulin?: no  Long acting insulin:  Short acting insulin: Blood Pressure Monitoring: daily Retinal Examination: Up to Date Foot Exam: Up to Date Pneumovax: Up to Date Influenza: Up to Date Aspirin: yes   HYPERTENSION / HYPERLIPIDEMIA Has pacemaker, PPM [employer].  Followed by cardiology and last seen 04/27/2019.   Satisfied with current treatment? yes Duration of hypertension: chronic BP monitoring frequency: daily BP range: 110/70 range at home BP medication side effects: no Duration of hyperlipidemia: chronic Cholesterol medication side effects: no Cholesterol supplements: none Medication compliance: good compliance Aspirin: no Recent stressors: no Recurrent headaches: no Visual changes: no Palpitations: no Dyspnea: no Chest pain: no Lower extremity edema: no Dizzy/lightheaded: no   HISTORY OF BREAST CA: Has ongoing neuropathy and hypomagnesemia, is followed by oncology.  Receives magnesium infusions.  Takes Lyrica for neuropathy pain.    Depression Screen done today and results listed below:  Depression screen [facility] 2/9 09/27/2019 03/29/2019 03/16/2018 09/09/2017 02/24/2017  Decreased Interest 0 0 0 0 0   Down, Depressed, Hopeless 0 0 0 0 0  PHQ - 2 Score 0 0 0 0 0  Altered sleeping - - 1 - -  Tired, decreased energy - - 1 - -  Change in appetite - - 0 - -  Feeling bad or failure about yourself  - - 0 - -  Trouble concentrating - - 0 - -  Moving slowly or fidgety/restless - - 0 - -  Suicidal thoughts - - 0 - -  PHQ-9 Score - - 2 - -    The patient does not have a history of falls. I did not complete a risk assessment for falls. A plan of care for falls was not documented.   Past Medical History:  Past Medical History:  Diagnosis Date  . Breast CA ([place])    Chemo/XRT/lumpectomy  . Breast cancer ([place]) 09/2007   left breast, radiation, chemo  . Cardiomyopathy secondary   . Hypersomnolence 09/21/2013  . Leg edema, left 09/21/2013  . 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 Pacemaker [employer]   . Sinus tachycardia   . Type 2 diabetes mellitus ([place]) 06/19/2015    Surgical History:  Past Surgical History:  Procedure Laterality Date  . 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 BREAST LUMPECTOMY    . CESAREAN SECTION  1983  . EYE SURGERY Left 12/2018   cataracts-Dr.Shah in [place]   . EYE SURGERY Right 01/2019   cataracts- Dr.Shah in [place]  . [place]  . INSERT /  REPLACE / REMOVE PACEMAKER  2011   revision @ [facility]  05/13/2003   [facility] [facility] Comp - DR    Medications:  Current Outpatient Medications on File Prior to Visit  Medication Sig  . aMILoride (MIDAMOR) 5 MG tablet Take 1 tablet (5 mg total) by mouth daily.  Marland Kitchen atenolol (TENORMIN) 50 MG tablet Take 1 tablet (50 mg total) by mouth daily.  Marland Kitchen atorvastatin (LIPITOR) 40 MG tablet Take 1 tablet (40 mg total) by mouth daily.  . cetirizine (ZYRTEC) 5 MG tablet Take 5 mg by mouth as needed for allergies. Seasonal allergies  . chlorpheniramine (CHLOR-TRIMETON) 4 MG  tablet Take 4 mg by mouth every 6 (six) hours as needed for allergies.  Marland Kitchen CINNAMON PO Take by mouth daily.  . fluticasone (FLONASE) 50 MCG/ACT nasal spray Place 2 sprays into both nostrils daily.  Marland Kitchen glucose blood (ACCU-CHEK AVIVA PLUS) test strip TEST BLOOD SUGAR ONCE DAILY  . magnesium oxide (MAG-OX) 400 MG tablet Take 400 mg by mouth 2 (two) times daily.  . metFORMIN (GLUCOPHAGE) 500 MG tablet Take 1 tablet (500 mg total) by mouth 2 (two) times daily with a meal.  . naproxen sodium (ANAPROX) 220 MG tablet Take 220 mg by mouth 2 (two) times daily with a meal.  . pregabalin (LYRICA) 50 MG capsule Take 1 capsule (50 mg total) by mouth 3 (three) times daily.  . SUMAtriptan (IMITREX) 100 MG tablet Take 1 tablet (100 mg total) by mouth as needed.   No current facility-administered medications on file prior to visit.    Allergies:  Allergies  Allergen Reactions  . Penicillins Swelling    Causes organs to swell Other reaction(s): Other (See Comments) Organs flipped over per patient; skin starting splitting open and blood vessels burst in legs;  Causes organs to swell  . Levofloxacin Swelling    Leg swelling and muscle weakness Other reaction(s): Other (See Comments) Tendon pain and weakness in muscles Leg swelling and muscle weakness    Social History:  Social History   Socioeconomic History  . Marital status: Divorced    Spouse name: Not on file  . Number of children: Not on file  . Years of education: Not on file  . Highest education level: Bachelor's degree (e.g., BA, AB, BS)  Occupational History  . Occupation: Full time    Employer: [employer]  Tobacco Use  . Smoking status: Former Smoker    Quit date: 08/26/1981    Years since quitting: 38.1  . Smokeless tobacco: Never Used  Substance and Sexual Activity  . Alcohol use: Not Currently  . Drug use: No  . Sexual activity: Not on file  Other Topics Concern  . Not on file  Social History Narrative    Divorced   Gets regular exercise   Social Determinants of Health   Financial Resource Strain:   . Difficulty of Paying Living Expenses: Not on file  Food Insecurity:   . Worried About Charity fundraiser in the Last Year: Not on file  . Ran Out of Food in the Last Year: Not on file  Transportation Needs:   . Lack of Transportation (Medical): Not on file  . Lack of Transportation (Non-Medical): Not on file  Physical Activity:   . Days of Exercise per Week: Not on file  . Minutes of Exercise per Session: Not on file  Stress:   . Feeling of Stress : Not on file  Social Connections:   .  Frequency of Communication with Friends and Family: Not on file  . Frequency of Social Gatherings with Friends and Family: Not on file  . Attends Religious Services: Not on file  . Active Member of Clubs or Organizations: Not on file  . Attends Archivist Meetings: Not on file  . Marital Status: Not on file  Intimate Partner Violence:   . Fear of Current or Ex-Partner: Not on file  . Emotionally Abused: Not on file  . Physically Abused: Not on file  . Sexually Abused: Not on file   Social History   Tobacco Use  Smoking Status Former Smoker  . Quit date: 08/26/1981  . Years since quitting: 38.1  Smokeless Tobacco Never Used   Social History   Substance and Sexual Activity  Alcohol Use Not Currently    Family History:  Family History  Problem Relation Age of Onset  . Diabetes Mother   . Heart disease Mother   . Thyroid disease Mother   . Hypertension Father   . Cancer Father   . Alcohol abuse Father   . Diabetes Father   . Stroke Other        Family hx of CVA or stroke  . Diabetes Brother   . Diabetes Daughter   . Diabetes Paternal Uncle   . Diabetes Paternal Grandmother   . Breast cancer Maternal Aunt     Past medical history, surgical history, medications, allergies, family history and social history reviewed with patient today and changes made to appropriate areas  of the chart.   Review of Systems - negative All other ROS negative except what is listed above and in the HPI.      Objective:    BP 117/70   Pulse 73   Temp 97.7 F (36.5 C) (Oral)   Ht 5' 3.2" (1.605 m)   Wt 223 lb 9.6 oz (101.4 kg)   SpO2 99%   BMI 39.36 kg/m   Wt Readings from Last 3 Encounters:  09/27/19 223 lb 9.6 oz (101.4 kg)  06/28/19 226 lb (102.5 kg)  06/14/19 224 lb 9.6 oz (101.9 kg)    Physical Exam Constitutional:      General: She is awake. She is not in acute distress.    Appearance: She is well-developed. She is not ill-appearing.  HENT:     Head: Normocephalic and atraumatic.     Right Ear: Hearing, tympanic membrane, ear canal and external ear normal. No drainage.     Left Ear: Hearing, tympanic membrane, ear canal and external ear normal. No drainage.     Nose: Nose normal.     Right Sinus: No maxillary sinus tenderness or frontal sinus tenderness.     Left Sinus: No maxillary sinus tenderness or frontal sinus tenderness.     Mouth/Throat:     Mouth: Mucous membranes are moist.     Pharynx: Oropharynx is clear. Uvula midline. No pharyngeal swelling, oropharyngeal exudate or posterior oropharyngeal erythema.  Eyes:     General: Lids are normal.        Right eye: No discharge.        Left eye: No discharge.     Extraocular Movements: Extraocular movements intact.     Conjunctiva/sclera: Conjunctivae normal.     Pupils: Pupils are equal, round, and reactive to light.     Visual Fields: Right eye visual fields normal and left eye visual fields normal.  Neck:     Thyroid: No thyromegaly.     Vascular:  No carotid bruit.     Trachea: Trachea normal.  Cardiovascular:     Rate and Rhythm: Normal rate and regular rhythm.     Heart sounds: Normal heart sounds. No murmur. No gallop.   Pulmonary:     Effort: Pulmonary effort is normal. No accessory muscle usage or respiratory distress.     Breath sounds: Normal breath sounds.  Chest:     Comments:  Deferred per patient request due to recent exam with oncology. Abdominal:     General: Bowel sounds are normal.     Palpations: Abdomen is soft. There is no hepatomegaly or splenomegaly.     Tenderness: There is no abdominal tenderness.  Musculoskeletal:        General: Normal range of motion.     Cervical back: Normal range of motion and neck supple.     Right lower leg: No edema.     Left lower leg: No edema.  Lymphadenopathy:     Head:     Right side of head: No submental, submandibular, tonsillar, preauricular or posterior auricular adenopathy.     Left side of head: No submental, submandibular, tonsillar, preauricular or posterior auricular adenopathy.     Cervical: No cervical adenopathy.  Skin:    General: Skin is warm and dry.     Capillary Refill: Capillary refill takes less than 2 seconds.     Findings: No rash.  Neurological:     Mental Status: She is alert and oriented to person, place, and time.     Cranial Nerves: Cranial nerves are intact.     Gait: Gait is intact.     Deep Tendon Reflexes: Reflexes are normal and symmetric.     Reflex Scores:      Brachioradialis reflexes are 2+ on the right side and 2+ on the left side.      Patellar reflexes are 2+ on the right side and 2+ on the left side. Psychiatric:        Attention and Perception: Attention normal.        Mood and Affect: Mood normal.        Speech: Speech normal.        Behavior: Behavior normal. Behavior is cooperative.        Thought Content: Thought content normal.        Judgment: Judgment normal.    Diabetic Foot Exam - Simple   Simple Foot Form Visual Inspection No deformities, no ulcerations, no other skin breakdown bilaterally: Yes Sensation Testing Intact to touch and monofilament testing bilaterally: Yes Pulse Check Posterior Tibialis and Dorsalis pulse intact bilaterally: Yes Comments     Results for orders placed or performed in visit on 06/28/19  Magnesium  Result Value Ref Range    Magnesium 1.4 (L) 1.7 - 2.4 mg/dL      Assessment & Plan:   Problem List Items Addressed This Visit      Cardiovascular and Mediastinum   Hypertension associated with diabetes ([place])    Chronic, ongoing.  Continue current medication regimen and adjust as needed + continue collaboration with cardiology.  CMP today.  Recommend she continue to monitor BP at home.  Return in 3 months.      Relevant Orders   Bayer DCA Hb A1c Waived   Microalbumin, Urine Waived   CBC with Differential/Platelet   Comprehensive metabolic panel     Endocrine   Diabetes mellitus associated with hormonal etiology ([place])    Chronic, ongoing with A1C today 5.8%.  Urine micro ALB 10 and A:C <30. Continue current medication regimen as A1C at goal and no hypoglycemia at home. If continued downward trend may consider reduction of medication.  Continue to monitor BS at home daily.   Return in 3 months for visit.      Relevant Orders   Bayer DCA Hb A1c Waived   Hyperlipidemia associated with type 2 diabetes mellitus ([facility])    Chronic, ongoing.  Continue current medication regimen and adjust as needed.  Lipid panel today.      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  Continue Lyrica and adjust as needed, especially with aging monitor dose closely.  Will check B12 level today due to long term Metformin use.  Start supplement if on low side, this may benefit neuropathy.  Return in 3 months.        Other   PPM-[employer]    Continue collaboration with cardiology for checks.      Hypomagnesemia    Continue collaboration with oncology and infusions as needed + Amiloride as ordered by nephrology.      History of breast cancer    Continue collaboration with oncology.       Other Visit Diagnoses    Annual physical exam    -  Primary   Annual labs  to include TSH, CBC, CMP, lipid   Relevant Orders   TSH   Vitamin B12 deficiency       history of low level reported, recheck today.  Long term Metformin use.   Relevant Orders   Vitamin B12   Colon cancer screening       Cologuard order   Relevant Orders   Cologuard       Follow up plan: Return in about 3 months (around 12/25/2019) for T2DM, HTN/HLD.   LABORATORY TESTING:  - Pap smear: not applicable  IMMUNIZATIONS:   - Tdap: Tetanus vaccination status reviewed: last tetanus booster within 10 years. - Influenza: Up to date - Pneumovax: Up to date - Prevnar: Given elsewhere - HPV: Not applicable - Zostavax vaccine: has prescription for this  SCREENING: -Mammogram: Up to date  - Colonoscopy: Ordered today  - Bone Density: Up to date done in 2015 and normal -Hearing Test: Not applicable  -Spirometry: Not applicable   PATIENT COUNSELING:   Advised to take 1 mg of folate supplement per day if capable of pregnancy.   Sexuality: Discussed sexually transmitted diseases, partner selection, use of condoms, avoidance of unintended pregnancy  and contraceptive alternatives.   Advised to avoid cigarette smoking.  I discussed with the patient that most people either abstain from alcohol or drink within safe limits (<=14/week and <=4 drinks/occasion for males, <=7/weeks and <= 3 drinks/occasion for females) and that the risk for alcohol disorders and other health effects rises proportionally with the number of drinks per week and how often a drinker exceeds daily limits.  Discussed cessation/primary prevention of drug use and availability of treatment for abuse.   Diet: Encouraged to adjust caloric intake to maintain  or achieve ideal body weight, to reduce intake of dietary saturated fat and total fat, to limit sodium intake by avoiding high sodium foods and not adding table salt, and to maintain adequate dietary potassium and calcium preferably from fresh fruits, vegetables, and  low-fat dairy products.  stressed the importance of regular exercise  Injury prevention: Discussed safety belts, safety helmets, smoke detector, smoking near bedding or upholstery.   Dental health: Discussed importance of regular tooth brushing, flossing, and dental visits.    NEXT PREVENTATIVE PHYSICAL DUE IN 1 YEAR. Return in about 3 months (around 12/25/2019) for T2DM, HTN/HLD.

## 2019-09-27 NOTE — Assessment & Plan Note (Signed)
Continue collaboration with oncology. ?

## 2019-09-27 NOTE — Assessment & Plan Note (Signed)
Chronic, ongoing.  Continue current medication regimen and adjust as needed. Lipid panel today. 

## 2019-09-27 NOTE — Patient Instructions (Signed)
Carbohydrate Counting for Diabetes Mellitus, Adult  Carbohydrate counting is a method of keeping track of how many carbohydrates you eat. Eating carbohydrates naturally increases the amount of sugar (glucose) in the blood. Counting how many carbohydrates you eat helps keep your blood glucose within normal limits, which helps you manage your diabetes (diabetes mellitus). It is important to know how many carbohydrates you can safely have in each meal. This is different for every person. A diet and nutrition specialist (registered dietitian) can help you make a meal plan and calculate how many carbohydrates you should have at each meal and snack. Carbohydrates are found in the following foods:  Grains, such as breads and cereals.  Dried beans and soy products.  Starchy vegetables, such as potatoes, peas, and corn.  Fruit and fruit juices.  Milk and yogurt.  Sweets and snack foods, such as cake, cookies, candy, chips, and soft drinks. How do I count carbohydrates? There are two ways to count carbohydrates in food. You can use either of the methods or a combination of both. Reading "Nutrition Facts" on packaged food The "Nutrition Facts" list is included on the labels of almost all packaged foods and beverages in the [place] It includes:  The serving size.  Information about nutrients in each serving, including the grams (g) of carbohydrate per serving. To use the "Nutrition Facts":  Decide how many servings you will have.  Multiply the number of servings by the number of carbohydrates per serving.  The resulting number is the total amount of carbohydrates that you will be having. Learning standard serving sizes of other foods When you eat carbohydrate foods that are not packaged or do not include "Nutrition Facts" on the label, you need to measure the servings in order to count the amount of carbohydrates:  Measure the foods that you will eat with a food scale or measuring cup, if  needed.  Decide how many standard-size servings you will eat.  Multiply the number of servings by 15. Most carbohydrate-rich foods have about 15 g of carbohydrates per serving. ? For example, if you eat 8 oz (170 g) of strawberries, you will have eaten 2 servings and 30 g of carbohydrates (2 servings x 15 g = 30 g).  For foods that have more than one food mixed, such as soups and casseroles, you must count the carbohydrates in each food that is included. The following list contains standard serving sizes of common carbohydrate-rich foods. Each of these servings has about 15 g of carbohydrates:   hamburger bun or  English muffin.   oz (15 mL) syrup.   oz (14 g) jelly.  1 slice of bread.  1 six-inch tortilla.  3 oz (85 g) cooked rice or pasta.  4 oz (113 g) cooked dried beans.  4 oz (113 g) starchy vegetable, such as peas, corn, or potatoes.  4 oz (113 g) hot cereal.  4 oz (113 g) mashed potatoes or  of a large baked potato.  4 oz (113 g) canned or frozen fruit.  4 oz (120 mL) fruit juice.  4-6 crackers.  6 chicken nuggets.  6 oz (170 g) unsweetened dry cereal.  6 oz (170 g) plain fat-free yogurt or yogurt sweetened with artificial sweeteners.  8 oz (240 mL) milk.  8 oz (170 g) fresh fruit or one small piece of fruit.  24 oz (680 g) popped popcorn. Example of carbohydrate counting Sample meal  3 oz (85 g) chicken breast.  6 oz (170 g)   brown rice.  4 oz (113 g) corn.  8 oz (240 mL) milk.  8 oz (170 g) strawberries with sugar-free whipped topping. Carbohydrate calculation 1. Identify the foods that contain carbohydrates: ? Rice. ? Corn. ? Milk. ? Strawberries. 2. Calculate how many servings you have of each food: ? 2 servings rice. ? 1 serving corn. ? 1 serving milk. ? 1 serving strawberries. 3. Multiply each number of servings by 15 g: ? 2 servings rice x 15 g = 30 g. ? 1 serving corn x 15 g = 15 g. ? 1 serving milk x 15 g = 15 g. ? 1  serving strawberries x 15 g = 15 g. 4. Add together all of the amounts to find the total grams of carbohydrates eaten: ? 30 g + 15 g + 15 g + 15 g = 75 g of carbohydrates total. Summary  Carbohydrate counting is a method of keeping track of how many carbohydrates you eat.  Eating carbohydrates naturally increases the amount of sugar (glucose) in the blood.  Counting how many carbohydrates you eat helps keep your blood glucose within normal limits, which helps you manage your diabetes.  A diet and nutrition specialist (registered dietitian) can help you make a meal plan and calculate how many carbohydrates you should have at each meal and snack. This information is not intended to replace advice given to you by your health care provider. Make sure you discuss any questions you have with your health care provider. Document Revised: 03/06/2017 Document Reviewed: 01/24/2016 Elsevier Patient Education  2020 Elsevier Inc.  

## 2019-09-27 NOTE — Assessment & Plan Note (Addendum)
Chronic, ongoing with A1C today 5.8%.  Urine micro ALB 10 and A:C <30. Continue current medication regimen as A1C at goal and no hypoglycemia at home. If continued downward trend may consider reduction of medication.  Continue to monitor BS at home daily.   Return in 3 months for visit.

## 2019-09-27 NOTE — Assessment & Plan Note (Signed)
Chronic, ongoing.  Continue current medication regimen and adjust as needed + continue collaboration with cardiology.  CMP today.  Recommend she continue to monitor BP at home.  Return in 3 months.

## 2019-09-27 NOTE — Assessment & Plan Note (Signed)
Continue collaboration with oncology and infusions as needed + Amiloride as ordered by nephrology. 

## 2019-09-28 ENCOUNTER — Encounter: Payer: Self-pay | Admitting: Nurse Practitioner

## 2019-09-28 DIAGNOSIS — E538 Deficiency of other specified B group vitamins: Secondary | ICD-10-CM | POA: Insufficient documentation

## 2019-09-28 LAB — COMPREHENSIVE METABOLIC PANEL
ALT: 28 IU/L (ref 0–32)
AST: 24 IU/L (ref 0–40)
Albumin/Globulin Ratio: 2.4 — ABNORMAL HIGH (ref 1.2–2.2)
Albumin: 4.5 g/dL (ref 3.8–4.8)
Alkaline Phosphatase: 108 IU/L (ref 39–117)
BUN/Creatinine Ratio: 25 (ref 12–28)
BUN: 21 mg/dL (ref 8–27)
Bilirubin Total: 0.4 mg/dL (ref 0.0–1.2)
CO2: 23 mmol/L (ref 20–29)
Calcium: 9.5 mg/dL (ref 8.7–10.3)
Chloride: 103 mmol/L (ref 96–106)
Creatinine, Ser: 0.84 mg/dL (ref 0.57–1.00)
GFR calc Af Amer: 82 mL/min/{1.73_m2} (ref >59)
GFR calc non Af Amer: 71 mL/min/{1.73_m2} (ref >59)
Globulin, Total: 1.9 g/dL (ref 1.5–4.5)
Glucose: 82 mg/dL (ref 65–99)
Potassium: 4.3 mmol/L (ref 3.5–5.2)
Sodium: 140 mmol/L (ref 134–144)
Total Protein: 6.4 g/dL (ref 6.0–8.5)

## 2019-09-28 LAB — CBC WITH DIFFERENTIAL/PLATELET
Basophils Absolute: 0.1 10*3/uL (ref 0.0–0.2)
Basos: 1 %
EOS (ABSOLUTE): 0.2 10*3/uL (ref 0.0–0.4)
Eos: 2 %
Hematocrit: 39.5 % (ref 34.0–46.6)
Hemoglobin: 13.4 g/dL (ref 11.1–15.9)
Immature Grans (Abs): 0 10*3/uL (ref 0.0–0.1)
Immature Granulocytes: 1 %
Lymphocytes Absolute: 2 10*3/uL (ref 0.7–3.1)
Lymphs: 32 %
MCH: 29.7 pg (ref 26.6–33.0)
MCHC: 33.9 g/dL (ref 31.5–35.7)
MCV: 88 fL (ref 79–97)
Monocytes Absolute: 0.5 10*3/uL (ref 0.1–0.9)
Monocytes: 8 %
Neutrophils Absolute: 3.5 10*3/uL (ref 1.4–7.0)
Neutrophils: 56 %
Platelets: 212 10*3/uL (ref 150–450)
RBC: 4.51 x10E6/uL (ref 3.77–5.28)
RDW: 13.1 % (ref 11.7–15.4)
WBC: 6.3 10*3/uL (ref 3.4–10.8)

## 2019-09-28 LAB — LIPID PANEL W/O CHOL/HDL RATIO
Cholesterol, Total: 129 mg/dL (ref 100–199)
HDL: 47 mg/dL (ref >39)
LDL Chol Calc (NIH): 61 mg/dL (ref 0–99)
Triglycerides: 120 mg/dL (ref 0–149)
VLDL Cholesterol Cal: 21 mg/dL (ref 5–40)

## 2019-09-28 LAB — VITAMIN B12: Vitamin B-12: 287 pg/mL (ref 232–1245)

## 2019-09-28 LAB — TSH: TSH: 3.11 u[IU]/mL (ref 0.450–4.500)

## 2019-09-28 NOTE — Progress Notes (Signed)
Contacted via MyChart

## 2019-10-11 LAB — HM DIABETES EYE EXAM

## 2019-10-13 ENCOUNTER — Other Ambulatory Visit: Payer: Self-pay

## 2019-10-13 MED ORDER — FLUTICASONE PROPIONATE 50 MCG/ACT NA SUSP: 2.0000 | Freq: Every day | NASAL | 12 refills | Status: DC

## 2019-10-13 NOTE — Telephone Encounter (Signed)
Patient last seen 09/27/19

## 2019-10-18 ENCOUNTER — Other Ambulatory Visit: Payer: Self-pay

## 2019-10-18 DIAGNOSIS — G620 Drug-induced polyneuropathy: Secondary | ICD-10-CM

## 2019-10-18 DIAGNOSIS — T451X5A Adverse effect of antineoplastic and immunosuppressive drugs, initial encounter: Secondary | ICD-10-CM

## 2019-10-18 LAB — COLOGUARD: Cologuard: NEGATIVE

## 2019-10-18 MED ORDER — PREGABALIN 50 MG PO CAPS: 50.0000 mg | ORAL_CAPSULE | Freq: Three times a day (TID) | ORAL | 1 refills | Status: DC

## 2019-10-18 NOTE — Telephone Encounter (Signed)
LOV: 09/27/2019, NOV: 12/28/2019 with Marnee Guarneri, NP

## 2019-10-19 ENCOUNTER — Ambulatory Visit (INDEPENDENT_AMBULATORY_CARE_PROVIDER_SITE_OTHER): Payer: Medicare PPO | Admitting: Internal Medicine

## 2019-10-19 ENCOUNTER — Encounter: Payer: Self-pay | Admitting: Internal Medicine

## 2019-10-19 ENCOUNTER — Other Ambulatory Visit: Payer: Self-pay

## 2019-10-19 VITALS — BP 120/78 | HR 82 | Ht 64.0 in | Wt 226.2 lb

## 2019-10-19 DIAGNOSIS — I440 Atrioventricular block, first degree: Secondary | ICD-10-CM

## 2019-10-19 DIAGNOSIS — Z950 Presence of cardiac pacemaker: Secondary | ICD-10-CM

## 2019-10-19 NOTE — Patient Instructions (Signed)
Medication Instructions:  - Your physician recommends that you continue on your current medications as directed. Please refer to the Current Medication list given to you today.  *If you need a refill on your cardiac medications before your next appointment, please call your pharmacy*  Lab Work: - none ordered  If you have labs (blood work) drawn today and your tests are completely normal, you will receive your results only by: . MyChart Message (if you have MyChart) OR . A paper copy in the mail If you have any lab test that is abnormal or we need to change your treatment, we will call you to review the results.  Testing/Procedures: - none ordered  Follow-Up: At [facility], you and your health needs are our priority.  As part of our continuing mission to provide you with exceptional heart care, we have created designated Provider Care Teams.  These Care Teams include your primary Cardiologist (physician) and Advanced Practice Providers (APPs -  Physician Assistants and Nurse Practitioners) who all work together to provide you with the care you need, when you need it.  Your next appointment:   1 year(s)  The format for your next appointment:   In Person  Provider:   Steven Klein, MD  Other Instructions - n/a  

## 2019-10-19 NOTE — Progress Notes (Signed)
Man    Electrophysiology Office Note   Date:  10/19/2019   ID:  Carmela, Piechowski 1951/07/03, MRN [id number]  PCP:  Guadalupe Maple, MD  Cardiologist:  none Primary Electrophysiologist:    Virl Axe, MD    Chief Complaint  Patient presents with  . office visit    6 month F/U with pacer check; Meds verbally reviewed with patient.     History of Present Illness: [name] is a 68 y.o. 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  There have been no, lightheadedness or syncope.  .             Past Medical History:  Diagnosis Date  . Breast CA ([place])    Chemo/XRT/lumpectomy  . Breast cancer ([place]) 09/2007   left breast, radiation, chemo  . Cardiomyopathy secondary   . Hypersomnolence 09/21/2013  . Leg edema, left 09/21/2013  . 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 Pacemaker [employer]   . Sinus tachycardia   . Type 2 diabetes mellitus ([place]) 06/19/2015   Past Surgical History:  Procedure Laterality Date  . 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 BREAST LUMPECTOMY    . CESAREAN SECTION  1983  . EYE SURGERY Left 12/2018   cataracts-Dr.Shah in [place]   . EYE SURGERY Right 01/2019   cataracts- Dr.Shah in [place]  . [place]  . INSERT / REPLACE / REMOVE PACEMAKER  2011   revision @ [place]  05/13/2003   [facility] - DR      Allergies:   Penicillins and Levofloxacin      PHYSICAL EXAM: VS:  BP 120/78 (BP Location: Right Arm, Patient Position: Sitting, Cuff  Size: Large)   Pulse 82   Ht '5\' 4"'  (1.626 m)   Wt 226 lb 4 oz (102.6 kg)   SpO2 98%   BMI 38.84 kg/m  , BMI Body mass index is 38.84 kg/m. Well developed and well nourished in no acute distress HENT normal Neck supple with JVP-flat Clear Device pocket well healed; without hematoma or erythema.  There is no tethering  Regular rate and rhythm, no  gallop No murmur Abd-soft with active BS No Clubbing cyanosis edema Skin-warm and dry A & Oriented  Grossly normal sensory and motor function  ECG sinus @ 82    Recent Labs: 06/28/2019: Magnesium 1.4 09/27/2019: ALT 28; BUN 21; Creatinine, Ser 0.84; Hemoglobin 13.4; Platelets 212; Potassium 4.3; Sodium 140; TSH 3.110  Personally reviewed    Lipid Panel     Component Value Date/Time   CHOL 129 09/27/2019 1351   CHOL 145 03/29/2019 0814   TRIG 120 09/27/2019 1351   TRIG 124 03/29/2019 0814   HDL 47 09/27/2019 1351   CHOLHDL 3.0 09/21/2018 1417   VLDL 25 03/29/2019 0814   LDLCALC 61 09/27/2019 1351     Wt Readings from Last 3 Encounters:  10/19/19 226 lb 4 oz (102.6 kg)  09/27/19 223 lb 9.6 oz (101.4 kg)  06/28/19 226 lb (102.5 kg)  Other studies Reviewed: Additional studies/ records that were reviewed today include: none   Review of the above records today demonstrates: NA   ASSESSMENT AND PLAN: Dyspnea on Exertion/HFpEF  1 AV block--intermittent  Hypertension  Diabetes  Pacemaker [employer]     Vpacing decreased 30>>19%  Dyspnea is better  BP well controlled  Euvolemic continue current meds       Signed, Virl Axe, MD  10/19/2019 11:04 AM     [facility] [address] [place] [address] [phone] (office) [phone] (fax)

## 2019-10-21 LAB — CUP PACEART INCLINIC DEVICE CHECK
Brady Statistic RA Percent Paced: 0 %
Brady Statistic RV Percent Paced: 19 %
Date Time Interrogation Session: 20210223130158
Implantable Lead Implant Date: 20040917
Implantable Lead Implant Date: 20040917
Implantable Lead Location: 753859
Implantable Lead Location: 753860
Implantable Lead Model: 4479
Implantable Lead Model: 5076
Implantable Lead Serial Number: 312732
Implantable Pulse Generator Implant Date: 20111209
Lead Channel Impedance Value: 460 Ohm
Lead Channel Impedance Value: 480 Ohm
Lead Channel Pacing Threshold Amplitude: 0.6 V
Lead Channel Pacing Threshold Amplitude: 1 V
Lead Channel Pacing Threshold Pulse Width: 0.4 ms
Lead Channel Pacing Threshold Pulse Width: 0.4 ms
Lead Channel Sensing Intrinsic Amplitude: 10.8 mV
Lead Channel Sensing Intrinsic Amplitude: 2.2 mV
Lead Channel Setting Pacing Amplitude: 2 V
Lead Channel Setting Pacing Amplitude: 2.4 V
Lead Channel Setting Pacing Pulse Width: 0.4 ms
Lead Channel Setting Sensing Sensitivity: 2.5 mV
Pulse Gen Serial Number: 595359

## 2019-10-21 LAB — COLOGUARD: COLOGUARD: NEGATIVE

## 2019-10-26 ENCOUNTER — Inpatient Hospital Stay: Payer: Medicare PPO

## 2019-11-03 ENCOUNTER — Other Ambulatory Visit: Payer: Self-pay

## 2019-11-03 ENCOUNTER — Inpatient Hospital Stay: Payer: Medicare PPO | Attending: Oncology

## 2019-11-03 ENCOUNTER — Inpatient Hospital Stay: Payer: Medicare PPO

## 2019-11-03 LAB — MAGNESIUM: Magnesium: 1.4 mg/dL — ABNORMAL LOW (ref 1.7–2.4)

## 2019-11-03 MED ORDER — SODIUM CHLORIDE 0.9 % IV SOLN
INTRAVENOUS | Status: DC
  Filled 2019-11-03: qty 250

## 2019-11-03 MED ORDER — MAGNESIUM SULFATE 2 GM/50ML IV SOLN
2.0000 g | Freq: Once | INTRAVENOUS | Status: AC
  Administered 2019-11-03: 2 g via INTRAVENOUS
  Filled 2019-11-03: qty 50

## 2019-12-15 ENCOUNTER — Other Ambulatory Visit: Payer: Self-pay | Admitting: Nurse Practitioner

## 2019-12-15 DIAGNOSIS — I10 Essential (primary) hypertension: Secondary | ICD-10-CM

## 2019-12-15 MED ORDER — AMILORIDE HCL 5 MG PO TABS: 5.0000 mg | ORAL_TABLET | Freq: Every day | ORAL | 4 refills | Status: DC

## 2019-12-15 MED ORDER — ATENOLOL 50 MG PO TABS: 50.0000 mg | ORAL_TABLET | Freq: Every day | ORAL | 4 refills | Status: DC

## 2019-12-21 ENCOUNTER — Other Ambulatory Visit: Payer: Self-pay

## 2019-12-21 DIAGNOSIS — E119 Type 2 diabetes mellitus without complications: Secondary | ICD-10-CM

## 2019-12-21 MED ORDER — METFORMIN HCL 500 MG PO TABS: 500.0000 mg | ORAL_TABLET | Freq: Two times a day (BID) | ORAL | 3 refills | Status: DC

## 2019-12-21 NOTE — Telephone Encounter (Signed)
Patient last seen 09/27/19 and has appointment 12/28/19

## 2019-12-28 ENCOUNTER — Ambulatory Visit (INDEPENDENT_AMBULATORY_CARE_PROVIDER_SITE_OTHER): Payer: Medicare PPO | Admitting: Nurse Practitioner

## 2019-12-28 ENCOUNTER — Encounter: Payer: Self-pay | Admitting: Nurse Practitioner

## 2019-12-28 ENCOUNTER — Other Ambulatory Visit: Payer: Self-pay

## 2019-12-28 VITALS — BP 117/73 | HR 75 | Temp 98.2°F | Wt 226.2 lb

## 2019-12-28 DIAGNOSIS — T451X5A Adverse effect of antineoplastic and immunosuppressive drugs, initial encounter: Secondary | ICD-10-CM

## 2019-12-28 DIAGNOSIS — I152 Hypertension secondary to endocrine disorders: Secondary | ICD-10-CM

## 2019-12-28 DIAGNOSIS — I10 Essential (primary) hypertension: Secondary | ICD-10-CM

## 2019-12-28 DIAGNOSIS — R232 Flushing: Secondary | ICD-10-CM | POA: Insufficient documentation

## 2019-12-28 DIAGNOSIS — E785 Hyperlipidemia, unspecified: Secondary | ICD-10-CM

## 2019-12-28 DIAGNOSIS — Z853 Personal history of malignant neoplasm of breast: Secondary | ICD-10-CM

## 2019-12-28 DIAGNOSIS — E1159 Type 2 diabetes mellitus with other circulatory complications: Secondary | ICD-10-CM

## 2019-12-28 DIAGNOSIS — I440 Atrioventricular block, first degree: Secondary | ICD-10-CM

## 2019-12-28 DIAGNOSIS — E1169 Type 2 diabetes mellitus with other specified complication: Secondary | ICD-10-CM | POA: Diagnosis not present

## 2019-12-28 DIAGNOSIS — E538 Deficiency of other specified B group vitamins: Secondary | ICD-10-CM

## 2019-12-28 DIAGNOSIS — G620 Drug-induced polyneuropathy: Secondary | ICD-10-CM | POA: Diagnosis not present

## 2019-12-28 LAB — BAYER DCA HB A1C WAIVED: HB A1C (BAYER DCA - WAIVED): 6 % (ref <7.0)

## 2019-12-28 NOTE — Assessment & Plan Note (Signed)
Chronic, ongoing.  Continue Lyrica and adjust as needed, especially with aging monitor dose closely.  Return in 3 months.

## 2019-12-28 NOTE — Assessment & Plan Note (Signed)
Reports ongoing on/off hot flashes for over a year.  Does not wish to start medication at this time.  Discussed and provided education on natural treatments at home.

## 2019-12-28 NOTE — Assessment & Plan Note (Signed)
Continue collaboration with oncology. ?

## 2019-12-28 NOTE — Progress Notes (Signed)
BP 117/73   Pulse 75   Temp 98.2 F (36.8 C) (Oral)   Wt 226 lb 3.2 oz (102.6 kg)   SpO2 97%   BMI 38.83 kg/m    Subjective:    Patient ID: [name], female    DOB: 1951-03-09, 69 y.o.   MRN: [id number]  HPI: [name] is a 69 y.o. female  Chief Complaint  Patient presents with  . Diabetes  . Hyperlipidemia  . Hypertension   DIABETES Continues on Metformin 500 MG BID.  Last A1C in February 5.8%.  Her B12 level was 287 on recent check, tried taking B12 and it elevated sugars she reports.  Hypoglycemic episodes:no Polydipsia/polyuria: no Visual disturbance: no Chest pain: no Paresthesias: no Glucose Monitoring: yes             Accucheck frequency: Daily             Fasting glucose: on average 110-120 -- 132 this AM             Post prandial:             Evening:             Before meals: Taking Insulin?: no             Long acting insulin:             Short acting insulin: Blood Pressure Monitoring: daily Retinal Examination: Up to Date Foot Exam: Up to Date Pneumovax: Up to Date Influenza: Up to Date Aspirin: yes   HYPERTENSION / HYPERLIPIDEMIA Has pacemaker, PPM [employer].  Followed by cardiology and last seen 10/19/2019, note reviewed and no changes made.  Has 4 more years on current pacemaker, this is pacemaker #2. Satisfied with current treatment? yes Duration of hypertension: chronic BP monitoring frequency: daily BP range: 110/70 range at home BP medication side effects: no Duration of hyperlipidemia: chronic Cholesterol medication side effects: no Cholesterol supplements: none Medication compliance: good compliance Aspirin: no Recent stressors: no Recurrent headaches: no Visual changes: no Palpitations: no Dyspnea: no Chest pain: no Lower extremity edema: no Dizzy/lightheaded: no    HISTORY OF BREAST CA: Has ongoing neuropathy and hypomagnesemia, is followed by oncology. Receives magnesium infusions. Takes Lyrica for neuropathy  pain. Reports she has been recently having night sweats and hot flashes, varies day to day with sometimes having none and then other days having several.  Still has uterus.    Relevant past medical, surgical, family and social history reviewed and updated as indicated. Interim medical history since our last visit reviewed. Allergies and medications reviewed and updated.  Review of Systems  Constitutional: Negative for activity change, appetite change, diaphoresis, fatigue and fever.  Respiratory: Negative for cough, chest tightness and shortness of breath.   Cardiovascular: Negative for chest pain, palpitations and leg swelling.  Gastrointestinal: Negative.   Neurological: Negative.   Psychiatric/Behavioral: Negative.     Per HPI unless specifically indicated above     Objective:    BP 117/73   Pulse 75   Temp 98.2 F (36.8 C) (Oral)   Wt 226 lb 3.2 oz (102.6 kg)   SpO2 97%   BMI 38.83 kg/m   Wt Readings from Last 3 Encounters:  12/28/19 226 lb 3.2 oz (102.6 kg)  10/19/19 226 lb 4 oz (102.6 kg)  09/27/19 223 lb 9.6 oz (101.4 kg)    Physical Exam Vitals and nursing note reviewed.  Constitutional:      General: She  is awake. She is not in acute distress.    Appearance: She is well-developed and well-groomed. She is morbidly obese. She is not ill-appearing.  HENT:     Head: Normocephalic.     Right Ear: Hearing normal.     Left Ear: Hearing normal.  Eyes:     General: Lids are normal.        Right eye: No discharge.        Left eye: No discharge.     Conjunctiva/sclera: Conjunctivae normal.     Pupils: Pupils are equal, round, and reactive to light.  Neck:     Thyroid: No thyromegaly.     Vascular: No carotid bruit.  Cardiovascular:     Rate and Rhythm: Normal rate and regular rhythm.     Heart sounds: Normal heart sounds. No murmur. No gallop.   Pulmonary:     Effort: Pulmonary effort is normal. No accessory muscle usage or respiratory distress.     Breath sounds:  Normal breath sounds.  Abdominal:     General: Bowel sounds are normal.     Palpations: Abdomen is soft.  Musculoskeletal:     Cervical back: Normal range of motion and neck supple.     Right lower leg: No edema.     Left lower leg: No edema.  Skin:    General: Skin is warm and dry.  Neurological:     Mental Status: She is alert and oriented to person, place, and time.  Psychiatric:        Attention and Perception: Attention normal.        Mood and Affect: Mood normal.        Speech: Speech normal.        Behavior: Behavior normal. Behavior is cooperative.        Thought Content: Thought content normal.     Results for orders placed or performed in visit on 11/03/19  Magnesium  Result Value Ref Range   Magnesium 1.4 (L) 1.7 - 2.4 mg/dL      Assessment & Plan:   Problem List Items Addressed This Visit      Cardiovascular and Mediastinum   AV BLOCK, 1ST DEGREE    Followed by cardiology, continue collaboration and review notes.      Hypertension associated with diabetes (HCC)    Chronic, ongoing.  Continue current medication regimen and adjust as needed + continue collaboration with cardiology.  BMP next visit.  Recommend she continue to monitor BP at home.  Return in 3 months.      Hot flashes    Reports ongoing on/off hot flashes for over a year.  Does not wish to start medication at this time.  Discussed and provided education on natural treatments at home.        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 Last urine micro ALB 10 and A:C <30. Continue current medication regimen as A1C at goal and no hypoglycemia at home. If continued downward trend may consider reduction of medication.  Continue to monitor BS at home daily.   Return in 3 months for visit.      Relevant Orders   Bayer DCA Hb A1c Waived   Hyperlipidemia associated with type 2 diabetes mellitus (HCC)    Chronic, ongoing.   Continue current medication regimen and adjust as needed.  Lipid panel next visit.        Nervous and  Auditory   Chemotherapy-induced neuropathy (HCC)    Chronic, ongoing.  Continue Lyrica and adjust as needed, especially with aging monitor dose closely.  Return in 3 months.        Other   History of breast cancer    Continue collaboration with oncology.      B12 deficiency    Did not tolerate B12 supplement daily, reports it increased her sugar levels.  Recheck B12 today and consider weekly or every other day B12.      Relevant Orders   Vitamin B12       Follow up plan: Return in about 3 months (around 03/29/2020) for T2DM, HTN/HLD.

## 2019-12-28 NOTE — Assessment & Plan Note (Signed)
Chronic, ongoing with A1C today 6%. Last urine micro ALB 10 and A:C <30. Continue current medication regimen as A1C at goal and no hypoglycemia at home. If continued downward trend may consider reduction of medication.  Continue to monitor BS at home daily.   Return in 3 months for visit.

## 2019-12-28 NOTE — Assessment & Plan Note (Signed)
Followed by cardiology, continue collaboration and review notes.

## 2019-12-28 NOTE — Assessment & Plan Note (Signed)
Chronic, ongoing.  Continue current medication regimen and adjust as needed + continue collaboration with cardiology.  BMP next visit.  Recommend she continue to monitor BP at home.  Return in 3 months.

## 2019-12-28 NOTE — Patient Instructions (Signed)
Menopause Menopause is the normal time of life when menstrual periods stop completely. It is usually confirmed by 12 months without a menstrual period. The transition to menopause (perimenopause) most often happens between the ages of 45 and 55. During perimenopause, hormone levels change in your body, which can cause symptoms and affect your health. Menopause may increase your risk for:  Loss of bone (osteoporosis), which causes bone breaks (fractures).  Depression.  Hardening and narrowing of the arteries (atherosclerosis), which can cause heart attacks and strokes. What are the causes? This condition is usually caused by a natural change in hormone levels that happens as you get older. The condition may also be caused by surgery to remove both ovaries (bilateral oophorectomy). What increases the risk? This condition is more likely to start at an earlier age if you have certain medical conditions or treatments, including:  A tumor of the pituitary gland in the brain.  A disease that affects the ovaries and hormone production.  Radiation treatment for cancer.  Certain cancer treatments, such as chemotherapy or hormone (anti-estrogen) therapy.  Heavy smoking and excessive alcohol use.  Family history of early menopause. This condition is also more likely to develop earlier in women who are very thin. What are the signs or symptoms? Symptoms of this condition include:  Hot flashes.  Irregular menstrual periods.  Night sweats.  Changes in feelings about sex. This could be a decrease in sex drive or an increased comfort around your sexuality.  Vaginal dryness and thinning of the vaginal walls. This may cause painful intercourse.  Dryness of the skin and development of wrinkles.  Headaches.  Problems sleeping (insomnia).  Mood swings or irritability.  Memory problems.  Weight gain.  Hair growth on the face and chest.  Bladder infections or problems with urinating. How  is this diagnosed? This condition is diagnosed based on your medical history, a physical exam, your age, your menstrual history, and your symptoms. Hormone tests may also be done. How is this treated? In some cases, no treatment is needed. You and your health care provider should make a decision together about whether treatment is necessary. Treatment will be based on your individual condition and preferences. Treatment for this condition focuses on managing symptoms. Treatment may include:  Menopausal hormone therapy (MHT).  Medicines to treat specific symptoms or complications.  Acupuncture.  Vitamin or herbal supplements. Before starting treatment, make sure to let your health care provider know if you have a personal or family history of:  Heart disease.  Breast cancer.  Blood clots.  Diabetes.  Osteoporosis. Follow these instructions at home: Lifestyle  Do not use any products that contain nicotine or tobacco, such as cigarettes and e-cigarettes. If you need help quitting, ask your health care provider.  Get at least 30 minutes of physical activity on 5 or more days each week.  Avoid alcoholic and caffeinated beverages, as well as spicy foods. This may help prevent hot flashes.  Get 7-8 hours of sleep each night.  If you have hot flashes, try: ? Dressing in layers. ? Avoiding things that may trigger hot flashes, such as spicy food, warm places, or stress. ? Taking slow, deep breaths when a hot flash starts. ? Keeping a fan in your home and office.  Find ways to manage stress, such as deep breathing, meditation, or journaling.  Consider going to group therapy with other women who are having menopause symptoms. Ask your health care provider about recommended group therapy meetings. Eating and   drinking  Eat a healthy, balanced diet that contains whole grains, lean protein, low-fat dairy, and plenty of fruits and vegetables.  Your health care provider may recommend  adding more soy to your diet. Foods that contain soy include tofu, tempeh, and soy milk.  Eat plenty of foods that contain calcium and vitamin D for bone health. Items that are rich in calcium include low-fat milk, yogurt, beans, almonds, sardines, broccoli, and kale. Medicines  Take over-the-counter and prescription medicines only as told by your health care provider.  Talk with your health care provider before starting any herbal supplements. If prescribed, take vitamins and supplements as told by your health care provider. These may include: ? Calcium. Women age 51 and older should get 1,200 mg (milligrams) of calcium every day. ? Vitamin D. Women need 600-800 International Units of vitamin D each day. ? Vitamins B12 and B6. Aim for 50 micrograms of B12 and 1.5 mg of B6 each day. General instructions  Keep track of your menstrual periods, including: ? When they occur. ? How heavy they are and how long they last. ? How much time passes between periods.  Keep track of your symptoms, noting when they start, how often you have them, and how long they last.  Use vaginal lubricants or moisturizers to help with vaginal dryness and improve comfort during sex.  Keep all follow-up visits as told by your health care provider. This is important. This includes any group therapy or counseling. Contact a health care provider if:  You are still having menstrual periods after age 55.  You have pain during sex.  You have not had a period for 12 months and you develop vaginal bleeding. Get help right away if:  You have: ? Severe depression. ? Excessive vaginal bleeding. ? Pain when you urinate. ? A fast or irregular heart beat (palpitations). ? Severe headaches. ? Abdomen (abdominal) pain or severe indigestion.  You fell and you think you have a broken bone.  You develop leg or chest pain.  You develop vision problems.  You feel a lump in your breast. Summary  Menopause is the normal  time of life when menstrual periods stop completely. It is usually confirmed by 12 months without a menstrual period.  The transition to menopause (perimenopause) most often happens between the ages of 45 and 55.  Symptoms can be managed through medicines, lifestyle changes, and complementary therapies such as acupuncture.  Eat a balanced diet that is rich in nutrients to promote bone health and heart health and to manage symptoms during menopause. This information is not intended to replace advice given to you by your health care provider. Make sure you discuss any questions you have with your health care provider. Document Revised: 07/25/2017 Document Reviewed: 09/14/2016 Elsevier Patient Education  2020 Elsevier Inc.  

## 2019-12-28 NOTE — Assessment & Plan Note (Signed)
Chronic, ongoing.  Continue current medication regimen and adjust as needed.  Lipid panel next visit.    

## 2019-12-28 NOTE — Assessment & Plan Note (Signed)
Did not tolerate B12 supplement daily, reports it increased her sugar levels.  Recheck B12 today and consider weekly or every other day B12.

## 2019-12-29 LAB — VITAMIN B12: Vitamin B-12: 418 pg/mL (ref 232–1245)

## 2019-12-29 NOTE — Progress Notes (Signed)
Contacted via [place] [facility] [place], your B12 level returned and is improved from previous.  However still on lower side of the normal.  You could try taking B12 every other day or weekly and see how you do.  If any questions let me know.   Keep being awesome!! Kindest regards, Diamonique Ruedas

## 2020-01-14 ENCOUNTER — Ambulatory Visit
Admission: RE | Admit: 2020-01-14 | Discharge: 2020-01-14 | Disposition: A | Discharge status: Home / Self Care | Payer: Medicare PPO | Source: Ambulatory Visit | Attending: Oncology | Admitting: Oncology

## 2020-01-14 DIAGNOSIS — Z1231 Encounter for screening mammogram for malignant neoplasm of breast: Secondary | ICD-10-CM | POA: Diagnosis not present

## 2020-01-14 DIAGNOSIS — C50912 Malignant neoplasm of unspecified site of left female breast: Secondary | ICD-10-CM | POA: Diagnosis present

## 2020-01-14 DIAGNOSIS — Z853 Personal history of malignant neoplasm of breast: Secondary | ICD-10-CM | POA: Insufficient documentation

## 2020-02-03 ENCOUNTER — Encounter: Payer: Self-pay | Admitting: Nurse Practitioner

## 2020-02-03 ENCOUNTER — Other Ambulatory Visit: Payer: Self-pay

## 2020-02-03 ENCOUNTER — Ambulatory Visit (INDEPENDENT_AMBULATORY_CARE_PROVIDER_SITE_OTHER): Payer: Medicare PPO | Admitting: Nurse Practitioner

## 2020-02-03 VITALS — BP 137/79 | HR 86 | Temp 98.0°F | Wt 226.4 lb

## 2020-02-03 DIAGNOSIS — B354 Tinea corporis: Secondary | ICD-10-CM

## 2020-02-03 DIAGNOSIS — S30861A Insect bite (nonvenomous) of abdominal wall, initial encounter: Secondary | ICD-10-CM

## 2020-02-03 DIAGNOSIS — W57XXXA Bitten or stung by nonvenomous insect and other nonvenomous arthropods, initial encounter: Secondary | ICD-10-CM | POA: Diagnosis not present

## 2020-02-03 MED ORDER — DOXYCYCLINE HYCLATE 100 MG PO TABS
100.0000 mg | ORAL_TABLET | Freq: Two times a day (BID) | ORAL | 0 refills | Status: DC
Start: 2020-02-03 — End: 2020-04-27

## 2020-02-03 MED ORDER — NYSTATIN 100000 UNIT/GM EX POWD
1.0000 | Freq: Two times a day (BID) | CUTANEOUS | 4 refills | Status: DC
Start: 2020-02-03 — End: 2021-02-27

## 2020-02-03 NOTE — Assessment & Plan Note (Signed)
Acute x 48 hours since removal.  She has not symptoms at this time.  Area with erythema and irritation.  Will send in script for Doxycycline 100 MG BID x 10 days and patient to monitor area closely.  Return to office for worsening symptoms or ongoing rash.

## 2020-02-03 NOTE — Progress Notes (Signed)
BP 137/79   Pulse 86   Temp 98 F (36.7 C) (Oral)   Wt 226 lb 6.4 oz (102.7 kg)   SpO2 98%   BMI 38.86 kg/m    Subjective:    Patient ID: [name], female    DOB: 07/18/1951, 69 y.o.   MRN: [id number]  HPI: [name] is a 69 y.o. female  Chief Complaint  Patient presents with  . Insect Bite    pt states she had a tick on her stomach on 02/01/20, states it is red around the bite and has a rash under it    TICK BITE Noted tick on her on 02/01/2020.  She reports a rash is present, has not had a tick bite in many years. Duration: 2 days Location: stomach area Onset: 48 hours ago Itching: yes Status: stable Treatments attempted:  Fever: no Chills: no headache: no Muscle pain: no Rash: yes  Relevant past medical, surgical, family and social history reviewed and updated as indicated. Interim medical history since our last visit reviewed. Allergies and medications reviewed and updated.  Review of Systems  Constitutional: Negative for activity change, appetite change, diaphoresis, fatigue and fever.  Respiratory: Negative for cough, chest tightness and shortness of breath.   Cardiovascular: Negative for chest pain, palpitations and leg swelling.  Gastrointestinal: Negative.   Skin: Positive for rash.  Neurological: Negative.   Psychiatric/Behavioral: Negative.     Per HPI unless specifically indicated above     Objective:    BP 137/79   Pulse 86   Temp 98 F (36.7 C) (Oral)   Wt 226 lb 6.4 oz (102.7 kg)   SpO2 98%   BMI 38.86 kg/m   Wt Readings from Last 3 Encounters:  02/03/20 226 lb 6.4 oz (102.7 kg)  12/28/19 226 lb 3.2 oz (102.6 kg)  10/19/19 226 lb 4 oz (102.6 kg)    Physical Exam Vitals and nursing note reviewed.  Constitutional:      General: She is awake. She is not in acute distress.    Appearance: She is well-developed and well-groomed. She is obese. She is not ill-appearing.  HENT:     Head: Normocephalic.     Right Ear: Hearing normal.      Left Ear: Hearing normal.  Eyes:     General: Lids are normal.        Right eye: No discharge.        Left eye: No discharge.     Conjunctiva/sclera: Conjunctivae normal.     Pupils: Pupils are equal, round, and reactive to light.  Neck:     Vascular: No carotid bruit.  Cardiovascular:     Rate and Rhythm: Normal rate and regular rhythm.     Heart sounds: Normal heart sounds. No murmur heard.  No gallop.   Pulmonary:     Effort: Pulmonary effort is normal. No accessory muscle usage or respiratory distress.     Breath sounds: Normal breath sounds.  Abdominal:     General: Bowel sounds are normal.     Palpations: Abdomen is soft.  Musculoskeletal:     Cervical back: Normal range of motion and neck supple.     Right lower leg: No edema.     Left lower leg: No edema.  Skin:    General: Skin is warm and dry.          Comments: Under pannus mild erythema and raw appearance on right side.  Neurological:  Mental Status: She is alert and oriented to person, place, and time.  Psychiatric:        Attention and Perception: Attention normal.        Mood and Affect: Mood normal.        Speech: Speech normal.        Behavior: Behavior normal. Behavior is cooperative.        Thought Content: Thought content normal.     Results for orders placed or performed in visit on 12/28/19  Vitamin B12  Result Value Ref Range   Vitamin B-12 418 232 - 1,245 pg/mL  Bayer DCA Hb A1c Waived  Result Value Ref Range   HB A1C (BAYER DCA - WAIVED) 6.0 <7.0 %      Assessment & Plan:   Problem List Items Addressed This Visit      Musculoskeletal and Integument   Tinea corporis - Primary    Noted on exam under pannus R>L.  Will send in Nystatin powder.  Recommend drying well after showers or baths and keeping abdomen dry after being outside to avoid further irritation to area.  Use Nystatin powder as needed.      Relevant Medications   nystatin (MYCOSTATIN/NYSTOP) powder   Tick bite of  abdomen    Acute x 48 hours since removal.  She has not symptoms at this time.  Area with erythema and irritation.  Will send in script for Doxycycline 100 MG BID x 10 days and patient to monitor area closely.  Return to office for worsening symptoms or ongoing rash.          Follow up plan: Return if symptoms worsen or fail to improve.

## 2020-02-03 NOTE — Assessment & Plan Note (Signed)
Noted on exam under pannus R>L.  Will send in Nystatin powder.  Recommend drying well after showers or baths and keeping abdomen dry after being outside to avoid further irritation to area.  Use Nystatin powder as needed.

## 2020-02-03 NOTE — Patient Instructions (Signed)

## 2020-02-14 ENCOUNTER — Encounter: Payer: Self-pay | Admitting: Oncology

## 2020-02-15 ENCOUNTER — Other Ambulatory Visit: Payer: Self-pay | Admitting: Nurse Practitioner

## 2020-02-15 NOTE — Telephone Encounter (Signed)
Marland Kitchen Requested Prescriptions  Pending Prescriptions Disp Refills  . atorvastatin (LIPITOR) 40 MG tablet [Pharmacy Med Name: ATORVASTATIN 40 MG TABLET] 90 tablet 2    Sig: Take 1 tablet (40 mg total) by mouth daily.     Cardiovascular:  Antilipid - Statins Passed - 02/15/2020  1:09 PM      Passed - Total Cholesterol in normal range and within 360 days    Cholesterol, Total  Date Value Ref Range Status  09/27/2019 129 100 - 199 mg/dL Final   Cholesterol Piccolo, Waived  Date Value Ref Range Status  03/29/2019 145 <200 mg/dL Final    Comment:                            Desirable                <200                         Borderline High      200- 239                         High                     >239          Passed - LDL in normal range and within 360 days    LDL Chol Calc (NIH)  Date Value Ref Range Status  09/27/2019 61 0 - 99 mg/dL Final         Passed - HDL in normal range and within 360 days    HDL  Date Value Ref Range Status  09/27/2019 47 >39 mg/dL Final         Passed - Triglycerides in normal range and within 360 days    Triglycerides  Date Value Ref Range Status  09/27/2019 120 0 - 149 mg/dL Final   Triglycerides Piccolo,Waived  Date Value Ref Range Status  03/29/2019 124 <150 mg/dL Final    Comment:                            Normal                   <150                         Borderline High     150 - 199                         High                200 - 499                         Very High                >499          Passed - Patient is not pregnant      Passed - Valid encounter within last 12 months    Recent Outpatient Visits          1 week ago Tinea corporis   [place], [place] T, NP   1 month ago Diabetes mellitus associated with hormonal etiology ([place])   [facility] [name], [name], NP   4 months ago Annual physical exam   [place], [name], NP   8 months ago Acute  non-recurrent maxillary sinusitis   [facility] [place], [place] T, NP   11 months ago Trigger finger, right middle finger   [place] [facility], Jeannette How, MD      Future Appointments            In 2 months [name], [name], NP [name]

## 2020-02-29 ENCOUNTER — Inpatient Hospital Stay: Payer: Medicare PPO

## 2020-03-03 ENCOUNTER — Other Ambulatory Visit: Payer: Self-pay

## 2020-03-06 ENCOUNTER — Other Ambulatory Visit: Payer: Self-pay | Admitting: Emergency Medicine

## 2020-03-07 ENCOUNTER — Inpatient Hospital Stay: Payer: Medicare PPO

## 2020-03-07 ENCOUNTER — Other Ambulatory Visit: Payer: Self-pay

## 2020-03-07 ENCOUNTER — Inpatient Hospital Stay: Payer: Medicare PPO | Attending: Oncology

## 2020-03-07 LAB — MAGNESIUM: Magnesium: 1.2 mg/dL — ABNORMAL LOW (ref 1.7–2.4)

## 2020-03-07 MED ORDER — SODIUM CHLORIDE 0.9 % IV SOLN
INTRAVENOUS | Status: DC
  Filled 2020-03-07 (×2): qty 250

## 2020-03-07 MED ORDER — MAGNESIUM SULFATE 4 GM/100ML IV SOLN
4.0000 g | Freq: Once | INTRAVENOUS | Status: AC
  Administered 2020-03-07: 4 g via INTRAVENOUS
  Filled 2020-03-07: qty 100

## 2020-04-17 ENCOUNTER — Ambulatory Visit: Payer: Medicare PPO | Admitting: Nurse Practitioner

## 2020-04-27 ENCOUNTER — Encounter: Payer: Self-pay | Admitting: Nurse Practitioner

## 2020-04-27 ENCOUNTER — Ambulatory Visit
Admission: RE | Admit: 2020-04-27 | Discharge: 2020-04-27 | Disposition: A | Discharge status: Home / Self Care | Payer: Medicare PPO | Attending: Nurse Practitioner | Admitting: Nurse Practitioner

## 2020-04-27 ENCOUNTER — Ambulatory Visit
Admission: RE | Admit: 2020-04-27 | Discharge: 2020-04-27 | Disposition: A | Discharge status: Home / Self Care | Payer: Medicare PPO | Source: Ambulatory Visit | Attending: Nurse Practitioner | Admitting: Nurse Practitioner

## 2020-04-27 ENCOUNTER — Ambulatory Visit (INDEPENDENT_AMBULATORY_CARE_PROVIDER_SITE_OTHER): Payer: Medicare PPO | Admitting: Nurse Practitioner

## 2020-04-27 ENCOUNTER — Other Ambulatory Visit: Payer: Self-pay

## 2020-04-27 VITALS — BP 117/73 | HR 73 | Temp 97.9°F | Wt 222.4 lb

## 2020-04-27 DIAGNOSIS — G8929 Other chronic pain: Secondary | ICD-10-CM | POA: Insufficient documentation

## 2020-04-27 DIAGNOSIS — T451X5A Adverse effect of antineoplastic and immunosuppressive drugs, initial encounter: Secondary | ICD-10-CM

## 2020-04-27 DIAGNOSIS — M25572 Pain in left ankle and joints of left foot: Secondary | ICD-10-CM

## 2020-04-27 DIAGNOSIS — E1159 Type 2 diabetes mellitus with other circulatory complications: Secondary | ICD-10-CM

## 2020-04-27 DIAGNOSIS — G620 Drug-induced polyneuropathy: Secondary | ICD-10-CM | POA: Diagnosis not present

## 2020-04-27 DIAGNOSIS — Z23 Encounter for immunization: Secondary | ICD-10-CM

## 2020-04-27 DIAGNOSIS — E669 Obesity, unspecified: Secondary | ICD-10-CM | POA: Insufficient documentation

## 2020-04-27 DIAGNOSIS — Z950 Presence of cardiac pacemaker: Secondary | ICD-10-CM

## 2020-04-27 DIAGNOSIS — I10 Essential (primary) hypertension: Secondary | ICD-10-CM

## 2020-04-27 DIAGNOSIS — E1169 Type 2 diabetes mellitus with other specified complication: Secondary | ICD-10-CM | POA: Diagnosis not present

## 2020-04-27 DIAGNOSIS — E785 Hyperlipidemia, unspecified: Secondary | ICD-10-CM

## 2020-04-27 LAB — BAYER DCA HB A1C WAIVED: HB A1C (BAYER DCA - WAIVED): 5.9 % (ref <7.0)

## 2020-04-27 NOTE — Assessment & Plan Note (Signed)
Chronic, ongoing with A1C today 5.9%. Last urine micro ALB 10 and A:C <30. Continue current medication regimen as A1C at goal and no hypoglycemia at home. If continued downward trend may consider reduction of medication.  Continue to monitor BS at home daily.   Return in 3 months for visit.

## 2020-04-27 NOTE — Progress Notes (Signed)
BP 117/73   Pulse 73   Temp 97.9 F (36.6 C) (Oral)   Wt 222 lb 6.4 oz (100.9 kg)   SpO2 96%   BMI 38.17 kg/m    Subjective:    Patient ID: [name], female    DOB: 1950-09-29, 69 y.o.   MRN: [id number]  HPI: [name] is a 69 y.o. female  Chief Complaint  Patient presents with  . Diabetes    [facility] for [facility] exam   . Hyperlipidemia  . Hypertension   DIABETES Continues on Metformin 500 MG BID.  Last A1C in May 6%.  Her B12 level was 418 on recent check. Hypoglycemic episodes:no Polydipsia/polyuria: no Visual disturbance: no Chest pain: no Paresthesias: no Glucose Monitoring: yes             Accucheck frequency: Daily             Fasting glucose: on average 110-120 -- 122 this AM              Post prandial:             Evening:             Before meals: Taking Insulin?: no             Long acting insulin:             Short acting insulin: Blood Pressure Monitoring: daily Retinal Examination: Up to Date Foot Exam: Up to Date Pneumovax: Up to Date Influenza: Up to Date Aspirin: yes   HYPERTENSION / HYPERLIPIDEMIA Has pacemaker, PPM [employer].  Followed by cardiology and last seen 10/19/2019, note reviewed and no changes made.  Has 4 more years on current pacemaker, this is pacemaker #2. Satisfied with current treatment? yes Duration of hypertension: chronic BP monitoring frequency: daily BP range: 110/70 range at home BP medication side effects: no Duration of hyperlipidemia: chronic Cholesterol medication side effects: no Cholesterol supplements: none Medication compliance: good compliance Aspirin: no Recent stressors: no Recurrent headaches: no Visual changes: no Palpitations: no Dyspnea: no Chest pain: no Lower extremity edema: no Dizzy/lightheaded: no   HISTORY OF BREAST CA: Has ongoing neuropathy and hypomagnesemia, is followed by oncology. Receives magnesium infusions. Takes Lyrica for neuropathy pain. Still has uterus.   Continues to have ongoing hot flashes for the past year, all through the day -- she does not wish to start any medications for this. Last mammogram 01/14/20.   ANKLE PAIN Currently this is throwing off her gait and causing weakness and limping at times. 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 -- DEXA scan in 2015 was normal Involved foot: left Mechanism of injury: past trauma and surgery Location: diffuse -- she reports low strength to ankle Onset: gradual  Severity: 10/10 at worst and 0/10 best Quality:  Daily pain is dull aching, but then it will get to throbbing Frequency: constant with intermittent sharper pains Radiation: no Aggravating factors: weight bearing, walking and movement  Alleviating factors: braces for short period Status: fluctuating Treatments attempted: braces, [place], Aleeve  Relief with NSAIDs?:  moderate Weakness with weight bearing or walking: yes Morning stiffness: yes Swelling: yes Redness: no Bruising: no Paresthesias / decreased sensation: no  Fevers:no  Relevant past medical, surgical, family and social history reviewed and updated as indicated. Interim medical history since our last visit reviewed. Allergies and medications reviewed and updated.  Review of Systems  Constitutional: Negative for activity change, appetite change, diaphoresis, fatigue and fever.  Respiratory: Negative for cough, chest tightness and shortness of breath.   Cardiovascular: Negative for chest pain, palpitations and leg swelling.  Gastrointestinal: Negative.   Musculoskeletal: Positive for arthralgias.  Neurological: Negative.   Psychiatric/Behavioral: Negative.     Per HPI unless specifically indicated above     Objective:    BP 117/73   Pulse 73   Temp 97.9 F (36.6 C) (Oral)   Wt 222 lb 6.4 oz (100.9 kg)   SpO2 96%   BMI 38.17 kg/m   Wt Readings from Last 3  Encounters:  04/27/20 222 lb 6.4 oz (100.9 kg)  02/03/20 226 lb 6.4 oz (102.7 kg)  12/28/19 226 lb 3.2 oz (102.6 kg)    Physical Exam Vitals and nursing note reviewed.  Constitutional:      General: She is awake. She is not in acute distress.    Appearance: She is well-developed and well-groomed. She is morbidly obese. She is not ill-appearing.  HENT:     Head: Normocephalic.     Right Ear: Hearing normal.     Left Ear: Hearing normal.  Eyes:     General: Lids are normal.        Right eye: No discharge.        Left eye: No discharge.     Conjunctiva/sclera: Conjunctivae normal.     Pupils: Pupils are equal, round, and reactive to light.  Neck:     Thyroid: No thyromegaly.     Vascular: No carotid bruit.  Cardiovascular:     Rate and Rhythm: Normal rate and regular rhythm.     Heart sounds: Normal heart sounds. No murmur heard.  No gallop.   Pulmonary:     Effort: Pulmonary effort is normal. No accessory muscle usage or respiratory distress.     Breath sounds: Normal breath sounds.  Abdominal:     General: Bowel sounds are normal.     Palpations: Abdomen is soft.  Musculoskeletal:     Cervical back: Normal range of motion and neck supple.     Right lower leg: No edema.     Left lower leg: No edema.     Right ankle: Normal.     Right Achilles Tendon: No tenderness.     Left ankle: Swelling (mild diffuse) and deformity (slight curvature of foot outwards) present. No ecchymosis or lacerations. Tenderness (diffuse to ankle) present. Normal range of motion. Anterior drawer test negative. Normal pulse.     Left Achilles Tendon: No tenderness.  Skin:    General: Skin is warm and dry.  Neurological:     Mental Status: She is alert and oriented to person, place, and time.  Psychiatric:        Attention and Perception: Attention normal.        Mood and Affect: Mood normal.        Speech: Speech normal.        Behavior: Behavior normal. Behavior is cooperative.        Thought  Content: Thought content normal.    Diabetic Foot Exam - Simple   Simple Foot Form Visual Inspection No deformities, no ulcerations, no other skin breakdown bilaterally: Yes Sensation Testing Intact to touch and monofilament testing bilaterally: Yes Pulse Check Posterior Tibialis and Dorsalis pulse intact bilaterally: Yes Comments     Results for orders placed or performed in visit on 03/07/20  Magnesium  Result Value Ref  Range   Magnesium 1.2 (L) 1.7 - 2.4 mg/dL      Assessment & Plan:   Problem List Items Addressed This Visit      Cardiovascular and Mediastinum   Hypertension associated with diabetes ([place])    Chronic, ongoing.  Continue current medication regimen and adjust as needed + continue collaboration with cardiology.  BMP today.  Recommend she continue to monitor BP at home and focus on DASH diet.  Return in 3 months.      Relevant Orders   Bayer DCA Hb A1c Waived     Endocrine   Diabetes mellitus associated with hormonal etiology ([place])    Chronic, ongoing with A1C today 5.9%. Last urine micro ALB 10 and A:C <30. Continue current medication regimen as A1C at goal and no hypoglycemia at home. If continued downward trend may consider reduction of medication.  Continue to monitor BS at home daily.   Return in 3 months for visit.      Relevant Orders   Bayer DCA Hb A1c Waived   Basic metabolic panel   Hyperlipidemia associated with type 2 diabetes mellitus ([place]) - Primary    Chronic, ongoing.  Continue current medication regimen and adjust as needed.  Lipid panel today.      Relevant Orders   Bayer DCA Hb A1c Waived   Lipid Panel w/o Chol/HDL Ratio     Nervous and Auditory   Chemotherapy-induced neuropathy (HCC)    Chronic, ongoing.  Continue Lyrica and adjust as needed, especially with aging monitor dose closely.  Return in 3 months.        Other   PPM-[employer]    Continue collaboration with cardiology for checks.      Hypomagnesemia     Continue collaboration with oncology and infusions as needed + Amiloride as ordered by nephrology.      Chronic pain of left ankle    Ongoing with some worsening and gait disturbance.  Will obtain imaging of ankle and referral to ortho for further recommendations, may benefit from PT.  Continue OTC medications as needed and brace for comfort.      Relevant Orders   DG Ankle Complete Left   Ambulatory referral to Orthopedics   Morbid obesity ([place])    BMI 38.17 with T2DM and HTN.  Recommended eating smaller high protein, low fat meals more frequently and exercising 30 mins a day 5 times a week with a goal of 10-15lb weight loss in the next 3 months. Patient voiced their understanding and motivation to adhere to these recommendations.        Other Visit Diagnoses    Need for influenza vaccination       Relevant Orders   Flu Vaccine QUAD High Dose(Fluad) (Completed)       Follow up plan: Return in about 3 months (around 07/27/2020) for T2DM, HTN/HLD.

## 2020-04-27 NOTE — Patient Instructions (Signed)

## 2020-04-27 NOTE — Assessment & Plan Note (Signed)
Continue collaboration with oncology and infusions as needed + Amiloride as ordered by nephrology.

## 2020-04-27 NOTE — Assessment & Plan Note (Signed)
Ongoing with some worsening and gait disturbance.  Will obtain imaging of ankle and referral to ortho for further recommendations, may benefit from PT.  Continue OTC medications as needed and brace for comfort.

## 2020-04-27 NOTE — Assessment & Plan Note (Signed)
Chronic, ongoing.  Continue current medication regimen and adjust as needed + continue collaboration with cardiology.  BMP today.  Recommend she continue to monitor BP at home and focus on DASH diet.  Return in 3 months.

## 2020-04-27 NOTE — Assessment & Plan Note (Signed)
Chronic, ongoing.  Continue Lyrica and adjust as needed, especially with aging monitor dose closely.  Return in 3 months.

## 2020-04-27 NOTE — Assessment & Plan Note (Signed)
Chronic, ongoing.  Continue current medication regimen and adjust as needed. Lipid panel today. 

## 2020-04-27 NOTE — Assessment & Plan Note (Signed)
Continue collaboration with cardiology for checks. 

## 2020-04-27 NOTE — Assessment & Plan Note (Signed)
BMI 38.17 with T2DM and HTN.  Recommended eating smaller high protein, low fat meals more frequently and exercising 30 mins a day 5 times a week with a goal of 10-15lb weight loss in the next 3 months. Patient voiced their understanding and motivation to adhere to these recommendations.

## 2020-04-28 LAB — BASIC METABOLIC PANEL
BUN/Creatinine Ratio: 25 (ref 12–28)
BUN: 19 mg/dL (ref 8–27)
CO2: 26 mmol/L (ref 20–29)
Calcium: 9.5 mg/dL (ref 8.7–10.3)
Chloride: 105 mmol/L (ref 96–106)
Creatinine, Ser: 0.76 mg/dL (ref 0.57–1.00)
GFR calc Af Amer: 92 mL/min/{1.73_m2} (ref >59)
GFR calc non Af Amer: 80 mL/min/{1.73_m2} (ref >59)
Glucose: 107 mg/dL — ABNORMAL HIGH (ref 65–99)
Potassium: 4.5 mmol/L (ref 3.5–5.2)
Sodium: 144 mmol/L (ref 134–144)

## 2020-04-28 LAB — LIPID PANEL W/O CHOL/HDL RATIO
Cholesterol, Total: 150 mg/dL (ref 100–199)
HDL: 41 mg/dL (ref >39)
LDL Chol Calc (NIH): 85 mg/dL (ref 0–99)
Triglycerides: 136 mg/dL (ref 0–149)
VLDL Cholesterol Cal: 24 mg/dL (ref 5–40)

## 2020-04-28 NOTE — Progress Notes (Signed)
Contacted via MyChart  Good morning [name], so your imaging came back and I am glad we have ortho referral.  There are some degenerative changes present, arthritis as we expected.  However, it looks like there may be loosening of one of the screws from past surgery, which would explain some of the issues you are having.  We will see what ortho says, but please alert them this imaging was done.  Thank you.  Any questions?

## 2020-04-28 NOTE — Progress Notes (Signed)
Contacted via MyChart  Good afternoon [name], your labs have returned and overall look great.  If any questions let me know.  Have a wonderful weekend. Keep being awesome!!  Thank you for allowing me to participate in your care. Kindest regards, Matraca Hunkins

## 2020-05-12 ENCOUNTER — Other Ambulatory Visit: Payer: Self-pay | Admitting: Nurse Practitioner

## 2020-05-12 DIAGNOSIS — G620 Drug-induced polyneuropathy: Secondary | ICD-10-CM

## 2020-05-12 NOTE — Telephone Encounter (Signed)
Requested medication (s) are due for refill today: no  Requested medication (s) are on the active medication list:yes    Future visit scheduled: yes   Notes to clinic:  this refill cannot be delegated    Requested Prescriptions  Pending Prescriptions Disp Refills   pregabalin (LYRICA) 50 MG capsule [Pharmacy Med Name: PREGABALIN 50 MG CAPSULE] 270 capsule 0    Sig: Take 1 capsule (50 mg total) by mouth 3 (three) times daily.      Not Delegated - Neurology:  Anticonvulsants - Controlled Failed - 05/12/2020 12:07 PM      Failed - This refill cannot be delegated      Passed - Valid encounter within last 12 months    Recent Outpatient Visits           2 weeks ago Hyperlipidemia associated with type 2 diabetes mellitus ([place])   [place] [name], [name], NP   3 months ago Tinea corporis   [place], [place] T, NP   4 months ago Diabetes mellitus associated with hormonal etiology ([place])   [place] [name], [name], NP   7 months ago Annual physical exam   [place] [name], [name], NP   11 months ago Acute non-recurrent maxillary sinusitis   [place], [name], NP       Future Appointments             In 3 months [name], [name], NP [name]

## 2020-05-12 NOTE — Telephone Encounter (Signed)
Routing to provider  

## 2020-06-12 ENCOUNTER — Ambulatory Visit: Payer: Medicare PPO

## 2020-06-23 ENCOUNTER — Ambulatory Visit (INDEPENDENT_AMBULATORY_CARE_PROVIDER_SITE_OTHER): Payer: Medicare PPO

## 2020-06-23 VITALS — Ht 63.5 in | Wt 219.8 lb

## 2020-06-23 DIAGNOSIS — Z0000 Encounter for general adult medical examination without abnormal findings: Secondary | ICD-10-CM

## 2020-06-23 NOTE — Patient Instructions (Signed)
[name] , Thank you for taking time to come for your Medicare Wellness Visit. I appreciate your ongoing commitment to your health goals. Please review the following plan we discussed and let me know if I can assist you in the future.   Screening recommendations/referrals: Colonoscopy: cologuard 10/18/2019, due 10/17/2021 Mammogram: completed 01/14/2020, due 01/13/2021 Bone Density: completed 08/17/2014 Recommended yearly ophthalmology/optometry visit for glaucoma screening and checkup Recommended yearly dental visit for hygiene and checkup  Vaccinations: Influenza vaccine: completed 04/27/2020, due 03/26/2021 Pneumococcal vaccine: completed 03/16/2018 Tdap vaccine: completed 05/22/2011 Shingles vaccine: second dose in December   Covid-19: 11/03/2019, 10/13/2019  Advanced directives: Please bring a copy of your POA (Power of Attorney) and/or Living Will to your next appointment.   Conditions/risks identified: none  Next appointment: Follow up in one year for your annual wellness visit    Preventive Care 65 Years and Older, Female Preventive care refers to lifestyle choices and visits with your health care provider that can promote health and wellness. What does preventive care include?  A yearly physical exam. This is also called an annual well check.  Dental exams once or twice a year.  Routine eye exams. Ask your health care provider how often you should have your eyes checked.  Personal lifestyle choices, including:  Daily care of your teeth and gums.  Regular physical activity.  Eating a healthy diet.  Avoiding tobacco and drug use.  Limiting alcohol use.  Practicing safe sex.  Taking low-dose aspirin every day.  Taking vitamin and mineral supplements as recommended by your health care provider. What happens during an annual well check? The services and screenings done by your health care provider during your annual well check will depend on your age, overall health,  lifestyle risk factors, and family history of disease. Counseling  Your health care provider may ask you questions about your:  Alcohol use.  Tobacco use.  Drug use.  Emotional well-being.  Home and relationship well-being.  Sexual activity.  Eating habits.  History of falls.  Memory and ability to understand (cognition).  Work and work Statistician.  Reproductive health. Screening  You may have the following tests or measurements:  Height, weight, and BMI.  Blood pressure.  Lipid and cholesterol levels. These may be checked every 5 years, or more frequently if you are over 81 years old.  Skin check.  Lung cancer screening. You may have this screening every year starting at age 7 if you have a 30-pack-year history of smoking and currently smoke or have quit within the past 15 years.  Fecal occult blood test (FOBT) of the stool. You may have this test every year starting at age 26.  Flexible sigmoidoscopy or colonoscopy. You may have a sigmoidoscopy every 5 years or a colonoscopy every 10 years starting at age 54.  Hepatitis C blood test.  Hepatitis B blood test.  Sexually transmitted disease (STD) testing.  Diabetes screening. This is done by checking your blood sugar (glucose) after you have not eaten for a while (fasting). You may have this done every 1-3 years.  Bone density scan. This is done to screen for osteoporosis. You may have this done starting at age 30.  Mammogram. This may be done every 1-2 years. Talk to your health care provider about how often you should have regular mammograms. Talk with your health care provider about your test results, treatment options, and if necessary, the need for more tests. Vaccines  Your health care provider may recommend certain vaccines,  such as:  Influenza vaccine. This is recommended every year.  Tetanus, diphtheria, and acellular pertussis (Tdap, Td) vaccine. You may need a Td booster every 10 years.  Zoster  vaccine. You may need this after age 29.  Pneumococcal 13-valent conjugate (PCV13) vaccine. One dose is recommended after age 77.  Pneumococcal polysaccharide (PPSV23) vaccine. One dose is recommended after age 20. Talk to your health care provider about which screenings and vaccines you need and how often you need them. This information is not intended to replace advice given to you by your health care provider. Make sure you discuss any questions you have with your health care provider. Document Released: 09/08/2015 Document Revised: 05/01/2016 Document Reviewed: 06/13/2015 Elsevier Interactive Patient Education  2017 [place] Prevention in the Home Falls can cause injuries. They can happen to people of all ages. There are many things you can do to make your home safe and to help prevent falls. What can I do on the outside of my home?  Regularly fix the edges of walkways and driveways and fix any cracks.  Remove anything that might make you trip as you walk through a door, such as a raised step or threshold.  Trim any bushes or trees on the path to your home.  Use bright outdoor lighting.  Clear any walking paths of anything that might make someone trip, such as rocks or tools.  Regularly check to see if handrails are loose or broken. Make sure that both sides of any steps have handrails.  Any raised decks and porches should have guardrails on the edges.  Have any leaves, snow, or ice cleared regularly.  Use sand or salt on walking paths during winter.  Clean up any spills in your garage right away. This includes oil or grease spills. What can I do in the bathroom?  Use night lights.  Install grab bars by the toilet and in the tub and shower. Do not use towel bars as grab bars.  Use non-skid mats or decals in the tub or shower.  If you need to sit down in the shower, use a plastic, non-slip stool.  Keep the floor dry. Clean up any water that spills on the  floor as soon as it happens.  Remove soap buildup in the tub or shower regularly.  Attach bath mats securely with double-sided non-slip rug tape.  Do not have throw rugs and other things on the floor that can make you trip. What can I do in the bedroom?  Use night lights.  Make sure that you have a light by your bed that is easy to reach.  Do not use any sheets or blankets that are too big for your bed. They should not hang down onto the floor.  Have a firm chair that has side arms. You can use this for support while you get dressed.  Do not have throw rugs and other things on the floor that can make you trip. What can I do in the kitchen?  Clean up any spills right away.  Avoid walking on wet floors.  Keep items that you use a lot in easy-to-reach places.  If you need to reach something above you, use a strong step stool that has a grab bar.  Keep electrical cords out of the way.  Do not use floor polish or wax that makes floors slippery. If you must use wax, use non-skid floor wax.  Do not have throw rugs and other things on  the floor that can make you trip. What can I do with my stairs?  Do not leave any items on the stairs.  Make sure that there are handrails on both sides of the stairs and use them. Fix handrails that are broken or loose. Make sure that handrails are as long as the stairways.  Check any carpeting to make sure that it is firmly attached to the stairs. Fix any carpet that is loose or worn.  Avoid having throw rugs at the top or bottom of the stairs. If you do have throw rugs, attach them to the floor with carpet tape.  Make sure that you have a light switch at the top of the stairs and the bottom of the stairs. If you do not have them, ask someone to add them for you. What else can I do to help prevent falls?  Wear shoes that:  Do not have high heels.  Have rubber bottoms.  Are comfortable and fit you well.  Are closed at the toe. Do not wear  sandals.  If you use a stepladder:  Make sure that it is fully opened. Do not climb a closed stepladder.  Make sure that both sides of the stepladder are locked into place.  Ask someone to hold it for you, if possible.  Clearly mark and make sure that you can see:  Any grab bars or handrails.  First and last steps.  Where the edge of each step is.  Use tools that help you move around (mobility aids) if they are needed. These include:  Canes.  Walkers.  Scooters.  Crutches.  Turn on the lights when you go into a dark area. Replace any light bulbs as soon as they burn out.  Set up your furniture so you have a clear path. Avoid moving your furniture around.  If any of your floors are uneven, fix them.  If there are any pets around you, be aware of where they are.  Review your medicines with your doctor. Some medicines can make you feel dizzy. This can increase your chance of falling. Ask your doctor what other things that you can do to help prevent falls. This information is not intended to replace advice given to you by your health care provider. Make sure you discuss any questions you have with your health care provider. Document Released: 06/08/2009 Document Revised: 01/18/2016 Document Reviewed: 09/16/2014 Elsevier Interactive Patient Education  2017 [employer].

## 2020-06-23 NOTE — Progress Notes (Signed)
I connected with [name] today by telephone and verified that I am speaking with the correct person using two identifiers. Location patient: home Location provider: work Persons participating in the virtual visit: Reesa Gotschall, Glenna Durand LPN.   I discussed the limitations, risks, security and privacy concerns of performing an evaluation and management service by telephone and the availability of in person appointments. I also discussed with the patient that there may be a patient responsible charge related to this service. The patient expressed understanding and verbally consented to this telephonic visit.    Interactive audio and video telecommunications were attempted between this provider and patient, however failed, due to patient having technical difficulties OR patient did not have access to video capability.  We continued and completed visit with audio only.     Vital signs may be patient reported or missing.  Subjective:   [name] is a 69 y.o. female who presents for Medicare Annual (Subsequent) preventive examination.  Review of Systems     Cardiac Risk Factors include: advanced age (>3mn, >>40women);diabetes mellitus;dyslipidemia;hypertension;obesity (BMI >30kg/m2);sedentary lifestyle     Objective:    Today's Vitals   06/23/20 1527  Weight: 219 lb 12.8 oz (99.7 kg)  Height: 5' 3.5" (1.613 m)   Body mass index is 38.33 kg/m.  Advanced Directives 06/23/2020 06/25/2019 03/29/2019 06/29/2018 03/16/2018 06/23/2017 06/24/2016  Does Patient Have a Medical Advance Directive? Yes Yes Yes No No No No  Type of AParamedicof ABon AirLiving will Living will;Healthcare Power of Attorney Living will;Healthcare Power of Attorney - - - -  Copy of HHendronin Chart? No - copy requested - No - copy requested - - - -  Would patient like information on creating a medical advance directive? - - - - Yes (MAU/Ambulatory/Procedural Areas -  Information given) - No - patient declined information    Current Medications (verified) Outpatient Encounter Medications as of 06/23/2020  Medication Sig  . aMILoride (MIDAMOR) 5 MG tablet Take 1 tablet (5 mg total) by mouth daily.  .Marland Kitchenatenolol (TENORMIN) 50 MG tablet Take 1 tablet (50 mg total) by mouth daily.  .Marland Kitchenatorvastatin (LIPITOR) 40 MG tablet Take 1 tablet (40 mg total) by mouth daily.  . chlorpheniramine (CHLOR-TRIMETON) 4 MG tablet Take 4 mg by mouth every 6 (six) hours as needed for allergies.  .Marland KitchenCINNAMON PO Take by mouth daily.  .Marland Kitchenesomeprazole (NEXIUM) 20 MG capsule Take 20 mg by mouth daily.  . fluticasone (FLONASE) 50 MCG/ACT nasal spray Place 2 sprays into both nostrils daily.  .Marland Kitchenglucose blood (ACCU-CHEK AVIVA PLUS) test strip TEST BLOOD SUGAR ONCE DAILY  . magnesium oxide (MAG-OX) 400 MG tablet Take 400 mg by mouth 2 (two) times daily.  . melatonin 3 MG TABS tablet Take 3 mg by mouth at bedtime.  . meloxicam (MOBIC) 15 MG tablet Take 1 tablet by mouth daily.  . metFORMIN (GLUCOPHAGE) 500 MG tablet Take 1 tablet (500 mg total) by mouth 2 (two) times daily with a meal.  . nystatin (MYCOSTATIN/NYSTOP) powder Apply 1 application topically 2 (two) times daily.  . pregabalin (LYRICA) 50 MG capsule Take 1 capsule (50 mg total) by mouth 3 (three) times daily.  . SUMAtriptan (IMITREX) 100 MG tablet Take 1 tablet (100 mg total) by mouth as needed.  . cetirizine (ZYRTEC) 5 MG tablet Take 5 mg by mouth as needed for allergies. Seasonal allergies  . naproxen sodium (ANAPROX) 220 MG tablet Take 220 mg  by mouth 2 (two) times daily with a meal.   No facility-administered encounter medications on file as of 06/23/2020.    Allergies (verified) Penicillins and Levofloxacin   History: Past Medical History:  Diagnosis Date  . Breast CA ([place])    Chemo/XRT/lumpectomy  . Breast cancer ([place]) 09/2007   left breast, radiation, chemo  . Cardiomyopathy secondary   . Hypersomnolence 09/21/2013    . Leg edema, left 09/21/2013  . 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 Pacemaker [employer]   . Sinus tachycardia   . Type 2 diabetes mellitus ([place]) 06/19/2015   Past Surgical History:  Procedure Laterality Date  . 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 BREAST LUMPECTOMY    . CESAREAN SECTION  1983  . EYE SURGERY Left 12/2018   cataracts-Dr.Shah in [place]   . EYE SURGERY Right 01/2019   cataracts- Dr.Shah in [place]  . [place]  . INSERT / REPLACE / REMOVE PACEMAKER  2011   revision @ [facility]  05/13/2003   Guidant Rodman Comp - DR   Family History  Problem Relation Age of Onset  . Diabetes Mother   . Heart disease Mother   . Thyroid disease Mother   . Hypertension Father   . Cancer Father   . Alcohol abuse Father   . Diabetes Father   . Stroke Other        Family hx of CVA or stroke  . Diabetes Brother   . Diabetes Daughter   . Diabetes Paternal Uncle   . Diabetes Paternal Grandmother   . Breast cancer Maternal Aunt    Social History   Socioeconomic History  . Marital status: Divorced    Spouse name: Not on file  . Number of children: Not on file  . Years of education: Not on file  . Highest education level: Bachelor's degree (e.g., BA, AB, BS)  Occupational History  . Occupation: Full time    Employer: [employer]  Tobacco Use  . Smoking status: Former Smoker    Quit date: 08/26/1981    Years since quitting: 38.8  . Smokeless tobacco: Never Used  Vaping Use  . Vaping Use: Never used  Substance and Sexual Activity  . Alcohol use: Not Currently  . Drug use: No  . Sexual activity: Not on file  Other Topics Concern  . Not on file  Social History Narrative   Divorced   Gets regular exercise   Social Determinants of Health   Financial Resource Strain: Low Risk   . Difficulty of  Paying Living Expenses: Not hard at all  Food Insecurity: No Food Insecurity  . Worried About Charity fundraiser in the Last Year: Never true  . Ran Out of Food in the Last Year: Never true  Transportation Needs: No Transportation Needs  . Lack of Transportation (Medical): No  . Lack of Transportation (Non-Medical): No  Physical Activity: Inactive  . Days of Exercise per Week: 0 days  . Minutes of Exercise per Session: 0 min  Stress: No Stress Concern Present  . Feeling of Stress : Not at all  Social Connections:   . Frequency of Communication with Friends and Family: Not on file  . Frequency of Social Gatherings with Friends and Family: Not on file  . Attends Religious Services: Not on file  . Active Member  of Clubs or Organizations: Not on file  . Attends Archivist Meetings: Not on file  . Marital Status: Not on file    Tobacco Counseling Counseling given: Not Answered   Clinical Intake:  Pre-visit preparation completed: Yes  Pain : No/denies pain     Nutritional Status: BMI > 30  Obese Nutritional Risks: None Diabetes: Yes  How often do you need to have someone help you when you read instructions, pamphlets, or other written materials from your doctor or pharmacy?: 1 - Never What is the last grade level you completed in school?: BS  Diabetic? Yes Nutrition Risk Assessment:  Has the patient had any N/V/D within the last 2 months?  No  Does the patient have any non-healing wounds?  No  Has the patient had any unintentional weight loss or weight gain?  No   Diabetes:  Is the patient diabetic?  Yes  If diabetic, was a CBG obtained today?  No  Did the patient bring in their glucometer from home?  No  How often do you monitor your CBG's? daily.   Financial Strains and Diabetes Management:  Are you having any financial strains with the device, your supplies or your medication? No .  Does the patient want to be seen by Chronic Care Management for  management of their diabetes?  No  Would the patient like to be referred to a Nutritionist or for Diabetic Management?  No   Diabetic Exams:  Diabetic Eye Exam: Completed 12/2019 Diabetic Foot Exam: Completed 04/27/2020   Interpreter Needed?: No  Information entered by :: NAllen LPN   Activities of Daily Living In your present state of health, do you have any difficulty performing the following activities: 06/23/2020 09/27/2019  Hearing? N Y  Vision? N N  Difficulty concentrating or making decisions? N N  Walking or climbing stairs? N N  Dressing or bathing? N N  Doing errands, shopping? N N  Preparing Food and eating ? N -  Using the Toilet? N -  In the past six months, have you accidently leaked urine? Y -  Do you have problems with loss of bowel control? N -  Managing your Medications? N -  Managing your Finances? N -  Housekeeping or managing your Housekeeping? N -  Some recent data might be hidden    Patient Care Team: Guadalupe Maple, MD as PCP - General (Family Medicine) Lloyd Huger, MD as Consulting Physician (Oncology) Deboraha Sprang, MD as Consulting Physician (Cardiology)  Indicate any recent Medical Services you may have received from other than [facility] providers in the past year (date may be approximate).     Assessment:   This is a routine wellness examination for [name].  Hearing/Vision screen  Hearing Screening   '125Hz'  '250Hz'  '500Hz'  '1000Hz'  '2000Hz'  '3000Hz'  '4000Hz'  '6000Hz'  '8000Hz'   Right ear:           Left ear:           Vision Screening Comments: Regular eye exams, [facility]  Dietary issues and exercise activities discussed: Current Exercise Habits: The patient does not participate in regular exercise at present  Goals    . DIET - INCREASE WATER INTAKE     Recommend drinking at least 6-8 glasses of water a day     . Patient Stated     06/23/2020, wants to get below 200 pounds, wants to be steadier      Depression Screen PHQ 2/9 Scores  06/23/2020 09/27/2019  03/29/2019 03/16/2018 09/09/2017 02/24/2017 10/07/2016  PHQ - 2 Score 0 0 0 0 0 0 0  PHQ- 9 Score 0 - - 2 - - -    Fall Risk Fall Risk  06/23/2020 09/27/2019 03/29/2019 06/22/2018 03/16/2018  Falls in the past year? 0 0 0 No No  Number falls in past yr: - 0 - - -  Injury with Fall? - 0 - - -  Risk for fall due to : Impaired balance/gait;Medication side effect - - - -  Follow up Falls evaluation completed;Education provided;Falls prevention discussed Falls evaluation completed - - -    Any stairs in or around the home? Yes  If so, are there any without handrails? No  Home free of loose throw rugs in walkways, pet beds, electrical cords, etc? Yes  Adequate lighting in your home to reduce risk of falls? Yes   ASSISTIVE DEVICES UTILIZED TO PREVENT FALLS:  Life alert? No  Use of a cane, walker or w/c? No  Grab bars in the bathroom? Yes  Shower chair or bench in shower? Yes  Elevated toilet seat or a handicapped toilet? Yes   TIMED UP AND GO:  Was the test performed? No . .    Cognitive Function:     6CIT Screen 06/23/2020 03/16/2018  What Year? 0 points 0 points  What month? 0 points 0 points  What time? 0 points 0 points  Count back from 20 0 points 0 points  Months in reverse 0 points 0 points  Repeat phrase 0 points 0 points  Total Score 0 0    Immunizations Immunization History  Administered Date(s) Administered  . Fluad Quad(high Dose 65+) 06/28/2019, 04/27/2020  . Influenza, High Dose Seasonal PF 06/22/2018  . Influenza,inj,Quad PF,6+ Mos 06/19/2015, 06/24/2016, 06/23/2017  . PFIZER SARS-COV-2 Vaccination 10/13/2019, 11/03/2019  . Pneumococcal Conjugate-13 04/01/2016  . Pneumococcal Polysaccharide-23 03/16/2018  . Tdap 05/22/2011  . Zoster 06/28/2014    TDAP status: Up to date Flu Vaccine status: Up to date Pneumococcal vaccine status: Up to date Covid-19 vaccine status: Completed vaccines  Qualifies for Shingles Vaccine? Yes   Zostavax completed  No   Shingrix Completed?: gets second dose in December  Screening Tests Health Maintenance  Topic Date Due  . OPHTHALMOLOGY EXAM  01/22/2020  . URINE MICROALBUMIN  09/26/2020  . HEMOGLOBIN A1C  10/25/2020  . FOOT EXAM  04/27/2021  . TETANUS/TDAP  05/21/2021  . MAMMOGRAM  01/13/2022  . Fecal DNA (Cologuard)  10/17/2022  . INFLUENZA VACCINE  Completed  . DEXA SCAN  Completed  . COVID-19 Vaccine  Completed  . Hepatitis C Screening  Completed  . PNA vac Low Risk Adult  Completed    Health Maintenance  Health Maintenance Due  Topic Date Due  . OPHTHALMOLOGY EXAM  01/22/2020    Colorectal cancer screening: Completed 10/18/2019. Repeat every 3 years Mammogram status: Completed 01/14/2020. Repeat every year Bone Density status: Completed 08/17/2014. Results reflect: Bone density results: NORMAL. Repeat every 0 years.  Lung Cancer Screening: (Low Dose CT Chest recommended if Age 37-80 years, 30 pack-year currently smoking OR have quit w/in 15years.) does not qualify.   Lung Cancer Screening Referral: no  Additional Screening:  Hepatitis C Screening: does qualify; Completed 07/29/2016  Vision Screening: Recommended annual ophthalmology exams for early detection of glaucoma and other disorders of the eye. Is the patient up to date with their annual eye exam?  Yes  Who is the provider or what is the name of the office  in which the patient attends annual eye exams? [facility] If pt is not established with a provider, would they like to be referred to a provider to establish care? No .   Dental Screening: Recommended annual dental exams for proper oral hygiene  Community Resource Referral / Chronic Care Management: CRR required this visit?  No   CCM required this visit?  No      Plan:     I have personally reviewed and noted the following in the patient's chart:   . Medical and social history . Use of alcohol, tobacco or illicit drugs  . Current medications and  supplements . Functional ability and status . Nutritional status . Physical activity . Advanced directives . List of other physicians . Hospitalizations, surgeries, and ER visits in previous 12 months . Vitals . Screenings to include cognitive, depression, and falls . Referrals and appointments  In addition, I have reviewed and discussed with patient certain preventive protocols, quality metrics, and best practice recommendations. A written personalized care plan for preventive services as well as general preventive health recommendations were provided to patient.     Kellie Simmering, LPN   11/46/4314   Nurse Notes:

## 2020-06-24 NOTE — Progress Notes (Signed)
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  INTERVAL HISTORY: Patient returns to clinic today for repeat laboratory work and routine yearly evaluation.  She continues to have occasional muscle cramping, but otherwise feels well.  Her peripheral neuropathy is unchanged.  She has no other neurologic complaints.  She denies any recent fevers or illnesses.  She denies any chest pain, shortness of breath, cough, or hemoptysis.  She has no nausea, vomiting, constipation, or diarrhea.  She has no urinary complaints.  Patient feels at her baseline and offers no further specific complaints today.  REVIEW OF SYSTEMS:   Review of Systems  Constitutional: Negative.  Negative for fever, malaise/fatigue and weight loss.  Respiratory: Negative.  Negative for cough and shortness of breath.   Cardiovascular: Negative.  Negative for chest pain and leg swelling.  Gastrointestinal: Negative.  Negative for abdominal pain.  Genitourinary: Negative.  Negative for dysuria.  Musculoskeletal: Negative.  Negative for back pain.       Occasional muscle cramping  Skin: Negative.  Negative for rash.  Neurological: Negative.  Negative for sensory change, focal weakness, weakness and headaches.  Psychiatric/Behavioral: Negative.  The patient is not nervous/anxious.    As per HPI. Otherwise, a complete review of systems is negative.   PAST MEDICAL HISTORY: Past Medical History:  Diagnosis Date  . Breast CA ([place])    Chemo/XRT/lumpectomy  . Breast cancer ([place]) 09/2007   left breast, radiation, chemo  .  Cardiomyopathy secondary   . Hypersomnolence 09/21/2013  . Leg edema, left 09/21/2013  . 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 Pacemaker [employer]   . Sinus tachycardia   . Type 2 diabetes mellitus ([place]) 06/19/2015    PAST SURGICAL HISTORY: Past Surgical History:  Procedure Laterality Date  . 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 BREAST LUMPECTOMY    . CESAREAN SECTION  1983  . EYE SURGERY Left 12/2018   cataracts-Dr.Shah in [place]   . EYE SURGERY Right 01/2019   cataracts- Dr.Shah in [place]  . [place]  . INSERT / REPLACE / REMOVE PACEMAKER  2011   revision @ [facility]  05/13/2003   Guidant Rodman Comp - DR    FAMILY HISTORY Family History  Problem Relation Age of Onset  . Diabetes Mother   . Heart disease Mother   . Thyroid disease Mother   . Hypertension Father   . Cancer Father   . Alcohol abuse Father   . Diabetes Father   . Stroke Other        Family hx of CVA or stroke  . Diabetes Brother   . Diabetes Daughter   . Diabetes Paternal Uncle   . Diabetes Paternal Grandmother   . Breast cancer Maternal Aunt        ADVANCED DIRECTIVES:    HEALTH MAINTENANCE: Social History   Tobacco Use  . Smoking status: Former Smoker    Quit date: 08/26/1981    Years since quitting: 38.8  . Smokeless tobacco: Never Used  Vaping  Use  . Vaping Use: Never used  Substance Use Topics  . Alcohol use: Not Currently  . Drug use: No     Colonoscopy:  PAP:  Bone density:  Lipid panel:  Allergies  Allergen Reactions  . Penicillins Swelling    Causes organs to swell Other reaction(s): Other (See Comments) Organs flipped over per patient; skin starting splitting open and blood vessels burst in legs;  Causes organs to swell  . Levofloxacin Swelling    Leg swelling and muscle weakness Other reaction(s): Other  (See Comments) Tendon pain and weakness in muscles Leg swelling and muscle weakness    Current Outpatient Medications  Medication Sig Dispense Refill  . aMILoride (MIDAMOR) 5 MG tablet Take 1 tablet (5 mg total) by mouth daily. 90 tablet 4  . atenolol (TENORMIN) 50 MG tablet Take 1 tablet (50 mg total) by mouth daily. 90 tablet 4  . atorvastatin (LIPITOR) 40 MG tablet Take 1 tablet (40 mg total) by mouth daily. 90 tablet 0  . chlorpheniramine (CHLOR-TRIMETON) 4 MG tablet Take 4 mg by mouth every 6 (six) hours as needed for allergies.    Marland Kitchen CINNAMON PO Take by mouth daily.    Marland Kitchen esomeprazole (NEXIUM) 20 MG capsule Take 20 mg by mouth daily.    . fluticasone (FLONASE) 50 MCG/ACT nasal spray Place 2 sprays into both nostrils daily. 16 g 12  . glucose blood (ACCU-CHEK AVIVA PLUS) test strip TEST BLOOD SUGAR ONCE DAILY 100 each 12  . levocetirizine (XYZAL) 5 MG tablet Take 5 mg by mouth every evening.    . magnesium oxide (MAG-OX) 400 MG tablet Take 400 mg by mouth 2 (two) times daily.    . melatonin 3 MG TABS tablet Take 3 mg by mouth at bedtime.    . meloxicam (MOBIC) 15 MG tablet Take 1 tablet by mouth daily.    . metFORMIN (GLUCOPHAGE) 500 MG tablet Take 1 tablet (500 mg total) by mouth 2 (two) times daily with a meal. 180 tablet 3  . nystatin (MYCOSTATIN/NYSTOP) powder Apply 1 application topically 2 (two) times daily. 15 g 4  . pregabalin (LYRICA) 50 MG capsule Take 1 capsule (50 mg total) by mouth 3 (three) times daily. 270 capsule 0  . SUMAtriptan (IMITREX) 100 MG tablet Take 1 tablet (100 mg total) by mouth as needed. 10 tablet 12   No current facility-administered medications for this visit.    OBJECTIVE: Vitals:   06/27/20 1019  BP: 123/89  Pulse: 76  Resp: 20  Temp: 97.7 F (36.5 C)  SpO2: 99%     Body mass index is 38.74 kg/m.    ECOG FS:0 - Asymptomatic  General: Well-developed, well-nourished, no acute distress. Eyes: Pink conjunctiva, anicteric sclera. HEENT:  Normocephalic, moist mucous membranes. Lungs: No audible wheezing or coughing. Heart: Regular rate and rhythm. Abdomen: Soft, nontender, no obvious distention. Musculoskeletal: No edema, cyanosis, or clubbing. Neuro: Alert, answering all questions appropriately. Cranial nerves grossly intact. Skin: No rashes or petechiae noted. Psych: Normal affect.  LAB RESULTS:  Lab Results  Component Value Date   NA 144 04/27/2020   K 4.5 04/27/2020   CL 105 04/27/2020   CO2 26 04/27/2020   GLUCOSE 107 (H) 04/27/2020   BUN 19 04/27/2020   CREATININE 0.76 04/27/2020   CALCIUM 9.5 04/27/2020   PROT 6.4 09/27/2019   ALBUMIN 4.5 09/27/2019   AST 24 09/27/2019   ALT 28 09/27/2019   ALKPHOS 108 09/27/2019   BILITOT 0.4 09/27/2019  GFRNONAA 80 04/27/2020   GFRAA 92 04/27/2020    Lab Results  Component Value Date   WBC 6.3 09/27/2019   NEUTROABS 3.5 09/27/2019   HGB 13.4 09/27/2019   HCT 39.5 09/27/2019   MCV 88 09/27/2019   PLT 212 09/27/2019     STUDIES: No results [name].  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  Patient completed year long maintenance Herceptin on Jan 02, 2009.  Patient's most recent mammogram on Jan 14, 2020 was reported as BI-RADS 1.  Repeat in May 2022.  Patient is now greater than 10 years out from completing her treatments and can be discharged from clinic.  Recommend primary care physician continue to order yearly screening mammograms.  Please refer patient back if there are any questions or concerns.  2.  Hypomagnesemia: Chronic and unchanged.  Patient's magnesium is 1.4 today.  Despite periodic magnesium infusions, her levels do not seem to change months and remain chronically decreased.  Continue amiloride 5 mg daily and 3 tabs Slow-Mag twice a  day.  No further follow-up as above. 3.  Pacemaker: Continue monitoring and evaluation per cardiology.    Patient expressed understanding and was in agreement with this plan. She also understands that She can call clinic at any time with any questions, concerns, or complaints.    Lloyd Huger, MD   06/27/2020 11:53 AM

## 2020-06-26 ENCOUNTER — Encounter: Payer: Self-pay | Admitting: Oncology

## 2020-06-27 ENCOUNTER — Inpatient Hospital Stay: Payer: Medicare PPO | Attending: Oncology

## 2020-06-27 ENCOUNTER — Inpatient Hospital Stay: Payer: Medicare PPO

## 2020-06-27 ENCOUNTER — Encounter: Payer: Self-pay | Admitting: Oncology

## 2020-06-27 ENCOUNTER — Inpatient Hospital Stay (HOSPITAL_BASED_OUTPATIENT_CLINIC_OR_DEPARTMENT_OTHER): Payer: Medicare PPO | Admitting: Oncology

## 2020-06-27 ENCOUNTER — Other Ambulatory Visit: Payer: Self-pay

## 2020-06-27 DIAGNOSIS — C50912 Malignant neoplasm of unspecified site of left female breast: Secondary | ICD-10-CM | POA: Diagnosis not present

## 2020-06-27 DIAGNOSIS — Z923 Personal history of irradiation: Secondary | ICD-10-CM | POA: Diagnosis not present

## 2020-06-27 DIAGNOSIS — Z950 Presence of cardiac pacemaker: Secondary | ICD-10-CM | POA: Diagnosis not present

## 2020-06-27 DIAGNOSIS — Z87891 Personal history of nicotine dependence: Secondary | ICD-10-CM | POA: Diagnosis not present

## 2020-06-27 DIAGNOSIS — Z853 Personal history of malignant neoplasm of breast: Secondary | ICD-10-CM | POA: Insufficient documentation

## 2020-06-27 LAB — MAGNESIUM: Magnesium: 1.4 mg/dL — ABNORMAL LOW (ref 1.7–2.4)

## 2020-08-11 ENCOUNTER — Ambulatory Visit: Payer: Medicare PPO | Admitting: Family Medicine

## 2020-08-11 ENCOUNTER — Encounter: Payer: Self-pay | Admitting: Nurse Practitioner

## 2020-08-11 ENCOUNTER — Other Ambulatory Visit: Payer: Self-pay

## 2020-08-11 ENCOUNTER — Ambulatory Visit (INDEPENDENT_AMBULATORY_CARE_PROVIDER_SITE_OTHER): Payer: Medicare PPO | Admitting: Nurse Practitioner

## 2020-08-11 VITALS — BP 115/74 | HR 68 | Temp 98.0°F | Ht 63.39 in | Wt 223.8 lb

## 2020-08-11 DIAGNOSIS — E1159 Type 2 diabetes mellitus with other circulatory complications: Secondary | ICD-10-CM | POA: Diagnosis not present

## 2020-08-11 DIAGNOSIS — I152 Hypertension secondary to endocrine disorders: Secondary | ICD-10-CM

## 2020-08-11 DIAGNOSIS — I440 Atrioventricular block, first degree: Secondary | ICD-10-CM | POA: Diagnosis not present

## 2020-08-11 DIAGNOSIS — E1169 Type 2 diabetes mellitus with other specified complication: Secondary | ICD-10-CM

## 2020-08-11 DIAGNOSIS — J302 Other seasonal allergic rhinitis: Secondary | ICD-10-CM

## 2020-08-11 DIAGNOSIS — E785 Hyperlipidemia, unspecified: Secondary | ICD-10-CM

## 2020-08-11 DIAGNOSIS — Z950 Presence of cardiac pacemaker: Secondary | ICD-10-CM

## 2020-08-11 DIAGNOSIS — L989 Disorder of the skin and subcutaneous tissue, unspecified: Secondary | ICD-10-CM

## 2020-08-11 LAB — BAYER DCA HB A1C WAIVED: HB A1C (BAYER DCA - WAIVED): 5.7 % (ref <7.0)

## 2020-08-11 MED ORDER — ATENOLOL 50 MG PO TABS: 50.0000 mg | ORAL_TABLET | Freq: Every day | ORAL | 4 refills | Status: DC

## 2020-08-11 MED ORDER — ATORVASTATIN CALCIUM 40 MG PO TABS: 40.0000 mg | ORAL_TABLET | Freq: Every day | ORAL | 4 refills | Status: DC

## 2020-08-11 MED ORDER — MONTELUKAST SODIUM 10 MG PO TABS
10.0000 mg | ORAL_TABLET | Freq: Every day | ORAL | 3 refills | Status: DC
Start: 2020-08-11 — End: 2020-11-07

## 2020-08-11 MED ORDER — METFORMIN HCL 500 MG PO TABS: 500.0000 mg | ORAL_TABLET | Freq: Two times a day (BID) | ORAL | 4 refills | Status: DC

## 2020-08-11 NOTE — Progress Notes (Signed)
.cfpbas   BP 115/74    Pulse 68    Temp 98 F (36.7 C) (Oral)    Ht 5' 3.39" (1.61 m)    Wt 223 lb 12.8 oz (101.5 kg)    SpO2 99%    BMI 39.16 kg/m    Subjective:    Patient ID: [name], female    DOB: May 24, 1951, 69 y.o.   MRN: [id number]  HPI: KANITA DELAGE is a 69 y.o. female  Chief Complaint  Patient presents with   Diabetes   Hypertension   Hyperlipidemia   Sinus Problem    Sinuses are still bothering her   DIABETES Continues on Metformin 500 MG BID.  Last A1C in September 5.9%.  Her B12 level was 418 on recent check. Hypoglycemic episodes:no Polydipsia/polyuria: no Visual disturbance: no Chest pain: no Paresthesias: no Glucose Monitoring: yes             Accucheck frequency: Daily             Fasting glucose: on average 109-120 -- 119 this morning             Post prandial:             Evening:             Before meals: Taking Insulin?: no             Long acting insulin:             Short acting insulin: Blood Pressure Monitoring: daily Retinal Examination: Up to Date -- [facility], Dr. Valma Cava Foot Exam: Up to Date Pneumovax: Up to Date Influenza: Up to Date Aspirin: yes   HYPERTENSION / HYPERLIPIDEMIA Has pacemaker, PPM [employer].  Followed by cardiology and last seen 10/19/2019, note reviewed and no changes made.  Has 4 more years on current pacemaker, this is pacemaker #2. Satisfied with current treatment? yes Duration of hypertension: chronic BP monitoring frequency: daily BP range: 110/70 range at home BP medication side effects: no Duration of hyperlipidemia: chronic Cholesterol medication side effects: no Cholesterol supplements: none Medication compliance: good compliance Aspirin: no Recent stressors: no Recurrent headaches: no Visual changes: no Palpitations: no Dyspnea: no Chest pain: no Lower extremity edema: no Dizzy/lightheaded: no   HISTORY OF BREAST CA: Has ongoing neuropathy and hypomagnesemia, was followed by  oncology, but was released from their care on 06/27/20 for PCP to continue to [facility]. Received magnesium infusions, currently taking Amiloride and Slow Mag .  Her magnesium levels are chronically low, last level on 06/27/20 was 1.4.  Takes Lyrica for neuropathy pain. Still has uterus.  Continues to have ongoing hot flashes for the past year, all through the day -- she does not wish to start any medications for this. Last mammogram 01/14/20.   SKIN LESION Has had lesion to left side back for years, but lately feels more scaly. Duration: years Location: left side back Painful: no Itching: no Onset: gradual Context: changing Associated signs and symptoms: scaly History of skin cancer: no History of precancerous skin lesions: no Family history of skin cancer: yes -- she does not recall who  ALLERGIES Has had ongoing sinus issues for months, currently taking Xyzal and Flonase -- continues to wake up with drainage in morning, clearing throat a lot, and nausea from drainage + diarrhea. Duration: months Runny nose: sometimes Nasal congestion: sometimes Nasal itching: yes Sneezing: yes Eye swelling, itching or discharge: yes Post nasal drip: yes Cough: none Sinus pressure:  yes  Ear pain: none Ear pressure: sometimes Fever: none Symptoms occur seasonally: yes Symptoms occur perenially: no Satisfied with current treatment: yes Allergist evaluation in past: no Allergen injection immunotherapy: no Recurrent sinus infections: no ENT evaluation in past: no Known environmental allergy: no Indoor pets: yes History of asthma: no Current allergy medications: Xyzal and Flonase Treatments attempted: Xyzal and Flonase  Relevant past medical, surgical, family and social history reviewed and updated as indicated. Interim medical history since our last visit reviewed. Allergies and medications reviewed and updated.  Review of Systems  Constitutional: Negative for activity change, appetite change,  diaphoresis, fatigue and fever.  Respiratory: Negative for cough, chest tightness and shortness of breath.   Cardiovascular: Negative for chest pain, palpitations and leg swelling.  Gastrointestinal: Negative.   Neurological: Negative.   Psychiatric/Behavioral: Negative.     Per HPI unless specifically indicated above     Objective:    BP 115/74    Pulse 68    Temp 98 F (36.7 C) (Oral)    Ht 5' 3.39" (1.61 m)    Wt 223 lb 12.8 oz (101.5 kg)    SpO2 99%    BMI 39.16 kg/m   Wt Readings from Last 3 Encounters:  08/11/20 223 lb 12.8 oz (101.5 kg)  06/27/20 222 lb 3.2 oz (100.8 kg)  06/23/20 219 lb 12.8 oz (99.7 kg)    Physical Exam Vitals and nursing note reviewed.  Constitutional:      General: She is awake. She is not in acute distress.    Appearance: She is well-developed and well-groomed. She is morbidly obese. She is not ill-appearing.  HENT:     Head: Normocephalic.     Right Ear: Hearing, tympanic membrane, ear canal and external ear normal.     Left Ear: Hearing, tympanic membrane, ear canal and external ear normal.     Nose: Nose normal.     Right Sinus: No maxillary sinus tenderness or frontal sinus tenderness.     Left Sinus: No maxillary sinus tenderness or frontal sinus tenderness.     Mouth/Throat:     Mouth: Mucous membranes are moist.  Eyes:     General: Lids are normal.        Right eye: No discharge.        Left eye: No discharge.     Conjunctiva/sclera: Conjunctivae normal.     Pupils: Pupils are equal, round, and reactive to light.  Neck:     Thyroid: No thyromegaly.     Vascular: No carotid bruit.  Cardiovascular:     Rate and Rhythm: Normal rate and regular rhythm.     Heart sounds: Normal heart sounds. No murmur heard. No gallop.   Pulmonary:     Effort: Pulmonary effort is normal. No accessory muscle usage or respiratory distress.     Breath sounds: Normal breath sounds.  Abdominal:     General: Bowel sounds are normal.     Palpations: Abdomen  is soft.  Musculoskeletal:     Cervical back: Normal range of motion and neck supple.     Right lower leg: No edema.     Left lower leg: No edema.  Skin:    General: Skin is warm and dry.       Neurological:     Mental Status: She is alert and oriented to person, place, and time.  Psychiatric:        Attention and Perception: Attention normal.  Mood and Affect: Mood normal.        Speech: Speech normal.        Behavior: Behavior normal. Behavior is cooperative.        Thought Content: Thought content normal.    Results for orders placed or performed in visit on 06/27/20  Magnesium  Result Value Ref Range   Magnesium 1.4 (L) 1.7 - 2.4 mg/dL      Assessment & Plan:   Problem List Items Addressed This Visit      Cardiovascular and Mediastinum   AV BLOCK, 1ST DEGREE    Followed by cardiology, continue collaboration and review notes.      Relevant Medications   atorvastatin (LIPITOR) 40 MG tablet   atenolol (TENORMIN) 50 MG tablet   Hypertension associated with diabetes ([place])    Chronic, ongoing with BP well below goal.  Continue current medication regimen and adjust as needed + continue collaboration with cardiology.  BMP today.  Recommend she continue to monitor BP at home and focus on DASH diet.  Return in 3 months.      Relevant Medications   atorvastatin (LIPITOR) 40 MG tablet   metFORMIN (GLUCOPHAGE) 500 MG tablet   atenolol (TENORMIN) 50 MG tablet     Respiratory   Seasonal allergic rhinitis    Ongoing with continued issues, even with taking Flonase and Xyzal daily.  Will trial Singulair daily, script send, and slowly discontinue Xyzal.  Referral to ENT for allergy testing and assessment due to ongoing sinus issues.      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 Last urine micro ALB 10 and A:C <30. Continue current medication  regimen as A1C at goal and no hypoglycemia at home. If continued downward trend may consider reduction of medication.  Continue to monitor BS at home daily.   Return in 3 months for visit.      Relevant Medications   atorvastatin (LIPITOR) 40 MG tablet   metFORMIN (GLUCOPHAGE) 500 MG tablet   Other Relevant Orders   Bayer DCA Hb A1c Waived   Basic metabolic panel   Hyperlipidemia associated with type 2 diabetes mellitus (HCC)    Chronic, ongoing.  Continue current medication regimen and adjust as needed.  Lipid panel next visit.      Relevant Medications   atorvastatin (LIPITOR) 40 MG tablet   metFORMIN (GLUCOPHAGE) 500 MG tablet     Musculoskeletal and Integument   Skin lesion of back    Referral to dermatology for further evaluation due to changes of lesion.      Relevant Orders   Ambulatory referral to Dermatology     Other   PPM-[employer]    Continue collaboration with cardiology for checks.      Hypomagnesemia    Chronically low.  Continue collaboration with nephrology and Amiloride as ordered by them and daily Mag.  Mag level today.      Relevant Orders   Magnesium   Morbid obesity ([place])    BMI 39.16 with T2DM and HTN.  Recommended eating smaller high protein, low fat meals more frequently and exercising 30 mins a day 5 times a week with a goal of 10-15lb weight loss in the next 3 months. Patient voiced their understanding and motivation to adhere to these recommendations.       Relevant Medications   metFORMIN (  GLUCOPHAGE) 500 MG tablet       Follow up plan: Return in about 7 weeks (around 09/27/2020) for Annual physical due.

## 2020-08-11 NOTE — Assessment & Plan Note (Addendum)
BMI 39.16 with T2DM and HTN.  Recommended eating smaller high protein, low fat meals more frequently and exercising 30 mins a day 5 times a week with a goal of 10-15lb weight loss in the next 3 months. Patient voiced their understanding and motivation to adhere to these recommendations.

## 2020-08-11 NOTE — Assessment & Plan Note (Signed)
Ongoing with continued issues, even with taking Flonase and Xyzal daily.  Will trial Singulair daily, script send, and slowly discontinue Xyzal.  Referral to ENT for allergy testing and assessment due to ongoing sinus issues.

## 2020-08-11 NOTE — Assessment & Plan Note (Signed)
Followed by cardiology, continue collaboration and review notes.

## 2020-08-11 NOTE — Assessment & Plan Note (Signed)
Referral to dermatology for further evaluation due to changes of lesion.

## 2020-08-11 NOTE — Assessment & Plan Note (Signed)
Chronic, ongoing.  Continue current medication regimen and adjust as needed.  Lipid panel next visit.    

## 2020-08-11 NOTE — Assessment & Plan Note (Addendum)
Chronic, ongoing with BP well below goal.  Continue current medication regimen and adjust as needed + continue collaboration with cardiology.  BMP today.  Recommend she continue to monitor BP at home and focus on DASH diet.  Return in 3 months.

## 2020-08-11 NOTE — Patient Instructions (Signed)

## 2020-08-11 NOTE — Assessment & Plan Note (Addendum)
Chronically low.  Continue collaboration with nephrology and Amiloride as ordered by them and daily Mag.  Mag level today. 

## 2020-08-11 NOTE — Assessment & Plan Note (Signed)
Continue collaboration with cardiology for checks. 

## 2020-08-11 NOTE — Assessment & Plan Note (Signed)
Chronic, ongoing with A1C today 5.7%. Last urine micro ALB 10 and A:C <30. Continue current medication regimen as A1C at goal and no hypoglycemia at home. If continued downward trend may consider reduction of medication.  Continue to monitor BS at home daily.   Return in 3 months for visit.

## 2020-08-12 LAB — BASIC METABOLIC PANEL
BUN/Creatinine Ratio: 27 (ref 12–28)
BUN: 25 mg/dL (ref 8–27)
CO2: 23 mmol/L (ref 20–29)
Calcium: 9.7 mg/dL (ref 8.7–10.3)
Chloride: 103 mmol/L (ref 96–106)
Creatinine, Ser: 0.91 mg/dL (ref 0.57–1.00)
GFR calc Af Amer: 74 mL/min/{1.73_m2} (ref >59)
GFR calc non Af Amer: 64 mL/min/{1.73_m2} (ref >59)
Glucose: 112 mg/dL — ABNORMAL HIGH (ref 65–99)
Potassium: 4.8 mmol/L (ref 3.5–5.2)
Sodium: 141 mmol/L (ref 134–144)

## 2020-08-12 LAB — MAGNESIUM: Magnesium: 1.5 mg/dL — ABNORMAL LOW (ref 1.6–2.3)

## 2020-08-13 NOTE — Progress Notes (Signed)
Contacted via MyChart   Good morning [name], your labs have returned.  Magnesium levels remains close to previous, at 1.5, previous 1.4.  Continue supplements daily.  Kidney function remains stable.  Any questions? Keep being awesome!!  Thank you for allowing me to participate in your care. Kindest regards, Dezaree Tracey

## 2020-08-16 ENCOUNTER — Telehealth: Payer: Self-pay | Admitting: Family Medicine

## 2020-08-16 ENCOUNTER — Other Ambulatory Visit: Payer: Self-pay | Admitting: Nurse Practitioner

## 2020-08-16 DIAGNOSIS — J3489 Other specified disorders of nose and nasal sinuses: Secondary | ICD-10-CM

## 2020-08-16 MED ORDER — AZITHROMYCIN 250 MG PO TABS: ORAL_TABLET | ORAL | 0 refills | Status: DC

## 2020-08-16 NOTE — Telephone Encounter (Signed)
Pt states she was just in the office last Friday and saw Jolene.  She said she had a sinus issue starting, and told Jolene. However, pt states it has now developed into a full blown sinus infection.  Pt states she usually needs a zpak to get rid of it. Pt requesting zpak.  Pt made appt for 3 pm today, phone call only.  If Jolene will call in Rx, I told pt we can cancel this appt.  [place], [place] - [place] Phone:  [phone]  Fax:  [phone]

## 2020-08-16 NOTE — Telephone Encounter (Signed)
Excellent, thank you!

## 2020-08-16 NOTE — Progress Notes (Signed)
Covid testing order

## 2020-08-16 NOTE — Telephone Encounter (Signed)
Since we are going into holiday weekend I have sent in this prescription, although in future do prefer a visit if possible.  I would like her to also have Covid testing here at office since we are seeing higher cases in community again and this would be beneficial to have done.  Please schedule if she is okay with this.  Thank you.

## 2020-08-16 NOTE — Telephone Encounter (Signed)
Called pt she states that she took an at home test Monday and it came back neg

## 2020-08-22 ENCOUNTER — Other Ambulatory Visit: Payer: Self-pay | Admitting: Nurse Practitioner

## 2020-08-22 DIAGNOSIS — T451X5A Adverse effect of antineoplastic and immunosuppressive drugs, initial encounter: Secondary | ICD-10-CM

## 2020-08-22 NOTE — Telephone Encounter (Signed)
Last fill for 90 day supply 05/17/20

## 2020-08-22 NOTE — Telephone Encounter (Signed)
Routing to provider  

## 2020-08-22 NOTE — Telephone Encounter (Signed)
Requested medication (s) are due for refill today: yes  Requested medication (s) are on the active medication list: yes  Last refill:  05/17/2020  Future visit scheduled: yes  Notes to clinic: this refill cannot be delegated    Requested Prescriptions  Pending Prescriptions Disp Refills   pregabalin (LYRICA) 50 MG capsule [Pharmacy Med Name: PREGABALIN 50 MG CAPSULE] 270 capsule 0    Sig: Take 1 capsule (50 mg total) by mouth 3 (three) times daily.      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

## 2020-08-29 ENCOUNTER — Telehealth: Payer: Medicare PPO | Admitting: Nurse Practitioner

## 2020-09-12 ENCOUNTER — Encounter: Payer: Self-pay | Admitting: Nurse Practitioner

## 2020-09-12 ENCOUNTER — Ambulatory Visit (INDEPENDENT_AMBULATORY_CARE_PROVIDER_SITE_OTHER): Payer: Medicare PPO | Admitting: Nurse Practitioner

## 2020-09-12 DIAGNOSIS — R059 Cough, unspecified: Secondary | ICD-10-CM

## 2020-09-12 MED ORDER — DOXYCYCLINE HYCLATE 100 MG PO TABS
100.0000 mg | ORAL_TABLET | Freq: Two times a day (BID) | ORAL | 0 refills | Status: DC
Start: 2020-09-12 — End: 2020-10-24

## 2020-09-12 MED ORDER — HYDROCOD POLST-CPM POLST ER 10-8 MG/5ML PO SUER: 5.0000 mL | Freq: Two times a day (BID) | ORAL | 0 refills | Status: DC | PRN

## 2020-09-12 MED ORDER — PREDNISONE 20 MG PO TABS
40.0000 mg | ORAL_TABLET | Freq: Every day | ORAL | 0 refills | Status: AC
Start: 2020-09-12 — End: 2020-09-17

## 2020-09-12 NOTE — Assessment & Plan Note (Signed)
Ongoing cough since Christmas, some improvement with ZPack.  Suspect ongoing sinusitis.  Tested negative for Covid, is vaccinated x 2.  Is being followed by ENT due to frequent sinus infections with underlying allergic rhinitis.  At this time will trial Doxycycline, allergic to penicillin (swelling), and Prednisone burst (diabetes well controlled at this time).  Scripts sent for these and Tussionex for night time sleep.  Continue remainder of medication regimen for allergies at home + follow-up with ENT.  Recommend if worsening or ongoing symptoms she immediately notify provider as would consider imaging if ongoing.

## 2020-09-12 NOTE — Progress Notes (Signed)
BP 126/78   Temp (!) 97.2 F (36.2 C) (Oral)    Subjective:    Patient ID: [name], female    DOB: 20-Jun-1951, 70 y.o.   MRN: [id number]  HPI: [name] is a 70 y.o. female  Chief Complaint  Patient presents with  . Cough    Cough, lots  of drainage, congestion, hoarse, cough is productive, chest congestion. Patient states that she  has seen ENT. Z-pack helped. Patient states that the problem seems to be in her chest.   . This visit was completed via telephone due to the restrictions of the COVID-19 pandemic. All issues as above were discussed and addressed but no physical exam was performed. If it was felt that the patient should be evaluated in the office, they were directed there. The patient verbally consented to this visit. Patient was unable to complete an audio/visual visit due to Lack of equipment. Due to the catastrophic nature of the COVID-19 pandemic, this visit was done through audio contact only. . Location of the patient: home . Location of the provider: work . Those involved with this call:  . Provider: Marnee Guarneri, DNP . CMA: Frazier Butt, CMA . Front Desk/Registration: Jill Side  . Time spent on call: 21 minutes on the phone discussing health concerns. 15 minutes total spent in review of patient's record and preparation of their chart.  . I verified patient identity using two factors (patient name and date of birth). Patient consents verbally to being seen via telemedicine visit today.   UPPER RESPIRATORY TRACT INFECTION Started with symptoms prior to Christmas started Zpack on 08/16/20.  Reports ongoing cough and drainage + coughing up phlegm.  Saw ENT and at that time they reported ZPack had improved symptoms.  Is scheduled for allergy testing.  Was given a different nose spray and inhaler by ENT -- Flovent and Astelin.  Has had Covid vaccine x 2.  Did test negative for Covid.  Last 5.7% A1c December. Fever: no Cough: yes Shortness of breath:  no Wheezing: yes Chest pain: no Chest tightness: no Chest congestion: yes Nasal congestion: yes Runny nose: yes Post nasal drip: yes Sneezing: no Sore throat: no Swollen glands: no Sinus pressure: no Headache: no Face pain: no Toothache: no Ear pain: none Ear pressure: yes bilateral Eyes red/itching:no Eye drainage/crusting: no  Vomiting: no Rash: no Fatigue: no Sick contacts: no Strep contacts: no  Context: fluctuating Recurrent sinusitis: no Relief with OTC cold/cough medications: yes  Treatments attempted: cold/sinus and cough syrup   Relevant past medical, surgical, family and social history reviewed and updated as indicated. Interim medical history since our last visit reviewed. Allergies and medications reviewed and updated.  Review of Systems  Constitutional: Positive for fatigue. Negative for activity change, appetite change and fever.  HENT: Positive for congestion, postnasal drip and rhinorrhea. Negative for ear discharge, ear pain, facial swelling, sinus pressure, sinus pain, sneezing, sore throat and voice change.   Eyes: Negative for pain and visual disturbance.  Respiratory: Positive for cough, chest tightness and wheezing. Negative for shortness of breath.   Cardiovascular: Negative for chest pain, palpitations and leg swelling.  Gastrointestinal: Negative.   Endocrine: Negative.   Musculoskeletal: Negative for myalgias.  Neurological: Negative.   Psychiatric/Behavioral: Negative.     Per HPI unless specifically indicated above     Objective:    BP 126/78   Temp (!) 97.2 F (36.2 C) (Oral)   Wt Readings from Last 3 Encounters:  08/11/20  223 lb 12.8 oz (101.5 kg)  06/27/20 222 lb 3.2 oz (100.8 kg)  06/23/20 219 lb 12.8 oz (99.7 kg)    Physical Exam   Unable to assess due to telephone visit only.  No SOB with talking noted.    Results for orders placed or performed in visit on 08/16/20  HM DIABETES EYE EXAM  Result Value Ref Range   HM  Diabetic Eye Exam No Retinopathy No Retinopathy      Assessment & Plan:   Problem List Items Addressed This Visit      Other   Cough    Ongoing cough since Christmas, some improvement with ZPack.  Suspect ongoing sinusitis.  Tested negative for Covid, is vaccinated x 2.  Is being followed by ENT due to frequent sinus infections with underlying allergic rhinitis.  At this time will trial Doxycycline, allergic to penicillin (swelling), and Prednisone burst (diabetes well controlled at this time).  Scripts sent for these and Tussionex for night time sleep.  Continue remainder of medication regimen for allergies at home + follow-up with ENT.  Recommend if worsening or ongoing symptoms she immediately notify provider as would consider imaging if ongoing.           I discussed the assessment and treatment plan with the patient. The patient was provided an opportunity to ask questions and all were answered. The patient agreed with the plan and demonstrated an understanding of the instructions.   The patient was advised to call back or seek an in-person evaluation if the symptoms worsen or if the condition fails to improve as anticipated.   I provided 21+ minutes of time during this encounter.  Follow up plan: Return if symptoms worsen or fail to improve.

## 2020-09-12 NOTE — Patient Instructions (Signed)

## 2020-09-22 ENCOUNTER — Emergency Department
Admission: EM | Admit: 2020-09-22 | Discharge: 2020-09-22 | Disposition: A | Discharge status: Home / Self Care | Payer: Medicare PPO | Attending: Emergency Medicine | Admitting: Emergency Medicine

## 2020-09-22 ENCOUNTER — Encounter: Payer: Self-pay | Admitting: Emergency Medicine

## 2020-09-22 ENCOUNTER — Other Ambulatory Visit: Payer: Self-pay

## 2020-09-22 ENCOUNTER — Emergency Department: Payer: Medicare PPO

## 2020-09-22 DIAGNOSIS — N200 Calculus of kidney: Secondary | ICD-10-CM | POA: Insufficient documentation

## 2020-09-22 DIAGNOSIS — Z79899 Other long term (current) drug therapy: Secondary | ICD-10-CM | POA: Insufficient documentation

## 2020-09-22 DIAGNOSIS — E119 Type 2 diabetes mellitus without complications: Secondary | ICD-10-CM | POA: Diagnosis not present

## 2020-09-22 DIAGNOSIS — E860 Dehydration: Secondary | ICD-10-CM | POA: Diagnosis not present

## 2020-09-22 DIAGNOSIS — I10 Essential (primary) hypertension: Secondary | ICD-10-CM | POA: Diagnosis not present

## 2020-09-22 DIAGNOSIS — Z950 Presence of cardiac pacemaker: Secondary | ICD-10-CM | POA: Insufficient documentation

## 2020-09-22 DIAGNOSIS — Z853 Personal history of malignant neoplasm of breast: Secondary | ICD-10-CM | POA: Diagnosis not present

## 2020-09-22 DIAGNOSIS — M545 Low back pain, unspecified: Secondary | ICD-10-CM | POA: Diagnosis present

## 2020-09-22 DIAGNOSIS — Z87891 Personal history of nicotine dependence: Secondary | ICD-10-CM | POA: Insufficient documentation

## 2020-09-22 DIAGNOSIS — Z7984 Long term (current) use of oral hypoglycemic drugs: Secondary | ICD-10-CM | POA: Diagnosis not present

## 2020-09-22 LAB — CBC
HCT: 38.5 % (ref 36.0–46.0)
Hemoglobin: 12.7 g/dL (ref 12.0–15.0)
MCH: 29.5 pg (ref 26.0–34.0)
MCHC: 33 g/dL (ref 30.0–36.0)
MCV: 89.3 fL (ref 80.0–100.0)
Platelets: 174 10*3/uL (ref 150–400)
RBC: 4.31 MIL/uL (ref 3.87–5.11)
RDW: 13.4 % (ref 11.5–15.5)
WBC: 11.4 10*3/uL — ABNORMAL HIGH (ref 4.0–10.5)
nRBC: 0 % (ref 0.0–0.2)

## 2020-09-22 LAB — URINALYSIS, COMPLETE (UACMP) WITH MICROSCOPIC
Bacteria, UA: NONE SEEN
Bilirubin Urine: NEGATIVE
Glucose, UA: NEGATIVE mg/dL
Hgb urine dipstick: NEGATIVE
Ketones, ur: NEGATIVE mg/dL
Leukocytes,Ua: NEGATIVE
Nitrite: NEGATIVE
Protein, ur: NEGATIVE mg/dL
Specific Gravity, Urine: 1.023 (ref 1.005–1.030)
pH: 5 (ref 5.0–8.0)

## 2020-09-22 LAB — BASIC METABOLIC PANEL
Anion gap: 10 (ref 5–15)
BUN: 36 mg/dL — ABNORMAL HIGH (ref 8–23)
CO2: 26 mmol/L (ref 22–32)
Calcium: 9 mg/dL (ref 8.9–10.3)
Chloride: 107 mmol/L (ref 98–111)
Creatinine, Ser: 1.24 mg/dL — ABNORMAL HIGH (ref 0.44–1.00)
GFR, Estimated: 47 mL/min — ABNORMAL LOW (ref >60)
Glucose, Bld: 158 mg/dL — ABNORMAL HIGH (ref 70–99)
Potassium: 4.7 mmol/L (ref 3.5–5.1)
Sodium: 143 mmol/L (ref 135–145)

## 2020-09-22 MED ORDER — KETOROLAC TROMETHAMINE 30 MG/ML IJ SOLN
30.0000 mg | Freq: Once | INTRAMUSCULAR | Status: AC
  Administered 2020-09-22: 30 mg via INTRAVENOUS
  Filled 2020-09-22: qty 1

## 2020-09-22 MED ORDER — LACTATED RINGERS IV BOLUS
1000.0000 mL | Freq: Once | INTRAVENOUS | Status: AC
  Administered 2020-09-22: 1000 mL via INTRAVENOUS

## 2020-09-22 MED ORDER — HYDROCODONE-ACETAMINOPHEN 5-325 MG PO TABS: 2.0000 | ORAL_TABLET | Freq: Four times a day (QID) | ORAL | 0 refills | Status: AC | PRN

## 2020-09-22 MED ORDER — ONDANSETRON HCL 4 MG PO TABS: 4.0000 mg | ORAL_TABLET | Freq: Three times a day (TID) | ORAL | 0 refills | Status: DC | PRN

## 2020-09-22 MED ORDER — HYDROMORPHONE HCL 1 MG/ML IJ SOLN
0.5000 mg | Freq: Once | INTRAMUSCULAR | Status: AC
  Administered 2020-09-22: 0.5 mg via INTRAVENOUS
  Filled 2020-09-22: qty 1

## 2020-09-22 MED ORDER — TAMSULOSIN HCL 0.4 MG PO CAPS: 0.4000 mg | ORAL_CAPSULE | Freq: Every day | ORAL | 0 refills | Status: AC

## 2020-09-22 NOTE — ED Provider Notes (Signed)
[facility] Emergency Department Provider Note  ____________________________________________   Event Date/Time   First MD Initiated Contact with Patient 09/22/20 1108     (approximate)  I have reviewed the triage vital signs and the nursing notes.   HISTORY  Chief Complaint Urinary Frequency, Back Pain, and Emesis   HPI [name] is a 70 y.o. female with past medical history of breast cancer status post chemoradiation lumpectomy, cardiomyopathy, and DM who presents for assessment of acute nontraumatic discomfort in her left lower back associated with urinary frequency and urgency that began last night.  Patient endorses a little bit of nausea but denies any vomiting.  No headache, earache, sore throat, chest pain, cough, shortness of breath, abdominal pain, burning with urination, blood in urine, diarrhea, rash or extremity pain.  No clearly getting a breathing factors.  No prior similar episodes.  Patient has any significant NSAID use, post drug use or EtOH use.         Past Medical History:  Diagnosis Date  . Breast CA ([place])    Chemo/XRT/lumpectomy  . Breast cancer ([place]) 09/2007   left breast, radiation, chemo  . Cardiomyopathy secondary   . Hypersomnolence 09/21/2013  . Leg edema, left 09/21/2013  . 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 Pacemaker [employer]   . Sinus tachycardia   . Type 2 diabetes mellitus ([place]) 06/19/2015    Patient Active Problem List   Diagnosis Date Noted  . Cough 09/12/2020  . Seasonal allergic rhinitis 08/11/2020  . Skin lesion of back 08/11/2020  . Chronic pain of left ankle 04/27/2020  . Morbid obesity ([place]) 04/27/2020  . Tinea corporis 02/03/2020  . Hot flashes 12/28/2019  . B12 deficiency 09/28/2019  . Trigger finger, right middle finger 03/22/2019  . Advanced care planning/counseling discussion 09/09/2017  . Hyperlipidemia associated with type 2 diabetes mellitus ([place]) 07/29/2016  .  Chemotherapy-induced neuropathy ([place]) 04/01/2016  . History of breast cancer 10/02/2015  . Hypertension associated with diabetes ([place]) 06/19/2015  . Diabetes mellitus associated with hormonal etiology ([place]) 06/19/2015  . Hypomagnesemia 06/19/2015  . AV BLOCK, 1ST DEGREE 07/02/2010  . PPM-[employer] 01/13/2009    Past Surgical History:  Procedure Laterality Date  . 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 BREAST LUMPECTOMY    . CESAREAN SECTION  1983  . EYE SURGERY Left 12/2018   cataracts-Dr.Shah in [place]   . EYE SURGERY Right 01/2019   cataracts- Dr.Shah in [place]  . [place]  . INSERT / REPLACE / REMOVE PACEMAKER  2011   revision @ [facility]  05/13/2003   Maeola Sarah - DR    Prior to Admission medications   Medication Sig Start Date End Date Taking? Authorizing Provider  HYDROcodone-acetaminophen (NORCO) 5-325 MG tablet Take 2 tablets by mouth every 6 (six) hours as needed for up to 3 days for moderate pain. 09/22/20 09/25/20 Yes Lucrezia Starch, MD  ondansetron (ZOFRAN) 4 MG tablet Take 1 tablet (4 mg total) by mouth every 8 (eight) hours as needed for up to 10 doses for nausea or vomiting. 09/22/20  Yes Lucrezia Starch, MD  tamsulosin (FLOMAX) 0.4 MG CAPS capsule Take 1 capsule (0.4 mg total) by mouth daily for 3 days. 09/22/20 09/25/20 Yes Lucrezia Starch, MD  aMILoride (MIDAMOR) 5 MG tablet Take 1 tablet (5 mg  total) by mouth daily. 12/15/19   [name], [name], NP  atenolol (TENORMIN) 50 MG tablet Take 1 tablet (50 mg total) by mouth daily. 08/11/20   [name], [name], NP  atorvastatin (LIPITOR) 40 MG tablet Take 1 tablet (40 mg total) by mouth daily. 08/11/20   [name], [name], NP  azelastine (ASTELIN) 0.1 % nasal spray Place into both nostrils 2 (two) times daily. Use in each nostril as directed    [provider]   chlorpheniramine-HYDROcodone (TUSSIONEX PENNKINETIC ER) 10-8 MG/5ML SUER Take 5 mLs by mouth every 12 (twelve) hours as needed for cough. 09/12/20   [name], [name], NP  CINNAMON PO Take by mouth daily.    [provider]  dextromethorphan-guaiFENesin (MUCINEX DM) 30-600 MG 12hr tablet Take 1 tablet by mouth 2 (two) times daily.    [provider]  doxycycline (VIBRA-TABS) 100 MG tablet Take 1 tablet (100 mg total) by mouth 2 (two) times daily. 09/12/20   [name], [name], NP  esomeprazole (NEXIUM) 20 MG capsule Take 20 mg by mouth daily.    [provider]  fluticasone (FLONASE) 50 MCG/ACT nasal spray Place 2 sprays into both nostrils daily. 10/13/19   [name], [name], NP  fluticasone (FLOVENT HFA) 110 MCG/ACT inhaler Inhale into the lungs 2 (two) times daily.    [provider]  glucose blood (ACCU-CHEK AVIVA PLUS) test strip TEST BLOOD SUGAR ONCE DAILY 09/21/18   [place] Maple, MD  levocetirizine (XYZAL) 5 MG tablet Take 5 mg by mouth every evening.    [provider]  magnesium oxide (MAG-OX) 400 MG tablet Take 400 mg by mouth 2 (two) times daily.    [provider]  melatonin 3 MG TABS tablet Take 3 mg by mouth at bedtime.    [provider]  meloxicam (MOBIC) 15 MG tablet Take 1 tablet by mouth daily. 06/07/20   [provider]  metFORMIN (GLUCOPHAGE) 500 MG tablet Take 1 tablet (500 mg total) by mouth 2 (two) times daily with a meal. 08/11/20   [name], [name], NP  montelukast (SINGULAIR) 10 MG tablet Take 1 tablet (10 mg total) by mouth at bedtime. 08/11/20   [name], [name], NP  nystatin (MYCOSTATIN/NYSTOP) powder Apply 1 application topically 2 (two) times daily. 02/03/20   [name], [name], NP  pregabalin (LYRICA) 50 MG capsule Take 1 capsule (50 mg total) by mouth 3 (three) times daily. 08/22/20   [name], [name], NP  SUMAtriptan (IMITREX) 100 MG tablet Take 1 tablet (100 mg total) by mouth as needed.  03/16/18   [place] Maple, MD    Allergies Penicillins and Levofloxacin  Family History  Problem Relation Age of Onset  . Diabetes Mother   . Heart disease Mother   . Thyroid disease Mother   . Hypertension Father   . Cancer Father   . Alcohol abuse Father   . Diabetes Father   . Stroke Other        Family hx of CVA or stroke  . Diabetes Brother   . Diabetes Daughter   . Diabetes Paternal Uncle   . Diabetes Paternal Grandmother   . Breast cancer Maternal Aunt     Social History Social History   Tobacco Use  . Smoking status: Former Smoker    Quit date: 08/26/1981    Years since quitting: 39.1  . Smokeless tobacco: Never Used  Vaping Use  . Vaping Use: Never used  Substance Use Topics  .  Alcohol use: Not Currently  . Drug use: No    Review of Systems  Review of Systems  Constitutional: Negative for chills and fever.  HENT: Negative for sore throat.   Eyes: Negative for pain.  Respiratory: Negative for cough and stridor.   Cardiovascular: Negative for chest pain.  Gastrointestinal: Positive for nausea. Negative for vomiting.  Genitourinary: Negative for dysuria.  Musculoskeletal: Positive for back pain. Negative for myalgias.  Skin: Negative for rash.  Neurological: Negative for seizures, loss of consciousness and headaches.  Psychiatric/Behavioral: Negative for suicidal ideas.  All other systems reviewed and are negative.     ____________________________________________   PHYSICAL EXAM:  VITAL SIGNS: ED Triage Vitals  Enc Vitals Group     BP 09/22/20 0816 (!) 156/69     Pulse Rate 09/22/20 0816 69     Resp 09/22/20 0816 16     Temp 09/22/20 0816 98 F (36.7 C)     Temp Source 09/22/20 0816 Oral     SpO2 09/22/20 0816 99 %     Weight 09/22/20 0803 209 lb (94.8 kg)     Height 09/22/20 0803 '5\' 3"'  (1.6 m)     Head Circumference --      Peak Flow --      Pain Score 09/22/20 0803 10     Pain Loc --      Pain Edu? --      Excl. in [place]? --     Vitals:   09/22/20 1237 09/22/20 1238  BP: (!) 100/56   Pulse: 74   Resp: 16   Temp:    SpO2: 96% 96%   Physical Exam Vitals and nursing note reviewed.  Constitutional:      General: She is not in acute distress.    Appearance: She is well-developed and well-nourished.  HENT:     Head: Normocephalic and atraumatic.     Right Ear: External ear normal.     Left Ear: External ear normal.     Nose: Nose normal.     Mouth/Throat:     Mouth: Mucous membranes are dry.  Eyes:     Conjunctiva/sclera: Conjunctivae normal.  Cardiovascular:     Rate and Rhythm: Normal rate and regular rhythm.     Heart sounds: No murmur heard.   Pulmonary:     Effort: Pulmonary effort is normal. No respiratory distress.     Breath sounds: Normal breath sounds.  Abdominal:     Palpations: Abdomen is soft.     Tenderness: There is no abdominal tenderness. There is no right CVA tenderness or left CVA tenderness.  Musculoskeletal:        General: No edema.     Cervical back: Neck supple.  Skin:    General: Skin is warm and dry.     Capillary Refill: Capillary refill takes less than 2 seconds.  Neurological:     Mental Status: She is alert and oriented to person, place, and time.  Psychiatric:        Mood and Affect: Mood and affect and mood normal.      ____________________________________________   LABS (all labs ordered are listed, but only abnormal results are displayed)  Labs Reviewed  URINALYSIS, COMPLETE (UACMP) WITH MICROSCOPIC - Abnormal; Notable for the following components:      Result Value   Color, Urine YELLOW (*)    APPearance CLEAR (*)    All other components within normal limits  BASIC METABOLIC PANEL - Abnormal; Notable for the  following components:   Glucose, Bld 158 (*)    BUN 36 (*)    Creatinine, Ser 1.24 (*)    GFR, Estimated 47 (*)    All other components within normal limits  CBC - Abnormal; Notable for the following components:   WBC 11.4 (*)    All other  components within normal limits   ____________________________________________  ____________________________________________  RADIOLOGY  ED MD interpretation: Left-sided 6 x 5 mm kidney stone at the ureterovesicular junction with some moderate hydro on the left.  There is also some left perinephric stranding.  No other acute intra-abdominal pelvic pathology.  Official radiology report(s): CT Renal Stone Study  Result Date: 09/22/2020 CLINICAL DATA:  Flank pain EXAM: CT ABDOMEN AND PELVIS WITHOUT CONTRAST TECHNIQUE: Multidetector CT imaging of the abdomen and pelvis was performed following the standard protocol without oral or IV contrast. COMPARISON:  Jan 04, 2008 FINDINGS: Lower chest: Lung bases are clear. Pacemaker leads are attached to the right atrium and right ventricle. Hepatobiliary: No focal liver lesions are appreciable. Gallbladder is absent. There is no appreciable biliary duct dilatation allowing for post cholecystectomy state. Pancreas: There is no pancreatic mass or inflammatory focus. Spleen: No splenic lesions are evident. Adrenals/Urinary Tract: Adrenals bilaterally appear unremarkable. There is perinephric fluid and soft tissue stranding on the left. There is no appreciable renal mass on either side. There is moderate hydronephrosis on the left. There is no appreciable hydronephrosis on the right. There is dilatation of the left ureter on the left to the level of the left ureterovesical junction where there is a 6 x 5 mm calculus. No other ureteral calculi are evident. The urinary bladder is midline with wall thickness within normal limits. Stomach/Bowel: There is no appreciable bowel wall or mesenteric thickening. There is no evident bowel obstruction. The terminal ileum appears unremarkable. There is no evident free air or portal venous air. Vascular/Lymphatic: There is no abdominal aortic aneurysm. There is aortic and iliac artery atherosclerotic vascular calcification. There is no  appreciable adenopathy in the abdomen or pelvis. Reproductive: Uterus is anteverted. There is again noted an apparent dermoid arising from the right ovary measuring 3.2 x 3.0 cm. There is predominant fat within this dermoid. No other adnexal mass evident. Other: Appendix appears unremarkable. No evident abscess or ascites in the abdomen or pelvis. Musculoskeletal: There are multiple foci of degenerative change in the lumbar spine. There is spinal stenosis at L4-5 due to diffuse disc protrusion and bony hypertrophy. No blastic or lytic bone lesions are evident. No intramuscular or abdominal wall lesions are evident. IMPRESSION: 1. 6 x 5 mm calculus at the left ureterovesical junction with moderate hydronephrosis on the left. There is left perinephric soft tissue stranding and fluid. There is also moderate ureterectasis on the left. 2.  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 3. No evident bowel obstruction. No abscess in the abdomen or pelvis. Appendix appears normal. 4. Spinal stenosis at L4-5 due to disc protrusion and bony hypertrophy. 5.  Aortic Atherosclerosis (ICD10-I70.0). 6.  Absent gallbladder. Electronically Signed   By: Lowella Grip III M.D.   On: 09/22/2020 12:14    ____________________________________________   PROCEDURES  Procedure(s) performed (including Critical Care):  .1-3 Lead EKG Interpretation Performed by: Lucrezia Starch, MD Authorized by: Lucrezia Starch, MD     Interpretation: normal     ECG rate assessment: normal     Rhythm: sinus rhythm     Ectopy: none     Conduction:  normal       ____________________________________________   INITIAL IMPRESSION / ASSESSMENT AND PLAN / ED COURSE      Patient presents with above-stated exam for assessment of nausea, urinary frequency and urgency as well as left lower back pain and flank pain.  She is afebrile and hemodynamically stable on arrival.  Differential) not limited to infectious pyelonephritis,  kidney stone, cystitis, diverticulitis, SBO, pancreatitis and enteritis.  CT obtained does show evidence of stone as described above as well as some perinephric stranding and moderate hydro-.  No evidence of diverticulitis, SBO, pancreatitis and patient is known to have an absent gallbladder.  She is also noted to have some atherosclerosis as well as spinal stenosis at L4-L5.  She also has a ovarian dermoid cyst on the right.  I discussed this with the patient and recommended outpatient follow-up as well as possible outpatient ultrasound.  No evidence of pancreatitis on CT.  BMP shows no significant electrolyte or metabolic derangements.  Patient has some mild dehydration as her creatinine is 1.24 compared to 0.1-monthago suspect is secondary to some decreased p.o. intake in the last 12 hours.  CBC has mild leukocytosis but otherwise unremarkable.  UA is unremarkable without evidence of blood or infection.  Low suspicion for infectious pyelonephritis given absence of fever or findings on UA suggestive of infected urine.  Suspect likely mild inflammation secondary to kidney stone.  Will defer antibiotics at this time.  Patient treated with below noted IV fluids analgesia and antiemetics.  On reassessment he states he felt much better.  Discharge stable condition.  Strict return precautions advised and discussed.  Limited medications written for discharge.  Advise close outpatient follow-up.         ____________________________________________   FINAL CLINICAL IMPRESSION(S) / ED DIAGNOSES  Final diagnoses:  Kidney stone  Mild dehydration    Medications  lactated ringers bolus 1,000 mL (0 mLs Intravenous Stopped 09/22/20 1251)  HYDROmorphone (DILAUDID) injection 0.5 mg (0.5 mg Intravenous Given 09/22/20 1229)  ketorolac (TORADOL) 30 MG/ML injection 30 mg (30 mg Intravenous Given 09/22/20 1239)     ED Discharge Orders         Ordered    HYDROcodone-acetaminophen (NORCO) 5-325 MG tablet   Every 6 hours PRN        09/22/20 1243    ondansetron (ZOFRAN) 4 MG tablet  Every 8 hours PRN        09/22/20 1243    tamsulosin (FLOMAX) 0.4 MG CAPS capsule  Daily        09/22/20 1243           Note:  This document was prepared using Dragon voice recognition software and may include unintentional dictation errors.   SLucrezia Starch MD 09/22/20 [id number]

## 2020-09-22 NOTE — ED Notes (Addendum)
To bedside to complete I&O cath. Pt not present but urine sample in cup on bedside table with label attached. Sent sample to lab. Will give dilaudid and LR once pt back to room.

## 2020-09-22 NOTE — ED Notes (Signed)
Pt c/o pain radiating from L lower back into L lower abdomen. Reports tries to urinate often due to feeling that she needs to but being unable to urinate typical amount. Resp reg/unlabored. Skin dry. Sitting calmly in bed.

## 2020-09-22 NOTE — ED Triage Notes (Signed)
Pt comes into the ED via POV c/o possible UTI with urinary frequency and lower back pain that developed last night.  Pt has even and unlabored respirations and is in NAD at this time.  Pt also starting to have N/V.

## 2020-09-29 ENCOUNTER — Encounter: Payer: Medicare PPO | Admitting: Nurse Practitioner

## 2020-10-04 ENCOUNTER — Encounter: Payer: Self-pay | Admitting: Nurse Practitioner

## 2020-10-04 ENCOUNTER — Ambulatory Visit: Payer: Medicare PPO | Admitting: Nurse Practitioner

## 2020-10-04 ENCOUNTER — Other Ambulatory Visit: Payer: Self-pay

## 2020-10-04 VITALS — BP 126/84 | HR 75 | Temp 98.5°F | Wt 225.0 lb

## 2020-10-04 DIAGNOSIS — N200 Calculus of kidney: Secondary | ICD-10-CM | POA: Insufficient documentation

## 2020-10-04 DIAGNOSIS — N83201 Unspecified ovarian cyst, right side: Secondary | ICD-10-CM

## 2020-10-04 NOTE — Patient Instructions (Signed)

## 2020-10-04 NOTE — Addendum Note (Signed)
Addended by: Marnee Guarneri T on: 10/04/2020 04:54 PM   Modules accepted: Orders

## 2020-10-04 NOTE — Assessment & Plan Note (Signed)
Acute and passed -- will send stone for assessment.  Has history of kidney stones years ago, 65 years.  At this time is improving, minimal discomfort.  Will recheck BMP and CBC today to ensure improvement in kidney function and trend down of WBC.  Return as scheduled in March for follow-up, sooner if return of symptoms.

## 2020-10-04 NOTE — Progress Notes (Addendum)
BP 126/84 (BP Location: Right Leg, Cuff Size: Normal)   Pulse 75   Temp 98.5 F (36.9 C) (Oral)   Wt 225 lb (102.1 kg)   SpO2 98%   BMI 39.86 kg/m    Subjective:    Patient ID: [name], female    DOB: 06/14/1951, 70 y.o.   MRN: [id number]  HPI: [name] is a 70 y.o. female  Chief Complaint  Patient presents with  . Follow-up    Pt states she is having problems with bowel movements since having kidney stone. Pt states she is having a little bit of problems with constipation, but they are slowly get back to normal.   ER FOLLOW UP Seen at [facility] ED on 09/22/20 for flank pain and diagnosed with kidney stone: CT imaging noting -- "6 x 5 mm calculus at the left ureterovesical junction with moderate hydronephrosis on the left. There is left perinephric soft tissue stranding and fluid. There is also moderate ureterectasis on the left. 2.  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 3. No evident bowel obstruction. No abscess in the abdomen or pelvis. Appendix appears normal.".  Tamsulosin and Norco provided outpatient -- 3 days worth.  Labs noted mild elevation in CRT from baseline at 1.24 and GFR 47, CBC mild elevation in WBC at 11.4.  She has history of multiple kidney stones when younger, but none recent.    She did pass x 1 stone, brought to visit today -- passed 09/28/20.  Reports ongoing mild discomfort to left lower abdomen and left flank -- this is improving.  States since passing kidney stones, having bowel issues.  She was constipated during time of trying to pass kidney stone and now looser stool and more frequent.  At baseline passes bowels daily.  This is improving now. Time since discharge: 12 days Hospital/facility: [facility] Diagnosis: Left kidney stone Procedures/tests: imaging and labs Consultants: none New medications: as noted above Discharge instructions:  Follow-up with PCP Status: stable  Relevant past medical, surgical, family and social history reviewed and  updated as indicated. Interim medical history since our last visit reviewed. Allergies and medications reviewed and updated.  Review of Systems  Constitutional: Negative for activity change, appetite change, diaphoresis, fatigue and fever.  Respiratory: Negative for cough, chest tightness and shortness of breath.   Cardiovascular: Negative for chest pain, palpitations and leg swelling.  Gastrointestinal: Negative.   Genitourinary: Positive for flank pain (improving). Negative for dysuria, frequency, hematuria and urgency.  Neurological: Negative.   Psychiatric/Behavioral: Negative.     Per HPI unless specifically indicated above     Objective:    BP 126/84 (BP Location: Right Leg, Cuff Size: Normal)   Pulse 75   Temp 98.5 F (36.9 C) (Oral)   Wt 225 lb (102.1 kg)   SpO2 98%   BMI 39.86 kg/m   Wt Readings from Last 3 Encounters:  10/04/20 225 lb (102.1 kg)  09/22/20 209 lb (94.8 kg)  08/11/20 223 lb 12.8 oz (101.5 kg)    Physical Exam Vitals and nursing note reviewed.  Constitutional:      General: She is awake.     Appearance: She is well-developed.  HENT:     Head: Normocephalic.     Right Ear: Hearing normal.     Left Ear: Hearing normal.     Nose: Nose normal.     Mouth/Throat:     Mouth: Mucous membranes are moist.  Eyes:  General: Lids are normal.        Right eye: No discharge.        Left eye: No discharge.     Conjunctiva/sclera: Conjunctivae normal.     Pupils: Pupils are equal, round, and reactive to light.  Neck:     Thyroid: No thyromegaly.     Vascular: No carotid bruit or JVD.  Cardiovascular:     Rate and Rhythm: Normal rate and regular rhythm.     Heart sounds: Normal heart sounds. No murmur heard. No gallop.   Pulmonary:     Effort: Pulmonary effort is normal.     Breath sounds: Normal breath sounds.  Abdominal:     General: Bowel sounds are normal.     Palpations: Abdomen is soft. There is no hepatomegaly or splenomegaly.      Tenderness: There is no abdominal tenderness. There is no right CVA tenderness or left CVA tenderness.  Musculoskeletal:     Cervical back: Normal range of motion and neck supple.     Right lower leg: No edema.     Left lower leg: No edema.  Lymphadenopathy:     Cervical: No cervical adenopathy.  Skin:    General: Skin is warm and dry.  Neurological:     Mental Status: She is alert and oriented to person, place, and time.  Psychiatric:        Attention and Perception: Attention normal.        Mood and Affect: Mood normal.        Behavior: Behavior normal. Behavior is cooperative.        Thought Content: Thought content normal.        Judgment: Judgment normal.     Results for orders placed or performed during the hospital encounter of 09/22/20  Urinalysis, Complete w Microscopic  Result Value Ref Range   Color, Urine YELLOW (A) YELLOW   APPearance CLEAR (A) CLEAR   Specific Gravity, Urine 1.023 1.005 - 1.030   pH 5.0 5.0 - 8.0   Glucose, UA NEGATIVE NEGATIVE mg/dL   Hgb urine dipstick NEGATIVE NEGATIVE   Bilirubin Urine NEGATIVE NEGATIVE   Ketones, ur NEGATIVE NEGATIVE mg/dL   Protein, ur NEGATIVE NEGATIVE mg/dL   Nitrite NEGATIVE NEGATIVE   Leukocytes,Ua NEGATIVE NEGATIVE   RBC / HPF 0-5 0 - 5 RBC/hpf   WBC, UA 0-5 0 - 5 WBC/hpf   Bacteria, UA NONE SEEN NONE SEEN   Squamous Epithelial / LPF 0-5 0 - 5   Mucus PRESENT   Basic metabolic panel  Result Value Ref Range   Sodium 143 135 - 145 mmol/L   Potassium 4.7 3.5 - 5.1 mmol/L   Chloride 107 98 - 111 mmol/L   CO2 26 22 - 32 mmol/L   Glucose, Bld 158 (H) 70 - 99 mg/dL   BUN 36 (H) 8 - 23 mg/dL   Creatinine, Ser 1.24 (H) 0.44 - 1.00 mg/dL   Calcium 9.0 8.9 - 10.3 mg/dL   GFR, Estimated 47 (L) >60 mL/min   Anion gap 10 5 - 15  CBC  Result Value Ref Range   WBC 11.4 (H) 4.0 - 10.5 K/uL   RBC 4.31 3.87 - 5.11 MIL/uL   Hemoglobin 12.7 12.0 - 15.0 g/dL   HCT 38.5 36.0 - 46.0 %   MCV 89.3 80.0 - 100.0 fL   MCH 29.5  26.0 - 34.0 pg   MCHC 33.0 30.0 - 36.0 g/dL   RDW 13.4 11.5 - 15.5 %  Platelets 174 150 - 400 K/uL   nRBC 0.0 0.0 - 0.2 %      Assessment & Plan:   Problem List Items Addressed This Visit      Endocrine   Cyst of right ovary    Noted on recent CT, will obtain ultrasound of area to further assess due to her history of breast CA.      Relevant Orders   US Pelvis Limited     Genitourinary   Kidney stone on left side - Primary    Acute and passed -- will send stone for assessment.  Has history of kidney stones years ago, 63 years.  At this time is improving, minimal discomfort.  Will recheck BMP and CBC today to ensure improvement in kidney function and trend down of WBC.  Return as scheduled in March for follow-up, sooner if return of symptoms.      Relevant Orders   CBC with Differential/Platelet   Basic metabolic panel   Calculi, with Photograph       Follow up plan: Return for as scheduled in March for physical.

## 2020-10-04 NOTE — Assessment & Plan Note (Signed)
Noted on recent CT, will obtain ultrasound of area to further assess due to her history of breast CA.

## 2020-10-05 LAB — CBC WITH DIFFERENTIAL/PLATELET
Basophils Absolute: 0.1 10*3/uL (ref 0.0–0.2)
Basos: 1 %
EOS (ABSOLUTE): 0.3 10*3/uL (ref 0.0–0.4)
Eos: 4 %
Hematocrit: 35.8 % (ref 34.0–46.6)
Hemoglobin: 11.9 g/dL (ref 11.1–15.9)
Immature Grans (Abs): 0.1 10*3/uL (ref 0.0–0.1)
Immature Granulocytes: 1 %
Lymphocytes Absolute: 2.1 10*3/uL (ref 0.7–3.1)
Lymphs: 30 %
MCH: 28.8 pg (ref 26.6–33.0)
MCHC: 33.2 g/dL (ref 31.5–35.7)
MCV: 87 fL (ref 79–97)
Monocytes Absolute: 0.5 10*3/uL (ref 0.1–0.9)
Monocytes: 7 %
Neutrophils Absolute: 4 10*3/uL (ref 1.4–7.0)
Neutrophils: 57 %
Platelets: 240 10*3/uL (ref 150–450)
RBC: 4.13 x10E6/uL (ref 3.77–5.28)
RDW: 12.9 % (ref 11.7–15.4)
WBC: 7.1 10*3/uL (ref 3.4–10.8)

## 2020-10-05 LAB — BASIC METABOLIC PANEL
BUN/Creatinine Ratio: 20 (ref 12–28)
BUN: 19 mg/dL (ref 8–27)
CO2: 21 mmol/L (ref 20–29)
Calcium: 9.7 mg/dL (ref 8.7–10.3)
Chloride: 102 mmol/L (ref 96–106)
Creatinine, Ser: 0.95 mg/dL (ref 0.57–1.00)
GFR calc Af Amer: 70 mL/min/{1.73_m2} (ref >59)
GFR calc non Af Amer: 61 mL/min/{1.73_m2} (ref >59)
Glucose: 108 mg/dL — ABNORMAL HIGH (ref 65–99)
Potassium: 5.4 mmol/L — ABNORMAL HIGH (ref 3.5–5.2)
Sodium: 141 mmol/L (ref 134–144)

## 2020-10-05 NOTE — Progress Notes (Signed)
Contacted via MyChart   Good afternoon Gorgeous, your labs have returned and show improved white blood cell count to normal now.  Kidney function is now improved since passing kidney stone, but potassium mildly elevated.  Recommend increasing water intake and decreasing foods rich in potassium, like bananas, mangoes, dried fruit, potatoes -- we will recheck this at physical.  Any questions? Keep being awesome!!  Thank you for allowing me to participate in your care. Kindest regards, Rogue Rafalski

## 2020-10-10 LAB — CALCULI, WITH PHOTOGRAPH (CLINICAL LAB)
Calcium Oxalate Monohydrate: 100 %
Weight Calculi: 66 mg

## 2020-10-11 ENCOUNTER — Other Ambulatory Visit: Payer: Self-pay

## 2020-10-11 ENCOUNTER — Ambulatory Visit
Admission: RE | Admit: 2020-10-11 | Discharge: 2020-10-11 | Disposition: A | Discharge status: Home / Self Care | Payer: Medicare PPO | Source: Ambulatory Visit | Attending: Nurse Practitioner | Admitting: Nurse Practitioner

## 2020-10-11 DIAGNOSIS — N83201 Unspecified ovarian cyst, right side: Secondary | ICD-10-CM | POA: Diagnosis present

## 2020-10-12 ENCOUNTER — Other Ambulatory Visit: Payer: Self-pay | Admitting: Nurse Practitioner

## 2020-10-12 DIAGNOSIS — D270 Benign neoplasm of right ovary: Secondary | ICD-10-CM

## 2020-10-12 NOTE — Progress Notes (Signed)
Contacted via MyChart   Good afternoon [name], your ultrasound returned.  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  However, this corresponds with CT.  We could send you to GYN to see if removal of this would be an advisable option with your history of breast CA.  Would you like to have visit with them?  Let me know. Keep being awesome!!  Thank you for allowing me to participate in your care. Kindest regards, Hung Rhinesmith

## 2020-10-24 ENCOUNTER — Other Ambulatory Visit: Payer: Self-pay

## 2020-10-24 ENCOUNTER — Ambulatory Visit: Payer: Medicare PPO | Admitting: Internal Medicine

## 2020-10-24 ENCOUNTER — Encounter: Payer: Self-pay | Admitting: Internal Medicine

## 2020-10-24 VITALS — BP 122/68 | HR 77 | Ht 63.0 in | Wt 229.0 lb

## 2020-10-24 DIAGNOSIS — I440 Atrioventricular block, first degree: Secondary | ICD-10-CM | POA: Diagnosis not present

## 2020-10-24 DIAGNOSIS — Z950 Presence of cardiac pacemaker: Secondary | ICD-10-CM | POA: Diagnosis not present

## 2020-10-24 NOTE — Patient Instructions (Signed)

## 2020-10-24 NOTE — Progress Notes (Signed)
Man    Electrophysiology Office Note   Date:  10/24/2020   ID:  Arielys, [place] July 18, 1951, MRN [id number]  PCP:  Venita Lick, NP  Cardiologist:  none Primary Electrophysiologist:    Virl Axe, MD    Chief Complaint  Patient presents with  . Annual Exam    Pacer Check     History of Present Illness: [name] is a 70 y.o. 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 for which she underwent pacing remotely w/ generator replacement Dec 2011   Hx of breast CA 2000's   Echo 06/2010 normal LV function   Some interval sinus infection and kidney stones  Dyspnea on exertion is chronic and stable; some peripheral edema.  Some nocturnal wheezing.         Past Medical History:  Diagnosis Date  . Breast CA ([place])    Chemo/XRT/lumpectomy  . Breast cancer ([place]) 09/2007   left breast, radiation, chemo  . Cardiomyopathy secondary   . Hypersomnolence 09/21/2013  . Leg edema, left 09/21/2013  . 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 Pacemaker [employer]   . Sinus tachycardia   . Type 2 diabetes mellitus ([place]) 06/19/2015   Past Surgical History:  Procedure Laterality Date  . 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 BREAST LUMPECTOMY    . CESAREAN SECTION  1983  . CHOLECYSTECTOMY    . EYE SURGERY Left 12/2018   cataracts-Dr.Shah in [place]   . EYE SURGERY Right 01/2019   cataracts- Dr.Shah in [place]  . [place]  . INSERT / REPLACE / REMOVE PACEMAKER  2011   revision @ [place]  05/13/2003   Maeola Sarah - DR    Current Outpatient Medications on File Prior to Visit  Medication Sig Dispense Refill  . aMILoride (MIDAMOR) 5 MG tablet Take 1 tablet (5 mg total) by mouth daily. 90 tablet 4  . atenolol (TENORMIN) 50 MG tablet Take 1 tablet (50 mg  total) by mouth daily. 90 tablet 4  . atorvastatin (LIPITOR) 40 MG tablet Take 1 tablet (40 mg total) by mouth daily. 90 tablet 4  . azelastine (ASTELIN) 0.1 % nasal spray Place into both nostrils 2 (two) times daily. Use in each nostril as directed    . chlorpheniramine-HYDROcodone (TUSSIONEX PENNKINETIC ER) 10-8 MG/5ML SUER Take 5 mLs by mouth every 12 (twelve) hours as needed for cough. 60 mL 0  . CINNAMON PO Take by mouth daily.    Marland Kitchen esomeprazole (NEXIUM) 20 MG capsule Take 20 mg by mouth daily.    . fluticasone (FLONASE) 50 MCG/ACT nasal spray Place 2 sprays into both nostrils daily. 16 g 12  . fluticasone (FLOVENT HFA) 110 MCG/ACT inhaler Inhale into the lungs 2 (two) times daily.    Marland Kitchen glucose blood (ACCU-CHEK AVIVA PLUS) test strip TEST BLOOD SUGAR ONCE DAILY 100 each 12  . levocetirizine (XYZAL) 5 MG tablet Take 5 mg by mouth every evening.    . magnesium oxide (MAG-OX) 400 MG tablet Take 400 mg by mouth 2 (two) times daily.    . melatonin 3 MG TABS tablet Take 3 mg by mouth at bedtime.    . meloxicam (MOBIC) 15 MG tablet Take 1 tablet by mouth daily.    . metFORMIN (GLUCOPHAGE) 500 MG tablet  Take 1 tablet (500 mg total) by mouth 2 (two) times daily with a meal. 180 tablet 4  . montelukast (SINGULAIR) 10 MG tablet Take 1 tablet (10 mg total) by mouth at bedtime. 30 tablet 3  . nystatin (MYCOSTATIN/NYSTOP) powder Apply 1 application topically 2 (two) times daily. 15 g 4  . pregabalin (LYRICA) 50 MG capsule Take 1 capsule (50 mg total) by mouth 3 (three) times daily. 270 capsule 1  . SUMAtriptan (IMITREX) 100 MG tablet Take 1 tablet (100 mg total) by mouth as needed. 10 tablet 12   No current facility-administered medications on file prior to visit.     Allergies:   Penicillins and Levofloxacin      PHYSICAL EXAM: VS:  BP 122/68   Pulse 77   Ht '5\' 3"'  (1.6 m)   Wt 229 lb (103.9 kg)   BMI 40.57 kg/m  , BMI Body mass index is 40.57 kg/m. Well developed and well nourished in no  acute distress HENT normal Neck supple with JVP-flat Clear Device pocket well healed; without hematoma or erythema.  There is no tethering  Regular rate and rhythm, no  gallop No / murmur Abd-soft with active BS No Clubbing cyanosis  edema Skin-warm and dry A & Oriented  Grossly normal sensory and motor function  ECG sinus at 77 Interval 44/08/37 ASSESSMENT AND PLAN: Dyspnea on Exertion/HFpEF  1 AV block--intermittent  Hypertension  Diabetes  Pacemaker [employer]     Vpacing decreased 30>>18  Dyspnea stable; but euvolemic.  May be some degree of HFpEF, may benefit from pulmonary evaluation.  Clearly contribution from her obesity  Ventricular pacing less than 20%  Blood pressure well controlled       Signed, Virl Axe, MD  10/24/2020 11:30 AM     [facility] [address] [place] [address] [phone] (office) [phone] (fax)

## 2020-10-27 ENCOUNTER — Encounter: Payer: Self-pay | Admitting: Obstetrics and Gynecology

## 2020-10-27 ENCOUNTER — Other Ambulatory Visit: Payer: Self-pay

## 2020-10-27 ENCOUNTER — Ambulatory Visit (INDEPENDENT_AMBULATORY_CARE_PROVIDER_SITE_OTHER): Payer: Medicare PPO | Admitting: Obstetrics and Gynecology

## 2020-10-27 VITALS — BP 140/80 | Ht 63.0 in | Wt 229.8 lb

## 2020-10-27 DIAGNOSIS — N83201 Unspecified ovarian cyst, right side: Secondary | ICD-10-CM

## 2020-10-27 NOTE — Progress Notes (Signed)
Patient ID: [name], female   DOB: 11/07/1950, 70 y.o.   MRN: [id number]  Reason for Consult: Gynecologic Exam   Referred by Venita Lick, NP  Subjective:     HPI:  [name] is a 70 y.o. female. She had a CT scan for a renal stone on 09/22/2020 which showed a right dermoid. cyst.    Follow up US on 10/11/2020 showed:   Hyperechoic mass with shadowing identified in RIGHT adnexa 4.4 x 3.1 x 3.2 cm consistent with a dermoid tumor; this measured 3.2 x 3.0 x 3.9 cm on recent CT. No calcifications Evident.  She has a personal history of breast cancer. She was diagnosed in 2009. She underwent a lumpectomy, chemotherapy radiation and took herceptin for Her2 Neu + status.   Gynecological History Menarche: 14 Menopause: 66s Last pap smear: 2016 NIL Last Mammogram: 01/14/2020- [facility] 1 History of STDs: denies Sexually Active: no  Obstetrical History OB History  Gravida Para Term Preterm AB Living  '2 1   1   2  ' SAB IAB Ectopic Multiple Live Births          2    # Outcome Date GA Lbr Len/2nd Weight Sex Delivery Anes PTL Lv  2 Preterm 06/28/86    M Vag-Spont   LIV  1 Gravida 04/19/82    F CS-Unspec   LIV     Past Medical History:  Diagnosis Date  . Breast CA ([place])    Chemo/XRT/lumpectomy  . Breast cancer ([place]) 09/2007   left breast, radiation, chemo  . Cardiomyopathy secondary   . Hypersomnolence 09/21/2013  . Leg edema, left 09/21/2013  . 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 Pacemaker [employer]   . Sinus tachycardia   . Type 2 diabetes mellitus ([place]) 06/19/2015   Family History  Problem Relation Age of Onset  . Diabetes Mother   . Heart disease Mother   . Thyroid disease Mother   . Hypertension Father   . Cancer Father   . Alcohol abuse Father   . Diabetes Father   . Stroke Other        Family hx of CVA or stroke  . Diabetes Brother   . Diabetes Daughter   . Diabetes Paternal Uncle   . Diabetes Paternal Grandmother   . Breast  cancer Maternal Aunt    Past Surgical History:  Procedure Laterality Date  . 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 BREAST LUMPECTOMY    . CESAREAN SECTION  1983  . CHOLECYSTECTOMY    . EYE SURGERY Left 12/2018   cataracts-Dr.Shah in [place]   . EYE SURGERY Right 01/2019   cataracts- Dr.Shah in [place]  . [place]  . INSERT / REPLACE / REMOVE PACEMAKER  2011   revision @ [facility]  05/13/2003   [employer] - DR    Short Social History:  Social History   Tobacco Use  . Smoking status: Former Smoker    Quit date: 08/26/1981    Years since quitting: 39.1  . Smokeless tobacco: Never Used  Substance Use Topics  . Alcohol use: Not Currently    Allergies  Allergen Reactions  . Penicillins Swelling    Causes organs to swell Other reaction(s): Other (See Comments) Organs flipped over per patient; skin starting splitting open and blood vessels burst in legs;  Causes organs to swell  . Levofloxacin Swelling    Leg swelling and muscle weakness Other reaction(s): Other (See Comments) Tendon pain and weakness in muscles Leg swelling and muscle weakness    Current Outpatient Medications  Medication Sig Dispense Refill  . aMILoride (MIDAMOR) 5 MG tablet Take 1 tablet (5 mg total) by mouth daily. 90 tablet 4  . atenolol (TENORMIN) 50 MG tablet Take 1 tablet (50 mg total) by mouth daily. 90 tablet 4  . atorvastatin (LIPITOR) 40 MG tablet Take 1 tablet (40 mg total) by mouth daily. 90 tablet 4  . azelastine (ASTELIN) 0.1 % nasal spray Place into both nostrils 2 (two) times daily. Use in each nostril as directed    . chlorpheniramine-HYDROcodone (TUSSIONEX PENNKINETIC ER) 10-8 MG/5ML SUER Take 5 mLs by mouth every 12 (twelve) hours as needed for cough. 60 mL 0  . CINNAMON PO Take by mouth daily.    Marland Kitchen esomeprazole (NEXIUM) 20 MG capsule Take 20 mg  by mouth daily.    . fluticasone (FLONASE) 50 MCG/ACT nasal spray Place 2 sprays into both nostrils daily. 16 g 12  . fluticasone (FLOVENT HFA) 110 MCG/ACT inhaler Inhale into the lungs 2 (two) times daily.    Marland Kitchen glucose blood (ACCU-CHEK AVIVA PLUS) test strip TEST BLOOD SUGAR ONCE DAILY 100 each 12  . levocetirizine (XYZAL) 5 MG tablet Take 5 mg by mouth every evening.    . magnesium oxide (MAG-OX) 400 MG tablet Take 400 mg by mouth 2 (two) times daily.    . melatonin 3 MG TABS tablet Take 3 mg by mouth at bedtime.    . meloxicam (MOBIC) 15 MG tablet Take 1 tablet by mouth daily.    . metFORMIN (GLUCOPHAGE) 500 MG tablet Take 1 tablet (500 mg total) by mouth 2 (two) times daily with a meal. 180 tablet 4  . montelukast (SINGULAIR) 10 MG tablet Take 1 tablet (10 mg total) by mouth at bedtime. 30 tablet 3  . nystatin (MYCOSTATIN/NYSTOP) powder Apply 1 application topically 2 (two) times daily. 15 g 4  . pregabalin (LYRICA) 50 MG capsule Take 1 capsule (50 mg total) by mouth 3 (three) times daily. 270 capsule 1  . SUMAtriptan (IMITREX) 100 MG tablet Take 1 tablet (100 mg total) by mouth as needed. 10 tablet 12   No current facility-administered medications for this visit.    Review of Systems  Constitutional: Negative for chills, fatigue, fever and unexpected weight change.  HENT: Negative for trouble swallowing.  Eyes: Negative for loss of vision.  Respiratory: Negative for cough, shortness of breath and wheezing.  Cardiovascular: Negative for chest pain, leg swelling, palpitations and syncope.  GI: Negative for abdominal pain, blood in stool, diarrhea, nausea and vomiting.  GU: Negative for difficulty urinating, dysuria, frequency and hematuria.  Musculoskeletal: Negative for back pain, leg pain and joint pain.  Skin: Negative for rash.  Neurological: Negative for dizziness, headaches, light-headedness, numbness and seizures.  Psychiatric: Negative for behavioral problem, confusion,  depressed mood and sleep disturbance.        Objective:  Objective   Vitals:   10/27/20 1532  BP: 140/80  Weight: 229 lb 12.8 oz (104.2 kg)  Height: '5\' 3"'  (1.6 m)   Body mass index is 40.71 kg/m.  Physical Exam Vitals and nursing note reviewed. Exam conducted with a chaperone present.  Constitutional:      Appearance: Normal appearance.  HENT:     Head: Normocephalic and atraumatic.  Eyes:  Extraocular Movements: Extraocular movements intact.     Pupils: Pupils are equal, round, and reactive to light.  Cardiovascular:     Rate and Rhythm: Normal rate and regular rhythm.  Pulmonary:     Effort: Pulmonary effort is normal.     Breath sounds: Normal breath sounds.  Abdominal:     General: Abdomen is flat.     Palpations: Abdomen is soft.  Genitourinary:    Comments: Declines pelvic exam today. Musculoskeletal:     Cervical back: Normal range of motion.  Skin:    General: Skin is warm and dry.  Neurological:     General: No focal deficit present.     Mental Status: She is alert and oriented to person, place, and time.  Psychiatric:        Behavior: Behavior normal.        Thought Content: Thought content normal.        Judgment: Judgment normal.     Assessment/Plan:     70 yo with right adnexal cyst suggestive of dermoid on [place]. Given her age there is a risk of malignant transformation. Will check labs and patient will follow up next week.   More than 30 minutes were spent face to face with the patient in the room, reviewing the medical record, labs and images, and coordinating care for the patient. The plan of management was discussed in detail and counseling was provided.   Adrian Prows MD [facility] 10/27/2020 4:59 PM

## 2020-10-28 LAB — OVARIAN MALIGNANCY RISK-ROMA
Cancer Antigen (CA) 125: 27.9 U/mL (ref 0.0–38.1)
HE4: 71.9 pmol/L (ref 0.0–96.9)
Postmenopausal ROMA: 2.3
Premenopausal ROMA: 1.66 — ABNORMAL HIGH

## 2020-10-28 LAB — CANCER ANTIGEN 19-9: CA 19-9: 72 U/mL — ABNORMAL HIGH (ref 0–35)

## 2020-10-28 LAB — AFP TUMOR MARKER: AFP, Serum, Tumor Marker: 5 ng/mL (ref 0.0–8.3)

## 2020-10-28 LAB — BETA HCG QUANT (REF LAB): hCG Quant: 3 m[IU]/mL

## 2020-10-28 LAB — PREMENOPAUSAL INTERP: HIGH

## 2020-10-28 LAB — POSTMENOPAUSAL INTERP: LOW

## 2020-10-31 ENCOUNTER — Encounter: Payer: Medicare PPO | Admitting: Internal Medicine

## 2020-11-06 ENCOUNTER — Other Ambulatory Visit: Payer: Self-pay

## 2020-11-06 ENCOUNTER — Ambulatory Visit (INDEPENDENT_AMBULATORY_CARE_PROVIDER_SITE_OTHER): Payer: Medicare PPO | Admitting: Obstetrics and Gynecology

## 2020-11-06 ENCOUNTER — Encounter: Payer: Self-pay | Admitting: Obstetrics and Gynecology

## 2020-11-06 VITALS — BP 118/71 | Ht 63.5 in | Wt 231.6 lb

## 2020-11-06 DIAGNOSIS — N83201 Unspecified ovarian cyst, right side: Secondary | ICD-10-CM | POA: Diagnosis not present

## 2020-11-06 DIAGNOSIS — N83209 Unspecified ovarian cyst, unspecified side: Secondary | ICD-10-CM

## 2020-11-06 HISTORY — DX: Unspecified ovarian cyst, unspecified side: N83.209

## 2020-11-06 NOTE — Progress Notes (Signed)
Patient ID: [name], female   DOB: 01-Feb-1951, 70 y.o.   MRN: [id number]  Reason for Consult: Gynecologic Exam   Referred by Venita Lick, NP  Subjective:     HPI:  [name] is a 70 y.o. female. She is following up after her lab work regarding her right ovarian cyst seen recently on CT scan and subsequent pelvic US. She has no complaints.    Past Medical History:  Diagnosis Date  . Breast CA ([place])    Chemo/XRT/lumpectomy  . Breast cancer ([place]) 09/2007   left breast, radiation, chemo  . Cardiomyopathy secondary   . Hypersomnolence 09/21/2013  . Leg edema, left 09/21/2013  . 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 Pacemaker [employer]   . Sinus tachycardia   . Type 2 diabetes mellitus ([place]) 06/19/2015   Family History  Problem Relation Age of Onset  . Diabetes Mother   . Heart disease Mother   . Thyroid disease Mother   . Hypertension Father   . Cancer Father   . Alcohol abuse Father   . Diabetes Father   . Stroke Other        Family hx of CVA or stroke  . Diabetes Brother   . Diabetes Daughter   . Diabetes Paternal Uncle   . Diabetes Paternal Grandmother   . Breast cancer Maternal Aunt    Past Surgical History:  Procedure Laterality Date  . 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 BREAST LUMPECTOMY    . CESAREAN SECTION  1983  . CHOLECYSTECTOMY    . EYE SURGERY Left 12/2018   cataracts-Dr.Shah in [place]   . EYE SURGERY Right 01/2019   cataracts- Dr.Shah in [place]  . [place]  . INSERT / REPLACE / REMOVE PACEMAKER  2011   revision @ [place]  05/13/2003   [employer] - DR    Short Social History:  Social History   Tobacco Use  . Smoking status: Former Smoker    Quit date: 08/26/1981    Years since quitting: 39.2  . Smokeless tobacco: Never Used  Substance Use Topics  . Alcohol  use: Not Currently    Allergies  Allergen Reactions  . Penicillins Swelling    Causes organs to swell Other reaction(s): Other (See Comments) Organs flipped over per patient; skin starting splitting open and blood vessels burst in legs;  Causes organs to swell  . Levofloxacin Swelling    Leg swelling and muscle weakness Other reaction(s): Other (See Comments) Tendon pain and weakness in muscles Leg swelling and muscle weakness    Current Outpatient Medications  Medication Sig Dispense Refill  . aMILoride (MIDAMOR) 5 MG tablet Take 1 tablet (5 mg total) by mouth daily. 90 tablet 4  . atenolol (TENORMIN) 50 MG tablet Take 1 tablet (50 mg total) by mouth daily. 90 tablet 4  . atorvastatin (LIPITOR) 40 MG tablet Take 1 tablet (40 mg total) by mouth daily. 90 tablet 4  . azelastine (ASTELIN) 0.1 % nasal spray Place into both nostrils 2 (two) times daily. Use in each nostril as directed    . chlorpheniramine-HYDROcodone (TUSSIONEX PENNKINETIC ER) 10-8 MG/5ML SUER Take 5 mLs by mouth every 12 (twelve) hours as needed for cough. 60 mL 0  . CINNAMON PO Take by mouth daily.    Marland Kitchen esomeprazole (NEXIUM) 20 MG  capsule Take 20 mg by mouth daily.    . fluticasone (FLONASE) 50 MCG/ACT nasal spray Place 2 sprays into both nostrils daily. 16 g 12  . fluticasone (FLOVENT HFA) 110 MCG/ACT inhaler Inhale into the lungs 2 (two) times daily.    Marland Kitchen glucose blood (ACCU-CHEK AVIVA PLUS) test strip TEST BLOOD SUGAR ONCE DAILY 100 each 12  . levocetirizine (XYZAL) 5 MG tablet Take 5 mg by mouth every evening.    . magnesium oxide (MAG-OX) 400 MG tablet Take 400 mg by mouth 2 (two) times daily.    . melatonin 3 MG TABS tablet Take 3 mg by mouth at bedtime.    . meloxicam (MOBIC) 15 MG tablet Take 1 tablet by mouth daily.    . metFORMIN (GLUCOPHAGE) 500 MG tablet Take 1 tablet (500 mg total) by mouth 2 (two) times daily with a meal. 180 tablet 4  . montelukast (SINGULAIR) 10 MG tablet Take 1 tablet (10 mg total)  by mouth at bedtime. 30 tablet 3  . nystatin (MYCOSTATIN/NYSTOP) powder Apply 1 application topically 2 (two) times daily. 15 g 4  . pregabalin (LYRICA) 50 MG capsule Take 1 capsule (50 mg total) by mouth 3 (three) times daily. 270 capsule 1  . SUMAtriptan (IMITREX) 100 MG tablet Take 1 tablet (100 mg total) by mouth as needed. 10 tablet 12   No current facility-administered medications for this visit.    Review of Systems  Constitutional: Negative for chills, fatigue, fever and unexpected weight change.  HENT: Negative for trouble swallowing.  Eyes: Negative for loss of vision.  Respiratory: Negative for cough, shortness of breath and wheezing.  Cardiovascular: Negative for chest pain, leg swelling, palpitations and syncope.  GI: Negative for abdominal pain, blood in stool, diarrhea, nausea and vomiting.  GU: Negative for difficulty urinating, dysuria, frequency and hematuria.  Musculoskeletal: Negative for back pain, leg pain and joint pain.  Skin: Negative for rash.  Neurological: Negative for dizziness, headaches, light-headedness, numbness and seizures.  Psychiatric: Negative for behavioral problem, confusion, depressed mood and sleep disturbance.        Objective:  Objective   Vitals:   11/06/20 1501  BP: 118/71  Weight: 231 lb 9.6 oz (105.1 kg)  Height: 5' 3.5" (1.613 m)   Body mass index is 40.38 kg/m.  Physical Exam Vitals and nursing note reviewed. Exam conducted with a chaperone present.  Constitutional:      Appearance: Normal appearance. She is well-developed.  HENT:     Head: Normocephalic and atraumatic.  Eyes:     Extraocular Movements: Extraocular movements intact.     Pupils: Pupils are equal, round, and reactive to light.  Cardiovascular:     Rate and Rhythm: Normal rate and regular rhythm.  Pulmonary:     Effort: Pulmonary effort is normal. No respiratory distress.     Breath sounds: Normal breath sounds.  Abdominal:     General: Abdomen is flat.      Palpations: Abdomen is soft.  Genitourinary:    Comments: External: Normal appearing vulva. No lesions noted.  Speculum examination: deferred Bimanual examination: Uterus midline, non-tender, normal in size, shape and contour.  No CMT. No adnexal mass appreciated, limited exam with body habitus. No adnexal tenderness. Pelvis not fixed.  Musculoskeletal:        General: No signs of injury.  Skin:    General: Skin is warm and dry.  Neurological:     Mental Status: She is alert and oriented to person, place, and  time.  Psychiatric:        Behavior: Behavior normal.        Thought Content: Thought content normal.        Judgment: Judgment normal.      Assessment/Plan:     70 yo with right ovarian cyst and history of breast cancer.  IOTA Adnex model shows 90.5% chance of benign cyst and 9.5% risk of malignancy.  Given her age and risk of malignant transformation associated with postmenopausal dermoid cysts I would recommend removal. Will refer to gynecological oncology for comanagment of patient. Patient is starting a new job in a few weeks. 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 She has some interest in delaying surgery until school is out since this is a time when she will be able to work and obtain some income. Discussed that if this is malignant my preference would be to operate sooner to help prevent advancement of disease and stage. She can also obtain the opinion of the gynecological oncologist on this matter.     More than 20 minutes were spent face to face with the patient in the room, reviewing the medical record, labs and images, and coordinating care for the patient. The plan of management was discussed in detail and counseling was provided.    Adrian Prows MD [facility] 11/06/2020 3:40 PM

## 2020-11-07 ENCOUNTER — Ambulatory Visit (INDEPENDENT_AMBULATORY_CARE_PROVIDER_SITE_OTHER): Payer: Medicare PPO | Admitting: Nurse Practitioner

## 2020-11-07 ENCOUNTER — Telehealth: Payer: Self-pay

## 2020-11-07 ENCOUNTER — Encounter: Payer: Self-pay | Admitting: Nurse Practitioner

## 2020-11-07 VITALS — BP 127/83 | HR 65 | Temp 97.8°F | Wt 228.6 lb

## 2020-11-07 DIAGNOSIS — E559 Vitamin D deficiency, unspecified: Secondary | ICD-10-CM

## 2020-11-07 DIAGNOSIS — N83201 Unspecified ovarian cyst, right side: Secondary | ICD-10-CM

## 2020-11-07 DIAGNOSIS — E1159 Type 2 diabetes mellitus with other circulatory complications: Secondary | ICD-10-CM | POA: Diagnosis not present

## 2020-11-07 DIAGNOSIS — Z853 Personal history of malignant neoplasm of breast: Secondary | ICD-10-CM

## 2020-11-07 DIAGNOSIS — E1169 Type 2 diabetes mellitus with other specified complication: Secondary | ICD-10-CM

## 2020-11-07 DIAGNOSIS — E538 Deficiency of other specified B group vitamins: Secondary | ICD-10-CM

## 2020-11-07 DIAGNOSIS — I440 Atrioventricular block, first degree: Secondary | ICD-10-CM

## 2020-11-07 DIAGNOSIS — Z87442 Personal history of urinary calculi: Secondary | ICD-10-CM | POA: Insufficient documentation

## 2020-11-07 DIAGNOSIS — E785 Hyperlipidemia, unspecified: Secondary | ICD-10-CM

## 2020-11-07 DIAGNOSIS — G620 Drug-induced polyneuropathy: Secondary | ICD-10-CM | POA: Diagnosis not present

## 2020-11-07 DIAGNOSIS — T451X5A Adverse effect of antineoplastic and immunosuppressive drugs, initial encounter: Secondary | ICD-10-CM

## 2020-11-07 DIAGNOSIS — Z0000 Encounter for general adult medical examination without abnormal findings: Secondary | ICD-10-CM

## 2020-11-07 DIAGNOSIS — I152 Hypertension secondary to endocrine disorders: Secondary | ICD-10-CM

## 2020-11-07 DIAGNOSIS — Z950 Presence of cardiac pacemaker: Secondary | ICD-10-CM

## 2020-11-07 LAB — MICROALBUMIN, URINE WAIVED
Creatinine, Urine Waived: 100 mg/dL (ref 10–300)
Microalb, Ur Waived: 10 mg/L (ref 0–19)
Microalb/Creat Ratio: <30 mg/g (ref <30)

## 2020-11-07 LAB — BAYER DCA HB A1C WAIVED: HB A1C (BAYER DCA - WAIVED): 6 % (ref <7.0)

## 2020-11-07 MED ORDER — AMILORIDE HCL 5 MG PO TABS
5.0000 mg | ORAL_TABLET | Freq: Every day | ORAL | 4 refills | Status: DC
Start: 2020-11-07 — End: 2021-11-13

## 2020-11-07 MED ORDER — METFORMIN HCL 500 MG PO TABS: 500.0000 mg | ORAL_TABLET | Freq: Two times a day (BID) | ORAL | 4 refills | Status: DC

## 2020-11-07 MED ORDER — ATENOLOL 50 MG PO TABS: 50.0000 mg | ORAL_TABLET | Freq: Every day | ORAL | 4 refills | Status: DC

## 2020-11-07 MED ORDER — PREGABALIN 50 MG PO CAPS: 50.0000 mg | ORAL_CAPSULE | Freq: Three times a day (TID) | ORAL | 4 refills | Status: DC

## 2020-11-07 MED ORDER — ATORVASTATIN CALCIUM 40 MG PO TABS: 40.0000 mg | ORAL_TABLET | Freq: Every day | ORAL | 4 refills | Status: DC

## 2020-11-07 MED ORDER — MONTELUKAST SODIUM 10 MG PO TABS: 10.0000 mg | ORAL_TABLET | Freq: Every day | ORAL | 4 refills | Status: DC

## 2020-11-07 MED ORDER — FLUTICASONE PROPIONATE 50 MCG/ACT NA SUSP: 2.0000 | Freq: Every day | NASAL | 12 refills | Status: DC

## 2020-11-07 NOTE — Progress Notes (Signed)
BP 127/83 (BP Location: Right Wrist, Cuff Size: Normal)   Pulse 65   Temp 97.8 F (36.6 C) (Oral)   Wt 228 lb 9.6 oz (103.7 kg)   SpO2 100%   BMI 39.86 kg/m    Subjective:    Patient ID: [name], female    DOB: 1951/06/27, 70 y.o.   MRN: [id number]  HPI: [name] is a 70 y.o. female presenting on 11/07/2020 for comprehensive medical examination. Current medical complaints include:none  [name] currently lives with: self Menopausal Symptoms: no   DIABETES Continues on Metformin 500 MG BID.  Last A1C in December 5.7%.   Hypoglycemic episodes:no Polydipsia/polyuria: no Visual disturbance: no Chest pain: no Paresthesias: no Glucose Monitoring: yes  Accucheck frequency: Daily  Fasting glucose: on average 100-110, this morning 107  Post prandial:  Evening:  Before meals: Taking Insulin?: no  Long acting insulin:  Short acting insulin: Blood Pressure Monitoring: daily Retinal Examination: Up to Date Foot Exam: Up to Date Pneumovax: Up to Date Influenza: Up to Date Aspirin: yes   HYPERTENSION / HYPERLIPIDEMIA Has pacemaker, PPM [employer] -- 2 1/2 to 3 years on pacemaker.  Followed by cardiology and last seen 10/24/20.   Satisfied with current treatment? yes Duration of hypertension: chronic BP monitoring frequency: daily BP range: 110/70 range at home BP medication side effects: no Duration of hyperlipidemia: chronic Cholesterol medication side effects: no Cholesterol supplements: none Medication compliance: good compliance Aspirin: no Recent stressors: no Recurrent headaches: no Visual changes: no Palpitations: no Dyspnea: no Chest pain: no Lower extremity edema: no Dizzy/lightheaded: no   HISTORY OF BREAST CA: Has ongoing neuropathy and hypomagnesemia, was followed by oncology.  Received magnesium infusions, at this time is transitioned to PCP monitoring. Takes Lyrica for neuropathy pain.  Currently is being followed by GYN for a cyst to right ovary,  they have recommended removal of this due to [name] history of breast cancer -- they are referring to GYN/ONC due to one marker on labs being off.  Depression Screen done today and results listed below:  Depression screen [facility] 2/9 11/07/2020 06/23/2020 09/27/2019 03/29/2019 03/16/2018  Decreased Interest 0 0 0 0 0  Down, Depressed, Hopeless 0 0 0 0 0  PHQ - 2 Score 0 0 0 0 0  Altered sleeping 0 0 - - 1  Tired, decreased energy 0 0 - - 1  Change in appetite 0 0 - - 0  Feeling bad or failure about yourself  0 0 - - 0  Trouble concentrating 0 0 - - 0  Moving slowly or fidgety/restless 0 0 - - 0  Suicidal thoughts 0 0 - - 0  PHQ-9 Score 0 0 - - 2  Difficult doing work/chores - Not difficult at all - - -  Some recent data might be hidden    The patient does not have a history of falls. I did not complete a risk assessment for falls. A plan of care for falls was not documented.   Past Medical History:  Past Medical History:  Diagnosis Date  . Breast CA ([place])    Chemo/XRT/lumpectomy  . Breast cancer ([place]) 09/2007   left breast, radiation, chemo  . Cardiomyopathy secondary   . Hypersomnolence 09/21/2013  . Leg edema, left 09/21/2013  . 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 Ovarian cyst 11/06/2020  . Pacemaker [employer]   . Sinus tachycardia   . Type 2 diabetes mellitus ([place]) 06/19/2015  Surgical History:  Past Surgical History:  Procedure Laterality Date  . 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 BREAST LUMPECTOMY    . CESAREAN SECTION  1983  . CHOLECYSTECTOMY    . EYE SURGERY Left 12/2018   cataracts-Dr.Shah in [place]   . EYE SURGERY Right 01/2019   cataracts- Dr.Shah in [place]  . [place]  . INSERT / REPLACE / REMOVE PACEMAKER  2011   revision @ [place]  05/13/2003   [employer] [facility] Comp - DR    Medications:  Current  Outpatient Medications on File Prior to Visit  Medication Sig  . azelastine (ASTELIN) 0.1 % nasal spray Place into both nostrils 2 (two) times daily. Use in each nostril as directed  . chlorpheniramine-HYDROcodone (TUSSIONEX PENNKINETIC ER) 10-8 MG/5ML SUER Take 5 mLs by mouth every 12 (twelve) hours as needed for cough.  Marland Kitchen CINNAMON PO Take by mouth daily.  Marland Kitchen esomeprazole (NEXIUM) 20 MG capsule Take 20 mg by mouth daily.  . fluticasone (FLOVENT HFA) 110 MCG/ACT inhaler Inhale into the lungs 2 (two) times daily.  Marland Kitchen glucose blood (ACCU-CHEK AVIVA PLUS) test strip TEST BLOOD SUGAR ONCE DAILY  . levocetirizine (XYZAL) 5 MG tablet Take 5 mg by mouth every evening.  . magnesium oxide (MAG-OX) 400 MG tablet Take 400 mg by mouth 2 (two) times daily.  . melatonin 3 MG TABS tablet Take 3 mg by mouth at bedtime.  . meloxicam (MOBIC) 15 MG tablet Take 1 tablet by mouth daily.  Marland Kitchen nystatin (MYCOSTATIN/NYSTOP) powder Apply 1 application topically 2 (two) times daily.  . SUMAtriptan (IMITREX) 100 MG tablet Take 1 tablet (100 mg total) by mouth as needed.   No current facility-administered medications on file prior to visit.    Allergies:  Allergies  Allergen Reactions  . Penicillins Swelling    Causes organs to swell Other reaction(s): Other (See Comments) Organs flipped over per patient; skin starting splitting open and blood vessels burst in legs;  Causes organs to swell  . Levofloxacin Swelling    Leg swelling and muscle weakness Other reaction(s): Other (See Comments) Tendon pain and weakness in muscles Leg swelling and muscle weakness    Social History:  Social History   Socioeconomic History  . Marital status: Divorced    Spouse name: Not on file  . Number of children: Not on file  . Years of education: Not on file  . Highest education level: Bachelor's degree (e.g., BA, AB, BS)  Occupational History  . Occupation: Full time    Employer: [employer]  Tobacco Use  .  Smoking status: Former Smoker    Quit date: 08/26/1981    Years since quitting: 39.2  . Smokeless tobacco: Never Used  Vaping Use  . Vaping Use: Never used  Substance and Sexual Activity  . Alcohol use: Not Currently  . Drug use: No  . Sexual activity: Not on file  Other Topics Concern  . Not on file  Social History Narrative   Divorced   Gets regular exercise   Social Determinants of Health   Financial Resource Strain: Low Risk   . Difficulty of Paying Living Expenses: Not hard at all  Food Insecurity: No Food Insecurity  . Worried About Charity fundraiser in the Last Year: Never true  . Ran Out of Food in the Last Year: Never true  Transportation  Needs: No Transportation Needs  . Lack of Transportation (Medical): No  . Lack of Transportation (Non-Medical): No  Physical Activity: Inactive  . Days of Exercise per Week: 0 days  . Minutes of Exercise per Session: 0 min  Stress: No Stress Concern Present  . Feeling of Stress : Not at all  Social Connections: Not on file  Intimate Partner Violence: Not on file   Social History   Tobacco Use  Smoking Status Former Smoker  . Quit date: 08/26/1981  . Years since quitting: 39.2  Smokeless Tobacco Never Used   Social History   Substance and Sexual Activity  Alcohol Use Not Currently    Family History:  Family History  Problem Relation Age of Onset  . Diabetes Mother   . Heart disease Mother   . Thyroid disease Mother   . Hypertension Father   . Cancer Father   . Alcohol abuse Father   . Diabetes Father   . Stroke Other        Family hx of CVA or stroke  . Diabetes Brother   . Diabetes Daughter   . Diabetes Paternal Uncle   . Diabetes Paternal Grandmother   . Breast cancer Maternal Aunt     Past medical history, surgical history, medications, allergies, family history and social history reviewed with patient today and changes made to appropriate areas of the chart.   Review of Systems - negative All other ROS  negative except what is listed above and in the HPI.      Objective:    BP 127/83 (BP Location: Right Wrist, Cuff Size: Normal)   Pulse 65   Temp 97.8 F (36.6 C) (Oral)   Wt 228 lb 9.6 oz (103.7 kg)   SpO2 100%   BMI 39.86 kg/m   Wt Readings from Last 3 Encounters:  11/07/20 228 lb 9.6 oz (103.7 kg)  11/06/20 231 lb 9.6 oz (105.1 kg)  10/27/20 229 lb 12.8 oz (104.2 kg)    Physical Exam Constitutional:      General: [name] is awake. [name] is not in acute distress.    Appearance: [name] is well-developed. [name] is not ill-appearing.  HENT:     Head: Normocephalic and atraumatic.     Right Ear: Hearing, tympanic membrane, ear canal and external ear normal. No drainage.     Left Ear: Hearing, tympanic membrane, ear canal and external ear normal. No drainage.     Nose: Nose normal.     Right Sinus: No maxillary sinus tenderness or frontal sinus tenderness.     Left Sinus: No maxillary sinus tenderness or frontal sinus tenderness.     Mouth/Throat:     Mouth: Mucous membranes are moist.     Pharynx: Oropharynx is clear. Uvula midline. No pharyngeal swelling, oropharyngeal exudate or posterior oropharyngeal erythema.  Eyes:     General: Lids are normal.        Right eye: No discharge.        Left eye: No discharge.     Extraocular Movements: Extraocular movements intact.     Conjunctiva/sclera: Conjunctivae normal.     Pupils: Pupils are equal, round, and reactive to light.     Visual Fields: Right eye visual fields normal and left eye visual fields normal.  Neck:     Thyroid: No thyromegaly.     Vascular: No carotid bruit.     Trachea: Trachea normal.  Cardiovascular:     Rate and Rhythm: Normal rate and regular rhythm.  Heart sounds: Normal heart sounds. No murmur heard. No gallop.   Pulmonary:     Effort: Pulmonary effort is normal. No accessory muscle usage or respiratory distress.     Breath sounds: Normal breath sounds.  Chest:     Comments: Deferred per patient request  due to recent exam with oncology. Abdominal:     General: Bowel sounds are normal.     Palpations: Abdomen is soft. There is no hepatomegaly or splenomegaly.     Tenderness: There is no abdominal tenderness.  Musculoskeletal:        General: Normal range of motion.     Cervical back: Normal range of motion and neck supple.     Right lower leg: No edema.     Left lower leg: No edema.  Lymphadenopathy:     Head:     Right side of head: No submental, submandibular, tonsillar, preauricular or posterior auricular adenopathy.     Left side of head: No submental, submandibular, tonsillar, preauricular or posterior auricular adenopathy.     Cervical: No cervical adenopathy.  Skin:    General: Skin is warm and dry.     Capillary Refill: Capillary refill takes less than 2 seconds.     Findings: No rash.  Neurological:     Mental Status: [name] is alert and oriented to person, place, and time.     Cranial Nerves: Cranial nerves are intact.     Gait: Gait is intact.     Deep Tendon Reflexes: Reflexes are normal and symmetric.     Reflex Scores:      Brachioradialis reflexes are 2+ on the right side and 2+ on the left side.      Patellar reflexes are 2+ on the right side and 2+ on the left side. Psychiatric:        Attention and Perception: Attention normal.        Mood and Affect: Mood normal.        Speech: Speech normal.        Behavior: Behavior normal. Behavior is cooperative.        Thought Content: Thought content normal.        Judgment: Judgment normal.    Results for orders placed or performed in visit on 10/27/20  Ovarian Malignancy Risk-ROMA  Result Value Ref Range   Cancer Antigen (CA) 125 27.9 0.0 - 38.1 U/mL   HE4 71.9 0.0 - 96.9 pmol/L   Premenopausal ROMA 1.66 (H) See below   Postmenopausal ROMA 2.30 See below   Comment Comment   AFP tumor marker  Result Value Ref Range   AFP, Serum, Tumor Marker 5.0 0.0 - 8.3 ng/mL  Beta hCG quant (ref lab)  Result Value Ref Range    hCG Quant 3 mIU/mL  Cancer Antigen 19-9  Result Value Ref Range   CA 19-9 72 (H) 0 - 35 U/mL  Premenopausal Interp: HIGH  Result Value Ref Range   Premenopausal Interp: HIGH Comment   Postmenopausal Interp: LOW  Result Value Ref Range   Postmenopausal Interp: LOW Comment       Assessment & Plan:   Problem List Items Addressed This Visit      Cardiovascular and Mediastinum   AV BLOCK, 1ST DEGREE    Followed by cardiology, continue collaboration and review notes.      Relevant Medications   aMILoride (MIDAMOR) 5 MG tablet   atenolol (TENORMIN) 50 MG tablet   atorvastatin (LIPITOR) 40 MG tablet   Hypertension associated  with diabetes ([place])    Chronic, ongoing with BP well below goal.  Continue current medication regimen and adjust as needed + continue collaboration with cardiology.  CMP and TSH today.  Recommend [name] continue to monitor BP at home and focus on DASH diet.  Return in 3 months.      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 Urine micro ALB 10 and A:C <30. Continue current medication regimen as A1C at goal and no hypoglycemia at home. If continued downward trend may consider reduction of medication.  Continue to monitor BS at home daily.   Return in 3 months for visit.      Relevant Medications   atorvastatin (LIPITOR) 40 MG tablet   metFORMIN (GLUCOPHAGE) 500 MG tablet   Other Relevant Orders   Bayer DCA Hb A1c Waived   Microalbumin, Urine Waived   Hyperlipidemia associated with type 2 diabetes mellitus (HCC)    Chronic, ongoing.  Continue current medication regimen and adjust as needed.  Lipid panel today.      Relevant  Medications   atorvastatin (LIPITOR) 40 MG tablet   metFORMIN (GLUCOPHAGE) 500 MG tablet   Other Relevant Orders   Comprehensive metabolic panel   Lipid Panel w/o Chol/HDL Ratio   Bayer DCA Hb A1c Waived   Cyst of right ovary    Will continue collaboration with GYN and oncology, recent notes reviewed and discussed with [name] today at visit.        Nervous and Auditory   Chemotherapy-induced neuropathy (HCC)    Chronic, ongoing.  Continue Lyrica and adjust as needed, especially with aging monitor dose closely.  Return in 3 months.      Relevant Medications   pregabalin (LYRICA) 50 MG capsule     Other   PPM-[employer]    Continue collaboration with cardiology for checks.      Hypomagnesemia    Chronically low.  Continue collaboration with nephrology and Amiloride as ordered by them and daily Mag.  Mag level today.      Relevant Orders   Magnesium   History of breast cancer    Continue collaboration with oncology.      B12 deficiency    Chronic, ongoing, did not tolerate oral supplement.  Recheck B12 today and if low levels consider monthly injections.      Relevant Orders   CBC with Differential/Platelet   Vitamin B12   Morbid obesity (HCC)    BMI 39.86 with T2DM and HTN.  Recommended eating smaller high protein, low fat meals more frequently and exercising 30 mins a day 5 times a week with a goal of 10-15lb weight loss in the next 3 months. Patient voiced their understanding and motivation to adhere to these recommendations.       Relevant Medications   metFORMIN (GLUCOPHAGE) 500 MG tablet    Other Visit Diagnoses    Vitamin D deficiency       History of low levels, check today and start supplement as needed.   Relevant Orders   VITAMIN D 25 Hydroxy (Vit-D Deficiency, Fractures)   Annual physical exam  Annual labs obtained today and health maintenance reviewed.       Follow up plan: Return in about 3 months (around 02/07/2021) for T2DM,  HTN/HLD.   LABORATORY TESTING:  - Pap smear: not applicable == recent pelvic  IMMUNIZATIONS:   - Tdap: Tetanus vaccination status reviewed: last tetanus booster within 10 years. - Influenza: Up to date - Pneumovax: Up to date - Prevnar: Up To Date - HPV: Not applicable - Zostavax vaccine: up to date x 1 in October 2021  SCREENING: -Mammogram: Up to date 01/14/20 - Colonoscopy: Ordered today -- Cologuard up to date - Bone Density: Up to date done in 2015 and normal -Hearing Test: Not applicable  -Spirometry: Not applicable   PATIENT COUNSELING:   Advised to take 1 mg of folate supplement per day if capable of pregnancy.   Sexuality: Discussed sexually transmitted diseases, partner selection, use of condoms, avoidance of unintended pregnancy  and contraceptive alternatives.   Advised to avoid cigarette smoking.  I discussed with the patient that most people either abstain from alcohol or drink within safe limits (<=14/week and <=4 drinks/occasion for males, <=7/weeks and <= 3 drinks/occasion for females) and that the risk for alcohol disorders and other health effects rises proportionally with the number of drinks per week and how often a drinker exceeds daily limits.  Discussed cessation/primary prevention of drug use and availability of treatment for abuse.   Diet: Encouraged to adjust caloric intake to maintain  or achieve ideal body weight, to reduce intake of dietary saturated fat and total fat, to limit sodium intake by avoiding high sodium foods and not adding table salt, and to maintain adequate dietary potassium and calcium preferably from fresh fruits, vegetables, and low-fat dairy products.    Stressed the importance of regular exercise  Injury prevention: Discussed safety belts, safety helmets, smoke detector, smoking near bedding or upholstery.   Dental health: Discussed importance of regular tooth brushing, flossing, and dental visits.    NEXT PREVENTATIVE PHYSICAL  DUE IN 1 YEAR. Return in about 3 months (around 02/07/2021) for T2DM, HTN/HLD.

## 2020-11-07 NOTE — Assessment & Plan Note (Signed)
Chronic, ongoing with A1C today 6%. Urine micro ALB 10 and A:C <30. Continue current medication regimen as A1C at goal and no hypoglycemia at home. If continued downward trend may consider reduction of medication.  Continue to monitor BS at home daily.   Return in 3 months for visit.

## 2020-11-07 NOTE — Assessment & Plan Note (Signed)
BMI 39.86 with T2DM and HTN.  Recommended eating smaller high protein, low fat meals more frequently and exercising 30 mins a day 5 times a week with a goal of 10-15lb weight loss in the next 3 months. Patient voiced their understanding and motivation to adhere to these recommendations.

## 2020-11-07 NOTE — Assessment & Plan Note (Signed)
Will continue collaboration with GYN and oncology, recent notes reviewed and discussed with her today at visit.

## 2020-11-07 NOTE — Assessment & Plan Note (Signed)
Followed by cardiology, continue collaboration and review notes.

## 2020-11-07 NOTE — Assessment & Plan Note (Signed)
Chronic, ongoing.  Continue Lyrica and adjust as needed, especially with aging monitor dose closely.  Return in 3 months.

## 2020-11-07 NOTE — Assessment & Plan Note (Signed)
Continue collaboration with oncology. ?

## 2020-11-07 NOTE — Assessment & Plan Note (Signed)
Chronic, ongoing.  Continue current medication regimen and adjust as needed. Lipid panel today. 

## 2020-11-07 NOTE — Patient Instructions (Signed)
[facility]  Address: [address] [place], [place], [place] [address]  Phone: [phone]  -- mammogram due 01/14/20   Mammogram A mammogram is an X-ray of the breasts. This is done to check for changes that are not normal. This test can look for changes that may be caused by breast cancer or other problems. Mammograms are regularly done on women beginning at age 71. A man may have a mammogram if he has a lump or swelling in his breast. Tell a doctor:  About any allergies you have.  If you have breast implants.  If you have had breast disease, biopsy, or surgery.  If you have a family history of breast cancer.  If you are breastfeeding.  Whether you are pregnant or may be pregnant. What are the risks? Generally, this is a safe procedure. But problems may occur, including:  Being exposed to radiation. Radiation levels are very low with this test.  The need for more tests.  The results were not read properly.  Trouble finding breast cancer in women with dense breasts. What happens before the test?  Have this test done about 1-2 weeks after your menstrual period. This is often when your breasts are the least tender.  If you are visiting a new doctor or clinic, have any past mammogram images sent to your new doctor's office.  Wash your breasts and under your arms on the day of the test.  Do not use deodorants, perfumes, lotions, or powders on the day of the test.  Take off any jewelry from your neck.  Wear clothes that you can change into and out of easily. What happens during the test?  You will take off your clothes from the waist up. You will put on a gown.  You will stand in front of the X-ray machine.  Each breast will be placed between two plastic or glass plates. The plates will press down on your breast for a few seconds. Try to relax. This does not cause any harm to your breasts. It may not feel comfortable, but it will be very  brief.  X-rays will be taken from different angles of each breast. The procedure may vary among doctors and hospitals.   What can I expect after the test?  The mammogram will be read by a specialist (radiologist).  You may need to do parts of the test again. This depends on the quality of the images.  You may go back to your normal activities.  It is up to you to get the results of your test. Ask how to get your results when they are ready. Summary  A mammogram is an X-ray of the breasts. It looks for changes that may be caused by breast cancer or other problems.  A man may have this test if he has a lump or swelling in his breast.  Before the test, tell your doctor about any breast problems that you have had in the past.  Have this test done about 1-2 weeks after your menstrual period.  Ask when your test results will be ready. Make sure you get your test results. This information is not intended to replace advice given to you by your health care provider. Make sure you discuss any questions you have with your health care provider. Document Revised: 06/12/2020 Document Reviewed: 06/12/2020 Elsevier Patient Education  2021 [employer].

## 2020-11-07 NOTE — Assessment & Plan Note (Signed)
Chronic, ongoing, did not tolerate oral supplement.  Recheck B12 today and if low levels consider monthly injections.

## 2020-11-07 NOTE — Assessment & Plan Note (Signed)
Continue collaboration with cardiology for checks. 

## 2020-11-07 NOTE — Assessment & Plan Note (Signed)
Chronic, ongoing with BP well below goal.  Continue current medication regimen and adjust as needed + continue collaboration with cardiology.  CMP and TSH today.  Recommend she continue to monitor BP at home and focus on DASH diet.  Return in 3 months.

## 2020-11-07 NOTE — Assessment & Plan Note (Signed)
Chronically low.  Continue collaboration with nephrology and Amiloride as ordered by them and daily Mag.  Mag level today.

## 2020-11-07 NOTE — Telephone Encounter (Signed)
Received referral from Dr. Gilman Schmidt for ovarian cyst. Records reviewed. Contacted [name] and offered appointment 11/22/20. She requested to see [name]. Appointment arranged for 12/06/20. Made her aware if she desires to be seen sooner by a different provider to call back and let us know. She verbalized understanding.

## 2020-11-08 ENCOUNTER — Other Ambulatory Visit: Payer: Self-pay | Admitting: Nurse Practitioner

## 2020-11-08 DIAGNOSIS — E039 Hypothyroidism, unspecified: Secondary | ICD-10-CM

## 2020-11-08 LAB — LIPID PANEL W/O CHOL/HDL RATIO
Cholesterol, Total: 151 mg/dL (ref 100–199)
HDL: 51 mg/dL (ref >39)
LDL Chol Calc (NIH): 74 mg/dL (ref 0–99)
Triglycerides: 148 mg/dL (ref 0–149)
VLDL Cholesterol Cal: 26 mg/dL (ref 5–40)

## 2020-11-08 LAB — MAGNESIUM: Magnesium: 1.5 mg/dL — ABNORMAL LOW (ref 1.6–2.3)

## 2020-11-08 LAB — COMPREHENSIVE METABOLIC PANEL
ALT: 18 IU/L (ref 0–32)
AST: 18 IU/L (ref 0–40)
Albumin/Globulin Ratio: 2.2 (ref 1.2–2.2)
Albumin: 4.6 g/dL (ref 3.8–4.8)
Alkaline Phosphatase: 107 IU/L (ref 44–121)
BUN/Creatinine Ratio: 22 (ref 12–28)
BUN: 24 mg/dL (ref 8–27)
Bilirubin Total: 0.5 mg/dL (ref 0.0–1.2)
CO2: 23 mmol/L (ref 20–29)
Calcium: 9.9 mg/dL (ref 8.7–10.3)
Chloride: 101 mmol/L (ref 96–106)
Creatinine, Ser: 1.08 mg/dL — ABNORMAL HIGH (ref 0.57–1.00)
Globulin, Total: 2.1 g/dL (ref 1.5–4.5)
Glucose: 109 mg/dL — ABNORMAL HIGH (ref 65–99)
Potassium: 5 mmol/L (ref 3.5–5.2)
Sodium: 143 mmol/L (ref 134–144)
Total Protein: 6.7 g/dL (ref 6.0–8.5)
eGFR: 55 mL/min/{1.73_m2} — ABNORMAL LOW (ref >59)

## 2020-11-08 LAB — CBC WITH DIFFERENTIAL/PLATELET
Basophils Absolute: 0.1 10*3/uL (ref 0.0–0.2)
Basos: 1 %
EOS (ABSOLUTE): 0.3 10*3/uL (ref 0.0–0.4)
Eos: 5 %
Hematocrit: 37.2 % (ref 34.0–46.6)
Hemoglobin: 12.5 g/dL (ref 11.1–15.9)
Immature Grans (Abs): 0 10*3/uL (ref 0.0–0.1)
Immature Granulocytes: 1 %
Lymphocytes Absolute: 1.9 10*3/uL (ref 0.7–3.1)
Lymphs: 29 %
MCH: 29.8 pg (ref 26.6–33.0)
MCHC: 33.6 g/dL (ref 31.5–35.7)
MCV: 89 fL (ref 79–97)
Monocytes Absolute: 0.6 10*3/uL (ref 0.1–0.9)
Monocytes: 9 %
Neutrophils Absolute: 3.6 10*3/uL (ref 1.4–7.0)
Neutrophils: 55 %
Platelets: 196 10*3/uL (ref 150–450)
RBC: 4.2 x10E6/uL (ref 3.77–5.28)
RDW: 13 % (ref 11.7–15.4)
WBC: 6.5 10*3/uL (ref 3.4–10.8)

## 2020-11-08 LAB — VITAMIN B12: Vitamin B-12: 345 pg/mL (ref 232–1245)

## 2020-11-08 LAB — TSH: TSH: 5.71 u[IU]/mL — ABNORMAL HIGH (ref 0.450–4.500)

## 2020-11-08 LAB — VITAMIN D 25 HYDROXY (VIT D DEFICIENCY, FRACTURES): Vit D, 25-Hydroxy: 19.9 ng/mL — ABNORMAL LOW (ref 30.0–100.0)

## 2020-11-08 NOTE — Progress Notes (Signed)
Contacted via MyChart   Good afternoon [name], your labs have returned.  CBC shows no anemia.  Kidney function (creatinine and eGFR) continue to show some mild kidney disease, but no decline in this.  Liver function is normal (AST/ALT).  Cholesterol levels are at goal.  Magnesium remains 1.5, so stable, continue medications and we will monitor.  B12 remains on low side, we will continue to monitor and you could trial taking 500 MCG of B12 every other day.  Vitamin D is low, I do recommend taking Vitamin D3 2000 units daily for overall bone health.  Thyroid level was elevated this check, I would like you to schedule outpatient lab only to recheck this in office in 6 weeks.  Any questions? Keep being awesome!!  Thank you for allowing me to participate in your care. Kindest regards, Jazmen Lindenbaum

## 2020-11-20 ENCOUNTER — Other Ambulatory Visit: Payer: Self-pay | Admitting: Nurse Practitioner

## 2020-11-20 DIAGNOSIS — Z1231 Encounter for screening mammogram for malignant neoplasm of breast: Secondary | ICD-10-CM

## 2020-12-06 ENCOUNTER — Other Ambulatory Visit: Payer: Self-pay

## 2020-12-06 ENCOUNTER — Inpatient Hospital Stay: Payer: Medicare PPO | Attending: Obstetrics and Gynecology | Admitting: Obstetrics and Gynecology

## 2020-12-06 VITALS — BP 150/68 | HR 76 | Resp 18 | Wt 229.0 lb

## 2020-12-06 DIAGNOSIS — Z87891 Personal history of nicotine dependence: Secondary | ICD-10-CM | POA: Insufficient documentation

## 2020-12-06 DIAGNOSIS — N134 Hydroureter: Secondary | ICD-10-CM | POA: Insufficient documentation

## 2020-12-06 DIAGNOSIS — C50912 Malignant neoplasm of unspecified site of left female breast: Secondary | ICD-10-CM

## 2020-12-06 DIAGNOSIS — Z6839 Body mass index (BMI) 39.0-39.9, adult: Secondary | ICD-10-CM | POA: Insufficient documentation

## 2020-12-06 DIAGNOSIS — E1159 Type 2 diabetes mellitus with other circulatory complications: Secondary | ICD-10-CM | POA: Insufficient documentation

## 2020-12-06 DIAGNOSIS — N132 Hydronephrosis with renal and ureteral calculous obstruction: Secondary | ICD-10-CM | POA: Insufficient documentation

## 2020-12-06 DIAGNOSIS — I700 Atherosclerosis of aorta: Secondary | ICD-10-CM | POA: Insufficient documentation

## 2020-12-06 DIAGNOSIS — Z853 Personal history of malignant neoplasm of breast: Secondary | ICD-10-CM | POA: Insufficient documentation

## 2020-12-06 DIAGNOSIS — D270 Benign neoplasm of right ovary: Secondary | ICD-10-CM | POA: Diagnosis not present

## 2020-12-06 DIAGNOSIS — R978 Other abnormal tumor markers: Secondary | ICD-10-CM | POA: Diagnosis not present

## 2020-12-06 DIAGNOSIS — M5126 Other intervertebral disc displacement, lumbar region: Secondary | ICD-10-CM | POA: Insufficient documentation

## 2020-12-06 DIAGNOSIS — I429 Cardiomyopathy, unspecified: Secondary | ICD-10-CM | POA: Insufficient documentation

## 2020-12-06 DIAGNOSIS — E785 Hyperlipidemia, unspecified: Secondary | ICD-10-CM | POA: Insufficient documentation

## 2020-12-06 DIAGNOSIS — M48061 Spinal stenosis, lumbar region without neurogenic claudication: Secondary | ICD-10-CM | POA: Insufficient documentation

## 2020-12-06 DIAGNOSIS — Z7984 Long term (current) use of oral hypoglycemic drugs: Secondary | ICD-10-CM | POA: Insufficient documentation

## 2020-12-06 DIAGNOSIS — R000 Tachycardia, unspecified: Secondary | ICD-10-CM | POA: Insufficient documentation

## 2020-12-06 DIAGNOSIS — Z950 Presence of cardiac pacemaker: Secondary | ICD-10-CM | POA: Insufficient documentation

## 2020-12-06 DIAGNOSIS — Z79899 Other long term (current) drug therapy: Secondary | ICD-10-CM | POA: Insufficient documentation

## 2020-12-06 DIAGNOSIS — E1136 Type 2 diabetes mellitus with diabetic cataract: Secondary | ICD-10-CM | POA: Insufficient documentation

## 2020-12-06 NOTE — Progress Notes (Signed)
Gynecologic Oncology Consult Visit   Referring Provider: Dr. Gardiner Rhyme  Chief Concern: right ovarian cyst  Subjective:  [name] is a 70 y.o. G2P2 female with h/o breast cancer who is seen in consultation from Dr. Gardiner Rhyme for right ovarian cyst.   She presents for evaluation of a incidental finding of a right ovarian dermoid diagnosed on renal CT. She is asymptomatic with regard to the ovarian cyst.   09/22/2020 CT Renal  Compared to scan from Jan 04, 2008 IMPRESSION: 1. 6 x 5 mm calculus at the left ureterovesical junction with moderate hydronephrosis on the left. There is left perinephric soft tissue stranding and fluid. There is also moderate ureterectasis on the left. 2.  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 3. No evident bowel obstruction. No abscess in the abdomen or pelvis. Appendix appears normal. 4. Spinal stenosis at L4-5 due to disc protrusion and bony hypertrophy. 5.  Aortic Atherosclerosis (ICD10-I70.0). 6.  Absent gallbladder.   10/11/2020 Pelvic US FINDINGS: Uterus: Measurements: 5.6 x 2.5 x 3.8 cm = volume: 27 mL. Atrophic. Neutral position. Normal morphology without mass  Endometrium Thickness: 5 mm.  No endometrial fluid or focal abnormalities  Right ovary No normal appearing RIGHT ovary visualized, see below  Left ovary Not visualized, likely obscured by bowel  Other findings No free pelvic fluid. Hyperechoic mass with shadowing identified in RIGHT adnexa 4.4 x 3.1 x 3.2 cm consistent with a dermoid tumor; this measured 3.2 x 3.0 x 3.9 cm on recent CT. No calcifications evident.  IMPRESSION: Normal appearing uterus and endometrial complex. Nonvisualization of LEFT ovary. 4.4 cm diameter dermoid tumor in RIGHT adnexa, corresponding to CT finding, likely arising from RIGHT ovary.  10/27/2020 Tumor markers  Cancer Antigen (CA) 125 0.0 - 38.1 U/mL 27.9   Comment: Roche [employer] Electrochemiluminescence Immunoassay (ECLIA)  Values  obtained with different assay methods or kits cannot be  used interchangeably. Results cannot be interpreted as absolute  evidence of the presence or absence of malignant disease.   HE4 0.0 - 96.9 pmol/L 71.9    Postmenopausal ROMA 2.30 Comment: If the patient is postmenopausal, then the postmenopausal ROMA score  of less than 2.99 is consistent with a low likelihood of finding  a malignancy on surgery.   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 Grayland Ormond. She was last seen 06/2020 with a reassuring exam. 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 She is seen by cardiology.   Problem List: Patient Active Problem List   Diagnosis Date Noted  . Dermoid cyst of right ovary 12/06/2020  . History of kidney stones 11/07/2020  . Cyst of right ovary 10/04/2020  . Seasonal allergic rhinitis 08/11/2020  . Chronic pain of left ankle 04/27/2020  . Morbid obesity ([place]) 04/27/2020  . Hot flashes 12/28/2019  . B12 deficiency 09/28/2019  . Advanced care planning/counseling discussion 09/09/2017  . Hyperlipidemia associated with type 2 diabetes mellitus ([place]) 07/29/2016  . Chemotherapy-induced neuropathy ([place]) 04/01/2016  . History of breast cancer 10/02/2015  . Hypertension associated with diabetes ([place]) 06/19/2015  . Diabetes mellitus associated with hormonal etiology ([place]) 06/19/2015  . Hypomagnesemia 06/19/2015  . AV BLOCK, 1ST DEGREE 07/02/2010  . PPM-[employer] 01/13/2009    Past Medical History: Past Medical History:  Diagnosis Date  . Breast CA ([place])    Chemo/XRT/lumpectomy  . Breast cancer ([place]) 09/2007   left breast, radiation, chemo  . Cardiomyopathy secondary   . Hypersomnolence 09/21/2013  . Leg edema, left 09/21/2013  . 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 Ovarian cyst 11/06/2020  . Pacemaker [employer]   . Sinus tachycardia   . Type 2 diabetes mellitus ([place]) 06/19/2015    Past Surgical History: Past Surgical History:  Procedure Laterality Date  . 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 BREAST LUMPECTOMY    . CESAREAN SECTION  1983  . CHOLECYSTECTOMY    . EYE SURGERY Left 12/2018   cataracts-Dr.Shah in [place]   . EYE SURGERY Right 01/2019   cataracts- Dr.Shah in [place]  . [place]  . INSERT / REPLACE / REMOVE PACEMAKER  2011   revision @ [facility]  05/13/2003   [employer] [facility] - DR    Past Gynecologic History:  Per HPI  OB History:  OB History  Gravida Para Term Preterm AB Living  '2 1   1   2  ' SAB IAB Ectopic Multiple Live Births          2    # Outcome Date GA Lbr Len/2nd Weight Sex Delivery Anes PTL Lv  2 Preterm 06/28/86    M Vag-Spont   LIV  1 Gravida 04/19/82    F CS-Unspec   LIV    Family History: Family History  Problem Relation Age of Onset  . Diabetes Mother   . Heart disease Mother   . Thyroid disease Mother   . Hypertension Father   . Cancer Father   . Alcohol abuse Father   . Diabetes Father   . Stroke Other        Family hx of CVA or stroke  . Diabetes Brother   . Diabetes Daughter   . Diabetes Paternal Uncle   . Diabetes Paternal Grandmother   . Breast cancer Maternal Aunt     Social History: Social History   Socioeconomic History  . Marital status: Divorced    Spouse name: Not on file  . Number of children: Not on file  . Years of education: Not on file  . Highest education level: Bachelor's degree (e.g., BA, AB, BS)  Occupational History  . Occupation: Full time    Employer: [employer]  Tobacco Use  . Smoking status: Former Smoker    Quit date: 08/26/1981    Years since quitting: 39.3  . Smokeless  tobacco: Never Used  Vaping Use  . Vaping Use: Never used  Substance and Sexual Activity  . Alcohol use: Not Currently  . Drug use: No  . Sexual activity: Not on file  Other Topics Concern  . Not on file  Social History Narrative   Divorced   Gets regular exercise   Social Determinants of Health   Financial Resource Strain: Low Risk   . Difficulty of Paying Living Expenses: Not hard at all  Food Insecurity: No Food Insecurity  . Worried About Charity fundraiser in the Last Year: Never true  . Ran Out of Food in the Last Year: Never true  Transportation Needs: No Transportation Needs  . Lack of Transportation (Medical): No  . Lack of Transportation (Non-Medical): No  Physical Activity: Inactive  . Days  of Exercise per Week: 0 days  . Minutes of Exercise per Session: 0 min  Stress: No Stress Concern Present  . Feeling of Stress : Not at all  Social Connections: Not on file  Intimate Partner Violence: Not on file    Allergies: Allergies  Allergen Reactions  . Penicillins Swelling    Causes organs to swell Other reaction(s): Other (See Comments) Organs flipped over per patient; skin starting splitting open and blood vessels burst in legs;  Causes organs to swell  . Levofloxacin Swelling    Leg swelling and muscle weakness Other reaction(s): Other (See Comments) Tendon pain and weakness in muscles Leg swelling and muscle weakness    Current Medications: Current Outpatient Medications  Medication Sig Dispense Refill  . aMILoride (MIDAMOR) 5 MG tablet Take 1 tablet (5 mg total) by mouth daily. 90 tablet 4  . atenolol (TENORMIN) 50 MG tablet Take 1 tablet (50 mg total) by mouth daily. 90 tablet 4  . atorvastatin (LIPITOR) 40 MG tablet Take 1 tablet (40 mg total) by mouth daily. 90 tablet 4  . azelastine (ASTELIN) 0.1 % nasal spray Place into both nostrils 2 (two) times daily. Use in each nostril as directed    . CINNAMON PO Take by mouth daily.    Marland Kitchen esomeprazole  (NEXIUM) 20 MG capsule Take 20 mg by mouth daily.    . fluticasone (FLONASE) 50 MCG/ACT nasal spray Place 2 sprays into both nostrils daily. 16 g 12  . fluticasone (FLOVENT HFA) 110 MCG/ACT inhaler Inhale into the lungs 2 (two) times daily.    Marland Kitchen glucose blood (ACCU-CHEK AVIVA PLUS) test strip TEST BLOOD SUGAR ONCE DAILY 100 each 12  . levocetirizine (XYZAL) 5 MG tablet Take 5 mg by mouth every evening.    . magnesium oxide (MAG-OX) 400 MG tablet Take 400 mg by mouth 2 (two) times daily.    . melatonin 3 MG TABS tablet Take 3 mg by mouth at bedtime.    . meloxicam (MOBIC) 15 MG tablet Take 1 tablet by mouth daily.    . metFORMIN (GLUCOPHAGE) 500 MG tablet Take 1 tablet (500 mg total) by mouth 2 (two) times daily with a meal. 180 tablet 4  . montelukast (SINGULAIR) 10 MG tablet Take 1 tablet (10 mg total) by mouth at bedtime. 90 tablet 4  . nystatin (MYCOSTATIN/NYSTOP) powder Apply 1 application topically 2 (two) times daily. 15 g 4  . pregabalin (LYRICA) 50 MG capsule Take 1 capsule (50 mg total) by mouth 3 (three) times daily. 270 capsule 4  . chlorpheniramine-HYDROcodone (TUSSIONEX PENNKINETIC ER) 10-8 MG/5ML SUER Take 5 mLs by mouth every 12 (twelve) hours as needed for cough. (Patient not taking: Reported on 12/06/2020) 60 mL 0  . SUMAtriptan (IMITREX) 100 MG tablet Take 1 tablet (100 mg total) by mouth as needed. (Patient not taking: Reported on 12/06/2020) 10 tablet 12   No current facility-administered medications for this visit.    Review of Systems General: negative for fevers, changes in weight or night sweats Skin: negative for changes in moles or sores or rash Eyes: negative for changes in vision HEENT: negative for change in hearing, tinnitus, voice changes Pulmonary: negative for dyspnea, orthopnea, productive cough, wheezing Cardiac: negative for palpitations, pain Gastrointestinal: negative for nausea, vomiting, constipation, diarrhea, hematemesis,  hematochezia Genitourinary/Sexual: negative for dysuria, retention, hematuria, incontinence Ob/Gyn:  negative for abnormal bleeding, or pain Musculoskeletal: negative for pain, joint pain, back pain Hematology: negative for easy bruising, abnormal bleeding Neurologic/Psych: negative for  headaches, seizures, paralysis, weakness, numbness  Objective:  Physical Examination:  BP (!) 150/68   Pulse 76   Resp 18   Wt 229 lb (103.9 kg)   SpO2 100%   BMI 39.93 kg/m    ECOG Performance Status: 0 - Asymptomatic  General appearance: alert, cooperative and appears stated age HEENT:PERRLA, extra ocular movement intact, sclera clear, anicteric and neck supple with midline trachea Lymph node survey: negative axillary, inguinal, supraclavicular nodes Cardiovascular: regular rate and rhythm Respiratory: normal air entry, lungs clear to auscultation Abdomen: soft, non-tender, without masses or organomegaly, nondistended, no hernias and well healed incision Extremities: extremities normal, atraumatic, no cyanosis or edema Neurological exam reveals alert, oriented, normal speech, no focal findings or movement disorder noted.   Pelvic: exam chaperoned by nurse;  Vulva: normal appearing vulva with no masses, tenderness or lesions; Vagina: normal vagina; Adnexa: no masses/nodularity but limited exam Uterus: unable to determine size, nontender Cervix: no lesions; Rectal: not indicated     Lab Review As per HPI  Radiologic Imaging: 09/22/2020 COMPARISON:  Jan 04, 2008  FINDINGS: Lower chest: Lung bases are clear. Pacemaker leads are attached to the right atrium and right ventricle.  Hepatobiliary: No focal liver lesions are appreciable. Gallbladder is absent. There is no appreciable biliary duct dilatation allowing for post cholecystectomy state.  Pancreas: There is no pancreatic mass or inflammatory focus.  Spleen: No splenic lesions are evident.  Adrenals/Urinary Tract: Adrenals  bilaterally appear unremarkable. There is perinephric fluid and soft tissue stranding on the left. There is no appreciable renal mass on either side. There is moderate hydronephrosis on the left. There is no appreciable hydronephrosis on the right. There is dilatation of the left ureter on the left to the level of the left ureterovesical junction where there is a 6 x 5 mm calculus. No other ureteral calculi are evident. The urinary bladder is midline with wall thickness within normal limits.  Stomach/Bowel: There is no appreciable bowel wall or mesenteric thickening. There is no evident bowel obstruction. The terminal ileum appears unremarkable. There is no evident free air or portal venous air.  Vascular/Lymphatic: There is no abdominal aortic aneurysm. There is aortic and iliac artery atherosclerotic vascular calcification. There is no appreciable adenopathy in the abdomen or pelvis.  Reproductive: Uterus is anteverted. There is again noted an apparent dermoid arising from the right ovary measuring 3.2 x 3.0 cm. There is predominant fat within this dermoid. No other adnexal mass evident.  Other: Appendix appears unremarkable. No evident abscess or ascites in the abdomen or pelvis.  Musculoskeletal: There are multiple foci of degenerative change in the lumbar spine. There is spinal stenosis at L4-5 due to diffuse disc protrusion and bony hypertrophy. No blastic or lytic bone lesions are evident. No intramuscular or abdominal wall lesions are evident.  IMPRESSION: 1. 6 x 5 mm calculus at the left ureterovesical junction with moderate hydronephrosis on the left. There is left perinephric soft tissue stranding and fluid. There is also moderate ureterectasis on the left.  2.  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  3. No evident bowel obstruction. No abscess in the abdomen or pelvis. Appendix appears normal.  4. Spinal stenosis at L4-5 due to disc  protrusion and bony hypertrophy.  5.  Aortic Atherosclerosis (ICD10-I70.0).  6.  Absent gallbladder.   Electronically Signed   By: Lowella Grip III M.D.   On: 09/22/2020 12:14      Pelvic US as noted in HPI Assessment:  GENELLA BAS is a 70 y.o. female diagnosed with asymptomatic stable right ovarian dermoid cyst since 2009.   Elevated CA19-9 in the setting of breast cancer history.   H/o cardiomyopathy s/p pacemaker.   Medical co-morbidities complicating care: HTN, diabetes, obesity and prior abdominal surgery. History of breast cancer s/p chemotherapy, lumpectomy/radiation. History cardiomyopathy and AV BLOCK, 1ST DEGREE s/p Pacemaker.  H/o hypersomnolence.  Plan:   Problem List Items Addressed This Visit      Endocrine   Dermoid cyst of right ovary - Primary    Other Visit Diagnoses    Elevated CA 19-9 level          We discussed options for management. I don't think she has a malignancy. I spoke with the radiology team and the ovarian cystic lesion has been present since noted on imaging on Jan 04, 2008. Per radiology the Hounsfield units are consistent with high fat content which is c/w with a dermoid. She is asymptomatic. Per the [place] published in 2016 there are specific criteria when surveillance is acceptable. See below section and reference. She has comorbid conditions and prior to surgery, if entertained, I would strongly recommend a cardiology consult and preoperative optimization of pulmonary/cardiac function and glycemic control.   Sinha et al. "2.6. The Criteria for Surveillance (Expectant Management) Ovarian mature cystic teratomas grow over time, increasing the risk of pain and ovarian accidents. Therefore, surgical management is usually appropriate. There is no evidence-based consensus on the size above which surgical management should be considered. Most studies used an arbitrary maximum diameter of 5-6?cm amongst their inclusion  criteria to offer expectant management [2, 37, 38]. The risk of torsion is higher in larger teratomas [39]. Caspi et al. prospective study, to evaluate the progress of ovarian mature cystic teratoma over 3-5 years, showed that the mean rate of growth of the cyst was 1.5-1.7?mm per year. The mean rate of growth was extremely slow in premenopausal women with initial cyst size of <6?cm. Therefore, they recommended surveillance with serial TVS for as long as the rate of growth is not more than 2?cm per year [38]. Although the TVS appearance of immature teratomas is nonspecific, the tumors are typically heterogeneous with scattered coarse calcifications and large irregular solid components. Surveillance is not recommended for such cases [40]. The [employer] of Obstetricians and Gynecologists (ACOG) guidelines stated that the frequency of repeat TVS or length of the follow-up has not been determined. The guidelines recommend repeat TVS when the morphology of the mass suggests benign disease and/or there is a compelling reason to avoid surgical intervention, for example, substantial risk for perioperative morbidity and mortality [1]." [email or web address]  Elevated CA19-9 in the setting of prior breast cancer. I spoke with Dr. Grayland Ormond and he will arrange for a follow up lab assessment and visit in one month.   Suggested return to clinic in prn.   The patient's diagnosis, an outline of the further diagnostic and laboratory studies which will be required, the recommendation, and alternatives were discussed.  All questions were answered to the patient's satisfaction.  A total of at least 80 minutes were spent with the patient/family today; >50% was spent in education, counseling and coordination of care for right ovarian cyst and elevated  CA19-9  Emersyn Kotarski Gaetana Michaelis, MD    CC: Dr. Gardiner Rhyme

## 2020-12-13 ENCOUNTER — Other Ambulatory Visit: Payer: Self-pay

## 2020-12-13 ENCOUNTER — Other Ambulatory Visit: Payer: Medicare PPO

## 2020-12-13 DIAGNOSIS — E039 Hypothyroidism, unspecified: Secondary | ICD-10-CM

## 2020-12-14 ENCOUNTER — Encounter: Payer: Self-pay | Admitting: Nurse Practitioner

## 2020-12-14 DIAGNOSIS — R7989 Other specified abnormal findings of blood chemistry: Secondary | ICD-10-CM | POA: Insufficient documentation

## 2020-12-14 LAB — T4, FREE: Free T4: 1.1 ng/dL (ref 0.82–1.77)

## 2020-12-14 LAB — TSH: TSH: 5.1 u[IU]/mL — ABNORMAL HIGH (ref 0.450–4.500)

## 2020-12-14 NOTE — Progress Notes (Signed)
Contacted via MyChart   Good morning [name], your thyroid labs have returned.  TSH is trending down from 5.710 to now 5.100 and Free T4 is in normal range.  Guidelines recommend not starting medication unless this level is 6 or greater consistently in your age group, since your Free T4 is normal and TSH is trending down I recommend we recheck this at visit in June.  If you start having any increased fatigue, mood changes, increased constipation/diarrhea, or anything else out of normal for you let me know.  Then we may check levels sooner.  Any questions? Keep being awesome!!  Thank you for allowing me to participate in your care. Kindest regards, Oluwatosin Bracy

## 2020-12-27 ENCOUNTER — Ambulatory Visit: Payer: Medicare PPO | Admitting: Obstetrics and Gynecology

## 2020-12-27 ENCOUNTER — Encounter: Payer: Self-pay | Admitting: Obstetrics and Gynecology

## 2020-12-27 ENCOUNTER — Other Ambulatory Visit: Payer: Self-pay

## 2020-12-27 VITALS — BP 124/70 | Ht 63.0 in | Wt 230.8 lb

## 2020-12-27 DIAGNOSIS — N83201 Unspecified ovarian cyst, right side: Secondary | ICD-10-CM

## 2020-12-27 NOTE — Progress Notes (Signed)
Patient ID: [name], female   DOB: February 24, 1951, 70 y.o.   MRN: [id number]  Reason for Consult: Surgery Consult   Referred by Venita Lick, NP  Subjective:     HPI:  [name] is a 70 y.o. female.  She is following up today after consultation with gynecological oncology.  Visit with them was favorable as they suspected low likelihood of malignancy.  She has been considering if she would like to continue to observe the dermoid which has likely been present since 2009 or consider surgical removal.  She reports today that she is most comfortable with surgical removal and would like to proceed with surgical planning for removal of her right ovary.  As discussed with Dr. Theora Gianotti, given her history of cardiac disease and a pacemaker present patient will need cardiac clearance before her surgery.  The patient does have a history of HER2/neu positive breast cancer and she has follow-up planned shortly with oncology regarding her elevated CA 19-9.  She is a type II diabetic and has favorable glucose control with a hemoglobin A1c 4 months ago at 5.7.  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  No LMP recorded. Patient is postmenopausal.   Past Medical History:  Diagnosis Date  . Breast CA ([place])    Chemo/XRT/lumpectomy  . Breast cancer ([place]) 09/2007   left breast, radiation, chemo  . Cardiomyopathy secondary   . Hypersomnolence 09/21/2013  . Leg edema, left 09/21/2013  . 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 Ovarian cyst 11/06/2020  . Pacemaker [employer]   . Sinus tachycardia   . Type 2 diabetes mellitus ([place]) 06/19/2015   Family History  Problem Relation Age of Onset  . Diabetes Mother   . Heart disease Mother   . Thyroid disease Mother   . Hypertension Father   . Cancer Father   . Alcohol abuse Father   . Diabetes Father   . Stroke Other         Family hx of CVA or stroke  . Diabetes Brother   . Diabetes Daughter   . Diabetes Paternal Uncle   . Diabetes Paternal Grandmother   . Breast cancer Maternal Aunt    Past Surgical History:  Procedure Laterality Date  . 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 BREAST LUMPECTOMY    . CESAREAN SECTION  1983  . CHOLECYSTECTOMY    . EYE SURGERY Left 12/2018   cataracts-Dr.Shah in [place]   . EYE SURGERY Right 01/2019   cataracts- Dr.Shah in [place]  . [place]  . INSERT / REPLACE / REMOVE PACEMAKER  2011   revision @ [place]  05/13/2003   [employer] - DR    Short Social History:  Social History   Tobacco Use  . Smoking status: Former Smoker    Quit date: 08/26/1981    Years since quitting: 39.3  . Smokeless tobacco: Never Used  Substance Use Topics  . Alcohol use: Not Currently    Allergies  Allergen Reactions  . Penicillins Swelling    Causes organs to swell Other reaction(s): Other (See Comments) Organs flipped over per patient; skin starting splitting open and blood vessels burst in legs;  Causes organs to swell  .  Levofloxacin Swelling    Leg swelling and muscle weakness Other reaction(s): Other (See Comments) Tendon pain and weakness in muscles Leg swelling and muscle weakness    Current Outpatient Medications  Medication Sig Dispense Refill  . aMILoride (MIDAMOR) 5 MG tablet Take 1 tablet (5 mg total) by mouth daily. 90 tablet 4  . atenolol (TENORMIN) 50 MG tablet Take 1 tablet (50 mg total) by mouth daily. 90 tablet 4  . atorvastatin (LIPITOR) 40 MG tablet Take 1 tablet (40 mg total) by mouth daily. 90 tablet 4  . azelastine (ASTELIN) 0.1 % nasal spray Place into both nostrils 2 (two) times daily. Use in each nostril as directed    . chlorpheniramine-HYDROcodone (TUSSIONEX PENNKINETIC ER) 10-8 MG/5ML SUER Take 5 mLs by  mouth every 12 (twelve) hours as needed for cough. 60 mL 0  . CINNAMON PO Take by mouth daily.    . esomeprazole (NEXIUM) 20 MG capsule Take 20 mg by mouth daily.    . fluticasone (FLONASE) 50 MCG/ACT nasal spray Place 2 sprays into both nostrils daily. 16 g 12  . fluticasone (FLOVENT HFA) 110 MCG/ACT inhaler Inhale into the lungs 2 (two) times daily.    . glucose blood (ACCU-CHEK AVIVA PLUS) test strip TEST BLOOD SUGAR ONCE DAILY 100 each 12  . levocetirizine (XYZAL) 5 MG tablet Take 5 mg by mouth every evening.    . magnesium oxide (MAG-OX) 400 MG tablet Take 400 mg by mouth 2 (two) times daily.    . melatonin 3 MG TABS tablet Take 3 mg by mouth at bedtime.    . meloxicam (MOBIC) 15 MG tablet Take 1 tablet by mouth daily.    . metFORMIN (GLUCOPHAGE) 500 MG tablet Take 1 tablet (500 mg total) by mouth 2 (two) times daily with a meal. 180 tablet 4  . montelukast (SINGULAIR) 10 MG tablet Take 1 tablet (10 mg total) by mouth at bedtime. 90 tablet 4  . nystatin (MYCOSTATIN/NYSTOP) powder Apply 1 application topically 2 (two) times daily. 15 g 4  . pregabalin (LYRICA) 50 MG capsule Take 1 capsule (50 mg total) by mouth 3 (three) times daily. 270 capsule 4  . SUMAtriptan (IMITREX) 100 MG tablet Take 1 tablet (100 mg total) by mouth as needed. 10 tablet 12   No current facility-administered medications for this visit.    Review of Systems  Constitutional: Negative for chills, fatigue, fever and unexpected weight change.  HENT: Negative for trouble swallowing.  Eyes: Negative for loss of vision.  Respiratory: Negative for cough, shortness of breath and wheezing.  Cardiovascular: Negative for chest pain, leg swelling, palpitations and syncope.  GI: Negative for abdominal pain, blood in stool, diarrhea, nausea and vomiting.  GU: Negative for difficulty urinating, dysuria, frequency and hematuria.  Musculoskeletal: Negative for back pain, leg pain and joint pain.  Skin: Negative for rash.   Neurological: Negative for dizziness, headaches, light-headedness, numbness and seizures.  Psychiatric: Negative for behavioral problem, confusion, depressed mood and sleep disturbance.        Objective:  Objective   Vitals:   12/27/20 1111  BP: 124/70  Weight: 230 lb 12.8 oz (104.7 kg)  Height: 5' 3" (1.6 m)   Body mass index is 40.88 kg/m.  Physical Exam Vitals and nursing note reviewed. Exam conducted with a chaperone present.  Constitutional:      Appearance: Normal appearance.  HENT:     Head: Normocephalic and atraumatic.  Eyes:     Extraocular Movements: Extraocular movements intact.       Pupils: Pupils are equal, round, and reactive to light.  Cardiovascular:     Rate and Rhythm: Normal rate and regular rhythm.  Pulmonary:     Effort: Pulmonary effort is normal.     Breath sounds: Normal breath sounds.  Abdominal:     General: Abdomen is flat.     Palpations: Abdomen is soft.  Musculoskeletal:     Cervical back: Normal range of motion.  Skin:    General: Skin is warm and dry.  Neurological:     General: No focal deficit present.     Mental Status: She is alert and oriented to person, place, and time.  Psychiatric:        Behavior: Behavior normal.        Thought Content: Thought content normal.        Judgment: Judgment normal.     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  We will plan to send her right ovary for frozen at the time of the surgery.  If for some reason a malignancy is identified we will proceed with bilateral oophorectomy and hysterectomy if needed.  Note sent to surgical. She will need cardiac clearance.   More than 20 minutes were spent face to face with the patient in the room, reviewing the medical record, labs and images, and coordinating care for the patient. The plan of management was discussed in detail and counseling was provided.   Adrian Prows MD [facility] 12/27/2020 11:51 AM

## 2020-12-27 NOTE — H&P (View-Only) (Signed)
Patient ID: [name], female   DOB: Sep 22, 1950, 70 y.o.   MRN: [id number]  Reason for Consult: Surgery Consult   Referred by Venita Lick, NP  Subjective:     HPI:  [name] is a 70 y.o. female.  She is following up today after consultation with gynecological oncology.  Visit with them was favorable as they suspected low likelihood of malignancy.  She has been considering if she would like to continue to observe the dermoid which has likely been present since 2009 or consider surgical removal.  She reports today that she is most comfortable with surgical removal and would like to proceed with surgical planning for removal of her right ovary.  As discussed with Dr. Theora Gianotti, given her history of cardiac disease and a pacemaker present patient will need cardiac clearance before her surgery.  The patient does have a history of HER2/neu positive breast cancer and she has follow-up planned shortly with oncology regarding her elevated CA 19-9.  She is a type II diabetic and has favorable glucose control with a hemoglobin A1c 4 months ago at 5.7.  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  No LMP recorded. Patient is postmenopausal.   Past Medical History:  Diagnosis Date  . Breast CA ([place])    Chemo/XRT/lumpectomy  . Breast cancer ([place]) 09/2007   left breast, radiation, chemo  . Cardiomyopathy secondary   . Hypersomnolence 09/21/2013  . Leg edema, left 09/21/2013  . 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 Ovarian cyst 11/06/2020  . Pacemaker [employer]   . Sinus tachycardia   . Type 2 diabetes mellitus ([place]) 06/19/2015   Family History  Problem Relation Age of Onset  . Diabetes Mother   . Heart disease Mother   . Thyroid disease Mother   . Hypertension Father   . Cancer Father   . Alcohol abuse Father   . Diabetes Father   . Stroke Other         Family hx of CVA or stroke  . Diabetes Brother   . Diabetes Daughter   . Diabetes Paternal Uncle   . Diabetes Paternal Grandmother   . Breast cancer Maternal Aunt    Past Surgical History:  Procedure Laterality Date  . 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 BREAST LUMPECTOMY    . CESAREAN SECTION  1983  . CHOLECYSTECTOMY    . EYE SURGERY Left 12/2018   cataracts-Dr.Shah in [place]   . EYE SURGERY Right 01/2019   cataracts- Dr.Shah in [place]  . [place]  . INSERT / REPLACE / REMOVE PACEMAKER  2011   revision @ [place]  05/13/2003   [employer] Comp - DR    Short Social History:  Social History   Tobacco Use  . Smoking status: Former Smoker    Quit date: 08/26/1981    Years since quitting: 39.3  . Smokeless tobacco: Never Used  Substance Use Topics  . Alcohol use: Not Currently    Allergies  Allergen Reactions  . Penicillins Swelling    Causes organs to swell Other reaction(s): Other (See Comments) Organs flipped over per patient; skin starting splitting open and blood vessels burst in legs;  Causes organs to swell  .  Levofloxacin Swelling    Leg swelling and muscle weakness Other reaction(s): Other (See Comments) Tendon pain and weakness in muscles Leg swelling and muscle weakness    Current Outpatient Medications  Medication Sig Dispense Refill  . aMILoride (MIDAMOR) 5 MG tablet Take 1 tablet (5 mg total) by mouth daily. 90 tablet 4  . atenolol (TENORMIN) 50 MG tablet Take 1 tablet (50 mg total) by mouth daily. 90 tablet 4  . atorvastatin (LIPITOR) 40 MG tablet Take 1 tablet (40 mg total) by mouth daily. 90 tablet 4  . azelastine (ASTELIN) 0.1 % nasal spray Place into both nostrils 2 (two) times daily. Use in each nostril as directed    . chlorpheniramine-HYDROcodone (TUSSIONEX PENNKINETIC ER) 10-8 MG/5ML SUER Take 5 mLs by  mouth every 12 (twelve) hours as needed for cough. 60 mL 0  . CINNAMON PO Take by mouth daily.    Marland Kitchen esomeprazole (NEXIUM) 20 MG capsule Take 20 mg by mouth daily.    . fluticasone (FLONASE) 50 MCG/ACT nasal spray Place 2 sprays into both nostrils daily. 16 g 12  . fluticasone (FLOVENT HFA) 110 MCG/ACT inhaler Inhale into the lungs 2 (two) times daily.    Marland Kitchen glucose blood (ACCU-CHEK AVIVA PLUS) test strip TEST BLOOD SUGAR ONCE DAILY 100 each 12  . levocetirizine (XYZAL) 5 MG tablet Take 5 mg by mouth every evening.    . magnesium oxide (MAG-OX) 400 MG tablet Take 400 mg by mouth 2 (two) times daily.    . melatonin 3 MG TABS tablet Take 3 mg by mouth at bedtime.    . meloxicam (MOBIC) 15 MG tablet Take 1 tablet by mouth daily.    . metFORMIN (GLUCOPHAGE) 500 MG tablet Take 1 tablet (500 mg total) by mouth 2 (two) times daily with a meal. 180 tablet 4  . montelukast (SINGULAIR) 10 MG tablet Take 1 tablet (10 mg total) by mouth at bedtime. 90 tablet 4  . nystatin (MYCOSTATIN/NYSTOP) powder Apply 1 application topically 2 (two) times daily. 15 g 4  . pregabalin (LYRICA) 50 MG capsule Take 1 capsule (50 mg total) by mouth 3 (three) times daily. 270 capsule 4  . SUMAtriptan (IMITREX) 100 MG tablet Take 1 tablet (100 mg total) by mouth as needed. 10 tablet 12   No current facility-administered medications for this visit.    Review of Systems  Constitutional: Negative for chills, fatigue, fever and unexpected weight change.  HENT: Negative for trouble swallowing.  Eyes: Negative for loss of vision.  Respiratory: Negative for cough, shortness of breath and wheezing.  Cardiovascular: Negative for chest pain, leg swelling, palpitations and syncope.  GI: Negative for abdominal pain, blood in stool, diarrhea, nausea and vomiting.  GU: Negative for difficulty urinating, dysuria, frequency and hematuria.  Musculoskeletal: Negative for back pain, leg pain and joint pain.  Skin: Negative for rash.   Neurological: Negative for dizziness, headaches, light-headedness, numbness and seizures.  Psychiatric: Negative for behavioral problem, confusion, depressed mood and sleep disturbance.        Objective:  Objective   Vitals:   12/27/20 1111  BP: 124/70  Weight: 230 lb 12.8 oz (104.7 kg)  Height: 5' 3" (1.6 m)   Body mass index is 40.88 kg/m.  Physical Exam Vitals and nursing note reviewed. Exam conducted with a chaperone present.  Constitutional:      Appearance: Normal appearance.  HENT:     Head: Normocephalic and atraumatic.  Eyes:     Extraocular Movements: Extraocular movements intact.  Pupils: Pupils are equal, round, and reactive to light.  Cardiovascular:     Rate and Rhythm: Normal rate and regular rhythm.  Pulmonary:     Effort: Pulmonary effort is normal.     Breath sounds: Normal breath sounds.  Abdominal:     General: Abdomen is flat.     Palpations: Abdomen is soft.  Musculoskeletal:     Cervical back: Normal range of motion.  Skin:    General: Skin is warm and dry.  Neurological:     General: No focal deficit present.     Mental Status: She is alert and oriented to person, place, and time.  Psychiatric:        Behavior: Behavior normal.        Thought Content: Thought content normal.        Judgment: Judgment normal.     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  We will plan to send her right ovary for frozen at the time of the surgery.  If for some reason a malignancy is identified we will proceed with bilateral oophorectomy and hysterectomy if needed.  Note sent to surgical. She will need cardiac clearance.   More than 20 minutes were spent face to face with the patient in the room, reviewing the medical record, labs and images, and coordinating care for the patient. The plan of management was discussed in detail and counseling was provided.   Adrian Prows MD [facility] 12/27/2020 11:51 AM

## 2020-12-28 ENCOUNTER — Ambulatory Visit: Payer: Medicare PPO | Admitting: Dermatology

## 2020-12-29 ENCOUNTER — Telehealth: Payer: Self-pay

## 2020-12-29 NOTE — Progress Notes (Signed)
[name]  [phone] [phone]  ID: [name] OB: Jul 04, 1951  MR#: [id number]  AOZ#:[id number]  Patient Care Team: Venita Lick, NP as PCP - General (Nurse Practitioner) Lloyd Huger, MD as Consulting Physician (Oncology) Deboraha Sprang, MD as Consulting Physician (Cardiology)  I connected with [name] on 01/02/21 at  3:30 PM EDT by video enabled telemedicine visit and verified that I am speaking with the correct person using two identifiers.   I discussed the limitations, risks, security and privacy concerns of performing an evaluation and management service by telemedicine and the availability of in-person appointments. I also discussed with the patient that there may be a patient responsible charge related to this service. The patient expressed understanding and agreed to proceed.   Other persons participating in the visit and their role in the encounter: Patient, MD.  Patient's location: Home. Provider's location: Clinic.  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  INTERVAL HISTORY: Patient referred back to clinic with an elevated CA 19-9 in the setting of what appears to be a benign ovarian lesion.  She agreed to video assisted telemedicine visit to discuss her laboratory results.  She currently feels well and is asymptomatic. Her peripheral neuropathy is unchanged.  She has no other neurologic complaints.  She denies any recent fevers or illnesses.  She denies any chest pain, shortness of breath, cough, or hemoptysis.  She has no nausea, vomiting, constipation, or diarrhea.  She has no urinary complaints.  Patient offers no further specific complaints today.  REVIEW OF SYSTEMS:   Review of Systems  Constitutional: Negative.  Negative for fever, malaise/fatigue and weight loss.  Respiratory: Negative.  Negative for cough and shortness of breath.    Cardiovascular: Negative.  Negative for chest pain and leg swelling.  Gastrointestinal: Negative.  Negative for abdominal pain.  Genitourinary: Negative.  Negative for dysuria.  Musculoskeletal: Negative.  Negative for back pain.       Occasional muscle cramping  Skin: Negative.  Negative for rash.  Neurological: Positive for tingling. Negative for sensory change, focal weakness, weakness and headaches.  Psychiatric/Behavioral: Negative.  The patient is not nervous/anxious.    As per HPI. Otherwise, a complete review of systems is negative.   PAST MEDICAL HISTORY: Past Medical History:  Diagnosis Date  . Breast CA ([facility])    Chemo/XRT/lumpectomy  . Breast cancer ([facility]) 09/2007   left breast, radiation, chemo  . Cardiomyopathy secondary   . Hypersomnolence 09/21/2013  . Leg edema, left 09/21/2013  . 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 Ovarian cyst 11/06/2020  . Pacemaker [employer]   . Sinus tachycardia   . Type 2 diabetes mellitus ([facility]) 06/19/2015    PAST SURGICAL HISTORY: Past Surgical History:  Procedure Laterality Date  . 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 BREAST LUMPECTOMY    . CESAREAN SECTION  1983  . CHOLECYSTECTOMY    . EYE SURGERY Left 12/2018   cataracts-Dr.Shah in [place]   . EYE SURGERY Right 01/2019   cataracts- Dr.Shah in [place]  . [place]  . INSERT / REPLACE / REMOVE PACEMAKER  2011   revision @ [facility]  05/13/2003   [facility] Comp - DR    FAMILY HISTORY Family History  Problem Relation Age of Onset  .  Diabetes Mother   . Heart disease Mother   . Thyroid disease Mother   . Hypertension Father   . Cancer Father   . Alcohol abuse Father   . Diabetes Father   . Stroke Other        Family hx of CVA or stroke  . Diabetes Brother   . Diabetes Daughter   . Diabetes Paternal Uncle    . Diabetes Paternal Grandmother   . Breast cancer Maternal Aunt        ADVANCED DIRECTIVES:    HEALTH MAINTENANCE: Social History   Tobacco Use  . Smoking status: Former Smoker    Quit date: 08/26/1981    Years since quitting: 39.3  . Smokeless tobacco: Never Used  Vaping Use  . Vaping Use: Never used  Substance Use Topics  . Alcohol use: Not Currently  . Drug use: No     Colonoscopy:  PAP:  Bone density:  Lipid panel:  Allergies  Allergen Reactions  . Penicillins Swelling    Causes organs to swell Other reaction(s): Other (See Comments) Organs flipped over per patient; skin starting splitting open and blood vessels burst in legs;  Causes organs to swell  . Levofloxacin Swelling    Leg swelling and muscle weakness Other reaction(s): Other (See Comments) Tendon pain and weakness in muscles Leg swelling and muscle weakness    Current Outpatient Medications  Medication Sig Dispense Refill  . aMILoride (MIDAMOR) 5 MG tablet Take 1 tablet (5 mg total) by mouth daily. 90 tablet 4  . atenolol (TENORMIN) 50 MG tablet Take 1 tablet (50 mg total) by mouth daily. 90 tablet 4  . atorvastatin (LIPITOR) 40 MG tablet Take 1 tablet (40 mg total) by mouth daily. 90 tablet 4  . azelastine (ASTELIN) 0.1 % nasal spray Place into both nostrils 2 (two) times daily. Use in each nostril as directed    . chlorpheniramine-HYDROcodone (TUSSIONEX PENNKINETIC ER) 10-8 MG/5ML SUER Take 5 mLs by mouth every 12 (twelve) hours as needed for cough. 60 mL 0  . CINNAMON PO Take by mouth daily.    Marland Kitchen esomeprazole (NEXIUM) 20 MG capsule Take 20 mg by mouth daily.    . fluticasone (FLONASE) 50 MCG/ACT nasal spray Place 2 sprays into both nostrils daily. 16 g 12  . fluticasone (FLOVENT HFA) 110 MCG/ACT inhaler Inhale into the lungs 2 (two) times daily.    Marland Kitchen glucose blood (ACCU-CHEK AVIVA PLUS) test strip TEST BLOOD SUGAR ONCE DAILY 100 each 12  . levocetirizine (XYZAL) 5 MG tablet Take 5 mg by mouth  every evening.    . magnesium oxide (MAG-OX) 400 MG tablet Take 400 mg by mouth 2 (two) times daily.    . melatonin 3 MG TABS tablet Take 3 mg by mouth at bedtime.    . meloxicam (MOBIC) 15 MG tablet Take 1 tablet by mouth daily.    . metFORMIN (GLUCOPHAGE) 500 MG tablet Take 1 tablet (500 mg total) by mouth 2 (two) times daily with a meal. 180 tablet 4  . montelukast (SINGULAIR) 10 MG tablet Take 1 tablet (10 mg total) by mouth at bedtime. 90 tablet 4  . nystatin (MYCOSTATIN/NYSTOP) powder Apply 1 application topically 2 (two) times daily. 15 g 4  . pregabalin (LYRICA) 50 MG capsule Take 1 capsule (50 mg total) by mouth 3 (three) times daily. 270 capsule 4  . SUMAtriptan (IMITREX) 100 MG tablet Take 1 tablet (100 mg total) by mouth as needed. 10 tablet 12  No current facility-administered medications for this visit.    OBJECTIVE: There were no vitals filed for this visit.   There is no height or weight on file to calculate BMI.    ECOG FS:0 - Asymptomatic  General: Well-developed, well-nourished, no acute distress. HEENT: Normocephalic. Neuro: Alert, answering all questions appropriately. Cranial nerves grossly intact. Psych: Normal affect.  LAB RESULTS:  Lab Results  Component Value Date   NA 143 11/07/2020   K 5.0 11/07/2020   CL 101 11/07/2020   CO2 23 11/07/2020   GLUCOSE 109 (H) 11/07/2020   BUN 24 11/07/2020   CREATININE 1.08 (H) 11/07/2020   CALCIUM 9.9 11/07/2020   PROT 6.7 11/07/2020   ALBUMIN 4.6 11/07/2020   AST 18 11/07/2020   ALT 18 11/07/2020   ALKPHOS 107 11/07/2020   BILITOT 0.5 11/07/2020   GFRNONAA 61 10/04/2020   GFRAA 70 10/04/2020    Lab Results  Component Value Date   WBC 6.5 11/07/2020   NEUTROABS 3.6 11/07/2020   HGB 12.5 11/07/2020   HCT 37.2 11/07/2020   MCV 89 11/07/2020   PLT 196 11/07/2020     STUDIES: No results [name].  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  Patient completed year long maintenance Herceptin on Jan 02, 2009.  Patient's most recent mammogram on Jan 14, 2020 was reported as BI-RADS 1.  Repeat in May 2022.  Patient is now greater than 10 years out from completing her treatments and can be discharged from clinic.  Recommend primary care physician continue to order yearly screening mammograms.  Please refer patient back if there are any questions or concerns.  2.  Hypomagnesemia: Chronic and unchanged.  Despite historically receiving periodic magnesium infusions, her levels do not seem to change and remain chronically decreased.  Continue amiloride 5 mg daily and 3 tabs Slow-Mag twice a day.  No further follow-up as above. 3.  Pacemaker: Continue monitoring and evaluation per cardiology.  4.  Benign ovarian lesion: Patient is undergoing surgery later this month. 5.  Elevated CA 19-9: Unclear's clinical significance in a patient without active malignancy.  Level has trended down from 72-53.  Recommend undergoing oophorectomy as above and then will repeat laboratory work in 3 months.  If it continues to decrease or normalizes, no further follow-up is scheduled.  If it is increased arising, will schedule video assisted telemedicine visit to further discuss.  I provided 20 minutes of face-to-face video visit time during this encounter which included chart review, counseling, and coordination of care as documented above.   Patient expressed understanding and was in agreement with this plan. She also understands that She can call clinic at any time with any questions, concerns, or complaints.    Lloyd Huger, MD   01/02/2021 4:31 PM

## 2020-12-29 NOTE — Telephone Encounter (Signed)
Called patient to schedule robotic left oophorectomy w Gilman Schmidt  DOS 6/9  H&P n/a   Covid testing 6/7 @ [facility], [place] [address]. Advised pt to mask until DOS.  Pre-admit phone call appointment to be requested. All appointments will be updated on pt MyChart. Explained that this appointment has a call window. Based on the time scheduled will indicate if the call will be received within a 4 hour window before 1:00 or after.  Advised that pt may also receive calls from the hospital pharmacy and pre-service center.  Confirmed pt has [employer] as primary insurance. No secondary insurance.

## 2020-12-29 NOTE — Telephone Encounter (Signed)
-----   Message from Homero Fellers, MD sent at 12/27/2020 11:51 AM EDT ----- Surgery Booking Request Patient Full Name:  [name]  MRN: [id number]  DOB: [birth year]  Surgeon: Homero Fellers, MD  Requested Surgery Date and Time: Patient would like end of May or beginning of June- Thursday surgery.  Primary Diagnosis AND Code: left ovarian cyst Secondary Diagnosis and Code:  Surgical Procedure: robotic left oophorectomy RNFA Requested?: No L&D Notification: No Admission Status: surgery admit overnight for 23 hours ( I think that's observation) Length of Surgery: 75 min Special Case Needs: No H&P: No Phone Interview???:  no, in person Interpreter: Yes Medical Clearance:  yes- needs cardiac clearance Special Scheduling Instructions: No Any known health/anesthesia issues, diabetes, sleep apnea, latex allergy, defibrillator/pacemaker?: Yes- pacemaker Acuity: P3   (P1 highest, P2 delay may cause harm, P3 low, elective gyn, P4 lowest)

## 2021-01-01 ENCOUNTER — Inpatient Hospital Stay: Payer: Medicare PPO | Attending: Oncology

## 2021-01-01 DIAGNOSIS — C50912 Malignant neoplasm of unspecified site of left female breast: Secondary | ICD-10-CM

## 2021-01-01 DIAGNOSIS — R978 Other abnormal tumor markers: Secondary | ICD-10-CM | POA: Diagnosis present

## 2021-01-01 DIAGNOSIS — Z87891 Personal history of nicotine dependence: Secondary | ICD-10-CM | POA: Diagnosis not present

## 2021-01-01 DIAGNOSIS — Z853 Personal history of malignant neoplasm of breast: Secondary | ICD-10-CM | POA: Diagnosis not present

## 2021-01-02 ENCOUNTER — Inpatient Hospital Stay (HOSPITAL_BASED_OUTPATIENT_CLINIC_OR_DEPARTMENT_OTHER): Payer: Medicare PPO | Admitting: Oncology

## 2021-01-02 ENCOUNTER — Other Ambulatory Visit: Payer: Self-pay

## 2021-01-02 ENCOUNTER — Other Ambulatory Visit: Payer: Self-pay | Admitting: Oncology

## 2021-01-02 DIAGNOSIS — C50912 Malignant neoplasm of unspecified site of left female breast: Secondary | ICD-10-CM

## 2021-01-02 LAB — CA 19-9 (SERIAL): CA 19-9: 53 U/mL — ABNORMAL HIGH (ref 0–35)

## 2021-01-02 NOTE — Telephone Encounter (Signed)
Orders placed.

## 2021-01-10 ENCOUNTER — Other Ambulatory Visit: Payer: Self-pay

## 2021-01-10 ENCOUNTER — Encounter: Payer: Self-pay | Admitting: Internal Medicine

## 2021-01-10 ENCOUNTER — Encounter
Admission: RE | Admit: 2021-01-10 | Discharge: 2021-01-10 | Disposition: A | Discharge status: Home / Self Care | Payer: Medicare PPO | Source: Ambulatory Visit | Attending: Obstetrics and Gynecology | Admitting: Obstetrics and Gynecology

## 2021-01-10 ENCOUNTER — Telehealth: Payer: Self-pay | Admitting: *Deleted

## 2021-01-10 DIAGNOSIS — Z01818 Encounter for other preprocedural examination: Secondary | ICD-10-CM | POA: Insufficient documentation

## 2021-01-10 HISTORY — DX: Gastro-esophageal reflux disease without esophagitis: K21.9

## 2021-01-10 HISTORY — DX: Unspecified glaucoma: H40.9

## 2021-01-10 HISTORY — DX: Polyneuropathy, unspecified: G62.9

## 2021-01-10 HISTORY — DX: Unspecified osteoarthritis, unspecified site: M19.90

## 2021-01-10 HISTORY — DX: Headache, unspecified: R51.9

## 2021-01-10 HISTORY — DX: Hypothyroidism, unspecified: E03.9

## 2021-01-10 HISTORY — DX: Personal history of urinary calculi: Z87.442

## 2021-01-10 NOTE — Progress Notes (Addendum)
[facility] DEVICE PROGRAMMING  Patient Information: Name:  [name]  DOB:  03-29-1951  MRN:  [id number]    Karen Kitchens, NP  P Cv Div [facility] Device Planned Procedure:  ROBOTIC ASSISTED LEFT OOPHORECTOMY  Surgeon: Dr. Cherylann Ratel, MD  Date of Procedure: 01/18/2021  Cautery will be used.   Please send documentation back to:  [facility] (Fax # [phone])   Honor Loh, MSN, APRN, FNP-C, [place] Nurse Practitioner  Phone: [phone]  01/10/21 12:24 PM   Device Information:  Clinic EP Physician:  Virl Axe, MD   Device Type:  Pacemaker Manufacturer and Phone #:  [employer]: [phone] Pacemaker Dependent?:  No. Date of Last Device Check:  10/24/20 Normal Device Function?:  Yes.    Electrophysiologist's Recommendations:   Have magnet available.  Provide continuous ECG monitoring when magnet is used or reprogramming is to be performed.  Procedure should not interfere with device function.  No device programming or magnet placement needed.    Per Device Clinic Standing Orders, York Ram, RN  4:15 PM 01/10/2021   Per Device Clinic Standing Orders, York Ram, RN  2:47 PM 01/10/2021

## 2021-01-10 NOTE — Telephone Encounter (Signed)
Request for pre-operative cardiac clearance Received: Today Karen Kitchens, NP  P Cv Div Preop Callback Request for pre-operative cardiac clearance:    1. What type of surgery is being performed?  ROBOTIC ASSISTED LEFT OOPHORECTOMY   2. When is this surgery scheduled?  01/18/2021    3. Are there any medications that need to be held prior to surgery?  NONE   4. Practice name and name of physician performing surgery?  Performing surgeon: Dr. Cherylann Ratel, MD  Requesting clearance: Honor Loh, FNP-C     5. Anesthesia type (none, local, MAC, general)? General   6. What is the office phone and fax number?   Phone: [phone]  Fax: [phone]   ATTENTION: Unable to create telephone message as per your standard workflow. Directed by [facility] providers to send requests for cardiac clearance to this pool for appropriate distribution to provider covering pre-operative clearances.   Honor Loh, MSN, APRN, FNP-C, CEN  [facility]  Peri-operative Services Nurse Practitioner  Phone: [phone]  01/10/21 12:25 PM

## 2021-01-10 NOTE — Patient Instructions (Addendum)
Your procedure is scheduled on:01-18-21 THURSDAY Report to the Registration Desk on the 1st floor of the [facility]- Then proceed to the 2nd floor Surgery Desk in the [facility] To find out your arrival time, please call [phone] between 1PM - 3PM on:01-17-21 WEDNESDAY  REMEMBER: Instructions that are not followed completely may result in serious medical risk, up to and including death; or upon the discretion of your surgeon and anesthesiologist your surgery may need to be rescheduled.  Do not eat food after midnight the night before surgery.  No gum chewing, lozengers or hard candies.  You may however, drink WATER up to 2 hours before you are scheduled to arrive for your surgery. Do not drink anything within 2 hours of your scheduled arrival time.  Type 1 and Type 2 diabetics should only drink water.  TAKE THESE MEDICATIONS THE MORNING OF SURGERY WITH A SIP OF WATER: -ATENOLOL (TENORMIN) -LYRICA (PREGABALIN) -NEXIUM (ESOMEPRAZOLE)-take one the night before and one on the morning of surgery - helps to prevent nausea after surgery.)   Stop Metformin 2 days prior to surgery-LAST DOSE ON 01-15-21 MONDAY  One week prior to surgery: Stop Anti-inflammatories (NSAIDS) such as MOBIC (MELOXICAM), Advil, Aleve, Ibuprofen, Motrin, Naproxen, Naprosyn and Aspirin based products such as Excedrin migraine, Goodys Powder, BC Powder-OK TO TAKE TYLENOL IF NEEDED   Stop ANY OVER THE COUNTER supplements/vitamins NOW 01-10-21 until after surgery-STOP YOUR VITAMIN D, B12, CINNAMON, CRANBERRY-VITAMIN C AND MELATONIN-HOWEVER, CONTINUE YOUR MAGENSIUM-CALCIUM UP UNTIL THE DAY PRIOR TO SURGERY  No Alcohol for 24 hours before or after surgery.  No Smoking including e-cigarettes for 24 hours prior to surgery.  No chewable tobacco products for at least 6 hours prior to surgery.  No nicotine patches on the day of surgery.  Do not use any "recreational" drugs for at least a week prior to your surgery.   Please be advised that the combination of cocaine and anesthesia may have negative outcomes, up to and including death. If you test positive for cocaine, your surgery will be cancelled.  On the morning of surgery brush your teeth with toothpaste and water, you may rinse your mouth with mouthwash if you wish. Do not swallow any toothpaste or mouthwash.  Do not wear jewelry, make-up, hairpins, clips or nail polish.  Do not wear lotions, powders, or perfumes.   Do not shave body from the neck down 48 hours prior to surgery just in case you cut yourself which could leave a site for infection.  Also, freshly shaved skin may become irritated if using the CHG soap.  Contact lenses, hearing aids and dentures may not be worn into surgery.  Do not bring valuables to the hospital. [facility] is not responsible for any missing/lost belongings or valuables.   Use CHG Soap as directed on instruction sheet.  Notify your doctor if there is any change in your medical condition (cold, fever, infection).  Wear comfortable clothing (specific to your surgery type) to the hospital.  Plan for stool softeners for home use; pain medications have a tendency to cause constipation. You can also help prevent constipation by eating foods high in fiber such as fruits and vegetables and drinking plenty of fluids as your diet allows.  After surgery, you can help prevent lung complications by doing breathing exercises.  Take deep breaths and cough every 1-2 hours. Your doctor may order a device called an Incentive Spirometer to help you take deep breaths. When coughing or sneezing, hold a pillow firmly  against your incision with both hands. This is called "splinting." Doing this helps protect your incision. It also decreases belly discomfort.  If you are being admitted to the hospital overnight, leave your suitcase in the car. After surgery it may be brought to your room.  If you are being discharged the day of  surgery, you will not be allowed to drive home. You will need a responsible adult (18 years or older) to drive you home and stay with you that night.   If you are taking public transportation, you will need to have a responsible adult (18 years or older) with you. Please confirm with your physician that it is acceptable to use public transportation.   Please call the [facility] Dept. at [phone] if you have any questions about these instructions.  Surgery Visitation Policy:  Patients undergoing a surgery or procedure may have one family member or support person with them as long as that person is not COVID-19 positive or experiencing its symptoms.  That person may remain in the waiting area during the procedure.  Inpatient Visitation:    Visiting hours are 7 a.m. to 8 p.m. Inpatients will be allowed two visitors daily. The visitors may change each day during the patient's stay. No visitors under the age of 63. Any visitor under the age of 63 must be accompanied by an adult. The visitor must pass COVID-19 screenings, use hand sanitizer when entering and exiting the patient's room and wear a mask at all times, including in the patient's room. Patients must also wear a mask when staff or their visitor are in the room. Masking is required regardless of vaccination status.

## 2021-01-11 ENCOUNTER — Other Ambulatory Visit: Payer: Medicare PPO

## 2021-01-11 NOTE — Telephone Encounter (Signed)
[name] 70 year old female is requesting cardiac evaluation for robotic assisted left oophorectomy.  She was last seen in the clinic on 10/24/2020.  She was doing well at that time.  Her V pacing was decreased 30>>18.  Her PMH includes intermittent first-degree AV block, hypertension, diabetes, [employer] PPM, dyspnea.  Dr. Caryl Comes please advise on recommendations for pacemaker intraoperatively.  Thank you for your help.  Please direct response to CV DIV preop will.  [name]. [name] NP-C    01/11/2021, 10:15 AM [place] Office [phone] Fax [phone]

## 2021-01-15 ENCOUNTER — Encounter: Payer: Self-pay | Admitting: Obstetrics and Gynecology

## 2021-01-15 ENCOUNTER — Other Ambulatory Visit: Payer: Self-pay

## 2021-01-15 ENCOUNTER — Ambulatory Visit
Admission: RE | Admit: 2021-01-15 | Discharge: 2021-01-15 | Disposition: A | Discharge status: Home / Self Care | Payer: Medicare PPO | Source: Ambulatory Visit | Attending: Nurse Practitioner | Admitting: Nurse Practitioner

## 2021-01-15 DIAGNOSIS — Z1231 Encounter for screening mammogram for malignant neoplasm of breast: Secondary | ICD-10-CM | POA: Insufficient documentation

## 2021-01-15 NOTE — Progress Notes (Signed)
Perioperative Services  Pre-Admission/Anesthesia Testing Clinical Review  Date: 01/16/21  Patient Demographics:  Name: [name] DOB:   11-12-1950 MRN:   [id number]  Planned Surgical Procedure(s):    Case: [id number] Date/Time: 01/18/21 0839   Procedure: XI ROBOTIC ASSISTED RIGHT OOPHORECTOMY (Right )   Anesthesia type: Choice   Pre-op diagnosis: right ovarian cyst   Location: [facility] OR ROOM 06 / [place] [facility]   Surgeons: Homero Fellers, MD    NOTE: Available PAT nursing documentation and vital signs have been reviewed. Clinical nursing staff has updated patient's PMH/PSHx, current medication list, and drug allergies/intolerances to ensure comprehensive history available to assist in medical decision making as it pertains to the aforementioned surgical procedure and anticipated anesthetic course.   Clinical Discussion:  [name] is a 70 y.o. female who is submitted for pre-surgical anesthesia review and clearance prior to her undergoing the above procedure. Patient is a Former Smoker (15 pack years; quit 08/1981). 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 She was last seen in the cardiology clinic on 10/24/2020; notes reviewed.  Of her clinic visit, at the time patient doing well overall from a cardiovascular perspective.  She denied any chest pain, however complained of exertional dyspnea that was noted to be chronic and stable.  Patient also had some peripheral edema that relieved with lower extremity elevation.  Patient wheezing at night.  She denied any orthopnea, palpitations, vertiginous symptoms, or presyncope/syncope.  Patient with PMH significant for cardiovascular diagnoses.  Patient developed a high-grade first-degree AV  block requiring PPM placement.  Patient is seen regularly for device interrogation.  Additionally, patient with a secondary cardiomyopathy following chemotherapy used to treat her LEFT breast cancer.  Patient on GDMT for her HTN and HLD diagnoses.  Blood pressure well controlled at 122/68 on currently prescribed diuretic and beta-blocker therapies patient is on a statin for her HLD.  Functional capacity limited by chronic exertional dyspnea and obesity. No changes were made to patient's medication regimen. ECG revealed sinus rhythm with a first-degree AV block at a rate of 77 bpm.  No changes were made to patient's medication regimen.  There were changes made to her pacemaker settings.  She will continue interval device interrogations as previously ordered.  Patient to follow-up with outpatient cardiology and 12 months or sooner if needed.  Patient is scheduled for an elective gynecological procedure (oophorectomy) on 01/18/2021 with Dr. Waldron Labs.  Given patient's past medical history significant for cardiovascular diagnoses, presurgical cardiac clearance was sought by the PAT team. Per cardiology, "based on patient's past medical history and time since his last clinic visit, patient would be at an overall ACCEPTABLE risk for the planned procedure without further cardiovascular testing or intervention at this time". This patient is not on daily anticoagulation/antiplatelet therapies.  Patient denies previous perioperative complications with anesthesia in the past. In review of the available records, there are no records for review with regards to patient's past surgical/anesthetic courses.   Vitals with BMI 12/27/2020 12/06/2020 11/07/2020  Height '5\' 3"'  - -  Weight 230 lbs 13 oz 229 lbs -  BMI 22.97 - -  Systolic 989 211 941  Diastolic 70 68 83  Pulse - 76 65    Providers/Specialists:   NOTE: Primary physician provider listed below. Patient may have  been seen by APP or partner within same  practice.   PROVIDER ROLE / SPECIALTY LAST OV  Homero Fellers, MD  OB/GYN (surgeon)  12/27/2020  Venita Lick, NP  Primary Care Provider  12/13/2020  Adam Phenix, MD  Cardiology  10/24/2020  Delight Hoh, MD  Oncology  01/02/2021   Allergies:  Penicillins, Levofloxacin, and Other  Current Home Medications:   No current facility-administered medications for this encounter.   Marland Kitchen aMILoride (MIDAMOR) 5 MG tablet  . atenolol (TENORMIN) 50 MG tablet  . atorvastatin (LIPITOR) 40 MG tablet  . azelastine (ASTELIN) 0.1 % nasal spray  . CINNAMON PO  . esomeprazole (NEXIUM) 20 MG capsule  . fluticasone (FLONASE) 50 MCG/ACT nasal spray  . levocetirizine (XYZAL) 5 MG tablet  . magnesium oxide (MAG-OX) 400 MG tablet  . melatonin 3 MG TABS tablet  . meloxicam (MOBIC) 15 MG tablet  . metFORMIN (GLUCOPHAGE) 500 MG tablet  . montelukast (SINGULAIR) 10 MG tablet  . pregabalin (LYRICA) 50 MG capsule  . SUMAtriptan (IMITREX) 100 MG tablet  . aspirin-acetaminophen-caffeine (EXCEDRIN MIGRAINE) 250-250-65 MG tablet  . chlorpheniramine-HYDROcodone (TUSSIONEX PENNKINETIC ER) 10-8 MG/5ML SUER  . Cholecalciferol (VITAMIN D3) 50 MCG (2000 UT) TABS  . CRANBERRY-VITAMIN C PO  . Cyanocobalamin (VITAMIN B-12 PO)  . glucose blood (ACCU-CHEK AVIVA PLUS) test strip  . MAGNESIUM CHLORIDE-CALCIUM PO  . nystatin (MYCOSTATIN/NYSTOP) powder   History:   Past Medical History:  Diagnosis Date  . Arthritis   . Breast cancer, left ([place]) 09/2007   s/p lumpectomy and chemoradiation  . Cardiomyopathy secondary   . GERD (gastroesophageal reflux disease)   . Glaucoma   . History of first degree AV block    high grade; PPM placed  . History of kidney stones   . Hypersomnolence 09/21/2013  . Hypertension   . Hypothyroidism    no meds currently  . Leg edema, left 09/21/2013  . 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 Migraines   . 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 Ovarian cyst  11/06/2020  . Pacemaker [employer] 2004  . Personal history of chemotherapy 2009   left breast ca  . Personal history of radiation therapy 2009   left breast  . Sinus tachycardia   . Type 2 diabetes mellitus ([place]) 06/19/2015   Past Surgical History:  Procedure Laterality Date  . ANKLE SURGERY Left    x 3  . 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 BREAST LUMPECTOMY Left 2009   chemo and rad tx  . CESAREAN SECTION  1983  . CHOLECYSTECTOMY    . EYE SURGERY Left 12/2018   cataracts-Dr.Shah in [place]   . EYE SURGERY Right 01/2019   cataracts- Dr.Shah in [place]  . [place]  . INSERT / REPLACE / REMOVE PACEMAKER  2011   revision @ [facility]  05/13/2003   [facility] [facility] Comp - DR   Family History  Problem Relation Age of Onset  . Diabetes Mother   . Heart disease Mother   . Thyroid disease Mother   . Hypertension Father   . Cancer Father   . Alcohol abuse Father   . Diabetes Father   . Stroke Other        Family hx of CVA or stroke  . Diabetes Brother   . Diabetes Daughter   . Diabetes  Paternal Uncle   . Diabetes Paternal Grandmother   . Breast cancer Maternal Aunt    Social History   Tobacco Use  . Smoking status: Former Smoker    Packs/day: 1.00    Years: 15.00    Pack years: 15.00    Types: Cigarettes    Quit date: 08/26/1981    Years since quitting: 39.4  . Smokeless tobacco: Never Used  Vaping Use  . Vaping Use: Never used  Substance Use Topics  . Alcohol use: Not Currently  . Drug use: No    Pertinent Clinical Results:  LABS: Labs reviewed: Acceptable for surgery.  Hospital Outpatient Visit on 01/16/2021  Component Date Value Ref Range Status  . WBC 01/16/2021 5.4  4.0 - 10.5 K/uL Final  . RBC 01/16/2021 4.23  3.87 - 5.11 MIL/uL Final  . Hemoglobin 01/16/2021 12.5  12.0 - 15.0 g/dL Final  . HCT 01/16/2021 36.7   36.0 - 46.0 % Final  . MCV 01/16/2021 86.8  80.0 - 100.0 fL Final  . MCH 01/16/2021 29.6  26.0 - 34.0 pg Final  . MCHC 01/16/2021 34.1  30.0 - 36.0 g/dL Final  . RDW 01/16/2021 12.9  11.5 - 15.5 % Final  . Platelets 01/16/2021 186  150 - 400 K/uL Final  . nRBC 01/16/2021 0.0  0.0 - 0.2 % Final   Performed at [facility], [address] [place], [place] [address]  . Sodium 01/16/2021 139  135 - 145 mmol/L Final  . Potassium 01/16/2021 4.4  3.5 - 5.1 mmol/L Final  . Chloride 01/16/2021 104  98 - 111 mmol/L Final  . CO2 01/16/2021 25  22 - 32 mmol/L Final  . Glucose, Bld 01/16/2021 135* 70 - 99 mg/dL Final   Glucose reference range applies only to samples taken after fasting for at least 8 hours.  . BUN 01/16/2021 24* 8 - 23 mg/dL Final  . Creatinine, Ser 01/16/2021 1.02* 0.44 - 1.00 mg/dL Final  . Calcium 01/16/2021 9.3  8.9 - 10.3 mg/dL Final  . Total Protein 01/16/2021 6.8  6.5 - 8.1 g/dL Final  . Albumin 01/16/2021 4.2  3.5 - 5.0 g/dL Final  . AST 01/16/2021 19  15 - 41 U/L Final  . ALT 01/16/2021 24  0 - 44 U/L Final  . Alkaline Phosphatase 01/16/2021 78  38 - 126 U/L Final  . Total Bilirubin 01/16/2021 0.7  0.3 - 1.2 mg/dL Final  . GFR, Estimated 01/16/2021 59* >60 mL/min Final   Comment: (NOTE) Calculated using the CKD-EPI Creatinine Equation (2021)   . Anion gap 01/16/2021 10  5 - 15 Final   Performed at [facility], [address] [place], [place] [address]    ECG: Date: 10/24/2020 Time ECG obtained: 1101 AM Rate: 77 bpm Rhythm:  Sinus rhythm with first-degree AV block Axis (leads I and aVF): Normal Intervals: PR 242 ms. QRS 80 ms. QTc 423 ms. ST segment and T wave changes: No evidence of acute ST segment elevation or depression Comparison: Similar to previous tracing obtained on 10/19/2019.   IMAGING / PROCEDURES: NONE  Impression and Plan:  [name] has been referred for pre-anesthesia review and clearance prior to her undergoing the  planned anesthetic and procedural courses. Available labs, pertinent testing, and imaging results were personally reviewed by me. This patient has been appropriately cleared by cardiology with an overall ACCEPTABLE risk of significant perioperative cardiovascular complications. Completed perioperative prescription for cardiac device management documentation completed by primary cardiology  team and placed on patient's chart for review by the surgical/anesthetic team on the day of her procedure.   Based on clinical review performed today (01/16/21), barring any significant acute changes in the patient's overall condition, it is anticipated that she will be able to proceed with the planned surgical intervention. Any acute changes in clinical condition may necessitate her procedure being postponed and/or cancelled. Patient will meet with anesthesia team (MD and/or CRNA) on the day of her procedure for preoperative evaluation/assessment. Questions regarding anesthetic course will be fielded at that time.   Pre-surgical instructions were reviewed with the patient during her PAT appointment and questions were fielded by PAT clinical staff. Patient was advised that if any questions or concerns arise prior to her procedure then she should return a call to PAT and/or her surgeon's office to discuss.  Honor Loh, MSN, APRN, FNP-C, CEN [facility]  Peri-operative Services Nurse Practitioner Phone: [phone] 01/16/21 10:24 AM  NOTE: This note has been prepared using Dragon dictation software. Despite my best ability to proofread, there is always the potential that unintentional transcriptional errors may still occur from this process.

## 2021-01-15 NOTE — Telephone Encounter (Signed)
Notes faxed to surgeon. This phone note will be removed from the preop pool. Richardson Dopp, PA-C  01/15/2021 10:05 AM

## 2021-01-16 ENCOUNTER — Encounter
Admission: RE | Admit: 2021-01-16 | Discharge: 2021-01-16 | Disposition: A | Discharge status: Home / Self Care | Payer: Medicare PPO | Source: Ambulatory Visit | Attending: Obstetrics and Gynecology | Admitting: Obstetrics and Gynecology

## 2021-01-16 ENCOUNTER — Other Ambulatory Visit: Payer: Medicare PPO

## 2021-01-16 DIAGNOSIS — Z01812 Encounter for preprocedural laboratory examination: Secondary | ICD-10-CM | POA: Diagnosis present

## 2021-01-16 DIAGNOSIS — Z20822 Contact with and (suspected) exposure to covid-19: Secondary | ICD-10-CM | POA: Insufficient documentation

## 2021-01-16 LAB — COMPREHENSIVE METABOLIC PANEL
ALT: 24 U/L (ref 0–44)
AST: 19 U/L (ref 15–41)
Albumin: 4.2 g/dL (ref 3.5–5.0)
Alkaline Phosphatase: 78 U/L (ref 38–126)
Anion gap: 10 (ref 5–15)
BUN: 24 mg/dL — ABNORMAL HIGH (ref 8–23)
CO2: 25 mmol/L (ref 22–32)
Calcium: 9.3 mg/dL (ref 8.9–10.3)
Chloride: 104 mmol/L (ref 98–111)
Creatinine, Ser: 1.02 mg/dL — ABNORMAL HIGH (ref 0.44–1.00)
GFR, Estimated: 59 mL/min — ABNORMAL LOW (ref >60)
Glucose, Bld: 135 mg/dL — ABNORMAL HIGH (ref 70–99)
Potassium: 4.4 mmol/L (ref 3.5–5.1)
Sodium: 139 mmol/L (ref 135–145)
Total Bilirubin: 0.7 mg/dL (ref 0.3–1.2)
Total Protein: 6.8 g/dL (ref 6.5–8.1)

## 2021-01-16 LAB — TYPE AND SCREEN
ABO/RH(D): O NEG
Antibody Screen: NEGATIVE

## 2021-01-16 LAB — CBC
HCT: 36.7 % (ref 36.0–46.0)
Hemoglobin: 12.5 g/dL (ref 12.0–15.0)
MCH: 29.6 pg (ref 26.0–34.0)
MCHC: 34.1 g/dL (ref 30.0–36.0)
MCV: 86.8 fL (ref 80.0–100.0)
Platelets: 186 10*3/uL (ref 150–400)
RBC: 4.23 MIL/uL (ref 3.87–5.11)
RDW: 12.9 % (ref 11.5–15.5)
WBC: 5.4 10*3/uL (ref 4.0–10.5)
nRBC: 0 % (ref 0.0–0.2)

## 2021-01-16 LAB — SARS CORONAVIRUS 2 (TAT 6-24 HRS): SARS Coronavirus 2: NEGATIVE

## 2021-01-17 MED ORDER — LACTATED RINGERS IV SOLN: INTRAVENOUS | Status: DC

## 2021-01-17 MED ORDER — POVIDONE-IODINE 10 % EX SWAB: 2.0000 "application " | Freq: Once | CUTANEOUS | Status: DC

## 2021-01-17 MED ORDER — ORAL CARE MOUTH RINSE: 15.0000 mL | Freq: Once | OROMUCOSAL | Status: AC

## 2021-01-17 MED ORDER — CHLORHEXIDINE GLUCONATE 0.12 % MT SOLN
15.0000 mL | Freq: Once | OROMUCOSAL | Status: AC
  Administered 2021-01-18: 15 mL via OROMUCOSAL

## 2021-01-17 MED ORDER — SODIUM CHLORIDE 0.9 % IV SOLN
INTRAVENOUS | Status: DC
Start: 2021-01-17 — End: 2021-01-18

## 2021-01-18 ENCOUNTER — Encounter
Admission: RE | Disposition: A | Discharge status: Home / Self Care | Payer: Self-pay | Source: Home / Self Care | Attending: Obstetrics and Gynecology

## 2021-01-18 ENCOUNTER — Observation Stay
Admission: RE | Admit: 2021-01-18 | Discharge: 2021-01-19 | Disposition: A | Discharge status: Home / Self Care | Payer: Medicare PPO | Attending: Obstetrics and Gynecology | Admitting: Obstetrics and Gynecology

## 2021-01-18 ENCOUNTER — Encounter: Payer: Self-pay | Admitting: Obstetrics and Gynecology

## 2021-01-18 ENCOUNTER — Ambulatory Visit: Payer: Medicare PPO | Admitting: Urgent Care

## 2021-01-18 DIAGNOSIS — Z950 Presence of cardiac pacemaker: Secondary | ICD-10-CM | POA: Diagnosis not present

## 2021-01-18 DIAGNOSIS — Z90721 Acquired absence of ovaries, unilateral: Secondary | ICD-10-CM

## 2021-01-18 DIAGNOSIS — Z7984 Long term (current) use of oral hypoglycemic drugs: Secondary | ICD-10-CM | POA: Insufficient documentation

## 2021-01-18 DIAGNOSIS — Z853 Personal history of malignant neoplasm of breast: Secondary | ICD-10-CM | POA: Insufficient documentation

## 2021-01-18 DIAGNOSIS — Z87891 Personal history of nicotine dependence: Secondary | ICD-10-CM | POA: Insufficient documentation

## 2021-01-18 DIAGNOSIS — E119 Type 2 diabetes mellitus without complications: Secondary | ICD-10-CM | POA: Insufficient documentation

## 2021-01-18 DIAGNOSIS — Z79899 Other long term (current) drug therapy: Secondary | ICD-10-CM | POA: Insufficient documentation

## 2021-01-18 DIAGNOSIS — D3911 Neoplasm of uncertain behavior of right ovary: Secondary | ICD-10-CM

## 2021-01-18 DIAGNOSIS — N83291 Other ovarian cyst, right side: Secondary | ICD-10-CM | POA: Diagnosis present

## 2021-01-18 HISTORY — DX: Personal history of other diseases of the circulatory system: Z86.79

## 2021-01-18 HISTORY — DX: Migraine, unspecified, not intractable, without status migrainosus: G43.909

## 2021-01-18 HISTORY — PX: ROBOTIC ASSISTED SALPINGO OOPHERECTOMY: SHX6082

## 2021-01-18 LAB — ABO/RH: ABO/RH(D): O NEG

## 2021-01-18 LAB — GLUCOSE, CAPILLARY
Glucose-Capillary: 124 mg/dL — ABNORMAL HIGH (ref 70–99)
Glucose-Capillary: 148 mg/dL — ABNORMAL HIGH (ref 70–99)

## 2021-01-18 SURGERY — SALPINGO-OOPHORECTOMY, ROBOT-ASSISTED
Anesthesia: General | Laterality: Right

## 2021-01-18 MED ORDER — PROPOFOL 10 MG/ML IV BOLUS
INTRAVENOUS | Status: DC | PRN
  Administered 2021-01-18: 150 mg via INTRAVENOUS

## 2021-01-18 MED ORDER — KETOROLAC TROMETHAMINE 30 MG/ML IJ SOLN
INTRAMUSCULAR | Status: DC | PRN
  Administered 2021-01-18: 30 mg via INTRAVENOUS

## 2021-01-18 MED ORDER — PHENYLEPHRINE HCL (PRESSORS) 10 MG/ML IV SOLN
INTRAVENOUS | Status: DC | PRN
  Administered 2021-01-18: 50 ug via INTRAVENOUS

## 2021-01-18 MED ORDER — ONDANSETRON HCL 4 MG/2ML IJ SOLN
INTRAMUSCULAR | Status: DC | PRN
  Administered 2021-01-18 (×2): 4 mg via INTRAVENOUS

## 2021-01-18 MED ORDER — FENTANYL CITRATE (PF) 100 MCG/2ML IJ SOLN
INTRAMUSCULAR | Status: AC
  Administered 2021-01-18: 25 ug via INTRAVENOUS
  Filled 2021-01-18: qty 2

## 2021-01-18 MED ORDER — ONDANSETRON HCL 4 MG/2ML IJ SOLN
INTRAMUSCULAR | Status: AC
  Filled 2021-01-18: qty 4

## 2021-01-18 MED ORDER — SIMETHICONE 80 MG PO CHEW
80.0000 mg | CHEWABLE_TABLET | Freq: Four times a day (QID) | ORAL | Status: DC
  Administered 2021-01-18 – 2021-01-19 (×3): 80 mg via ORAL
  Filled 2021-01-18 (×3): qty 1

## 2021-01-18 MED ORDER — MONTELUKAST SODIUM 10 MG PO TABS
10.0000 mg | ORAL_TABLET | Freq: Every day | ORAL | Status: DC
  Administered 2021-01-18: 10 mg via ORAL
  Filled 2021-01-18: qty 1

## 2021-01-18 MED ORDER — OXYCODONE HCL 5 MG PO TABS
ORAL_TABLET | ORAL | Status: AC
  Filled 2021-01-18: qty 1

## 2021-01-18 MED ORDER — PANTOPRAZOLE SODIUM 40 MG PO TBEC: 40.0000 mg | DELAYED_RELEASE_TABLET | Freq: Every day | ORAL | Status: DC

## 2021-01-18 MED ORDER — BISACODYL 10 MG RE SUPP: 10.0000 mg | Freq: Every day | RECTAL | Status: DC | PRN

## 2021-01-18 MED ORDER — SUGAMMADEX SODIUM 200 MG/2ML IV SOLN
INTRAVENOUS | Status: DC | PRN
  Administered 2021-01-18: 200 mg via INTRAVENOUS

## 2021-01-18 MED ORDER — BUPIVACAINE LIPOSOME 1.3 % IJ SUSP
INTRAMUSCULAR | Status: AC
  Filled 2021-01-18: qty 20

## 2021-01-18 MED ORDER — PREGABALIN 50 MG PO CAPS
50.0000 mg | ORAL_CAPSULE | Freq: Three times a day (TID) | ORAL | Status: DC
  Administered 2021-01-18 – 2021-01-19 (×3): 50 mg via ORAL
  Filled 2021-01-18 (×3): qty 1

## 2021-01-18 MED ORDER — ONDANSETRON HCL 4 MG/2ML IJ SOLN: 4.0000 mg | Freq: Once | INTRAMUSCULAR | Status: DC | PRN

## 2021-01-18 MED ORDER — ATORVASTATIN CALCIUM 20 MG PO TABS
40.0000 mg | ORAL_TABLET | Freq: Every day | ORAL | Status: DC
  Administered 2021-01-18: 40 mg via ORAL
  Filled 2021-01-18: qty 2

## 2021-01-18 MED ORDER — OXYCODONE HCL 5 MG PO TABS
5.0000 mg | ORAL_TABLET | Freq: Four times a day (QID) | ORAL | Status: DC | PRN
  Administered 2021-01-18 – 2021-01-19 (×3): 5 mg via ORAL
  Filled 2021-01-18 (×2): qty 1

## 2021-01-18 MED ORDER — ACETAMINOPHEN 10 MG/ML IV SOLN
INTRAVENOUS | Status: DC | PRN
  Administered 2021-01-18: 1000 mg via INTRAVENOUS

## 2021-01-18 MED ORDER — FENTANYL CITRATE (PF) 100 MCG/2ML IJ SOLN
INTRAMUSCULAR | Status: DC | PRN
  Administered 2021-01-18 (×2): 50 ug via INTRAVENOUS

## 2021-01-18 MED ORDER — MEPERIDINE HCL 25 MG/ML IJ SOLN: 6.2500 mg | INTRAMUSCULAR | Status: DC | PRN

## 2021-01-18 MED ORDER — ACETAMINOPHEN 10 MG/ML IV SOLN
INTRAVENOUS | Status: AC
  Filled 2021-01-18: qty 100

## 2021-01-18 MED ORDER — BUPIVACAINE LIPOSOME 1.3 % IJ SUSP
INTRAMUSCULAR | Status: DC | PRN
  Administered 2021-01-18: 20 mL

## 2021-01-18 MED ORDER — FLUTICASONE PROPIONATE 50 MCG/ACT NA SUSP
2.0000 | Freq: Every day | NASAL | Status: DC
  Administered 2021-01-18 – 2021-01-19 (×2): 2 via NASAL
  Filled 2021-01-18: qty 16

## 2021-01-18 MED ORDER — MIDAZOLAM HCL 2 MG/2ML IJ SOLN
INTRAMUSCULAR | Status: AC
  Filled 2021-01-18: qty 2

## 2021-01-18 MED ORDER — CHLORHEXIDINE GLUCONATE 0.12 % MT SOLN
OROMUCOSAL | Status: AC
  Filled 2021-01-18: qty 15

## 2021-01-18 MED ORDER — ROCURONIUM BROMIDE 10 MG/ML (PF) SYRINGE
PREFILLED_SYRINGE | INTRAVENOUS | Status: AC
  Filled 2021-01-18: qty 20

## 2021-01-18 MED ORDER — LACTATED RINGERS IV SOLN: INTRAVENOUS | Status: DC

## 2021-01-18 MED ORDER — FENTANYL CITRATE (PF) 100 MCG/2ML IJ SOLN
INTRAMUSCULAR | Status: AC
  Filled 2021-01-18: qty 2

## 2021-01-18 MED ORDER — IBUPROFEN 600 MG PO TABS
600.0000 mg | ORAL_TABLET | Freq: Four times a day (QID) | ORAL | Status: DC
  Administered 2021-01-18 – 2021-01-19 (×4): 600 mg via ORAL
  Filled 2021-01-18 (×5): qty 1

## 2021-01-18 MED ORDER — LIDOCAINE HCL (CARDIAC) PF 100 MG/5ML IV SOSY
PREFILLED_SYRINGE | INTRAVENOUS | Status: DC | PRN
  Administered 2021-01-18: 80 mg via INTRAVENOUS

## 2021-01-18 MED ORDER — PROPOFOL 10 MG/ML IV BOLUS
INTRAVENOUS | Status: AC
  Filled 2021-01-18: qty 20

## 2021-01-18 MED ORDER — DEXAMETHASONE SODIUM PHOSPHATE 10 MG/ML IJ SOLN
INTRAMUSCULAR | Status: DC | PRN
  Administered 2021-01-18: 5 mg via INTRAVENOUS

## 2021-01-18 MED ORDER — ROCURONIUM BROMIDE 100 MG/10ML IV SOLN
INTRAVENOUS | Status: DC | PRN
  Administered 2021-01-18 (×2): 20 mg via INTRAVENOUS
  Administered 2021-01-18: 50 mg via INTRAVENOUS

## 2021-01-18 MED ORDER — KETOROLAC TROMETHAMINE 30 MG/ML IJ SOLN
INTRAMUSCULAR | Status: AC
  Filled 2021-01-18: qty 1

## 2021-01-18 MED ORDER — POLYETHYLENE GLYCOL 3350 17 G PO PACK
17.0000 g | PACK | Freq: Every day | ORAL | Status: DC | PRN
  Filled 2021-01-18: qty 1

## 2021-01-18 MED ORDER — MIDAZOLAM HCL 2 MG/2ML IJ SOLN
INTRAMUSCULAR | Status: DC | PRN
  Administered 2021-01-18: 2 mg via INTRAVENOUS

## 2021-01-18 MED ORDER — LEVOCETIRIZINE DIHYDROCHLORIDE 5 MG PO TABS: 5.0000 mg | ORAL_TABLET | Freq: Every evening | ORAL | Status: DC

## 2021-01-18 MED ORDER — SUMATRIPTAN SUCCINATE 50 MG PO TABS
100.0000 mg | ORAL_TABLET | ORAL | Status: DC | PRN
  Filled 2021-01-18: qty 2

## 2021-01-18 MED ORDER — ACETAMINOPHEN 500 MG PO TABS: 1000.0000 mg | ORAL_TABLET | Freq: Four times a day (QID) | ORAL | Status: DC | PRN

## 2021-01-18 MED ORDER — DOCUSATE SODIUM 100 MG PO CAPS
100.0000 mg | ORAL_CAPSULE | Freq: Two times a day (BID) | ORAL | Status: DC
  Administered 2021-01-18 – 2021-01-19 (×2): 100 mg via ORAL
  Filled 2021-01-18 (×2): qty 1

## 2021-01-18 MED ORDER — LORATADINE 10 MG PO TABS
10.0000 mg | ORAL_TABLET | Freq: Every evening | ORAL | Status: DC
  Filled 2021-01-18: qty 1

## 2021-01-18 MED ORDER — METFORMIN HCL 500 MG PO TABS
500.0000 mg | ORAL_TABLET | Freq: Two times a day (BID) | ORAL | Status: DC
  Administered 2021-01-18 – 2021-01-19 (×2): 500 mg via ORAL
  Filled 2021-01-18 (×2): qty 1

## 2021-01-18 MED ORDER — ATENOLOL 50 MG PO TABS
50.0000 mg | ORAL_TABLET | Freq: Every day | ORAL | Status: DC
  Administered 2021-01-19: 50 mg via ORAL
  Filled 2021-01-18: qty 1

## 2021-01-18 MED ORDER — MORPHINE SULFATE (PF) 2 MG/ML IV SOLN
1.0000 mg | INTRAVENOUS | Status: DC | PRN
Start: 2021-01-18 — End: 2021-01-19

## 2021-01-18 MED ORDER — DEXAMETHASONE SODIUM PHOSPHATE 10 MG/ML IJ SOLN
INTRAMUSCULAR | Status: AC
  Filled 2021-01-18: qty 2

## 2021-01-18 MED ORDER — FENTANYL CITRATE (PF) 100 MCG/2ML IJ SOLN
25.0000 ug | INTRAMUSCULAR | Status: DC | PRN
  Administered 2021-01-18: 50 ug via INTRAVENOUS
  Administered 2021-01-18: 25 ug via INTRAVENOUS

## 2021-01-18 SURGICAL SUPPLY — 69 items
ADH SKN CLS APL DERMABOND .7 (GAUZE/BANDAGES/DRESSINGS) ×1
APL PRP STRL LF DISP 70% ISPRP (MISCELLANEOUS) ×1
BAG DRN RND TRDRP ANRFLXCHMBR (UROLOGICAL SUPPLIES) ×1
BAG URINE DRAIN 2000ML AR STRL (UROLOGICAL SUPPLIES) ×2 IMPLANT
BASIN GRAD PLASTIC 32OZ STRL (MISCELLANEOUS) ×2 IMPLANT
BINDER ABDOMINAL 12 ML 46-62 (SOFTGOODS) ×1 IMPLANT
BLADE SURG SZ10 CARB STEEL (BLADE) ×2 IMPLANT
BLADE SURG SZ11 CARB STEEL (BLADE) ×2 IMPLANT
CANNULA REDUC XI 12-8 STAPL (CANNULA) ×2
CANNULA REDUCER 12-8 DVNC XI (CANNULA) ×1 IMPLANT
CATH FOLEY 2WAY  5CC 16FR (CATHETERS) ×2
CATH FOLEY 2WAY 5CC 16FR (CATHETERS) ×1
CATH URTH 16FR FL 2W BLN LF (CATHETERS) ×1 IMPLANT
CHLORAPREP W/TINT 26 (MISCELLANEOUS) ×2 IMPLANT
COVER TIP SHEARS 8 DVNC (MISCELLANEOUS) ×1 IMPLANT
COVER TIP SHEARS 8MM DA VINCI (MISCELLANEOUS) ×2
COVER WAND RF STERILE (DRAPES) ×3 IMPLANT
DEFOGGER SCOPE WARMER CLEARIFY (MISCELLANEOUS) ×2 IMPLANT
DERMABOND ADVANCED (GAUZE/BANDAGES/DRESSINGS) ×1
DERMABOND ADVANCED .7 DNX12 (GAUZE/BANDAGES/DRESSINGS) ×1 IMPLANT
DRAPE ARM DVNC X/XI (DISPOSABLE) ×3 IMPLANT
DRAPE COLUMN DVNC XI (DISPOSABLE) ×1 IMPLANT
DRAPE DA VINCI XI ARM (DISPOSABLE) ×6
DRAPE DA VINCI XI COLUMN (DISPOSABLE) ×2
DRAPE UNDER BUTTOCK W/FLU (DRAPES) ×2 IMPLANT
DRSG TEGADERM 2-3/8X2-3/4 SM (GAUZE/BANDAGES/DRESSINGS) ×8 IMPLANT
ELECT CAUTERY BLADE 6.4 (BLADE) ×1 IMPLANT
ELECT REM PT RETURN 9FT ADLT (ELECTROSURGICAL) ×2
ELECTRODE REM PT RTRN 9FT ADLT (ELECTROSURGICAL) ×1 IMPLANT
GAUZE 4X4 16PLY RFD (DISPOSABLE) ×2 IMPLANT
GELPOINT ADV PLATFORM (ENDOMECHANICALS) ×2
GLOVE SURG ENC MOIS LTX SZ7 (GLOVE) ×4 IMPLANT
GLOVE SURG UNDER POLY LF SZ7.5 (GLOVE) ×4 IMPLANT
GOWN STRL REUS W/ TWL LRG LVL3 (GOWN DISPOSABLE) ×8 IMPLANT
GOWN STRL REUS W/TWL LRG LVL3 (GOWN DISPOSABLE) ×12
GRASPER SUT TROCAR 14GX15 (MISCELLANEOUS) ×2 IMPLANT
KIT PINK PAD W/HEAD ARE REST (MISCELLANEOUS) ×2
KIT PINK PAD W/HEAD ARM REST (MISCELLANEOUS) ×1 IMPLANT
LABEL OR SOLS (LABEL) ×2 IMPLANT
MANIFOLD NEPTUNE II (INSTRUMENTS) ×2 IMPLANT
MANIPULATOR VCARE SML CRV RETR (MISCELLANEOUS) ×1 IMPLANT
NEEDLE HYPO 22GX1.5 SAFETY (NEEDLE) ×2 IMPLANT
NS IRRIG 1000ML POUR BTL (IV SOLUTION) ×3 IMPLANT
OBTURATOR OPTICAL STANDARD 8MM (TROCAR) ×2
OBTURATOR OPTICAL STND 8 DVNC (TROCAR) ×1
OBTURATOR OPTICALSTD 8 DVNC (TROCAR) ×1 IMPLANT
OCCLUDER COLPOPNEUMO (BALLOONS) ×2 IMPLANT
PACK GYN LAPAROSCOPIC (MISCELLANEOUS) ×2 IMPLANT
PAD ARMBOARD 7.5X6 YLW CONV (MISCELLANEOUS) ×2 IMPLANT
PAD OB MATERNITY 4.3X12.25 (PERSONAL CARE ITEMS) ×2 IMPLANT
PAD PREP 24X41 OB/GYN DISP (PERSONAL CARE ITEMS) ×2 IMPLANT
PENCIL ELECTRO HAND CTR (MISCELLANEOUS) ×1 IMPLANT
PLATFORM STD W/COL CELL SVR (ENDOMECHANICALS) IMPLANT
SEAL CANN UNIV 5-8 DVNC XI (MISCELLANEOUS) ×3 IMPLANT
SEAL XI 5MM-8MM UNIVERSAL (MISCELLANEOUS) ×8
SET TUBE SMOKE EVAC HIGH FLOW (TUBING) ×1 IMPLANT
SOLUTION ELECTROLUBE (MISCELLANEOUS) ×2 IMPLANT
SPONGE GAUZE 2X2 8PLY STRL LF (GAUZE/BANDAGES/DRESSINGS) ×8 IMPLANT
SPONGE LAP 18X18 RF (DISPOSABLE) ×2 IMPLANT
STAPLER CANNULA SEAL DVNC XI (STAPLE) ×1 IMPLANT
STAPLER CANNULA SEAL XI (STAPLE) ×2
SURGILUBE 2OZ TUBE FLIPTOP (MISCELLANEOUS) ×2 IMPLANT
SUT MNCRL 4-0 (SUTURE) ×2
SUT MNCRL 4-0 27XMFL (SUTURE) ×1
SUT VIC AB 0 CT1 36 (SUTURE) ×2 IMPLANT
SUT VICRYL 0 AB UR-6 (SUTURE) ×1 IMPLANT
SUTURE MNCRL 4-0 27XMF (SUTURE) ×1 IMPLANT
SYR 10ML LL (SYRINGE) ×3 IMPLANT
SYR 50ML LL SCALE MARK (SYRINGE) ×2 IMPLANT

## 2021-01-18 NOTE — Transfer of Care (Signed)
Immediate Anesthesia Transfer of Care Note  Patient: [name]  Procedure(s) Performed: XI ROBOTIC ASSISTED RIGHT SALPINGO-OOPHORECTOMY (Right )  Patient Location: PACU  Anesthesia Type:General  Level of Consciousness: awake, drowsy and patient cooperative  Airway & Oxygen Therapy: Patient Spontanous Breathing and Patient connected to face mask oxygen  Post-op Assessment: Report given to RN and Post -op Vital signs reviewed and stable  Post vital signs: Reviewed and stable  Last Vitals:  Vitals Value Taken Time  BP 137/62 01/18/21 1123  Temp    Pulse 71 01/18/21 1126  Resp 21 01/18/21 1126  SpO2 100 % 01/18/21 1126  Vitals shown include unvalidated device data.  Last Pain: There were no vitals filed for this visit.       Complications: No complications documented.

## 2021-01-18 NOTE — Anesthesia Postprocedure Evaluation (Signed)
Anesthesia Post Note  Patient: [name]  Procedure(s) Performed: XI ROBOTIC ASSISTED RIGHT SALPINGO-OOPHORECTOMY (Right )  Patient location during evaluation: PACU Anesthesia Type: General Level of consciousness: awake and alert, awake and oriented Pain management: pain level controlled Vital Signs Assessment: post-procedure vital signs reviewed and stable Respiratory status: spontaneous breathing, nonlabored ventilation and respiratory function stable Cardiovascular status: blood pressure returned to baseline and stable Postop Assessment: no apparent nausea or vomiting Anesthetic complications: no   No complications documented.   Last Vitals:  Vitals:   01/18/21 1245 01/18/21 1300  BP: (!) 128/57 138/72  Pulse: 71 69  Resp: 12 (!) 23  Temp: 36.8 C 36.7 C  SpO2: 99% 98%    Last Pain:  Vitals:   01/18/21 1300  PainSc: 5                  Phill Mutter

## 2021-01-18 NOTE — Anesthesia Procedure Notes (Signed)
Procedure Name: Intubation Date/Time: 01/18/2021 9:14 AM Performed by: Jerrye Noble, CRNA Pre-anesthesia Checklist: Patient identified, Emergency Drugs available, Suction available and Patient being monitored Patient Re-evaluated:Patient Re-evaluated prior to induction Oxygen Delivery Method: Circle system utilized Preoxygenation: Pre-oxygenation with 100% oxygen Induction Type: IV induction Ventilation: Mask ventilation without difficulty Laryngoscope Size: McGraph and 3 Grade View: Grade I Tube type: Oral Tube size: 6.5 mm Number of attempts: 1 Airway Equipment and Method: Stylet,  Oral airway and Video-laryngoscopy Placement Confirmation: ETT inserted through vocal cords under direct vision,  positive ETCO2 and breath sounds checked- equal and bilateral Secured at: 22 cm Tube secured with: Tape Dental Injury: Teeth and Oropharynx as per pre-operative assessment

## 2021-01-18 NOTE — Interval H&P Note (Signed)
History and Physical Interval Note:  01/18/2021 8:39 AM  [name]  has presented today for surgery, with the diagnosis of right ovarian cyst.  The various methods of treatment have been discussed with the patient and family. After consideration of risks, benefits and other options for treatment, the patient has consented to  Procedure(s): XI ROBOTIC ASSISTED RIGHT OOPHORECTOMY (Right) as a surgical intervention.  The patient's history has been reviewed, patient examined, no change in status, stable for surgery.  I have reviewed the patient's chart and labs.  Questions were answered to the patient's satisfaction.     [place]

## 2021-01-18 NOTE — Progress Notes (Signed)
Pt had ginger ale and graham crackers and tolerated well. Advanced diet and pt ate grilled chicken, chicken noodle soup, and a roll. Tolerated well.   Saline locked due to tolerating PO fluids.   Ambulated to the restroom. Voided 323mL. No dizziness. Pt changed into personal gown. Scant vaginal bleeding.   4 port sites WDL. No drainage on dressing.   Plans to rest and walk again in a few hours.   Pt has only requested motrin for pain control. Nothing stronger for now.

## 2021-01-18 NOTE — Anesthesia Preprocedure Evaluation (Addendum)
Anesthesia Evaluation  Patient identified by MRN, date of birth, ID band Patient awake    Reviewed: Allergy & Precautions, NPO status , Patient's Chart, lab work & pertinent test results, reviewed documented beta blocker date and time   Airway Mallampati: III  TM Distance: >3 FB Neck ROM: Full    Dental no notable dental hx.    Pulmonary neg pulmonary ROS, former smoker,    Pulmonary exam normal        Cardiovascular hypertension, Pt. on medications and Pt. on home beta blockers Normal cardiovascular exam+ dysrhythmias + pacemaker      Neuro/Psych  Headaches,  Neuromuscular disease negative psych ROS   GI/Hepatic Neg liver ROS, GERD  Medicated and Controlled,  Endo/Other  diabetes, Well Controlled, Type 2Hypothyroidism   Renal/GU negative Renal ROS  negative genitourinary   Musculoskeletal  (+) Arthritis , Osteoarthritis,    Abdominal   Peds negative pediatric ROS (+)  Hematology negative hematology ROS (+)   Anesthesia Other Findings Breast CA (HCC)  Chemo/XRT/lumpectomy  Breast cancer ([place]) 09/2007 left breast, radiation, chemo  Cardiomyopathy secondary    Hypersomnolence 09/21/2013   Leg edema, left 09/21/2013   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  Ovarian cyst 11/06/2020   Pacemaker [employer]    Sinus tachycardia    Type 2 diabetes mellitus (HCC)       Reproductive/Obstetrics negative OB ROS                            Anesthesia Physical Anesthesia Plan  ASA: III  Anesthesia Plan: General   Post-op Pain Management:    Induction: Intravenous  PONV Risk Score and Plan: 2 and Propofol infusion, Ondansetron and Midazolam  Airway Management Planned: Oral ETT  Additional Equipment:   Intra-op Plan:   Post-operative Plan: Extubation in OR  Informed Consent: I have reviewed the patients History and Physical, chart, labs and discussed the procedure  including the risks, benefits and alternatives for the proposed anesthesia with the patient or authorized representative who has indicated his/her understanding and acceptance.       Plan Discussed with: CRNA, Anesthesiologist and Surgeon  Anesthesia Plan Comments:         Anesthesia Quick Evaluation

## 2021-01-18 NOTE — Op Note (Signed)
  Operative Note   PRE-OP DIAGNOSIS: Right ovarian dermoid   POST-OP DIAGNOSIS:Right ovarian dermoid  SURGEON: Adrian Prows MD  ASSISTANT: Malachy Mood MD  ANESTHESIA: General  PROCEDURE: Procedure(s):Robotic assisted right salpingo-oophorectomy  ESTIMATED BLOOD LOSS: 10 cc  DRAINS: Foley  SPECIMENS: Right fallopian tube and ovary  COMPLICATIONS: None  DISPOSITION: PACU  CONDITION: Stable  INDICATIONS: Right ovarian dermoid  FINDINGS: Exam under anesthesia revealed an 8 week uterus. There was a right ovarian cyst. The parametria was smooth. The cervix was negative for gross lesions. Intraoperative findings included: The uterus was grossly normal. The left adnexa was normal. The right ovary was enlarged with an ovarian cyst. Omental scarring limited view of the liver and upper abdomen. The appendix was normal. There was no evidence of grossly enlarged pelvic or right para-aortic lymph nodes.   PROCEDURE IN DETAIL: After informed consent was obtained, the patient was taken to the operating room where anesthesia was obtained without difficulty. The patient was positioned in the dorsal lithotomy position in [place] and her arms were carefully tucked at her sides and the usual precautions were taken.  She was prepped and draped in normal sterile fashion.  Time-out was performed. A foley catheter was placed. A speculum was placed in the vagina and the cervical os was dilator. The uterus sounded to 8 cm.  A standard VCare uterine manipulator was then placed in the uterus without incident.    Laparoscopic entry was obtained via a supraumbilical incision and direct entry. The 44mm robotic optiview port was placed, abdomen insuffulated, and pelvis visualized with noted findings above.  The patient was placed in Trendelenburg and the bowel was displaced up into the upper abdomen.  The 3 additional robotic port were placed in a horizontal line across the upper abdomen. The  umbilical port was removed. The umbilical incision was extended. A gelpoint was placed in the umbilicus and a robotic trocar was placed through the gelpoint. Robotic docking was performed.  The right infundibulopelvic ligament were identified. The bipolar was used to coagulate and cut the right infundibulopelvic ligament and the right uteroovarian ligament to remove the right fallopian tube and ovary. The right ovary and fallopian tube were placed into a surgical bag. The surgical instruments were removed. The robot was then undocked. The surgical bag was removed through the gelpoint.    The fascial incision of the umbilical port was closed with 0 Vicryl suture using running technique.   The skin incision at the umbilicus was closed with subcuticular stitch using 4-0 vicryl.  The remaining skin incisions were closed with subcuticular stitch and Indermil glue.  The patient tolerated the procedure well.  Sponge, lap and needle counts were correct x2.  The patient was taken to recovery room in excellent condition.  The uterine manipulator and the foley catheter were removed.     Dr. Georgianne Fick assisted with this case. This was a high level case requiring a Physicist, medical. No other assistant was readily available. He assisted with abdominal entry, port placement, and uterine manipulation during the case.    Adrian Prows MD, [facility], [facility] 01/18/2021 11:47 AM

## 2021-01-19 DIAGNOSIS — N83291 Other ovarian cyst, right side: Secondary | ICD-10-CM | POA: Diagnosis not present

## 2021-01-19 DIAGNOSIS — Z09 Encounter for follow-up examination after completed treatment for conditions other than malignant neoplasm: Secondary | ICD-10-CM

## 2021-01-19 LAB — CBC
HCT: 31 % — ABNORMAL LOW (ref 36.0–46.0)
Hemoglobin: 10.4 g/dL — ABNORMAL LOW (ref 12.0–15.0)
MCH: 29.5 pg (ref 26.0–34.0)
MCHC: 33.5 g/dL (ref 30.0–36.0)
MCV: 88.1 fL (ref 80.0–100.0)
Platelets: 169 10*3/uL (ref 150–400)
RBC: 3.52 MIL/uL — ABNORMAL LOW (ref 3.87–5.11)
RDW: 12.9 % (ref 11.5–15.5)
WBC: 10.1 10*3/uL (ref 4.0–10.5)
nRBC: 0 % (ref 0.0–0.2)

## 2021-01-19 LAB — SURGICAL PATHOLOGY

## 2021-01-19 MED ORDER — DOCUSATE SODIUM 100 MG PO CAPS: 100.0000 mg | ORAL_CAPSULE | Freq: Two times a day (BID) | ORAL | 0 refills | Status: DC

## 2021-01-19 MED ORDER — ACETAMINOPHEN 500 MG PO TABS: 1000.0000 mg | ORAL_TABLET | Freq: Four times a day (QID) | ORAL | 0 refills | Status: DC | PRN

## 2021-01-19 MED ORDER — IBUPROFEN 600 MG PO TABS: 600.0000 mg | ORAL_TABLET | Freq: Four times a day (QID) | ORAL | 0 refills | Status: DC

## 2021-01-19 MED ORDER — OXYCODONE HCL 5 MG PO TABS: 5.0000 mg | ORAL_TABLET | Freq: Four times a day (QID) | ORAL | 0 refills | Status: DC | PRN

## 2021-01-19 NOTE — Discharge Summary (Signed)
Physician Discharge Summary  Patient ID: [name] MRN: [id number] DOB/AGE: 71-Mar-1952 71 y.o.  Admit date: 01/18/2021 Discharge date: 01/19/2021  Admission Diagnoses: S/p right oophorectomy Discharge Diagnoses:  Active Problems:   S/P right oophorectomy   Discharged Condition: good  Hospital Course: She was admitted for postoperative care. She had a normal postoperative course. Her pain was controlled with oral medicine. She tolerated a regular diet. She was able to be discharged home in stable condition.   Consults: None  Significant Diagnostic Studies: None  Treatments: surgery: Right salpingo-oophorectomy  Discharge Exam: Blood pressure 110/65, pulse 73, temperature 97.9 F (36.6 C), temperature source Oral, resp. rate 17, SpO2 99 %. General appearance: alert and cooperative Resp: clear to auscultation bilaterally Chest wall: no tenderness Cardio: regular rate and rhythm, S1, S2 normal, no murmur, click, rub or gallop Extremities: extremities normal, atraumatic, no cyanosis or edema  Disposition: Discharge disposition: 01-Home or Self Care       Discharge Instructions    Call MD for:  difficulty breathing, headache or visual disturbances   Complete by: As directed    Call MD for:  extreme fatigue   Complete by: As directed    Call MD for:  hives   Complete by: As directed    Call MD for:  persistant dizziness or light-headedness   Complete by: As directed    Call MD for:  persistant nausea and vomiting   Complete by: As directed    Call MD for:  redness, tenderness, or signs of infection (pain, swelling, redness, odor or green/yellow discharge around incision site)   Complete by: As directed    Call MD for:  severe uncontrolled pain   Complete by: As directed    Call MD for:  temperature >100.4   Complete by: As directed    Diet general   Complete by: As directed    Driving Restrictions   Complete by: As directed    Do not drive while taking narcotic  medications   Increase activity slowly   Complete by: As directed    May shower / Bathe   Complete by: As directed    Remove dressing in 24 hours   Complete by: As directed      Allergies as of 01/19/2021      Reactions   Penicillins Swelling   Causes organs to swell Other reaction(s): Other (See Comments) Organs flipped over per patient; skin starting splitting open and blood vessels burst in legs;  Causes organs to swell   Levofloxacin Swelling   Leg swelling and muscle weakness Other reaction(s): Other (See Comments) Tendon pain and weakness in muscles Leg swelling and muscle weakness   Other Itching   bandaids      Medication List    STOP taking these medications   meloxicam 15 MG tablet Commonly known as: MOBIC     TAKE these medications   acetaminophen 500 MG tablet Commonly known as: TYLENOL Take 2 tablets (1,000 mg total) by mouth every 6 (six) hours as needed for mild pain.   aMILoride 5 MG tablet Commonly known as: MIDAMOR Take 1 tablet (5 mg total) by mouth daily. What changed: when to take this   aspirin-acetaminophen-caffeine 250-250-65 MG tablet Commonly known as: EXCEDRIN MIGRAINE Take 2 tablets by mouth every 6 (six) hours as needed for headache or migraine.   atenolol 50 MG tablet Commonly known as: TENORMIN Take 1 tablet (50 mg total) by mouth daily. What changed: when to take this  atorvastatin 40 MG tablet Commonly known as: LIPITOR Take 1 tablet (40 mg total) by mouth daily. What changed: when to take this   azelastine 0.1 % nasal spray Commonly known as: ASTELIN Place 1 spray into both nostrils 2 (two) times daily. Use in each nostril as directed   chlorpheniramine-HYDROcodone 10-8 MG/5ML Suer Commonly known as: Tussionex Pennkinetic ER Take 5 mLs by mouth every 12 (twelve) hours as needed for cough.   CINNAMON PO Take 3 capsules by mouth in the morning.   CRANBERRY-VITAMIN C PO Take 1 tablet by mouth daily.   docusate sodium  100 MG capsule Commonly known as: COLACE Take 1 capsule (100 mg total) by mouth 2 (two) times daily.   esomeprazole 20 MG capsule Commonly known as: NEXIUM Take 20 mg by mouth every morning.   fluticasone 50 MCG/ACT nasal spray Commonly known as: Flonase Place 2 sprays into both nostrils daily.   glucose blood test strip Commonly known as: Accu-Chek Aviva Plus TEST BLOOD SUGAR ONCE DAILY   ibuprofen 600 MG tablet Commonly known as: ADVIL Take 1 tablet (600 mg total) by mouth every 6 (six) hours.   levocetirizine 5 MG tablet Commonly known as: XYZAL Take 5 mg by mouth every evening.   MAGNESIUM CHLORIDE-CALCIUM PO Take 3 tablets by mouth daily.   magnesium oxide 400 MG tablet Commonly known as: MAG-OX Take 1,200 mg by mouth daily.   melatonin 3 MG Tabs tablet Take 3 mg by mouth at bedtime.   metFORMIN 500 MG tablet Commonly known as: GLUCOPHAGE Take 1 tablet (500 mg total) by mouth 2 (two) times daily with a meal.   montelukast 10 MG tablet Commonly known as: SINGULAIR Take 1 tablet (10 mg total) by mouth at bedtime.   nystatin powder Commonly known as: MYCOSTATIN/NYSTOP Apply 1 application topically 2 (two) times daily.   oxyCODONE 5 MG immediate release tablet Commonly known as: Oxy IR/ROXICODONE Take 1 tablet (5 mg total) by mouth every 6 (six) hours as needed for severe pain.   pregabalin 50 MG capsule Commonly known as: LYRICA Take 1 capsule (50 mg total) by mouth 3 (three) times daily.   SUMAtriptan 100 MG tablet Commonly known as: Imitrex Take 1 tablet (100 mg total) by mouth as needed.   VITAMIN B-12 PO Take 1 tablet by mouth every other day.   Vitamin D3 50 MCG (2000 UT) Tabs Take 1 tablet by mouth daily.       Follow-up Information    Johann Santone, Stefanie Libel, MD. Schedule an appointment as soon as possible for a visit in 1 week.   Specialty: Obstetrics and Gynecology Why: For incision check Contact information: [address]. [place] [address] [phone]               Signed: CHIZARAM LATINO 01/19/2021, 9:01 AM

## 2021-01-19 NOTE — Progress Notes (Signed)
Patient discharged home. Son present at discharge. Discharge instructions and prescriptions given and reviewed with patient. Patient verbalized understanding.  Follow-up appointment scheduled for Thursday, June 2nd at 2pm with Dr. Gilman Schmidt at [facility]!   Went over care instructions for port sites with patient. Abdominal binder provided.   Escorted out by volunteers.

## 2021-01-19 NOTE — Discharge Instructions (Signed)
Unilateral Salpingo-Oophorectomy, Care After This sheet gives you information about how to care for yourself after your procedure. Your health care provider may also give you more specific instructions. If you have problems or questions, contact your health care provider. What can I expect after the procedure? After the procedure, it is common to have:  Abdominal pain.  Some occasional vaginal bleeding (spotting).  Tiredness.  Symptoms of menopause, such as hot flashes, night sweats, or mood swings. Follow these instructions at home: Incision care  Keep your incision area and your bandage (dressing) clean and dry.  Follow instructions from your health care provider about how to take care of your incision. Make sure you: ? Wash your hands with soap and water before you change your dressing. If soap and water are not available, use hand sanitizer. ? Change your dressing as told by your health care provider. ? Leave stitches (sutures), staples, skin glue, or adhesive strips in place. These skin closures may need to stay in place for 2 weeks or longer. If adhesive strip edges start to loosen and curl up, you may trim the loose edges. Do not remove adhesive strips completely unless your health care provider tells you to do that.  Check your incision area every day for signs of infection. Check for: ? Redness, swelling, or pain. ? Fluid or blood. ? Warmth. ? Pus or a bad smell.   Activity  Do not drive or use heavy machinery while taking prescription pain medicine.  Do not drive for 24 hours if you received a medicine to help you relax (sedative) during your procedure.  Take frequent, short walks throughout the day. Rest when you get tired. Ask your health care provider what activities are safe for you.  Avoid activity that requires great effort. Also, avoid heavy lifting. Do not lift anything that is heavier than 10 lbs. (4.5 kg), or the limit that your health care provider tells you,  until he or she says that it is safe to do so.  Do not douche, use tampons, or have sex until your health care provider approves.   General instructions  To prevent or treat constipation while you are taking prescription pain medicine, your health care provider may recommend that you: ? Drink enough fluid to keep your urine clear or pale yellow. ? Take over-the-counter or prescription medicines. ? Eat foods that are high in fiber, such as fresh fruits and vegetables, whole grains, and beans. ? Limit foods that are high in fat and processed sugars, such as fried and sweet foods.  Take over-the-counter and prescription medicines only as told by your health care provider.  Do not take baths, swim, or use a hot tub until your health care provider approves. Ask your health care provider if you can take showers. You may only be allowed to take sponge baths for bathing.  Wear compression stockings as told by your health care provider. These stockings help to prevent blood clots and reduce swelling in your legs.  Keep all follow-up visits as told by your health care provider. This is important.   Contact a health care provider if:  You have pain when you urinate.  You have pus or a bad smelling discharge coming from your vagina.  You have redness, swelling, or pain around your incision.  You have fluid or blood coming from your incision.  Your incision feels warm to the touch.  You have pus or a bad smell coming from your incision.  You have  a fever.  Your incision starts to break open.  You have pain in the abdomen, and it gets worse or does not get better when you take medicine.  You develop a rash.  You develop nausea and vomiting.  You feel lightheaded. Get help right away if:  You develop pain in your chest or leg.  You become short of breath.  You faint.  You have increased bleeding from your vagina. Summary  After the procedure, it is common to have pain, bleeding  in the vagina, and symptoms of menopause.  Follow instructions from your health care provider about how to take care of your incision.  Follow instructions from your health care provider about activities and restrictions.  Check your incision every day for signs of infection and report any symptoms to your health care provider. This information is not intended to replace advice given to you by your health care provider. Make sure you discuss any questions you have with your health care provider. Document Revised: 10/16/2018 Document Reviewed: 09/16/2016 Elsevier Patient Education  2021 [employer].

## 2021-01-19 NOTE — Progress Notes (Signed)
Patient ID: [name], female   DOB: [birth year], 70 y.o.   MRN: [id number]   Subjective:   She is feeling well. She has been able to ambulate. Her pain is controlled. She is passing flatus. She is tolerating a regular diet. She feels ready for discharge home   Objective:  Blood pressure 110/65, pulse 73, temperature 97.9 F (36.6 C), temperature source Oral, resp. rate 17, SpO2 99 %.  General: NAD Pulmonary: no increased work of breathing Abdomen: non-distended, non-tender, incision are clean, dry and intact. No erythema. No drainage Incision: healing well. Extremities: no edema, no erythema, no tenderness  Results for orders placed or performed during the hospital encounter of 01/18/21 (from the past 72 hour(s))  Glucose, capillary     Status: Abnormal   Collection Time: 01/18/21  8:07 AM  Result Value Ref Range   Glucose-Capillary 124 (H) 70 - 99 mg/dL    Comment: Glucose reference range applies only to samples taken after fasting for at least 8 hours.  ABO/Rh     Status: None   Collection Time: 01/18/21  8:14 AM  Result Value Ref Range   ABO/RH(D)      O NEG Performed at [facility], [place], [place] [address]   Glucose, capillary     Status: Abnormal   Collection Time: 01/18/21 11:26 AM  Result Value Ref Range   Glucose-Capillary 148 (H) 70 - 99 mg/dL    Comment: Glucose reference range applies only to samples taken after fasting for at least 8 hours.  CBC     Status: Abnormal   Collection Time: 01/19/21  4:36 AM  Result Value Ref Range   WBC 10.1 4.0 - 10.5 K/uL   RBC 3.52 (L) 3.87 - 5.11 MIL/uL   Hemoglobin 10.4 (L) 12.0 - 15.0 g/dL   HCT 31.0 (L) 36.0 - 46.0 %   MCV 88.1 80.0 - 100.0 fL   MCH 29.5 26.0 - 34.0 pg   MCHC 33.5 30.0 - 36.0 g/dL   RDW 12.9 11.5 - 15.5 %   Platelets 169 150 - 400 K/uL   nRBC 0.0 0.0 - 0.2 %    Comment: Performed at [facility], [address] [place], [place] [address]     Assessment:   70  y.o.  S/p right salpingo-oophorectomy, post op day #1   Plan:  1)Continue PO pain medication 2) Continue with ambulation 3) Regular diet  4) Discharge home today with follow up next week in the office.   Adrian Prows MD, [facility], [facility] 01/19/2021 8:53 AM

## 2021-01-25 ENCOUNTER — Ambulatory Visit (INDEPENDENT_AMBULATORY_CARE_PROVIDER_SITE_OTHER): Payer: Medicare PPO | Admitting: Obstetrics and Gynecology

## 2021-01-25 ENCOUNTER — Encounter: Payer: Self-pay | Admitting: Obstetrics and Gynecology

## 2021-01-25 ENCOUNTER — Other Ambulatory Visit: Payer: Self-pay

## 2021-01-25 VITALS — BP 124/70 | Ht 63.5 in | Wt 228.6 lb

## 2021-01-25 DIAGNOSIS — Z90721 Acquired absence of ovaries, unilateral: Secondary | ICD-10-CM

## 2021-01-25 NOTE — Progress Notes (Signed)
  Postoperative Follow-up Patient presents post op from  [facility] Robot-assisted Laparoscopic Right Salpingo-oophorectomy  for  Right ovarian dermoid, 1 week ago.  Subjective: Patient reports some improvement in her preop symptoms. Eating a regular diet without difficulty. Pain is controlled without any medications.  Activity: sedentary. Patient reports additional symptom's since surgery of None.  Objective: BP 124/70   Ht 5' 3.5" (1.613 m)   Wt 228 lb 9.6 oz (103.7 kg)   BMI 39.86 kg/m  Physical Exam Constitutional:      Appearance: Normal appearance. She is well-developed.  HENT:     Head: Normocephalic and atraumatic.  Eyes:     Extraocular Movements: Extraocular movements intact.     Pupils: Pupils are equal, round, and reactive to light.  Neck:     Thyroid: No thyromegaly.  Cardiovascular:     Rate and Rhythm: Normal rate and regular rhythm.     Heart sounds: Normal heart sounds.  Pulmonary:     Effort: Pulmonary effort is normal.     Breath sounds: Normal breath sounds.  Abdominal:     General: Bowel sounds are normal. There is no distension.     Palpations: Abdomen is soft. There is no mass.     Comments: Incisions are clean, dry and intact  Musculoskeletal:     Cervical back: Neck supple.  Neurological:     Mental Status: She is alert and oriented to person, place, and time.  Skin:    General: Skin is warm and dry.  Psychiatric:        Behavior: Behavior normal.        Thought Content: Thought content normal.        Judgment: Judgment normal.  Vitals reviewed.    Assessment: s/p :  [facility] Robot-assisted Laparoscopic Right Salpingo-oophorectomy-  stable  Plan: Patient has done well after surgery with no apparent complications.  I have discussed the post-operative course to date, and the expected progress moving forward.  The patient understands what complications to be concerned about.  I will see the patient in routine follow up, or sooner if needed.    Activity plan:  No heavy lifting.  Pelvic rest.  Adrian Prows MD, [place], [facility] 01/25/2021 1:57 PM

## 2021-02-07 ENCOUNTER — Other Ambulatory Visit: Payer: Self-pay

## 2021-02-07 ENCOUNTER — Ambulatory Visit: Payer: Medicare PPO | Admitting: Nurse Practitioner

## 2021-02-07 ENCOUNTER — Encounter: Payer: Self-pay | Admitting: Nurse Practitioner

## 2021-02-07 VITALS — BP 126/76 | HR 69 | Temp 97.9°F | Wt 228.0 lb

## 2021-02-07 DIAGNOSIS — G620 Drug-induced polyneuropathy: Secondary | ICD-10-CM

## 2021-02-07 DIAGNOSIS — I440 Atrioventricular block, first degree: Secondary | ICD-10-CM

## 2021-02-07 DIAGNOSIS — E1169 Type 2 diabetes mellitus with other specified complication: Secondary | ICD-10-CM | POA: Diagnosis not present

## 2021-02-07 DIAGNOSIS — T451X5A Adverse effect of antineoplastic and immunosuppressive drugs, initial encounter: Secondary | ICD-10-CM

## 2021-02-07 DIAGNOSIS — Z950 Presence of cardiac pacemaker: Secondary | ICD-10-CM

## 2021-02-07 DIAGNOSIS — E785 Hyperlipidemia, unspecified: Secondary | ICD-10-CM

## 2021-02-07 DIAGNOSIS — I152 Hypertension secondary to endocrine disorders: Secondary | ICD-10-CM

## 2021-02-07 DIAGNOSIS — E1159 Type 2 diabetes mellitus with other circulatory complications: Secondary | ICD-10-CM | POA: Diagnosis not present

## 2021-02-07 DIAGNOSIS — E559 Vitamin D deficiency, unspecified: Secondary | ICD-10-CM

## 2021-02-07 DIAGNOSIS — R7989 Other specified abnormal findings of blood chemistry: Secondary | ICD-10-CM

## 2021-02-07 DIAGNOSIS — E538 Deficiency of other specified B group vitamins: Secondary | ICD-10-CM

## 2021-02-07 LAB — BAYER DCA HB A1C WAIVED: HB A1C (BAYER DCA - WAIVED): 6.1 % (ref <7.0)

## 2021-02-07 NOTE — Assessment & Plan Note (Signed)
Chronic, ongoing, did not tolerate oral supplement daily.  Recheck B12 today and if low levels consider monthly injections.

## 2021-02-07 NOTE — Assessment & Plan Note (Signed)
Chronic, ongoing.  Continue current medication regimen and adjust as needed.  Lipid panel up to date, recheck next visit.

## 2021-02-07 NOTE — Patient Instructions (Signed)
Diabetes Mellitus and Nutrition, Adult When you have diabetes, or diabetes mellitus, it is very important to have healthy eating habits because your blood sugar (glucose) levels are greatly affected by what you eat and drink. Eating healthy foods in the right amounts, at about the same times every day, can help you:  Control your blood glucose.  Lower your risk of heart disease.  Improve your blood pressure.  Reach or maintain a healthy weight. What can affect my meal plan? Every person with diabetes is different, and each person has different needs for a meal plan. Your health care provider may recommend that you work with a dietitian to make a meal plan that is best for you. Your meal plan may vary depending on factors such as:  The calories you need.  The medicines you take.  Your weight.  Your blood glucose, blood pressure, and cholesterol levels.  Your activity level.  Other health conditions you have, such as heart or kidney disease. How do carbohydrates affect me? Carbohydrates, also called carbs, affect your blood glucose level more than any other type of food. Eating carbs naturally raises the amount of glucose in your blood. Carb counting is a method for keeping track of how many carbs you eat. Counting carbs is important to keep your blood glucose at a healthy level, especially if you use insulin or take certain oral diabetes medicines. It is important to know how many carbs you can safely have in each meal. This is different for every person. Your dietitian can help you calculate how many carbs you should have at each meal and for each snack. How does alcohol affect me? Alcohol can cause a sudden decrease in blood glucose (hypoglycemia), especially if you use insulin or take certain oral diabetes medicines. Hypoglycemia can be a life-threatening condition. Symptoms of hypoglycemia, such as sleepiness, dizziness, and confusion, are similar to symptoms of having too much  alcohol.  Do not drink alcohol if: ? Your health care provider tells you not to drink. ? You are pregnant, may be pregnant, or are planning to become pregnant.  If you drink alcohol: ? Do not drink on an empty stomach. ? Limit how much you use to:  0-1 drink a day for women.  0-2 drinks a day for men. ? Be aware of how much alcohol is in your drink. In the [place] one drink equals one 12 oz bottle of beer (355 mL), one 5 oz glass of wine (148 mL), or one 1 oz glass of hard liquor (44 mL). ? Keep yourself hydrated with water, diet soda, or unsweetened iced tea.  Keep in mind that regular soda, juice, and other mixers may contain a lot of sugar and must be counted as carbs. What are tips for following this plan? Reading food labels  Start by checking the serving size on the "Nutrition Facts" label of packaged foods and drinks. The amount of calories, carbs, fats, and other nutrients listed on the label is based on one serving of the item. Many items contain more than one serving per package.  Check the total grams (g) of carbs in one serving. You can calculate the number of servings of carbs in one serving by dividing the total carbs by 15. For example, if a food has 30 g of total carbs per serving, it would be equal to 2 servings of carbs.  Check the number of grams (g) of saturated fats and trans fats in one serving. Choose foods that have   a low amount or none of these fats.  Check the number of milligrams (mg) of salt (sodium) in one serving. Most people should limit total sodium intake to less than 2,300 mg per day.  Always check the nutrition information of foods labeled as "low-fat" or "nonfat." These foods may be higher in added sugar or refined carbs and should be avoided.  Talk to your dietitian to identify your daily goals for nutrients listed on the label. Shopping  Avoid buying canned, pre-made, or processed foods. These foods tend to be high in fat, sodium, and added  sugar.  Shop around the outside edge of the grocery store. This is where you will most often find fresh fruits and vegetables, bulk grains, fresh meats, and fresh dairy. Cooking  Use low-heat cooking methods, such as baking, instead of high-heat cooking methods like deep frying.  Cook using healthy oils, such as olive, canola, or sunflower oil.  Avoid cooking with butter, cream, or high-fat meats. Meal planning  Eat meals and snacks regularly, preferably at the same times every day. Avoid going long periods of time without eating.  Eat foods that are high in fiber, such as fresh fruits, vegetables, beans, and whole grains. Talk with your dietitian about how many servings of carbs you can eat at each meal.  Eat 4-6 oz (112-168 g) of lean protein each day, such as lean meat, chicken, fish, eggs, or tofu. One ounce (oz) of lean protein is equal to: ? 1 oz (28 g) of meat, chicken, or fish. ? 1 egg. ?  cup (62 g) of tofu.  Eat some foods each day that contain healthy fats, such as avocado, nuts, seeds, and fish.   What foods should I eat? Fruits Berries. Apples. Oranges. Peaches. Apricots. Plums. Grapes. Mango. Papaya. Pomegranate. Kiwi. Cherries. Vegetables Lettuce. Spinach. Leafy greens, including kale, chard, collard greens, and mustard greens. Beets. Cauliflower. Cabbage. Broccoli. Carrots. Green beans. Tomatoes. Peppers. Onions. Cucumbers. Brussels sprouts. Grains Whole grains, such as whole-wheat or whole-grain bread, crackers, tortillas, cereal, and pasta. Unsweetened oatmeal. Quinoa. Brown or wild rice. Meats and other proteins Seafood. Poultry without skin. Lean cuts of poultry and beef. Tofu. Nuts. Seeds. Dairy Low-fat or fat-free dairy products such as milk, yogurt, and cheese. The items listed above may not be a complete list of foods and beverages you can eat. Contact a dietitian for more information. What foods should I avoid? Fruits Fruits canned with  syrup. Vegetables Canned vegetables. Frozen vegetables with butter or cream sauce. Grains Refined white flour and flour products such as bread, pasta, snack foods, and cereals. Avoid all processed foods. Meats and other proteins Fatty cuts of meat. Poultry with skin. Breaded or fried meats. Processed meat. Avoid saturated fats. Dairy Full-fat yogurt, cheese, or milk. Beverages Sweetened drinks, such as soda or iced tea. The items listed above may not be a complete list of foods and beverages you should avoid. Contact a dietitian for more information. Questions to ask a health care provider  Do I need to meet with a diabetes educator?  Do I need to meet with a dietitian?  What number can I call if I have questions?  When are the best times to check my blood glucose? Where to find more information:  American Diabetes Association: diabetes.org  Academy of Nutrition and Dietetics: www.eatright.org  National Institute of Diabetes and Digestive and Kidney Diseases: www.niddk.nih.gov  Association of Diabetes Care and Education Specialists: www.diabeteseducator.org Summary  It is important to have healthy eating   habits because your blood sugar (glucose) levels are greatly affected by what you eat and drink.  A healthy meal plan will help you control your blood glucose and maintain a healthy lifestyle.  Your health care provider may recommend that you work with a dietitian to make a meal plan that is best for you.  Keep in mind that carbohydrates (carbs) and alcohol have immediate effects on your blood glucose levels. It is important to count carbs and to use alcohol carefully. This information is not intended to replace advice given to you by your health care provider. Make sure you discuss any questions you have with your health care provider. Document Revised: 07/20/2019 Document Reviewed: 07/20/2019 Elsevier Patient Education  2021 [employer] Inc.  

## 2021-02-07 NOTE — Assessment & Plan Note (Signed)
Chronic, ongoing.  Continue Lyrica and adjust as needed, especially with aging monitor dose closely.  Return in 3 months.

## 2021-02-07 NOTE — Assessment & Plan Note (Signed)
Chronically low.  Continue collaboration with nephrology and Amiloride as ordered by them and daily Mag.  Mag level today.

## 2021-02-07 NOTE — Assessment & Plan Note (Signed)
Noted on recent labs with downward trend, no symptoms.  Recheck today and initiate medication if TSH >6.  Discussed with patient.

## 2021-02-07 NOTE — Assessment & Plan Note (Signed)
BMI 39.75 with T2DM and HTN.  Recommended eating smaller high protein, low fat meals more frequently and exercising 30 mins a day 5 times a week with a goal of 10-15lb weight loss in the next 3 months. Patient voiced their understanding and motivation to adhere to these recommendations.

## 2021-02-07 NOTE — Assessment & Plan Note (Signed)
Chronic, ongoing with A1C today 6.1%. Urine micro ALB 10 and A:C <30 in March 2022. Continue current medication regimen as A1C at goal and no hypoglycemia at home. If continued downward trend may consider reduction of medication.  Continue to monitor BS at home daily.   Return in 3 months for visit.

## 2021-02-07 NOTE — Progress Notes (Signed)
BP 126/76 (BP Location: Right Arm, Cuff Size: Normal)   Pulse 69   Temp 97.9 F (36.6 C) (Oral)   Wt 228 lb (103.4 kg)   SpO2 98%   BMI 39.75 kg/m    Subjective:    Patient ID: [name], female    DOB: 11/28/1950, 70 y.o.   MRN: [id number]  HPI: [name] is a 70 y.o. female  Chief Complaint  Patient presents with   Diabetes   Hyperlipidemia   Hypertension   DIABETES Continues on Metformin 500 MG BID.  Last A1C in March 6%.   Hypoglycemic episodes:no Polydipsia/polyuria: no Visual disturbance: no Chest pain: no Paresthesias: no Glucose Monitoring: yes             Accucheck frequency: Daily             Fasting glucose: on average 110-120, this morning 128             Post prandial:             Evening:             Before meals: Taking Insulin?: no             Long acting insulin:             Short acting insulin: Blood Pressure Monitoring: daily Retinal Examination: Up to Date -- went May 23rd, [facility] Foot Exam: Up to Date Pneumovax: Up to Date Influenza: Up to Date Aspirin: yes    HYPERTENSION / HYPERLIPIDEMIA Has pacemaker, PPM [employer] -- 2 1/2 to 3 years on pacemaker.  Followed by cardiology and last seen 10/24/20.   Satisfied with current treatment? yes Duration of hypertension: chronic BP monitoring frequency: daily BP range: 110-120/70 range at home BP medication side effects: no Duration of hyperlipidemia: chronic Cholesterol medication side effects: no Cholesterol supplements: none Medication compliance: good compliance Aspirin: no Recent stressors: no Recurrent headaches: no Visual changes: no Palpitations: no Dyspnea: no Chest pain: no Lower extremity edema: no Dizzy/lightheaded: no    HISTORY OF BREAST CA: Has ongoing neuropathy and hypomagnesemia, was followed by oncology.  Received magnesium infusions, at this time is transitioned to PCP monitoring. Takes Lyrica for neuropathy pain.  Currently is being followed by GYN  for a cyst to right ovary for which they performed removal on 01/18/21.    ELEVATED TSH Noted on past two labs with some downward trend last check -- 5.100, Free T4 1.10. Fatigue: yes -- recent surgery Cold intolerance: no Heat intolerance: no Weight gain: no Weight loss: no Constipation: no Diarrhea/loose stools: no Palpitations: no Lower extremity edema: no Anxiety/depressed mood: no   Relevant past medical, surgical, family and social history reviewed and updated as indicated. Interim medical history since our last visit reviewed. Allergies and medications reviewed and updated.  Review of Systems  Constitutional:  Negative for activity change, appetite change, diaphoresis, fatigue and fever.  Respiratory:  Negative for cough, chest tightness and shortness of breath.   Cardiovascular:  Negative for chest pain, palpitations and leg swelling.  Gastrointestinal: Negative.   Neurological: Negative.   Psychiatric/Behavioral: Negative.     Per HPI unless specifically indicated above     Objective:    BP 126/76 (BP Location: Right Arm, Cuff Size: Normal)   Pulse 69   Temp 97.9 F (36.6 C) (Oral)   Wt 228 lb (103.4 kg)   SpO2 98%   BMI 39.75 kg/m   Wt Readings from  Last 3 Encounters:  02/07/21 228 lb (103.4 kg)  01/25/21 228 lb 9.6 oz (103.7 kg)  12/27/20 230 lb 12.8 oz (104.7 kg)    Physical Exam Vitals and nursing note reviewed.  Constitutional:      General: She is awake. She is not in acute distress.    Appearance: She is well-developed and well-groomed. She is morbidly obese. She is not ill-appearing.  HENT:     Head: Normocephalic.     Right Ear: Hearing, tympanic membrane, ear canal and external ear normal.     Left Ear: Hearing, tympanic membrane, ear canal and external ear normal.     Nose: Nose normal.     Right Sinus: No maxillary sinus tenderness or frontal sinus tenderness.     Left Sinus: No maxillary sinus tenderness or frontal sinus tenderness.      Mouth/Throat:     Mouth: Mucous membranes are moist.  Eyes:     General: Lids are normal.        Right eye: No discharge.        Left eye: No discharge.     Conjunctiva/sclera: Conjunctivae normal.     Pupils: Pupils are equal, round, and reactive to light.  Neck:     Thyroid: No thyromegaly.     Vascular: No carotid bruit.  Cardiovascular:     Rate and Rhythm: Normal rate and regular rhythm.     Heart sounds: Normal heart sounds. No murmur heard.   No gallop.  Pulmonary:     Effort: Pulmonary effort is normal. No accessory muscle usage or respiratory distress.     Breath sounds: Normal breath sounds.  Abdominal:     General: Bowel sounds are normal.     Palpations: Abdomen is soft.  Musculoskeletal:     Cervical back: Normal range of motion and neck supple.     Right lower leg: No edema.     Left lower leg: No edema.  Skin:    General: Skin is warm and dry.  Neurological:     Mental Status: She is alert and oriented to person, place, and time.  Psychiatric:        Attention and Perception: Attention normal.        Mood and Affect: Mood normal.        Speech: Speech normal.        Behavior: Behavior normal. Behavior is cooperative.        Thought Content: Thought content normal.    Results for orders placed or performed during the hospital encounter of 01/18/21  Glucose, capillary  Result Value Ref Range   Glucose-Capillary 124 (H) 70 - 99 mg/dL  Glucose, capillary  Result Value Ref Range   Glucose-Capillary 148 (H) 70 - 99 mg/dL  CBC  Result Value Ref Range   WBC 10.1 4.0 - 10.5 K/uL   RBC 3.52 (L) 3.87 - 5.11 MIL/uL   Hemoglobin 10.4 (L) 12.0 - 15.0 g/dL   HCT 31.0 (L) 36.0 - 46.0 %   MCV 88.1 80.0 - 100.0 fL   MCH 29.5 26.0 - 34.0 pg   MCHC 33.5 30.0 - 36.0 g/dL   RDW 12.9 11.5 - 15.5 %   Platelets 169 150 - 400 K/uL   nRBC 0.0 0.0 - 0.2 %  ABO/Rh  Result Value Ref Range   ABO/RH(D)      O NEG Performed at [facility], [address],  [place], [place] [address]   Surgical pathology  Result  Value Ref Range   SURGICAL PATHOLOGY      SURGICAL PATHOLOGY CASE: [id number] PATIENT: [facility] Report     Specimen Submitted: A. Ovary, right, and fallopian tube  Clinical History: Right ovarian cyst      DIAGNOSIS: A. OVARY AND FALLOPIAN TUBE, RIGHT; SALPINGO-OOPHORECTOMY: - OVARY WITH MATURE CYSTIC TERATOMA. - FALLOPIAN TUBE WITH NO SIGNIFICANT PATHOLOGIC ALTERATION. - NEGATIVE FOR MALIGNANCY.   GROSS DESCRIPTION: A. Labeled: Right ovary and fallopian tube Received: Formalin Collection time: 10:41 AM on 01/18/2021 Placed into formalin time: 10:43 AM on 01/18/2021 Type of procedure: Right oophorectomy Integrity: Intact Weight of specimen: 26.87 grams Size of specimen:           Ovary: 4.9 x 2.8 x 2.8 cm           Fallopian tube: 3.2 cm long by 0.5 cm in diameter Ovarian external surface: The ovarian serosa is white-pink, smooth, and glistening. Ovarian internal surface: The majority of the ovary is comprised of a 4 x 2.8 x 1.6 cm unilocular cyst.  The cyst contains y ellow and white sebaceous contents.  The cyst wall is smooth with scattered areas of white roughening.  Surrounding the cyst is a thin peripheral rim of potential ovarian parenchyma.  No additional distinct masses or lesions are grossly identified. Fallopian tube lumen: The fallopian tube is fimbriated with a tan-pink, smooth, and glistening serosa.  The lumen is pinpoint and patent.  Block summary: 1 - 3 - representative sections of cyst wall with areas of white discoloration and peripheral ovarian parenchyma 4 - fallopian tube fimbria, longitudinally bisected and submitted entirely, with representative cross-sections  RB 01/17/2021  Final Diagnosis performed by Betsy Pries, MD.   Electronically signed 01/19/2021 10:58:53AM The electronic signature indicates that the named Attending Pathologist has evaluated the  specimen Technical component performed at [facility], [address] [place], [place] [address] Lab: [id number] Dir: Rush Farmer, MD, MMM  Professional c omponent performed at [facility], [facility], [place], [place], [place] [address] Lab: [id number] Dir: Dellia Nims. Reuel Derby, MD       Assessment & Plan:   Problem List Items Addressed This Visit       Cardiovascular and Mediastinum   AV BLOCK, 1ST DEGREE    Followed by cardiology, continue collaboration and review notes.       Hypertension associated with diabetes ([place])    Chronic, ongoing with BP well below goal.  Continue current medication regimen and adjust as needed + continue collaboration with cardiology.  BMP today.  Recommend she continue to monitor BP at home and focus on DASH diet.  Return in 3 months.       Relevant Orders   Bayer DCA Hb A1c Waived   Basic metabolic panel     Endocrine   Diabetes mellitus associated with hormonal etiology ([place]) - Primary    Chronic, ongoing with A1C today 6.1%. Urine micro ALB 10 and A:C <30 in March 2022. Continue current medication regimen as A1C at goal and no hypoglycemia at home. If continued downward trend may consider reduction of medication.  Continue to monitor BS at home daily.   Return in 3 months for visit.       Hyperlipidemia associated with type 2 diabetes mellitus (HCC)    Chronic, ongoing.  Continue current medication regimen and adjust as needed.  Lipid panel up to date, recheck next visit.       Relevant Orders   Bayer DCA Hb A1c  Waived     Nervous and Auditory   Chemotherapy-induced neuropathy (HCC)    Chronic, ongoing.  Continue Lyrica and adjust as needed, especially with aging monitor dose closely.  Return in 3 months.         Other   PPM-[employer]    Continue collaboration with cardiology for checks.       Hypomagnesemia    Chronically low.  Continue collaboration with nephrology and Amiloride as ordered by  them and daily Mag.  Mag level today.       Relevant Orders   Magnesium   B12 deficiency    Chronic, ongoing, did not tolerate oral supplement daily.  Recheck B12 today and if low levels consider monthly injections.       Relevant Orders   Vitamin B12   Morbid obesity ([place])    BMI 39.75 with T2DM and HTN.  Recommended eating smaller high protein, low fat meals more frequently and exercising 30 mins a day 5 times a week with a goal of 10-15lb weight loss in the next 3 months. Patient voiced their understanding and motivation to adhere to these recommendations.        Elevated TSH    Noted on recent labs with downward trend, no symptoms.  Recheck today and initiate medication if TSH >6.  Discussed with patient.       Relevant Orders   TSH   T4, free   Other Visit Diagnoses     Vitamin D deficiency       History of low levels reported, recheck today and adjust supplement as needed.   Relevant Orders   VITAMIN D 25 Hydroxy (Vit-D Deficiency, Fractures)        Follow up plan: Return in about 3 months (around 05/10/2021) for T2DM, HTN/HLD, MAG LEVEL.

## 2021-02-07 NOTE — Assessment & Plan Note (Signed)
Chronic, ongoing with BP well below goal.  Continue current medication regimen and adjust as needed + continue collaboration with cardiology.  BMP today.  Recommend she continue to monitor BP at home and focus on DASH diet.  Return in 3 months.

## 2021-02-07 NOTE — Assessment & Plan Note (Signed)
Continue collaboration with cardiology for checks. 

## 2021-02-07 NOTE — Assessment & Plan Note (Signed)
Followed by cardiology, continue collaboration and review notes.

## 2021-02-08 LAB — BASIC METABOLIC PANEL
BUN/Creatinine Ratio: 19 (ref 12–28)
BUN: 21 mg/dL (ref 8–27)
CO2: 22 mmol/L (ref 20–29)
Calcium: 9.4 mg/dL (ref 8.7–10.3)
Chloride: 104 mmol/L (ref 96–106)
Creatinine, Ser: 1.1 mg/dL — ABNORMAL HIGH (ref 0.57–1.00)
Glucose: 109 mg/dL — ABNORMAL HIGH (ref 65–99)
Potassium: 5.1 mmol/L (ref 3.5–5.2)
Sodium: 141 mmol/L (ref 134–144)
eGFR: 54 mL/min/{1.73_m2} — ABNORMAL LOW (ref >59)

## 2021-02-08 LAB — VITAMIN B12: Vitamin B-12: 475 pg/mL (ref 232–1245)

## 2021-02-08 LAB — TSH: TSH: 4.73 u[IU]/mL — ABNORMAL HIGH (ref 0.450–4.500)

## 2021-02-08 LAB — MAGNESIUM: Magnesium: 1.4 mg/dL — ABNORMAL LOW (ref 1.6–2.3)

## 2021-02-08 LAB — VITAMIN D 25 HYDROXY (VIT D DEFICIENCY, FRACTURES): Vit D, 25-Hydroxy: 33.2 ng/mL (ref 30.0–100.0)

## 2021-02-08 LAB — T4, FREE: Free T4: 1.21 ng/dL (ref 0.82–1.77)

## 2021-02-08 NOTE — Progress Notes (Signed)
Contacted via MyChart  Good afternoon [name], it was a pleasure seeing you as always. - Kidney function, creatinine and eGFR, continues to show some mild kidney disease we will continue to monitor closely at visits. - Liver function, AST and ALT, is normal - Thyroid lab remains elevated, but is trending down.  We will recheck this next visit and if any ongoing elevation may recommend medication if symptoms of fatigue, constipation, diarrhea, mood changes. - Vitamin D and B12 normal - Magnesium level remains at baseline, continue your medication for this.  Any questions? Keep being awesome!!  Thank you for allowing me to participate in your care.  I appreciate you. Kindest regards, Jarmon Javid

## 2021-02-13 ENCOUNTER — Other Ambulatory Visit: Payer: Self-pay

## 2021-02-13 ENCOUNTER — Ambulatory Visit (INDEPENDENT_AMBULATORY_CARE_PROVIDER_SITE_OTHER): Payer: Medicare PPO | Admitting: Internal Medicine

## 2021-02-13 ENCOUNTER — Encounter: Payer: Self-pay | Admitting: Internal Medicine

## 2021-02-13 VITALS — BP 138/72 | HR 78 | Ht 63.5 in | Wt 229.0 lb

## 2021-02-13 DIAGNOSIS — I440 Atrioventricular block, first degree: Secondary | ICD-10-CM | POA: Diagnosis not present

## 2021-02-13 DIAGNOSIS — Z950 Presence of cardiac pacemaker: Secondary | ICD-10-CM

## 2021-02-13 NOTE — Progress Notes (Signed)
Patient ID: [name], female   DOB: [birth year], 70 y.o.   MRN: [id number] Man    Electrophysiology Office Note   Date:  02/13/2021   ID:  Alajiah, Dutkiewicz 08-Feb-1951, MRN [id number]  PCP:  Venita Lick, NP  Cardiologist:  none Primary Electrophysiologist:      No chief complaint on file.    History of Present Illness: [name] is a 70 y.o. 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 for which she underwent pacing remotely w/ generator replacement Dec 2011    Hx of breast CA 2000's   Echo 06/2010 normal LV function      Today, The patient denies chest pain, , nocturnal dyspnea, orthopnea or . There have been no palpitations, lightheadedness or syncope.  Complains of shortness of breath and LE edema . She states when walking or physical activity she do have exertional shortness of breath along with mild wheezing   She have been doing fairly well considering being released from the hospital 3 weeks ago for surgery   Neg sleep study in the past   Date   Cr          K      Hgb   12/21 0.91 4.8  (2/21)13.4   6/22 1.10  5.1 (5/22) 10.4     Past Medical History:  Diagnosis Date   Arthritis    Breast cancer, left ([place]) 09/2007   s/p lumpectomy and chemoradiation   Cardiomyopathy secondary    GERD (gastroesophageal reflux disease)    Glaucoma    History of first degree AV block    high grade; PPM placed   History of kidney stones    Hypersomnolence 09/21/2013   Hypertension    Hypothyroidism    no meds currently   Leg edema, left 09/21/2013   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   Migraines    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   Ovarian cyst 11/06/2020   Pacemaker [employer] 2004   Personal history of chemotherapy 2009   left breast ca   Personal history of radiation therapy 2009   left breast   Sinus tachycardia    Type 2 diabetes mellitus ([place]) 06/19/2015   Past Surgical  History:  Procedure Laterality Date   ANKLE SURGERY Left    x 3   BREAST EXCISIONAL BIOPSY Left 2009    Stage IIa, ER/PR negative, 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,   BREAST LUMPECTOMY Left 2009   chemo and rad tx   [place] Left 12/2018   cataracts-Dr.Shah in [place] Right 01/2019   cataracts- Dr.Shah in [place] / REPLACE / REMOVE PACEMAKER  2011   revision @ [place]  05/13/2003   Guidant [facility]man Comp - DR   ROBOTIC ASSISTED SALPINGO OOPHERECTOMY Right 01/18/2021   Procedure: XI ROBOTIC ASSISTED RIGHT SALPINGO-OOPHORECTOMY;  Surgeon: Homero Fellers, MD;  Location: [facility] ORS;  Service: Gynecology;  Laterality: Right;   Allergies:   Penicillins, Levofloxacin, and Other   Physical Exam: BP 138/72 (BP Location: Left Arm, Patient Position: Sitting, Cuff Size: Large)   Pulse 78   Ht 5' 3.5" (1.613 m)   Wt 229 lb (103.9 kg)   SpO2 94%   BMI  39.93 kg/m  Well developed and well nourished in no acute distress HENT normal Neck supple with JVP-flat Lungs Clear Device pocket well healed; without hematoma or erythema.  There is no tethering  Regular rate and rhythm, no  gallop No  murmur Abd-soft with active BS No Clubbing cyanosis LE edema Skin-warm and dry A & Oriented  Grossly normal sensory and motor function  ECG: sinus @ 78 24/09/36   ASSESSMENT AND PLAN: Dyspnea on Exertion/HFpEF  1 AV block--intermittent  Hypertension  Diabetes  Pacemaker [employer]     Vpacing decreased 30>>19%   Continues to ventricularly paced.  Device reprogrammed to minimize that by increasing the AV delay 250--300  Blood pressure reasonably controlled.  We will continue her on atenolol 50.  We will plan initiation of losartan 25.  With her HFpEF and diabetes I have asked that she follow-up with her PCP regarding  discontinuing her Glucophage and putting her on an SGLT2-specifically Jardiance 10  Encouraged weight loss  I,Stephanie Williams,acting as a scribe for Virl Axe, MD.,have documented all relevant documentation on the behalf of Virl Axe, MD,as directed by  Virl Axe, MD while in the presence of Virl Axe, MD.  I, Virl Axe, MD, have reviewed all documentation for this visit. The documentation on 02/13/21 for the exam, diagnosis, procedures, and orders are all accurate and complete.

## 2021-02-13 NOTE — Patient Instructions (Signed)
Medication Instructions:  - Your physician recommends that you continue on your current medications as directed. Please refer to the Current Medication list given to you today.  - Talk with your Primary Care doctor about the possibility of changing metformin to Jardiance/ Farxiga  *If you need a refill on your cardiac medications before your next appointment, please call your pharmacy*   Lab Work: - none ordered  If you have labs (blood work) drawn today and your tests are completely normal, you will receive your results only by: [place] (if you have MyChart) OR A paper copy in the mail If you have any lab test that is abnormal or we need to change your treatment, we will call you to review the results.   Testing/Procedures: - none ordered   Follow-Up: At [facility], you and your health needs are our priority.  As part of our continuing mission to provide you with exceptional heart care, we have created designated Provider Care Teams.  These Care Teams include your primary Cardiologist (physician) and Advanced Practice Providers (APPs -  Physician Assistants and Nurse Practitioners) who all work together to provide you with the care you need, when you need it.  We recommend signing up for the patient portal called "MyChart".  Sign up information is provided on this After Visit Summary.  MyChart is used to connect with patients for Virtual Visits (Telemedicine).  Patients are able to view lab/test results, encounter notes, upcoming appointments, etc.  Non-urgent messages can be sent to your provider as well.   To learn more about what you can do with MyChart, go to [email or web address].    Your next appointment:   1 year(s)  The format for your next appointment:   In Person  Provider:   Virl Axe, MD   Other Instructions N/a

## 2021-02-15 ENCOUNTER — Encounter: Payer: Self-pay | Admitting: Obstetrics and Gynecology

## 2021-02-15 ENCOUNTER — Ambulatory Visit (INDEPENDENT_AMBULATORY_CARE_PROVIDER_SITE_OTHER): Payer: Medicare PPO | Admitting: Obstetrics and Gynecology

## 2021-02-15 ENCOUNTER — Other Ambulatory Visit: Payer: Self-pay

## 2021-02-15 VITALS — BP 122/70 | Ht 63.0 in | Wt 227.2 lb

## 2021-02-15 DIAGNOSIS — R141 Gas pain: Secondary | ICD-10-CM

## 2021-02-15 MED ORDER — SIMETHICONE 80 MG PO CHEW: 80.0000 mg | CHEWABLE_TABLET | Freq: Four times a day (QID) | ORAL | 1 refills | Status: DC | PRN

## 2021-02-15 NOTE — Progress Notes (Signed)
  Postoperative Follow-up Patient presents post op from [facility] Robot-assisted Laparoscopic Right Salpingo-oophorectomy  for  Right ovarian dermoid, 1 month ago.  Subjective: Patient reports marked improvement in her preop symptoms. Eating a regular diet without difficulty. Pain is controlled without any medications.  Activity: normal activities of daily living. Patient reports additional symptom's since surgery of gas pain.  Objective: BP 122/70   Ht 5\' 3"  (1.6 m)   Wt 227 lb 3.2 oz (103.1 kg)   BMI 40.25 kg/m  Physical Exam Constitutional:      Appearance: Normal appearance. She is well-developed.  HENT:     Head: Normocephalic and atraumatic.  Eyes:     Extraocular Movements: Extraocular movements intact.     Pupils: Pupils are equal, round, and reactive to light.  Neck:     Thyroid: No thyromegaly.  Cardiovascular:     Rate and Rhythm: Normal rate and regular rhythm.     Heart sounds: Normal heart sounds.  Pulmonary:     Effort: Pulmonary effort is normal.     Breath sounds: Normal breath sounds.  Abdominal:     General: Bowel sounds are normal. There is no distension.     Palpations: Abdomen is soft. There is no mass.     Comments: Incisions are clean, dry, intact  Musculoskeletal:     Cervical back: Neck supple.  Neurological:     Mental Status: She is alert and oriented to person, place, and time.  Skin:    General: Skin is warm and dry.  Psychiatric:        Behavior: Behavior normal.        Thought Content: Thought content normal.        Judgment: Judgment normal.  Vitals reviewed.    Assessment: s/p :  [facility] Robot-assisted Laparoscopic Right Salpingo-oophorectomy  for  Right ovarian dermoidstable  Plan: Patient has done well after surgery with no apparent complications.  I have discussed the post-operative course to date, and the expected progress moving forward.  The patient understands what complications to be concerned about.  I will see the patient in routine  follow up, or sooner if needed.    Activity plan: may return to normal lifting at 6 weeks post op  Adrian Prows MD, [place], [facility] 02/15/2021 3:46 PM

## 2021-02-22 ENCOUNTER — Other Ambulatory Visit: Payer: Self-pay | Admitting: Internal Medicine

## 2021-02-27 ENCOUNTER — Other Ambulatory Visit: Payer: Self-pay | Admitting: Nurse Practitioner

## 2021-02-27 NOTE — Telephone Encounter (Signed)
Pt is scheduled 9/20 

## 2021-02-27 NOTE — Telephone Encounter (Signed)
  Notes to clinic:  script requested has expired  Review for continued use and refill    Requested Prescriptions  Pending Prescriptions Disp Refills   nystatin (MYCOSTATIN/NYSTOP) powder [Pharmacy Med Name: NYSTATIN 100,000 UNIT/GM POWD] 15 g 0    Sig: Apply 1 application topically 2 (two) times daily.      Off-Protocol Failed - 02/27/2021 11:37 AM      Failed - Medication not assigned to a protocol, review manually.      Passed - Valid encounter within last 12 months    Recent Outpatient Visits           2 weeks ago Diabetes mellitus associated with hormonal etiology ([place])   [place], [name], NP   3 months ago Diabetes mellitus associated with hormonal etiology ([place])   [place] [name], [name], NP   4 months ago Kidney stone on left side   [place], Paulina T, NP   5 months ago Cough   [place], [place] T, NP   6 months ago Diabetes mellitus associated with hormonal etiology ([place])   [place], [name], NP       Future Appointments             In 2 months [name], [name], NP [employer], [place]   In 3 months [place], [place], MD [place]   In 3 months  [employer], [name]

## 2021-03-05 ENCOUNTER — Encounter: Payer: Self-pay | Admitting: Nurse Practitioner

## 2021-03-05 ENCOUNTER — Ambulatory Visit: Payer: Medicare PPO | Admitting: Nurse Practitioner

## 2021-03-05 ENCOUNTER — Other Ambulatory Visit: Payer: Self-pay

## 2021-03-05 DIAGNOSIS — E1169 Type 2 diabetes mellitus with other specified complication: Secondary | ICD-10-CM | POA: Diagnosis not present

## 2021-03-05 MED ORDER — EMPAGLIFLOZIN 25 MG PO TABS: 25.0000 mg | ORAL_TABLET | Freq: Every day | ORAL | 4 refills | Status: DC

## 2021-03-05 NOTE — Assessment & Plan Note (Signed)
Chronic, ongoing with A1C recently 6.1%. Urine micro ALB 10 and A:C <30 in March 2022. Per recommendations from cardiology will stop Metformin and start Jardiance, script sent and educated patient on this change. If continued downward trend down may consider reduction of medication.  Continue to monitor BS at home daily.   Return in 4 weeks.  Will reach out to Dr. Caryl Comes about Losartan, as current BP on lower side and concern for hypotension if addition of extra BP medication, although this would benefit kidneys.

## 2021-03-05 NOTE — Patient Instructions (Signed)
Empagliflozin Oral Tablets What is this medication? EMPAGLIFLOZIN (EM pa gli FLOE zin) helps to treat type 2 diabetes. It helps to control blood sugar. Treatment is combined with diet and exercise. This drug may also reduce the risk of heart attack, stroke, or death if you have type 2 diabetes and risk factors for heart disease. It also treats heart failure. Itmay lower the risk for treatment of heart failure in the hospital. This medicine may be used for other purposes; ask your health care provider orpharmacist if you have questions. COMMON BRAND NAME(S): Jardiance What should I tell my care team before I take this medication? They need to know if you have any of these conditions: dehydration diabetic ketoacidosis diet low in salt eating less due to illness, surgery, dieting, or any other reason having surgery high cholesterol high levels of potassium in the blood history of pancreatitis or pancreas problems history of yeast infection of the penis or vagina if you often drink alcohol infections in the bladder, kidneys, or urinary tract kidney disease liver disease low blood pressure on hemodialysis problems urinating type 1 diabetes uncircumcised female an unusual or allergic reaction to empagliflozin, other medicines, foods, dyes, or preservatives pregnant or trying to get pregnant breast-feeding How should I use this medication? Take this medicine by mouth with water. Take it as directed on the prescription label at the same time every day. You may take it with or without food. Keeptaking it unless your health care provider tells you to stop. A special MedGuide will be given to you by the pharmacist with eachprescription and refill. Be sure to read this information carefully each time. Talk to your health care provider about the use of this medicine in children.Special care may be needed. Overdosage: If you think you have taken too much of this medicine contact apoison control  center or emergency room at once. NOTE: This medicine is only for you. Do not share this medicine with others. What if I miss a dose? If you miss a dose, take it as soon as you can. If it is almost time for yournext dose, take only that dose. Do not take double or extra doses. What may interact with this medication? alcohol diuretics insulin This list may not describe all possible interactions. Give your health care provider a list of all the medicines, herbs, non-prescription drugs, or dietary supplements you use. Also tell them if you smoke, drink alcohol, or use illegaldrugs. Some items may interact with your medicine. What should I watch for while using this medication? Visit your health care provider for regular checks on your progress. Tell your health care provider if your symptoms do not start to get better or if they getworse. This medicine can cause a serious condition in which there is too much acid in the blood. If you develop nausea, vomiting, stomach pain, unusual tiredness, or breathing problems, stop taking this medicine and call your doctor right away.If possible, use a ketone dipstick to check for ketones in your urine. Check with your health care provider if you have severe diarrhea, nausea, and vomiting, or if you sweat a lot. The loss of too much body fluid may make itdangerous for you to take this medicine. A test called the HbA1C (A1C) will be monitored. This is a simple blood test. It measures your blood sugar control over the last 2 to 3 months. You willreceive this test every 3 to 6 months. Learn how to check your blood sugar. Learn the symptoms of   low and high bloodsugar and how to manage them. Always carry a quick-source of sugar with you in case you have symptoms of low blood sugar. Examples include hard sugar candy or glucose tablets. Make sure others know that you can choke if you eat or drink when you develop serious symptoms of low blood sugar, such as seizures or  unconsciousness. Get medicalhelp at once. Tell your health care provider if you have high blood sugar. You might need to change the dose of your medicine. If you are sick or exercising more thanusual, you may need to change the dose of your medicine. What side effects may I notice from receiving this medication? Side effects that you should report to your doctor or health care professionalas soon as possible: allergic reactions (skin rash, itching or hives, swelling of the face, lips, or tongue) breathing problems dizziness feeling faint or lightheaded, falls genital infection (fever; tenderness, redness, or swelling in the genitals or area from the genitals to the back of the rectum) kidney injury (trouble passing urine or change in the amount of urine) low blood sugar (feeling anxious; confusion; dizziness; increased hunger; unusually weak or tired; increased sweating; shakiness; cold, clammy skin; irritable; headache; blurred vision; fast heartbeat; loss of consciousness) muscle weakness nausea, vomiting, unusual stomach upset or pain new pain or tenderness, change in skin color, sores or ulcers, or infection in legs or feet penile discharge, itching, or pain unusual tiredness unusual vaginal discharge, itching, or odor urinary tract infection (fever; chills; a burning feeling when urinating; urgent need to urinate more often; blood in the urine; back pain) Side effects that usually do not require medical attention (report to yourdoctor or health care professional if they continue or are bothersome): mild increase in urination thirsty This list may not describe all possible side effects. Call your doctor for medical advice about side effects. You may report side effects to FDA at1-800-FDA-1088. Where should I keep my medication? Keep out of the reach of children and pets. Store at room temperature between 20 and 25 degrees C (68 and 77 degrees F).Get rid of any unused medicine after the  expiration date. To get rid of medicines that are no longer needed or have expired: Take the medicine to a medicine take-back program. Check with your pharmacy or law enforcement to find a location. If you cannot return the medicine, check the label or package insert to see if the medicine should be thrown out in the garbage or flushed down the toilet. If you are not sure, ask your health care provider. If it is safe to put it in the trash, take the medicine out of the container. Mix the medicine with cat litter, dirt, coffee grounds, or other unwanted substance. Seal the mixture in a bag or container. Put it in the trash. NOTE: This sheet is a summary. It may not cover all possible information. If you have questions about this medicine, talk to your doctor, pharmacist, orhealth care provider.  2022 Elsevier/Gold Standard (2020-04-14 19:51:17)  

## 2021-03-05 NOTE — Progress Notes (Signed)
BP 98/62   Pulse 73   Temp 98.6 F (37 C) (Oral)   Wt 227 lb 3.2 oz (103.1 kg)   SpO2 98%   BMI 40.25 kg/m    Subjective:    Patient ID: [name], female    DOB: 1950-10-11, 70 y.o.   MRN: [id number]  HPI: [name] is a 70 y.o. female  Chief Complaint  Patient presents with   Medication Problem    Patient states she was seen by Dr.Klein and was informed that she may need to change her Diabetes medication. Patient states she has some questions about her chart and would like to discuss with provider.    Diabetes    Patient Diabetic Eye Exam requested at today's visit.    DIABETES Continues on Metformin 500 MG BID.  Last A1C in June 6.1%.  At recent cardiology visit with Dr. Caryl Comes he recommended starting Jardiance and stopping Metformin.  He also noted starting Losartan with her diabetes -- often BP at home runs 115-125/70-80 range (last visit 126/76).   Hypoglycemic episodes:no Polydipsia/polyuria: no Visual disturbance: no Chest pain: no Paresthesias: no Glucose Monitoring: yes             Accucheck frequency: Daily             Fasting glucose: on average 110-120, this morning 128             Post prandial:             Evening:             Before meals: Taking Insulin?: no             Long acting insulin:             Short acting insulin: Blood Pressure Monitoring: daily Retinal Examination: Up to Date  Foot Exam: Up to Date Pneumovax: Up to Date Influenza: Up to Date Aspirin: yes   Relevant past medical, surgical, family and social history reviewed and updated as indicated. Interim medical history since our last visit reviewed. Allergies and medications reviewed and updated.  Review of Systems  Constitutional:  Negative for activity change, appetite change, diaphoresis, fatigue and fever.  Respiratory:  Negative for cough, chest tightness and shortness of breath.   Cardiovascular:  Negative for chest pain, palpitations and leg swelling.  Gastrointestinal:  Negative.   Neurological: Negative.   Psychiatric/Behavioral: Negative.     Per HPI unless specifically indicated above     Objective:    BP 98/62   Pulse 73   Temp 98.6 F (37 C) (Oral)   Wt 227 lb 3.2 oz (103.1 kg)   SpO2 98%   BMI 40.25 kg/m   Wt Readings from Last 3 Encounters:  03/05/21 227 lb 3.2 oz (103.1 kg)  02/15/21 227 lb 3.2 oz (103.1 kg)  02/13/21 229 lb (103.9 kg)    Physical Exam Vitals and nursing note reviewed.  Constitutional:      General: She is awake. She is not in acute distress.    Appearance: She is well-developed and well-groomed. She is morbidly obese. She is not ill-appearing.  HENT:     Head: Normocephalic.     Right Ear: Hearing, tympanic membrane, ear canal and external ear normal.     Left Ear: Hearing, tympanic membrane, ear canal and external ear normal.     Nose: Nose normal.     Right Sinus: No maxillary sinus tenderness or frontal sinus tenderness.  Left Sinus: No maxillary sinus tenderness or frontal sinus tenderness.     Mouth/Throat:     Mouth: Mucous membranes are moist.  Eyes:     General: Lids are normal.        Right eye: No discharge.        Left eye: No discharge.     Conjunctiva/sclera: Conjunctivae normal.     Pupils: Pupils are equal, round, and reactive to light.  Neck:     Thyroid: No thyromegaly.     Vascular: No carotid bruit.  Cardiovascular:     Rate and Rhythm: Normal rate and regular rhythm.     Heart sounds: Normal heart sounds. No murmur heard.   No gallop.  Pulmonary:     Effort: Pulmonary effort is normal. No accessory muscle usage or respiratory distress.     Breath sounds: Normal breath sounds.  Abdominal:     General: Bowel sounds are normal.     Palpations: Abdomen is soft.  Musculoskeletal:     Cervical back: Normal range of motion and neck supple.     Right lower leg: No edema.     Left lower leg: No edema.  Skin:    General: Skin is warm and dry.  Neurological:     Mental Status: She is  alert and oriented to person, place, and time.  Psychiatric:        Attention and Perception: Attention normal.        Mood and Affect: Mood normal.        Speech: Speech normal.        Behavior: Behavior normal. Behavior is cooperative.        Thought Content: Thought content normal.    Results for orders placed or performed in visit on 02/07/21  Bayer DCA Hb A1c Waived  Result Value Ref Range   HB A1C (BAYER DCA - WAIVED) 6.1 <2.7 %  Basic metabolic panel  Result Value Ref Range   Glucose 109 (H) 65 - 99 mg/dL   BUN 21 8 - 27 mg/dL   Creatinine, Ser 1.10 (H) 0.57 - 1.00 mg/dL   eGFR 54 (L) >59 mL/min/1.73   BUN/Creatinine Ratio 19 12 - 28   Sodium 141 134 - 144 mmol/L   Potassium 5.1 3.5 - 5.2 mmol/L   Chloride 104 96 - 106 mmol/L   CO2 22 20 - 29 mmol/L   Calcium 9.4 8.7 - 10.3 mg/dL  TSH  Result Value Ref Range   TSH 4.730 (H) 0.450 - 4.500 uIU/mL  T4, free  Result Value Ref Range   Free T4 1.21 0.82 - 1.77 ng/dL  VITAMIN D 25 Hydroxy (Vit-D Deficiency, Fractures)  Result Value Ref Range   Vit D, 25-Hydroxy 33.2 30.0 - 100.0 ng/mL  Vitamin B12  Result Value Ref Range   Vitamin B-12 475 232 - 1,245 pg/mL  Magnesium  Result Value Ref Range   Magnesium 1.4 (L) 1.6 - 2.3 mg/dL      Assessment & Plan:   Problem List Items Addressed This Visit       Endocrine   Diabetes mellitus associated with hormonal etiology ([place])    Chronic, ongoing with A1C recently 6.1%. Urine micro ALB 10 and A:C <30 in March 2022. Per recommendations from cardiology will stop Metformin and start Jardiance, script sent and educated patient on this change. If continued downward trend down may consider reduction of medication.  Continue to monitor BS at home daily.   Return in 4  weeks.  Will reach out to Dr. Caryl Comes about Losartan, as current BP on lower side and concern for hypotension if addition of extra BP medication, although this would benefit kidneys.       Relevant Medications    empagliflozin (JARDIANCE) 25 MG TABS tablet     Follow up plan: Return in about 30 days (around 04/04/2021) for Jardiance follow-up.

## 2021-04-04 ENCOUNTER — Ambulatory Visit: Payer: Medicare PPO | Admitting: Nurse Practitioner

## 2021-04-24 ENCOUNTER — Ambulatory Visit: Payer: Medicare PPO | Admitting: Nurse Practitioner

## 2021-05-14 ENCOUNTER — Other Ambulatory Visit: Payer: Self-pay | Admitting: Nurse Practitioner

## 2021-05-14 DIAGNOSIS — G620 Drug-induced polyneuropathy: Secondary | ICD-10-CM

## 2021-05-14 DIAGNOSIS — T451X5A Adverse effect of antineoplastic and immunosuppressive drugs, initial encounter: Secondary | ICD-10-CM

## 2021-05-14 NOTE — Telephone Encounter (Signed)
Requested medications are due for refill today.  Very early last refill 11/07/2020 #270 with 4 refills  Requested medications are on the active medications list.  yes  Last refill. 11/07/2020  Future visit scheduled.   yes  Notes to clinic.  Medication not delegated.

## 2021-05-15 ENCOUNTER — Other Ambulatory Visit: Payer: Self-pay

## 2021-05-15 ENCOUNTER — Ambulatory Visit: Payer: Medicare PPO | Admitting: Nurse Practitioner

## 2021-05-15 ENCOUNTER — Encounter: Payer: Self-pay | Admitting: Nurse Practitioner

## 2021-05-15 VITALS — BP 124/70 | HR 66 | Temp 98.4°F | Wt 225.2 lb

## 2021-05-15 DIAGNOSIS — Z23 Encounter for immunization: Secondary | ICD-10-CM

## 2021-05-15 DIAGNOSIS — G8929 Other chronic pain: Secondary | ICD-10-CM | POA: Insufficient documentation

## 2021-05-15 DIAGNOSIS — E785 Hyperlipidemia, unspecified: Secondary | ICD-10-CM

## 2021-05-15 DIAGNOSIS — I152 Hypertension secondary to endocrine disorders: Secondary | ICD-10-CM

## 2021-05-15 DIAGNOSIS — M79672 Pain in left foot: Secondary | ICD-10-CM | POA: Insufficient documentation

## 2021-05-15 DIAGNOSIS — E1169 Type 2 diabetes mellitus with other specified complication: Secondary | ICD-10-CM | POA: Diagnosis not present

## 2021-05-15 DIAGNOSIS — R7989 Other specified abnormal findings of blood chemistry: Secondary | ICD-10-CM

## 2021-05-15 DIAGNOSIS — M5442 Lumbago with sciatica, left side: Secondary | ICD-10-CM

## 2021-05-15 DIAGNOSIS — M25572 Pain in left ankle and joints of left foot: Secondary | ICD-10-CM

## 2021-05-15 DIAGNOSIS — E1159 Type 2 diabetes mellitus with other circulatory complications: Secondary | ICD-10-CM

## 2021-05-15 LAB — BAYER DCA HB A1C WAIVED: HB A1C (BAYER DCA - WAIVED): 6 % — ABNORMAL HIGH (ref 4.8–5.6)

## 2021-05-15 NOTE — Assessment & Plan Note (Signed)
Ongoing since July.  Seeing chiropractor and obtaining massages with some benefit.  Will obtain imaging of lower back.  Discussed options, would prefer to avoid Prednisone taper.  Will place PT referral to assess gait and work on therapy for back pain.  Return in 4 weeks for follow-up.  If ongoing would consider referral to orthopedics.

## 2021-05-15 NOTE — Assessment & Plan Note (Addendum)
Chronic ongoing, stable BP, continue medication regimen and follow up with cardiology.BMP today. If kidney function continues to decline will consult with cardiology re initiating an ACE/ARB

## 2021-05-15 NOTE — Assessment & Plan Note (Signed)
Chronic, ongoing, she feels this is worsening and having more gait issues.  Will place referral to PT for further assessment and consider ortho referral in future.  Return in 4 weeks.

## 2021-05-15 NOTE — Assessment & Plan Note (Signed)
Last level in June was 1.4, patient states usually hang around that range. Continue amiloride and  Follow up with nephrology. Magnesium rechecked today

## 2021-05-15 NOTE — Progress Notes (Signed)
BP 124/70   Pulse 66   Temp 98.4 F (36.9 C) (Oral)   Wt 225 lb 3.2 oz (102.2 kg)   SpO2 98%   BMI 39.89 kg/m    Subjective:    Patient ID: Gwenlyn Found, female    DOB: 12/23/1950, 70 y.o.   MRN: [id number]  Chief Complaint  Patient presents with   Diabetes    Patient states she is having issues with her level spiking every night when her taking the new medication. Patient states she has been on the new medication for a couple of months. Patient states she is concerned about her A1c at today's visit.   Hyperlipidemia   Hypertension   Magnesium Level   Sciatica    Patient states she is having some issues with her sciatica. Patient states it has gotten better a little today and states she has days where there is worse than others. Patient states it has been hanging on for a couple of months and states she was unable to sing in the choir Sunday due to the pain.    Foot Problem    Patient states her L foot is starting to turn again and collapse again. Patient states she has had a few operations on her L foot.    NOTE WRITTEN BY DNP STUDENT.  ASSESSMENT AND PLAN OF CARE REVIEWED WITH STUDENT, AGREE WITH ABOVE FINDINGS AND PLAN.   HPI:  Diabetes LINZEY RAMSER is a 70 y.o. female, here today for a follow up, concerned that home blood sugar has been running between 130-140 with the Jardiance and thinks this is not as effective as the Metformin she was previously on, sugar at that time was in the 120 range. A1c today is 6.0%. Magnesium level is usually on the lower end, last Mg in June was 1.4. TSH was also elevated back in June.  DIABETES Hypoglycemic episodes:no Polydipsia/polyuria: no Visual disturbance: no Chest pain: no Paresthesias: no Glucose Monitoring: yes  Accucheck frequency: Daily  Fasting glucose: 130-140 range she reports  Post prandial:  Evening:  Before meals: Taking Insulin?: no  Long acting insulin:  Short acting insulin: Blood Pressure Monitoring: not  checking Retinal Examination: Up to Date Foot Exam: Up to Date Diabetic Education: Completed Pneumovax: Up to Date Influenza: Up to Date Aspirin: no  HYPERTENSION / [place] Satisfied with current treatment? yes Duration of hypertension: chronic BP monitoring frequency: not checking BP range:  BP medication side effects: no Past BP meds: atenolol Duration of hyperlipidemia: chronic Cholesterol medication side effects: yes Cholesterol supplements: none Past cholesterol medications: atorvastatin Medication compliance: good compliance Aspirin: no Recent stressors: no Recurrent headaches: no Visual changes: no Palpitations: no Dyspnea: no Chest pain: no Lower extremity edema: no Dizzy/lightheaded: no  BACK PAIN Patient stated that since July she has been having intermittent lower back pain and thinks this is related to her sciatica.  This past weekend pain was 10/10, took ibuprofen with relief. Pain wakes her up at nights occasionally. Today pain is 6/10. Pain goes to left foot but it is bearable Duration: months Mechanism of injury: no trauma Location: lower back Onset: gradual Severity: 6/10 Quality: aching Frequency: intermittent Radiation: L leg below the knee Aggravating factors: none Alleviating factors: NSAIDs Status: stable Treatments attempted: ibuprofen  Relief with NSAIDs?: significant Nighttime pain:  yes Paresthesias / decreased sensation:  no Bowel / bladder incontinence:  no Fevers:  no Dysuria / urinary frequency:  no      FOOT PAIN Concerned  that left foot is turning outwards and is worried about falling. States left ankle stays swollen as a result of previous orthopedic surgery. Pain to foot is bearable.  Duration: months Involved foot: left Mechanism of injury: unknown Location:  Onset: gradual  Severity: mild  Quality:  aching Frequency: intermittent Radiation: no Aggravating factors: none  Alleviating factors: NSAIDs  Status:  stable Treatments attempted: ibuprofen  Relief with NSAIDs?:  significant Weakness with weight bearing or walking: no Morning stiffness: no Swelling: no Redness: no Bruising: no Paresthesias / decreased sensation: no  Fevers:no     ROS General: patient denies fatigue, fever or chills Respiratory: denies shortness of breath CV: denies chest pain, palpitation GI: denies nausea, vomiting, diarrhea, constipation, change in appetite Endocrine: denies hot/cold intolerance, polyuria/polydipsia Neurologic: numbness/tingling to upper extremities, patient states it is due to her cancer treatment  Relevant past medical, surgical, family and social history reviewed and updated as indicated. Interim medical history since our last visit reviewed. Allergies and medications reviewed and updated.  Per HPI unless specifically indicated above     Objective:    BP 124/70   Pulse 66   Temp 98.4 F (36.9 C) (Oral)   Wt 225 lb 3.2 oz (102.2 kg)   SpO2 98%   BMI 39.89 kg/m   Wt Readings from Last 3 Encounters:  05/15/21 225 lb 3.2 oz (102.2 kg)  03/05/21 227 lb 3.2 oz (103.1 kg)  02/15/21 227 lb 3.2 oz (103.1 kg)    Physical Exam Constitutional:      Appearance: Normal appearance. She is well-developed. She is obese.  HENT:     Head: Normocephalic.     Right Ear: Tympanic membrane normal.     Left Ear: Tympanic membrane normal.  Eyes:     General: Lids are normal.  Neck:     Thyroid: No thyroid mass, thyromegaly or thyroid tenderness.     Vascular: No carotid bruit.  Cardiovascular:     Rate and Rhythm: Normal rate and regular rhythm.     Heart sounds: Normal heart sounds.  Pulmonary:     Effort: Pulmonary effort is normal.     Breath sounds: Normal breath sounds.  Abdominal:     General: A surgical scar is present. Bowel sounds are normal. There is no distension.     Palpations: Abdomen is soft.     Tenderness: There is no abdominal tenderness.  Musculoskeletal:     Cervical  back: Normal.     Thoracic back: Normal.     Lumbar back: Normal.  Lymphadenopathy:     Cervical: No cervical adenopathy.     Right cervical: No superficial, deep or posterior cervical adenopathy.    Left cervical: No superficial, deep or posterior cervical adenopathy.  Skin:    General: Skin is warm.     Findings: No erythema or rash.  Neurological:     General: No focal deficit present.     Mental Status: She is alert and oriented to person, place, and time.  Psychiatric:        Behavior: Behavior is cooperative.   Diabetic Foot Exam - Simple   Simple Foot Form Diabetic Foot exam was performed with the following findings: Yes 05/15/2021  9:00 AM  Visual Inspection No deformities, no ulcerations, no other skin breakdown bilaterally: Yes Sensation Testing Intact to touch and monofilament testing bilaterally: Yes Pulse Check Posterior Tibialis and Dorsalis pulse intact bilaterally: Yes Comments    Results for orders placed or performed in visit  on 02/07/21  Bayer DCA Hb A1c Waived  Result Value Ref Range   HB A1C (BAYER DCA - WAIVED) 6.1 <1.9 %  Basic metabolic panel  Result Value Ref Range   Glucose 109 (H) 65 - 99 mg/dL   BUN 21 8 - 27 mg/dL   Creatinine, Ser 1.10 (H) 0.57 - 1.00 mg/dL   eGFR 54 (L) >59 mL/min/1.73   BUN/Creatinine Ratio 19 12 - 28   Sodium 141 134 - 144 mmol/L   Potassium 5.1 3.5 - 5.2 mmol/L   Chloride 104 96 - 106 mmol/L   CO2 22 20 - 29 mmol/L   Calcium 9.4 8.7 - 10.3 mg/dL  TSH  Result Value Ref Range   TSH 4.730 (H) 0.450 - 4.500 uIU/mL  T4, free  Result Value Ref Range   Free T4 1.21 0.82 - 1.77 ng/dL  VITAMIN D 25 Hydroxy (Vit-D Deficiency, Fractures)  Result Value Ref Range   Vit D, 25-Hydroxy 33.2 30.0 - 100.0 ng/mL  Vitamin B12  Result Value Ref Range   Vitamin B-12 475 232 - 1,245 pg/mL  Magnesium  Result Value Ref Range   Magnesium 1.4 (L) 1.6 - 2.3 mg/dL      Assessment & Plan:   Problem List Items Addressed This Visit        Cardiovascular and Mediastinum   Hypertension associated with diabetes ([place])    Chronic ongoing, stable BP, continue medication regimen and follow up with cardiology.BMP today. If kidney function continues to decline will consult with cardiology re initiating an ACE/ARB        Endocrine   Diabetes mellitus associated with hormonal etiology ([place])    Chronic ongoing, current A1c is 6.0%, discussed with patient importance of eating a healthy meal.  Continue taking medication and blood sugar checks at home      Relevant Orders   Bayer DCA Hb A1c Waived   Basic metabolic panel   Hyperlipidemia associated with type 2 diabetes mellitus ([place])    Chronic ongoing, continue with medication regimen, no issues today.         Nervous and Auditory   Chronic left-sided low back pain with left-sided sciatica    Ongoing since July.  Seeing chiropractor and obtaining massages with some benefit.  Will obtain imaging of lower back.  Discussed options, would prefer to avoid Prednisone taper.  Will place PT referral to assess gait and work on therapy for back pain.  Return in 4 weeks for follow-up.  If ongoing would consider referral to orthopedics.        Other   Hypomagnesemia    Last level in June was 1.4, patient states usually hang around that range. Continue amiloride and  Follow up with nephrology. Magnesium rechecked today      Relevant Orders   Magnesium   Chronic pain of left ankle    Chronic, ongoing, she feels this is worsening and having more gait issues.  Will place referral to PT for further assessment and consider ortho referral in future.  Return in 4 weeks.      Elevated TSH - Primary    TSH in June elevated, no symptoms today. Recheck today, if same continues to be out of range discussed starting medication. Per guidelines of the thyroid society will monitor and if TSH elevates >6 will initiate medication.      Relevant Orders   T4, free   TSH   RESOLVED: Left foot pain    Other Visit Diagnoses  Flu vaccine need       Relevant Orders   Flu Vaccine QUAD High Dose(Fluad) (Completed)        Follow up plan: Return in about 4 weeks (around 06/12/2021) for Back Pain.

## 2021-05-15 NOTE — Patient Instructions (Signed)
Diabetes Mellitus and Nutrition, Adult When you have diabetes, or diabetes mellitus, it is very important to have healthy eating habits because your blood sugar (glucose) levels are greatly affected by what you eat and drink. Eating healthy foods in the right amounts, at about the same times every day, can help you:  Control your blood glucose.  Lower your risk of heart disease.  Improve your blood pressure.  Reach or maintain a healthy weight. What can affect my meal plan? Every person with diabetes is different, and each person has different needs for a meal plan. Your health care provider may recommend that you work with a dietitian to make a meal plan that is best for you. Your meal plan may vary depending on factors such as:  The calories you need.  The medicines you take.  Your weight.  Your blood glucose, blood pressure, and cholesterol levels.  Your activity level.  Other health conditions you have, such as heart or kidney disease. How do carbohydrates affect me? Carbohydrates, also called carbs, affect your blood glucose level more than any other type of food. Eating carbs naturally raises the amount of glucose in your blood. Carb counting is a method for keeping track of how many carbs you eat. Counting carbs is important to keep your blood glucose at a healthy level, especially if you use insulin or take certain oral diabetes medicines. It is important to know how many carbs you can safely have in each meal. This is different for every person. Your dietitian can help you calculate how many carbs you should have at each meal and for each snack. How does alcohol affect me? Alcohol can cause a sudden decrease in blood glucose (hypoglycemia), especially if you use insulin or take certain oral diabetes medicines. Hypoglycemia can be a life-threatening condition. Symptoms of hypoglycemia, such as sleepiness, dizziness, and confusion, are similar to symptoms of having too much  alcohol.  Do not drink alcohol if: ? Your health care provider tells you not to drink. ? You are pregnant, may be pregnant, or are planning to become pregnant.  If you drink alcohol: ? Do not drink on an empty stomach. ? Limit how much you use to:  0-1 drink a day for women.  0-2 drinks a day for men. ? Be aware of how much alcohol is in your drink. In the [place] one drink equals one 12 oz bottle of beer (355 mL), one 5 oz glass of wine (148 mL), or one 1 oz glass of hard liquor (44 mL). ? Keep yourself hydrated with water, diet soda, or unsweetened iced tea.  Keep in mind that regular soda, juice, and other mixers may contain a lot of sugar and must be counted as carbs. What are tips for following this plan? Reading food labels  Start by checking the serving size on the "Nutrition Facts" label of packaged foods and drinks. The amount of calories, carbs, fats, and other nutrients listed on the label is based on one serving of the item. Many items contain more than one serving per package.  Check the total grams (g) of carbs in one serving. You can calculate the number of servings of carbs in one serving by dividing the total carbs by 15. For example, if a food has 30 g of total carbs per serving, it would be equal to 2 servings of carbs.  Check the number of grams (g) of saturated fats and trans fats in one serving. Choose foods that have   a low amount or none of these fats.  Check the number of milligrams (mg) of salt (sodium) in one serving. Most people should limit total sodium intake to less than 2,300 mg per day.  Always check the nutrition information of foods labeled as "low-fat" or "nonfat." These foods may be higher in added sugar or refined carbs and should be avoided.  Talk to your dietitian to identify your daily goals for nutrients listed on the label. Shopping  Avoid buying canned, pre-made, or processed foods. These foods tend to be high in fat, sodium, and added  sugar.  Shop around the outside edge of the grocery store. This is where you will most often find fresh fruits and vegetables, bulk grains, fresh meats, and fresh dairy. Cooking  Use low-heat cooking methods, such as baking, instead of high-heat cooking methods like deep frying.  Cook using healthy oils, such as olive, canola, or sunflower oil.  Avoid cooking with butter, cream, or high-fat meats. Meal planning  Eat meals and snacks regularly, preferably at the same times every day. Avoid going long periods of time without eating.  Eat foods that are high in fiber, such as fresh fruits, vegetables, beans, and whole grains. Talk with your dietitian about how many servings of carbs you can eat at each meal.  Eat 4-6 oz (112-168 g) of lean protein each day, such as lean meat, chicken, fish, eggs, or tofu. One ounce (oz) of lean protein is equal to: ? 1 oz (28 g) of meat, chicken, or fish. ? 1 egg. ?  cup (62 g) of tofu.  Eat some foods each day that contain healthy fats, such as avocado, nuts, seeds, and fish.   What foods should I eat? Fruits Berries. Apples. Oranges. Peaches. Apricots. Plums. Grapes. Mango. Papaya. Pomegranate. Kiwi. Cherries. Vegetables Lettuce. Spinach. Leafy greens, including kale, chard, collard greens, and mustard greens. Beets. Cauliflower. Cabbage. Broccoli. Carrots. Green beans. Tomatoes. Peppers. Onions. Cucumbers. Brussels sprouts. Grains Whole grains, such as whole-wheat or whole-grain bread, crackers, tortillas, cereal, and pasta. Unsweetened oatmeal. Quinoa. Brown or wild rice. Meats and other proteins Seafood. Poultry without skin. Lean cuts of poultry and beef. Tofu. Nuts. Seeds. Dairy Low-fat or fat-free dairy products such as milk, yogurt, and cheese. The items listed above may not be a complete list of foods and beverages you can eat. Contact a dietitian for more information. What foods should I avoid? Fruits Fruits canned with  syrup. Vegetables Canned vegetables. Frozen vegetables with butter or cream sauce. Grains Refined white flour and flour products such as bread, pasta, snack foods, and cereals. Avoid all processed foods. Meats and other proteins Fatty cuts of meat. Poultry with skin. Breaded or fried meats. Processed meat. Avoid saturated fats. Dairy Full-fat yogurt, cheese, or milk. Beverages Sweetened drinks, such as soda or iced tea. The items listed above may not be a complete list of foods and beverages you should avoid. Contact a dietitian for more information. Questions to ask a health care provider  Do I need to meet with a diabetes educator?  Do I need to meet with a dietitian?  What number can I call if I have questions?  When are the best times to check my blood glucose? Where to find more information:  American Diabetes Association: diabetes.org  Academy of Nutrition and Dietetics: www.eatright.org  National Institute of Diabetes and Digestive and Kidney Diseases: www.niddk.nih.gov  Association of Diabetes Care and Education Specialists: www.diabeteseducator.org Summary  It is important to have healthy eating   habits because your blood sugar (glucose) levels are greatly affected by what you eat and drink.  A healthy meal plan will help you control your blood glucose and maintain a healthy lifestyle.  Your health care provider may recommend that you work with a dietitian to make a meal plan that is best for you.  Keep in mind that carbohydrates (carbs) and alcohol have immediate effects on your blood glucose levels. It is important to count carbs and to use alcohol carefully. This information is not intended to replace advice given to you by your health care provider. Make sure you discuss any questions you have with your health care provider. Document Revised: 07/20/2019 Document Reviewed: 07/20/2019 Elsevier Patient Education  2021 [employer] Inc.  

## 2021-05-15 NOTE — Assessment & Plan Note (Addendum)
TSH in June elevated, no symptoms today. Recheck today, if same continues to be out of range discussed starting medication. Per guidelines of the thyroid society will monitor and if TSH elevates >6 will initiate medication.

## 2021-05-15 NOTE — Assessment & Plan Note (Signed)
Chronic ongoing, current A1c is 6.0%, discussed with patient importance of eating a healthy meal.  Continue taking medication and blood sugar checks at home

## 2021-05-15 NOTE — Assessment & Plan Note (Signed)
Chronic ongoing, continue with medication regimen, no issues today.

## 2021-05-16 ENCOUNTER — Ambulatory Visit
Admission: RE | Admit: 2021-05-16 | Discharge: 2021-05-16 | Disposition: A | Discharge status: Home / Self Care | Payer: Medicare PPO | Source: Ambulatory Visit | Attending: Nurse Practitioner | Admitting: Nurse Practitioner

## 2021-05-16 ENCOUNTER — Ambulatory Visit
Admission: RE | Admit: 2021-05-16 | Discharge: 2021-05-16 | Disposition: A | Discharge status: Home / Self Care | Payer: Medicare PPO | Attending: Nurse Practitioner | Admitting: Nurse Practitioner

## 2021-05-16 DIAGNOSIS — G8929 Other chronic pain: Secondary | ICD-10-CM | POA: Diagnosis present

## 2021-05-16 DIAGNOSIS — M5442 Lumbago with sciatica, left side: Secondary | ICD-10-CM | POA: Diagnosis present

## 2021-05-16 DIAGNOSIS — M79672 Pain in left foot: Secondary | ICD-10-CM | POA: Diagnosis present

## 2021-05-16 LAB — BASIC METABOLIC PANEL
BUN/Creatinine Ratio: 24 (ref 12–28)
BUN: 26 mg/dL (ref 8–27)
CO2: 22 mmol/L (ref 20–29)
Calcium: 9.6 mg/dL (ref 8.7–10.3)
Chloride: 107 mmol/L — ABNORMAL HIGH (ref 96–106)
Creatinine, Ser: 1.08 mg/dL — ABNORMAL HIGH (ref 0.57–1.00)
Glucose: 129 mg/dL — ABNORMAL HIGH (ref 65–99)
Potassium: 4.4 mmol/L (ref 3.5–5.2)
Sodium: 146 mmol/L — ABNORMAL HIGH (ref 134–144)
eGFR: 55 mL/min/{1.73_m2} — ABNORMAL LOW (ref >59)

## 2021-05-16 LAB — T4, FREE: Free T4: 1.08 ng/dL (ref 0.82–1.77)

## 2021-05-16 LAB — MAGNESIUM: Magnesium: 1.9 mg/dL (ref 1.6–2.3)

## 2021-05-16 LAB — TSH: TSH: 3.4 u[IU]/mL (ref 0.450–4.500)

## 2021-05-16 NOTE — Progress Notes (Signed)
Contacted via MyChart   Good afternoon [name], your labs have returned: - First off your mag level is 1.9!!  It is normal right now, perhaps the Vania Rea is offering benefit here, I was worried it may not, but it may be.  Great news!! - Kidney function continues to show baseline kidney disease with no worsening, we will continue to monitor this.  Sodium, salt, is a little high work on getting more water and less salt intake daily and we will recheck next visit. - Thyroid labs normal this check, no medication needed at this time.  Any questions? Keep being amazing!!  Thank you for allowing me to participate in your care.  I appreciate you. Kindest regards, Zina Pitzer

## 2021-05-18 NOTE — Progress Notes (Signed)
Contacted via MyChart   Good morning [name], you imaging has returned.  Lumbar spine is showing some degenerative changes which could be causing some of this discomfort + is showing some osteopenia (thinning of the bone) but no osteoporosis (brittle bone) -- your last DEXA (bone) scan was normal, may be good to repeat this now that we see this finding.  Ankle imaging also showing some degenerative changes.  So arthritis issues with back and ankle.  Definitely try the physical therapy, as often this is beneficial for this.  If no benefit then we may send you to ortho.  Any questions? Keep being amazing!!  Thank you for allowing me to participate in your care.  I appreciate you. Kindest regards, Eluzer Howdeshell

## 2021-05-22 ENCOUNTER — Telehealth: Payer: Self-pay

## 2021-05-22 NOTE — Telephone Encounter (Signed)
Attempted to reach patient to have her follow up on lab results, but no answer or voicemail available.

## 2021-05-29 ENCOUNTER — Ambulatory Visit: Payer: Medicare PPO | Attending: Nurse Practitioner | Admitting: Physical Therapy

## 2021-05-29 ENCOUNTER — Encounter: Payer: Self-pay | Admitting: Physical Therapy

## 2021-05-29 DIAGNOSIS — G8929 Other chronic pain: Secondary | ICD-10-CM | POA: Diagnosis present

## 2021-05-29 DIAGNOSIS — M5442 Lumbago with sciatica, left side: Secondary | ICD-10-CM | POA: Insufficient documentation

## 2021-05-29 NOTE — Therapy (Signed)
[facility] [address], [place], [address] Phone: [phone]   Fax:  [phone]  Physical Therapy Evaluation  Patient Details  Name: [name] MRN: [id number] Date of Birth: 10-22-1950 No data recorded  Encounter Date: 05/29/2021   PT End of Session - 05/29/21 1444     Visit Number 1    Number of Visits 17    Date for PT Re-Evaluation 07/27/21    Authorization - Visit Number 1    Authorization - Number of Visits 10    PT Start Time 1350    PT Stop Time 1430    PT Time Calculation (min) 40 min    Activity Tolerance Patient tolerated treatment well    Behavior During Therapy [facility] for tasks assessed/performed             Past Medical History:  Diagnosis Date   Arthritis    Breast cancer, left ([place]) 09/2007   s/p lumpectomy and chemoradiation   Cardiomyopathy secondary    GERD (gastroesophageal reflux disease)    Glaucoma    History of first degree AV block    high grade; PPM placed   History of kidney stones    Hypersomnolence 09/21/2013   Hypertension    Hypothyroidism    no meds currently   Leg edema, left 09/21/2013   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   Migraines    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   Ovarian cyst 11/06/2020   Pacemaker [employer] 2004   Personal history of chemotherapy 2009   left breast ca   Personal history of radiation therapy 2009   left breast   Sinus tachycardia    Type 2 diabetes mellitus ([place]) 06/19/2015    Past Surgical History:  Procedure Laterality Date   ANKLE SURGERY Left    x 3   BREAST EXCISIONAL BIOPSY Left 2009    Stage IIa, ER/PR negative, 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   Subjective Assessment - 05/29/21 1354     Pertinent History Patient is a 70 year old female presenting with L sided LBP. Reports LBP is chronic, with recent exacerbation in July 2022 after driving several hours. Reports while she was driving back from [place] she had radiating pain down LLE down her ankle and has been struggling with this sided pain with redicular symptoms since. Patinet reports pain is constant in L side of back and buttock, with pain down post LLE 50% time brought on by walking >22mns, sitting with her knees bent. Her back pain is made worse by standing >280ms, lifting, stairs (has stairs to enter home with unilateral handrail, has second story she does not need to utilize) and at  end of the day. Current pain 6/10; best 0/10 after tylenol; worst 10/10. 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 She enjoys cooking, reading, singing at her church, and Company secretary. She has trouble sitting and standing for prolonged periods for cooking and painting. Reports there are times singing for church she has to offset the LLE and stand perdominatly RLE. History of L ankle pain with arthritis contributing to walking pain. Pt denies N/V, B&B changes, unexplained weight fluctuation, saddle paresthesia, fever, night sweats, or unrelenting night pain at this time. No falls in past 52month, not afraid to fall.    Limitations Lifting;Walking;Sitting;Standing;House hold activities    How  long can you sit comfortably? >1hour in comfy chair; >169ms in hard chair    How long can you stand comfortably? >3071m   How long can you walk comfortably? >4m1m  Diagnostic tests Xray    Currently in Pain? Yes    Pain Score 6     Pain Location Back    Pain Orientation Left    Pain Descriptors / Indicators Aching;Pins and needles    Pain Type Chronic pain    Pain Radiating Towards Down post LLE to foot    Pain Onset More than a month ago    Pain Frequency Constant    Aggravating Factors  sitting, standing, walking long periods, lifting, bending, pulling    Pain Relieving Factors tylenol    Effect of Pain on Daily Activities unable to complete job duties, paitPsychologist, educationaloking, ADLs             OBJECTIVE  Mental Status Patient is oriented to person, place and time.  Recent memory is intact.  Remote memory is intact.  Attention span and concentration are intact.  Expressive speech is intact.  Patient's fund of knowledge is within normal limits for educational level.  SENSATION: Grossly intact to light touch bilateral LEs as determined by testing dermatomes L2-S2 Proprioception and hot/cold testing deferred on this date   MUSCULOSKELETAL: Tremor: None Bulk: Normal Tone: Normal No visible step-off along spinal column  Posture R lateral shift with increased RLE wt bearing, increased lumbar lordosis, slight forward flex, bilat ankle ev with bilat hip ER Iliac crest height: Increased on R Lower crossed syndrome (tight hip flexors and erector spinae; weak gluts and abs): positive  Gait Minimal wt acceptance (pt reports d/t sciatica and L ankle pain) decreased R step with near step to pattern, decreased LLE stance time, forward flexed when attempting "fast" gait. Decreased speed overall   Palpation  TTP with concordant sign and radiation of sciatic pain with deep palpation over glute max over piriformis. No TTP to superior glute fibers. Trigger points of mid L glute  musculature, latent trigger points to bilat lumbar paraspinals and QL.   Strength (out of 5) R/L 4+/3 Hip flexion 4/4+ Hip ER 5/5 Hip IR (within minimal available range) 4+/4 Hip abduction 5/5 Hip adduction 4-/2+ Hip extension 5/5 Ankle dorsiflexion 5 Trunk flexion 5 Trunk extension 5/5 Trunk rotation  *Indicates pain   AROM (degrees) R/L (all movements include overpressure unless otherwise stated) Lumbar forward flexion (65): WNL bilat Lumbar extension (30): 25% limited Lumbar lateral flexion (25): WNL bilat Thoracic and Lumbar rotation (30 degrees):  WNL bilat Hip IR (0-45): approx 20d bilat Hip ER (0-45): WNL bilat Hip Flexion (0-125): R: WNL L: 90d Hip Abduction (0-40): WNL bilat Hip extension (0-15):  R: WNL L: 10d approx *Indicates pain   PROM (degrees) All passive motions WNL accept bilat hip IR which is severely limited bilat  Repeated Movements Centralization with seated ext   Muscle Length Hamstrings: shortened bilat E[place]: WNL Thomas:  Not formally tested, evidence of tension in standing ad gait Ober: WNL bilat   Passive Accessory Intervertebral Motion (PAIVM) Reproduction of back pain with CPA L3-L5; some pain relief with continued mobs and UPA bilaterally L1-L5. Generally hypomobile throughout  Passive Physiological Intervertebral Motion (PPIVM) Normal flexion and extension with PPIVM testing   SPECIAL TESTS Lumbar Radiculopathy and Discogenic: Centralization and Peripheralization (SN 92, -LR 0.12): Positive Slump (SN 83, -LR 0.32): R: Negative L: Positive SLR (SN 92, -LR 0.29): R: Negative L:  Positive Crossed SLR (SP 90): R: Negative L: Positive  Facet Joint: Extension-Rotation (SN 100, -LR 0.0): R: Positive L: Negative  Lumbar Foraminal Stenosis: Lumbar quadrant (SN 70): R: Negative L: Negative  Hip: FABER (SN 81): R: Negative L: Negative FADIR (SN 94): R: Positive L: Positive Hip scour (SN 50): R: Negative L: Negative  SIJ:  Thigh  Thrust (SN 88, -LR 0.18) : R: Negative L: Negative  Piriformis Syndrome: FAIR Test (SN 88, SP 83): R: Negative L: Positive  Functional Tasks Sit to stand multiple attempts with momentum used for initiation   5xSTS 36sec 10MWT SS: 0.7ms Fastest 0.92m  Ther-Ex PT reviewed the following HEP with patient with patient able to demonstrate a set of the following with min cuing for correction needed. PT educated patient on parameters of therex (how/when to inc/decrease intensity, frequency, rep/set range, stretch hold time, and purpose of therex) with verbalized understanding.   Access Code: [email or web address] Standing Lumbar Extension - 8 x daily - 7 x weekly - 12 reps Seated Piriformis Stretch - 3-5 x daily - 7 x weekly - 30sec hold Sit to Stand Without Arm Support - 1 x daily - 2-3 x weekly - 3 sets - 8-10 reps                                  PT Education - 05/29/21 1443     Education Details Patient was educated on diagnosis, anatomy and pathology involved, prognosis, role of PT, and was given an HEP, demonstrating exercise with proper form following verbal and tactile cues, and was given a paper hand out to continue exercise at home. Pt was educated on and agreed to plan of care.    Person(s) Educated Patient    Methods Explanation;Demonstration;Tactile cues;Verbal cues    Comprehension Verbalized understanding;Returned demonstration;Verbal cues required              PT Short Term Goals - 05/29/21 1526       PT SHORT TERM GOAL #1   Title Pt will be independent with HEP in order to improve strength and balance in order to decrease fall risk and improve function at home and work.    Baseline 05/29/21 HEP given    Time 4    Period Weeks    Status New      PT SHORT TERM GOAL #2   Title Pt will decrease 5TSTS by at least 3 seconds in order to demonstrate clinically significant improvement in LE strength    Baseline 05/29/21 36sec    Time 4    Period Weeks     Status New  PT Long Term Goals - 05/29/21 1527       PT LONG TERM GOAL #1   Title Pt will decrease 5TSTS to at least 12 seconds in order to demonstrate clinically significant improvement in LE strength    Baseline 05/29/21 36sec    Time 8    Period Weeks    Status New      PT LONG TERM GOAL #2   Title Pt will increase 10MWT by at least 0.13 m/s in order to demonstrate clinically significant improvement in community ambulation.    Baseline 05/29/21 SS: 0.35ms Fastest 0.982m    Time 8    Period Weeks    Status New      PT LONG TERM GOAL #3   Title Pt will decrease worst pain as reported on NPRS by at least 3 points in order to demonstrate clinically significant reduction in pain.    Baseline 05/29/21 10/10    Time 8    Period Weeks    Status New      PT LONG TERM GOAL #4   Title Patient will increase FOTO score to 63 to demonstrate predicted increase in functional mobility to complete ADLs    Baseline 05/29/21 47    Time 8    Period Weeks    Status New                   Plan - 05/29/21 1509     Clinical Impression Statement Pt is a [age]/o female presenting with signs and symptoms of lumbar rediculopathy with L sided sciatica. Impairments in increased tension and trigger points of L glute musculature, decreased core strength, decreased hip strength L>R, pain, antalgic gait, abnormal posture, and decreased balance. Activtiy limitations in prolonged sitting, standing, walking, lifting, and stair negotiation; inhibiting full participation in ADLs and community activity. Pt will benefit from skilled PT to address impairments to return to optimal PLOF.    Personal Factors and Comorbidities Age;Comorbidity 3+;Fitness;Past/Current Experience    Comorbidities HTN, GERD, arthritis, chronic LBP, DM2    Examination-Activity Limitations Lift;Carry;Sit;Stand;Squat;Stairs;Transfers    Examination-Participation Restrictions Cleaning;Community  Activity;Laundry;Driving    Stability/Clinical Decision Making Evolving/Moderate complexity    Clinical Decision Making Moderate    Rehab Potential Good    PT Frequency 2x / week    PT Duration 2 weeks    PT Treatment/Interventions ADLs/Self Care Home Management;Electrical Stimulation;Moist Heat;Traction;Cryotherapy;Ultrasound;Gait training;Stair training;Therapeutic activities;Therapeutic exercise;Neuromuscular re-education;Balance training;Patient/family education;Functional mobility training;Manual techniques;Passive range of motion;Dry needling;Joint Manipulations;Spinal Manipulations    PT Next Visit Plan SLS, repeated motion effectiveness, stair negotiation    PT Home Exercise Plan STS, piriformis stretch, repeated lumbar ext    Consulted and Agree with Plan of Care Patient             Patient will benefit from skilled therapeutic intervention in order to improve the following deficits and impairments:  Abnormal gait, Decreased activity tolerance, Decreased endurance, Decreased range of motion, Decreased strength, Increased fascial restricitons, Pain, Improper body mechanics, Decreased balance, Decreased mobility, Difficulty walking, Impaired flexibility, Postural dysfunction, Impaired tone  Visit Diagnosis: Chronic left-sided low back pain with left-sided sciatica     Problem List Patient Active Problem List   Diagnosis Date Noted   Chronic left-sided low back pain with left-sided sciatica 05/15/2021   S/P right oophorectomy 01/18/2021   Elevated TSH 12/14/2020   History of kidney stones 11/07/2020   Seasonal allergic rhinitis 08/11/2020   Chronic pain of left ankle 04/27/2020   Morbid obesity ([facility]  04/27/2020   B12 deficiency 09/28/2019   Advanced care planning/counseling discussion 09/09/2017   Hyperlipidemia associated with type 2 diabetes mellitus ([place]) 07/29/2016   Chemotherapy-induced neuropathy ([place]) 04/01/2016   History of breast cancer 10/02/2015   Hypertension  associated with diabetes ([place]) 06/19/2015   Diabetes mellitus associated with hormonal etiology ([place]) 06/19/2015   Hypomagnesemia 06/19/2015   AV BLOCK, 1ST DEGREE 07/02/2010   PPM-[employer] 01/13/2009   Durwin Reges DPT Durwin Reges, PT 05/29/2021, 3:37 PM  [facility] [address], [place], [address] Phone: [phone]   Fax:  [phone]  Name: [name] MRN: [id number] Date of Birth: June 01, 1951

## 2021-05-31 ENCOUNTER — Ambulatory Visit: Payer: Medicare PPO | Admitting: Physical Therapy

## 2021-05-31 DIAGNOSIS — G8929 Other chronic pain: Secondary | ICD-10-CM

## 2021-05-31 DIAGNOSIS — M5442 Lumbago with sciatica, left side: Secondary | ICD-10-CM | POA: Diagnosis not present

## 2021-05-31 NOTE — Therapy (Signed)
[facility] [address], [place], [address] Phone: [phone]   Fax:  [phone]  Physical Therapy Treatment  Patient Details  Name: [name] MRN: [id number] Date of Birth: 1951/07/19 No data recorded  Encounter Date: 05/31/2021   PT End of Session - 05/31/21 1655     Visit Number 2    Number of Visits 17    Date for PT Re-Evaluation 07/27/21    Authorization - Visit Number 2    Authorization - Number of Visits 10    PT Start Time 1030    PT Stop Time 1115    PT Time Calculation (min) 45 min    Activity Tolerance Patient tolerated treatment well    Behavior During Therapy [facility] for tasks assessed/performed             Past Medical History:  Diagnosis Date   Arthritis    Breast cancer, left ([place]) 09/2007   s/p lumpectomy and chemoradiation   Cardiomyopathy secondary    GERD (gastroesophageal reflux disease)    Glaucoma    History of first degree AV block    high grade; PPM placed   History of kidney stones    Hypersomnolence 09/21/2013   Hypertension    Hypothyroidism    no meds currently   Leg edema, left 09/21/2013   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   Migraines    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   Ovarian cyst 11/06/2020   Pacemaker [employer] 2004   Personal history of chemotherapy 2009   left breast ca   Personal history of radiation therapy 2009   left breast   Sinus tachycardia    Type 2 diabetes mellitus ([place]) 06/19/2015    Past Surgical History:  Procedure Laterality Date   ANKLE SURGERY Left    x 3   BREAST EXCISIONAL BIOPSY Left 2009    Stage IIa, ER/PR negative, 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   Subjective Assessment - 05/31/21 1032     Subjective Pt reports centralization with extension based exercise. She reports her current LBP pain as 8/10 on NPRS. She reports active participation in [place].    Pertinent History Patient is a 70 year old female presenting with L sided LBP. Reports LBP is chronic, with recent exacerbation in July 2022 after driving several hours. Reports while she was driving back from [place] she had radiating pain down LLE down her ankle and has been struggling with this sided pain with redicular symptoms since. Patinet reports pain is constant in L side of back and buttock, with pain down post LLE 50% time brought on by walking >63mns, sitting with her knees bent. Her back pain  is made worse by standing >63mns, lifting, stairs (has stairs to enter home with unilateral handrail, has second story she does not need to utilize) and at end of the day. Current pain 6/10; best 0/10 after tylenol; worst 10/10. 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 Reports there are times singing for church she has to offset the LLE and stand perdominatly RLE. History of L ankle pain with arthritis contributing to walking pain. Pt denies N/V, B&B changes, unexplained weight fluctuation, saddle paresthesia, fever, night sweats, or  unrelenting night pain at this time. No falls in past 653month not afraid to fall.    Limitations Lifting;Walking;Sitting;Standing;House hold activities    How long can you sit comfortably? >1hour in comfy chair; >1083m in hard chair    How long can you stand comfortably? >4m32m  How long can you walk comfortably? >20mi33m Diagnostic tests Xray            Therex  Nu Step L2 x 5 min LE only seat 7   Stair negotiation: single UE support reciprocal ascending; descending step to pattern with single UE support  Single leg stance: <5 sec bilat   Prone extension: 1 x 12 reps with vc and demonstration   Standing Lumbar Extension 1 x 10 reps   Glute Bridges 2 x 10 reps with cues to demonstration with good carry over   Sit to Stand 1 x 10 reps   Piriformis Stretch 2 x 30sec   Modified Bird dog with single leg lift 2 x 6 reps on each LE                         PT Education - 05/31/21 1251     Education Details therex form/technique    Person(s) Educated Patient    Methods Explanation;Demonstration    Comprehension Verbalized understanding;Returned demonstration              PT Short Term Goals - 05/29/21 1526       PT SHORT TERM GOAL #1   Title Pt will be independent with HEP in order to improve strength and balance in order to decrease fall risk and improve function at home and work.    Baseline 05/29/21 HEP given    Time 4    Period Weeks    Status New      PT SHORT TERM GOAL #2   Title Pt will decrease 5TSTS by at least 3 seconds in order to demonstrate clinically significant improvement in LE strength    Baseline 05/29/21 36sec    Time 4    Period Weeks    Status New               PT Long Term Goals - 05/29/21 1527       PT LONG TERM GOAL #1   Title Pt will decrease 5TSTS to at least 12 seconds in order to demonstrate clinically significant improvement in LE strength    Baseline 05/29/21 36sec    Time 8    Period Weeks     Status New      PT LONG TERM GOAL #2   Title Pt will increase 10MWT by at least 0.13 m/s in order  to demonstrate clinically significant improvement in community ambulation.    Baseline 05/29/21 SS: 0.39ms Fastest 0.922m    Time 8    Period Weeks    Status New      PT LONG TERM GOAL #3   Title Pt will decrease worst pain as reported on NPRS by at least 3 points in order to demonstrate clinically significant reduction in pain.    Baseline 05/29/21 10/10    Time 8    Period Weeks    Status New      PT LONG TERM GOAL #4   Title Patient will increase FOTO score to 63 to demonstrate predicted increase in functional mobility to complete ADLs    Baseline 05/29/21 47    Time 8    Period Weeks    Status New                   Plan - 05/31/21 1043     Clinical Impression Statement Pt tolerated session well with no reports of increased LBP or radicular symptoms throughout session. Pt responded well to repeated extention biases exercise. Pt also has continues to demonstrate decrease strength and WB tolerance on L LE evidenced by stair negotiation, SLS, and modified bird dog exercise. Patient will continue to benefit from skilled therapy for exercise performance to ensure proper technique for safe and effective progression.    Personal Factors and Comorbidities Age;Comorbidity 3+;Fitness;Past/Current Experience    Comorbidities HTN, GERD, arthritis, chronic LBP, DM2    Examination-Activity Limitations Lift;Carry;Sit;Stand;Squat;Stairs;Transfers    Examination-Participation Restrictions Cleaning;Community Activity;Laundry;Driving    Stability/Clinical Decision Making Evolving/Moderate complexity    Clinical Decision Making Moderate    Rehab Potential Good    PT Frequency 2x / week    PT Duration 2 weeks    PT Treatment/Interventions ADLs/Self Care Home Management;Electrical Stimulation;Moist Heat;Traction;Cryotherapy;Ultrasound;Gait training;Stair training;Therapeutic  activities;Therapeutic exercise;Neuromuscular re-education;Balance training;Patient/family education;Functional mobility training;Manual techniques;Passive range of motion;Dry needling;Joint Manipulations;Spinal Manipulations    PT Next Visit Plan LE/core strengthening    PT Home Exercise Plan STS, piriformis stretch, repeated lumbar ext    Consulted and Agree with Plan of Care Patient             Patient will benefit from skilled therapeutic intervention in order to improve the following deficits and impairments:  Abnormal gait, Decreased activity tolerance, Decreased endurance, Decreased range of motion, Decreased strength, Increased fascial restricitons, Pain, Improper body mechanics, Decreased balance, Decreased mobility, Difficulty walking, Impaired flexibility, Postural dysfunction, Impaired tone  Visit Diagnosis: Chronic left-sided low back pain with left-sided sciatica     Problem List Patient Active Problem List   Diagnosis Date Noted   Chronic left-sided low back pain with left-sided sciatica 05/15/2021   S/P right oophorectomy 01/18/2021   Elevated TSH 12/14/2020   History of kidney stones 11/07/2020   Seasonal allergic rhinitis 08/11/2020   Chronic pain of left ankle 04/27/2020   Morbid obesity ([place]/09/2019   B12 deficiency 09/28/2019   Advanced care planning/counseling discussion 09/09/2017   Hyperlipidemia associated with type 2 diabetes mellitus ([facility]12/11/2015   Chemotherapy-induced neuropathy ([facility]08/02/2016   History of breast cancer 10/02/2015   Hypertension associated with diabetes ([facility]10/24/2016   Diabetes mellitus associated with hormonal etiology (HCR[place]10/24/2016   Hypomagnesemia 06/19/2015   AV BLOCK, 1ST DEGREE 07/02/2010   PPM-[employer] 01/13/2009     ChDurwin RegesPT AuSharion SettlerSPT  ChDurwin RegesPT 05/31/2021, 4:58 PM  [facility]  [address] [place], [address] Phone: [phone]   Fax:  [phone]  Name: [name] MRN: [id number] Date of Birth: Sep 17, 1950

## 2021-06-05 ENCOUNTER — Encounter: Payer: Medicare PPO | Admitting: Physical Therapy

## 2021-06-07 ENCOUNTER — Encounter: Payer: Self-pay | Admitting: Physical Therapy

## 2021-06-07 ENCOUNTER — Telehealth: Payer: Self-pay

## 2021-06-07 ENCOUNTER — Ambulatory Visit: Payer: Medicare PPO | Admitting: Dermatology

## 2021-06-07 ENCOUNTER — Encounter: Payer: Medicare PPO | Admitting: Physical Therapy

## 2021-06-07 ENCOUNTER — Other Ambulatory Visit: Payer: Self-pay

## 2021-06-07 ENCOUNTER — Ambulatory Visit: Payer: Medicare PPO | Admitting: Physical Therapy

## 2021-06-07 DIAGNOSIS — L918 Other hypertrophic disorders of the skin: Secondary | ICD-10-CM

## 2021-06-07 DIAGNOSIS — L821 Other seborrheic keratosis: Secondary | ICD-10-CM

## 2021-06-07 DIAGNOSIS — L720 Epidermal cyst: Secondary | ICD-10-CM | POA: Diagnosis not present

## 2021-06-07 DIAGNOSIS — D229 Melanocytic nevi, unspecified: Secondary | ICD-10-CM

## 2021-06-07 DIAGNOSIS — M5442 Lumbago with sciatica, left side: Secondary | ICD-10-CM | POA: Diagnosis not present

## 2021-06-07 DIAGNOSIS — G8929 Other chronic pain: Secondary | ICD-10-CM

## 2021-06-07 DIAGNOSIS — L578 Other skin changes due to chronic exposure to nonionizing radiation: Secondary | ICD-10-CM

## 2021-06-07 NOTE — Therapy (Signed)
[facility] [address] [place], [place], [address] Phone: [phone]   Fax:  [phone]  Physical Therapy Treatment  Patient Details  Name: [name] MRN: [id number] Date of Birth: [birth year] No data recorded  Encounter Date: 06/07/2021   PT End of Session - 06/07/21 1202     Visit Number 3    Number of Visits 17    Date for PT Re-Evaluation 07/27/21    Authorization - Visit Number 3    Authorization - Number of Visits 10    PT Start Time 1120    PT Stop Time 7579    PT Time Calculation (min) 38 min    Activity Tolerance Patient tolerated treatment well    Behavior During Therapy [facility] for tasks assessed/performed             Past Medical History:  Diagnosis Date   Arthritis    Breast cancer, left ([place]) 09/2007   s/p lumpectomy and chemoradiation   Cardiomyopathy secondary    GERD (gastroesophageal reflux disease)    Glaucoma    History of first degree AV block    high grade; PPM placed   History of kidney stones    Hypersomnolence 09/21/2013   Hypertension    Hypothyroidism    no meds currently   Leg edema, left 09/21/2013   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   Migraines    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   Ovarian cyst 11/06/2020   Pacemaker [employer] 2004   Personal history of chemotherapy 2009   left breast ca   Personal history of radiation therapy 2009   left breast   Sinus tachycardia    Type 2 diabetes mellitus ([place]) 06/19/2015    Past Surgical History:  Procedure Laterality Date   ANKLE SURGERY Left    x 3   BREAST EXCISIONAL BIOPSY Left 2009    Stage IIa, ER/PR negative, 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   Subjective Assessment - 06/07/21 1123     Subjective Pt reports continued centralization with extension based exercise. She reports her current LBP pain as 6/10 on NPRS. She reports active participation in [place].    Pertinent History Patient is a 70 year old female presenting with L sided LBP. Reports LBP is chronic, with recent exacerbation in July 2022 after driving several hours. Reports while she was driving back from [place] she had radiating pain down LLE down her ankle and has been struggling with this sided pain with redicular symptoms since. Patinet reports pain is constant in L side of back and buttock, with pain down post LLE 50% time brought on by walking >55mns, sitting with her knees bent. Her back  pain is made worse by standing >32mns, lifting, stairs (has stairs to enter home with unilateral handrail, has second story she does not need to utilize) and at end of the day. Current pain 6/10; best 0/10 after tylenol; worst 10/10. 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 Reports there are times singing for church she has to offset the LLE and stand perdominatly RLE. History of L ankle pain with arthritis contributing to walking pain. Pt denies N/V, B&B changes, unexplained weight fluctuation, saddle paresthesia, fever, night  sweats, or unrelenting night pain at this time. No falls in past 61month not afraid to fall.    Limitations Lifting;Walking;Sitting;Standing;House hold activities    How long can you sit comfortably? >1hour in comfy chair; >1059m in hard chair    How long can you stand comfortably? >55m2m  How long can you walk comfortably? >20mi29m Diagnostic tests Xray             Therex   Nu Step L2 x 5 min LE only seat 7    BW RDL 2 x 8 reps with cues for set up with good carry over   Standing Lumbar extension over nu step seat x 15 reps   TRX Squat to chair 2 x 8-10 reps with cues to use less UE to aid in standing   Modified Bird dog with single leg/arm lift 2 x 3  reps on each extremity with increased pelvic rotation on L side  Glute Stretch 2 x 30sec   Piriformis stretch 2 x 30 sec         PT Education - 06/07/21 1125     Education Details therex form/technique    Person(s) Educated Patient    Methods Explanation;Demonstration    Comprehension Verbalized understanding;Returned demonstration              PT Short Term Goals - 05/29/21 1526       PT SHORT TERM GOAL #1   Title Pt will be independent with HEP in order to improve strength and balance in order to decrease fall risk and improve function at home and work.    Baseline 05/29/21 HEP given    Time 4    Period Weeks    Status New      PT SHORT TERM GOAL #2   Title Pt will decrease 5TSTS by at least 3 seconds in order to demonstrate clinically significant improvement in LE strength    Baseline 05/29/21 36sec    Time 4    Period Weeks    Status New               PT Long Term Goals - 05/29/21 1527       PT LONG TERM GOAL #1   Title Pt will decrease 5TSTS to at least 12 seconds in order to demonstrate clinically significant improvement in LE strength    Baseline 05/29/21 36sec    Time 8    Period Weeks    Status New      PT LONG TERM GOAL #2   Title Pt will increase 10MWT by at least 0.13 m/s  in order to demonstrate clinically significant improvement in community ambulation.    Baseline 05/29/21 SS: 0.51m/s67mtest 0.46m/s97m  Time 8    Period Weeks    Status New      PT LONG TERM GOAL #3   Title Pt will decrease worst pain as reported on NPRS by at least 3 points in order to demonstrate clinically significant reduction in pain.    Baseline 05/29/21 10/10    Time 8    Period Weeks    Status New      PT LONG TERM GOAL #4   Title Patient will increase FOTO score to 63 to demonstrate predicted increase in functional mobility to complete ADLs    Baseline 05/29/21 47    Time 8    Period Weeks    Status New                   Plan - 06/07/21 1208     Clinical Impression Statement Pt tolerated session well with no reports of increased LBP or radicular symptoms throughout session. Pt continues to respond well to repeated extention biases exercise with reports of centralization. Pt also continues to demonstrate decrease strength and stability with L LE. PT continued LE/core stregnthening and mobility utilizing multimodal cues with good ability to correct. Continue PT POC.    Personal Factors and Comorbidities Age;Comorbidity 3+;Fitness;Past/Current Experience    Comorbidities HTN, GERD, arthritis, chronic LBP, DM2    Examination-Activity Limitations Lift;Carry;Sit;Stand;Squat;Stairs;Transfers    Examination-Participation Restrictions Cleaning;Community Activity;Laundry;Driving    Stability/Clinical Decision Making Evolving/Moderate complexity    Clinical Decision Making Moderate    Rehab Potential Good    PT Frequency 2x / week    PT Duration 2 weeks    PT Treatment/Interventions ADLs/Self Care Home Management;Electrical Stimulation;Moist Heat;Traction;Cryotherapy;Ultrasound;Gait training;Stair training;Therapeutic activities;Therapeutic exercise;Neuromuscular re-education;Balance training;Patient/family education;Functional mobility training;Manual techniques;Passive range  of motion;Dry needling;Joint Manipulations;Spinal Manipulations    PT Next Visit Plan LE/core strengthening    PT Home Exercise Plan STS, piriformis stretch, repeated lumbar ext    Consulted and Agree with Plan of Care Patient             Patient will benefit from skilled therapeutic intervention in order to improve the following deficits and impairments:  Abnormal gait, Decreased activity tolerance, Decreased endurance, Decreased range of motion, Decreased strength, Increased fascial restricitons, Pain, Improper body mechanics, Decreased balance, Decreased mobility, Difficulty walking, Impaired flexibility, Postural dysfunction, Impaired tone  Visit Diagnosis: Chronic left-sided low back pain with left-sided sciatica     Problem List Patient Active Problem List   Diagnosis Date Noted   Chronic left-sided low back pain with left-sided sciatica 05/15/2021   S/P right oophorectomy 01/18/2021   Elevated TSH 12/14/2020   History of kidney stones 11/07/2020   Seasonal allergic rhinitis 08/11/2020   Chronic pain of left ankle 04/27/2020   Morbid obesity ([place]) 04/27/2020   B12 deficiency 09/28/2019   Advanced care planning/counseling discussion 09/09/2017   Hyperlipidemia associated with type 2 diabetes mellitus ([place]) 07/29/2016   Chemotherapy-induced neuropathy ([place]) 04/01/2016   History of breast cancer 10/02/2015   Hypertension associated with diabetes ([place]) 06/19/2015   Diabetes mellitus associated with hormonal etiology ([place]) 06/19/2015   Hypomagnesemia 06/19/2015   AV BLOCK, 1ST DEGREE 07/02/2010   PPM-[employer] 01/13/2009     Durwin Reges DPT Sharion Settler, SPT  Durwin Reges, PT 06/08/2021, 9:29 AM  [facility] [address] [place], [address] Phone: [phone]   Fax:  [phone]  Name: [name] MRN: [id number] Date of Birth: 1951/03/19

## 2021-06-07 NOTE — Patient Instructions (Addendum)
CPT code 11200 for skin tag removal x 3 visits, ICD-10 code L91.8 inflamed skin tags.    Recommend taking Heliocare sun protection supplement daily in sunny weather for additional sun protection. For maximum protection on the sunniest days, you can take up to 2 capsules of regular Heliocare OR take 1 capsule of Heliocare Ultra. For prolonged exposure (such as a full day in the sun), you can repeat your dose of the supplement 4 hours after your first dose. Heliocare can be purchased at [facility] or at [email or web address].    If you have any questions or concerns for your doctor, please call our main line at [phone] and press option 4 to reach your doctor's medical assistant. If no one answers, please leave a voicemail as directed and we will return your call as soon as possible. Messages left after 4 pm will be answered the following business day.   You may also send [place] a message via [place]. We typically respond to MyChart messages within 1-2 business days.  For prescription refills, please ask your pharmacy to contact our office. Our fax number is [phone].  If you have an urgent issue when the clinic is closed that cannot wait until the next business day, you can page your doctor at the number below.    Please note that while we do our best to be available for urgent issues outside of office hours, we are not available 24/7.   If you have an urgent issue and are unable to reach [place], you may choose to seek medical care at your doctor's office, retail clinic, urgent care center, or emergency room.  If you have a medical emergency, please immediately call 911 or go to the emergency department.  Pager Numbers  - Dr. Nehemiah Massed: [phone]  - Dr. Laurence Ferrari: [phone]  - Dr. Nicole Kindred: [phone]  In the event of inclement weather, please call our main line at [phone] for an update on the status of any delays or closures.  Dermatology Medication Tips: Please keep the  boxes that topical medications come in in order to help keep track of the instructions about where and how to use these. Pharmacies typically print the medication instructions only on the boxes and not directly on the medication tubes.   If your medication is too expensive, please contact our office at [phone] option 4 or send [place] a message through [place].   We are unable to tell what your co-pay for medications will be in advance as this is different depending on your insurance coverage. However, we may be able to find a substitute medication at lower cost or fill out paperwork to get insurance to cover a needed medication.   If a prior authorization is required to get your medication covered by your insurance company, please allow [place] 1-2 business days to complete this process.  Drug prices often vary depending on where the prescription is filled and some pharmacies may offer cheaper prices.  The website www.goodrx.com contains coupons for medications through different pharmacies. The prices here do not account for what the cost may be with help from insurance (it may be cheaper with your insurance), but the website can give you the price if you did not use any insurance.  - You can print the associated coupon and take it with your prescription to the pharmacy.  - You may also stop by our office during regular business hours and pick up a GoodRx coupon card.  - If you need your prescription  sent electronically to a different pharmacy, notify our office through [facility] or by phone at [phone] option 4.

## 2021-06-07 NOTE — Progress Notes (Signed)
   New Patient Visit  Subjective  [name] is a 70 y.o. female who presents for the following: irregular skin lesion (Has been there for a long time but she noticed it changing about 9 mths ago. She would like lesion checked today.).   The following portions of the chart were reviewed this encounter and updated as appropriate:   Tobacco  Allergies  Meds  Problems  Med Hx  Surg Hx  Fam Hx      Review of Systems:  No other skin or systemic complaints except as noted in HPI or Assessment and Plan.  Objective  Well appearing patient in no apparent distress; mood and affect are within normal limits.  A focused examination was performed including the face, trunk, neck, and extremities. Relevant physical exam findings are noted in the Assessment and Plan.  Back Subcutaneous nodule.   >30 R axilla, L axilla x 9 Fleshy, erythematous skin-colored pedunculated papules.     Assessment & Plan  Epidermal inclusion cyst Back  Benign-appearing. Exam most consistent with an epidermal inclusion cyst. Discussed that a cyst is a benign growth that can grow over time and sometimes get irritated or inflamed. Recommend observation if it is not bothersome. Discussed option of surgical excision to remove it if it is growing, symptomatic, or other changes noted. Please call for new or changing lesions so they can be evaluated.    Skin tags, multiple acquired >30 R axilla, L axilla x 9  Symptomatic. Discussed snip removal or cryotherapy. Patient to check with her insurance to see if covered and schedule. Plan three or more visits to remove all the irritated ones.    Seborrheic Keratoses - Stuck-on, waxy, tan-brown papules and/or plaques  - Benign-appearing - Discussed benign etiology and prognosis. - Observe - Call for any changes  Melanocytic Nevi - Tan-brown and/or pink-flesh-colored symmetric macules and papules - Benign appearing on exam today - Observation - Call clinic for new  or changing moles - Recommend daily use of broad spectrum spf 30+ sunscreen to sun-exposed areas.   Actinic Damage - chronic, secondary to cumulative UV radiation exposure/sun exposure over time - diffuse scaly erythematous macules with underlying dyspigmentation - Recommend daily broad spectrum sunscreen SPF 30+ to sun-exposed areas, reapply every 2 hours as needed.  - Recommend staying in the shade or wearing long sleeves, sun glasses (UVA+UVB protection) and wide brim hats (4-inch brim around the entire circumference of the hat). - Call for new or changing lesions. - Recommend taking Heliocare sun protection supplement daily in sunny weather for additional sun protection. For maximum protection on the sunniest days, you can take up to 2 capsules of regular Heliocare OR take 1 capsule of Heliocare Ultra. For prolonged exposure (such as a full day in the sun), you can repeat your dose of the supplement 4 hours after your first dose.  Return for 4-6 week for skin tag removal .  I, Rudell Cobb, CMA, am acting as scribe for Forest Gleason, MD .  Documentation: I have reviewed the above documentation for accuracy and completeness, and I agree with the above.  Forest Gleason, MD

## 2021-06-07 NOTE — Telephone Encounter (Signed)
Patient called [employer] today. Irritated and inflamed skin tags are covered by insurance bt a prior authorization would have to be done first.   This would come from our insurance dept and not from [place] or myself.  Thank you

## 2021-06-11 ENCOUNTER — Encounter: Payer: Medicare PPO | Admitting: Physical Therapy

## 2021-06-12 ENCOUNTER — Ambulatory Visit: Payer: Medicare PPO | Admitting: Physical Therapy

## 2021-06-12 ENCOUNTER — Encounter: Payer: Self-pay | Admitting: Physical Therapy

## 2021-06-12 DIAGNOSIS — M5442 Lumbago with sciatica, left side: Secondary | ICD-10-CM | POA: Diagnosis not present

## 2021-06-12 DIAGNOSIS — G8929 Other chronic pain: Secondary | ICD-10-CM

## 2021-06-12 NOTE — Therapy (Signed)
[facility] [address], [place], [address] Phone: [phone]   Fax:  [phone]  Physical Therapy Treatment  Patient Details  Name: [name] MRN: [id number] Date of Birth: 1950-10-02 No data recorded  Encounter Date: 06/12/2021   PT End of Session - 06/12/21 1439     Visit Number 4    Number of Visits 17    Date for PT Re-Evaluation 07/27/21    Authorization - Visit Number 4    Authorization - Number of Visits 10    PT Start Time 1430    PT Stop Time 1572    PT Time Calculation (min) 43 min    Activity Tolerance Patient tolerated treatment well    Behavior During Therapy [facility] for tasks assessed/performed             Past Medical History:  Diagnosis Date   Arthritis    Breast cancer, left ([place]) 09/2007   s/p lumpectomy and chemoradiation   Cardiomyopathy secondary    GERD (gastroesophageal reflux disease)    Glaucoma    History of first degree AV block    high grade; PPM placed   History of kidney stones    Hypersomnolence 09/21/2013   Hypertension    Hypothyroidism    no meds currently   Leg edema, left 09/21/2013   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   Migraines    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   Ovarian cyst 11/06/2020   Pacemaker [employer] 2004   Personal history of chemotherapy 2009   left breast ca   Personal history of radiation therapy 2009   left breast   Sinus tachycardia    Type 2 diabetes mellitus ([place]) 06/19/2015    Past Surgical History:  Procedure Laterality Date   ANKLE SURGERY Left    x 3   BREAST EXCISIONAL BIOPSY Left 2009    Stage IIa, ER/PR negative, 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   Subjective Assessment - 06/12/21 1432     Subjective Pt reports 4/10 pain in low back. She reports having increased radiating pain in L LE on Sunday that she was able to manage by performing HEP.    Pertinent History Patient is a 70 year old female presenting with L sided LBP. Reports LBP is chronic, with recent exacerbation in July 2022 after driving several hours. Reports while she was driving back from [place] she had radiating pain down LLE down her ankle and has been struggling with this sided pain with redicular symptoms since. Patinet reports pain is constant in L side of back and buttock, with pain down post LLE 50% time brought on by walking >48mns, sitting with her knees  bent. Her back pain is made worse by standing >27mns, lifting, stairs (has stairs to enter home with unilateral handrail, has second story she does not need to utilize) and at end of the day. Current pain 6/10; best 0/10 after tylenol; worst 10/10. 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 Reports there are times singing for church she has to offset the LLE and stand perdominatly RLE. History of L ankle pain with arthritis contributing to walking pain. Pt denies N/V, B&B changes, unexplained weight fluctuation, saddle paresthesia, fever, night sweats, or  unrelenting night pain at this time. No falls in past 670month not afraid to fall.    Limitations Lifting;Walking;Sitting;Standing;House hold activities    How long can you sit comfortably? >1hour in comfy chair; >1019m in hard chair    How long can you stand comfortably? >31m57m  How long can you walk comfortably? >20mi40m           Therex   Nu Step L2 x 5 min seat 7 LE only  SPM > 60   Standing Lumbar extension over nu step seat x 15 reps with reports of centralization of radicular symptoms   TRX Squat to chair 2 x 8-10 reps with cues to use less UE to aid in standing   FWD step up onto 1 x 10 reps leading with each LE with eccentric control; CGA and single UE support to prevent LOB  Lateral step ups/downs 1 x 6 reps each LE with eccentric control: CGA and single UE support to prevent LOB   Modified Bird dog on wall 2 x 5 reps x 3 sec hold on each extremity with increased pelvic rotation on L side   Standing hamstring stretch 2 x 30 sec with cues for initial set up with good carry over                          PT Education - 06/12/21 1437     Education Details therex form/ technique    Person(s) Educated Patient    Methods Explanation;Demonstration    Comprehension Verbalized understanding;Returned demonstration              PT Short Term Goals - 05/29/21 1526       PT SHORT TERM GOAL #1   Title Pt will be independent with HEP in order to improve strength and balance in order to decrease fall risk and improve function at home and work.    Baseline 05/29/21 HEP given    Time 4    Period Weeks    Status New      PT SHORT TERM GOAL #2   Title Pt will decrease 5TSTS by at least 3 seconds in order to demonstrate clinically significant improvement in LE strength    Baseline 05/29/21 36sec    Time 4    Period Weeks    Status New               PT Long Term Goals - 05/29/21 1527       PT LONG TERM GOAL #1   Title Pt will decrease  5TSTS to at least 12 seconds in order to demonstrate clinically significant improvement in LE strength  Baseline 05/29/21 36sec    Time 8    Period Weeks    Status New      PT LONG TERM GOAL #2   Title Pt will increase 10MWT by at least 0.13 m/s in order to demonstrate clinically significant improvement in community ambulation.    Baseline 05/29/21 SS: 0.62ms Fastest 0.970m    Time 8    Period Weeks    Status New      PT LONG TERM GOAL #3   Title Pt will decrease worst pain as reported on NPRS by at least 3 points in order to demonstrate clinically significant reduction in pain.    Baseline 05/29/21 10/10    Time 8    Period Weeks    Status New      PT LONG TERM GOAL #4   Title Patient will increase FOTO score to 63 to demonstrate predicted increase in functional mobility to complete ADLs    Baseline 05/29/21 47    Time 8    Period Weeks    Status New                   Plan - 06/12/21 1516     Clinical Impression Statement Pt tolerated session well with reports of decreased LBP and radicular symptoms at end of session. Pt continues to respond well to repeated extention biases exercise with reports of centralization. Pt also continues to demonstrate decrease strength, activity tolerance, and stability with L LE. PT continued LE/core stregnthening and mobility utilizing multimodal cues with good ability to correct and good motivation. Patient will continue to benefit from skilled therapy for exercise performance to ensure proper technique for safe and effective progression.    Personal Factors and Comorbidities Age;Comorbidity 3+;Fitness;Past/Current Experience    Comorbidities HTN, GERD, arthritis, chronic LBP, DM2    Examination-Activity Limitations Lift;Carry;Sit;Stand;Squat;Stairs;Transfers    Examination-Participation Restrictions Cleaning;Community Activity;Laundry;Driving    Stability/Clinical Decision Making Evolving/Moderate complexity    Clinical Decision Making  Moderate    Rehab Potential Good    PT Frequency 2x / week    PT Duration 2 weeks    PT Treatment/Interventions ADLs/Self Care Home Management;Electrical Stimulation;Moist Heat;Traction;Cryotherapy;Ultrasound;Gait training;Stair training;Therapeutic activities;Therapeutic exercise;Neuromuscular re-education;Balance training;Patient/family education;Functional mobility training;Manual techniques;Passive range of motion;Dry needling;Joint Manipulations;Spinal Manipulations    PT Next Visit Plan LE/core strengthening    PT Home Exercise Plan STS, piriformis stretch, repeated lumbar ext    Consulted and Agree with Plan of Care Patient             Patient will benefit from skilled therapeutic intervention in order to improve the following deficits and impairments:  Abnormal gait, Decreased activity tolerance, Decreased endurance, Decreased range of motion, Decreased strength, Increased fascial restricitons, Pain, Improper body mechanics, Decreased balance, Decreased mobility, Difficulty walking, Impaired flexibility, Postural dysfunction, Impaired tone  Visit Diagnosis: Chronic left-sided low back pain with left-sided sciatica     Problem List Patient Active Problem List   Diagnosis Date Noted   Chronic left-sided low back pain with left-sided sciatica 05/15/2021   S/P right oophorectomy 01/18/2021   Elevated TSH 12/14/2020   History of kidney stones 11/07/2020   Seasonal allergic rhinitis 08/11/2020   Chronic pain of left ankle 04/27/2020   Morbid obesity (HCH[place]09/09/2019   B12 deficiency 09/28/2019   Advanced care planning/counseling discussion 09/09/2017   Hyperlipidemia associated with type 2 diabetes mellitus (HCK[place]12/11/2015   Chemotherapy-induced neuropathy (HC[facility]08/02/2016   History of breast cancer 10/02/2015   Hypertension associated with diabetes (  [place]) 06/19/2015   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 [place], [address] Phone: [phone]   Fax:  [phone]  Name: DORISSA STINNETTE MRN: [id number] Date of Birth: 23-Feb-1951

## 2021-06-12 NOTE — Telephone Encounter (Signed)
I believe Sherian Rein still might be in training for another week and I was just advised Marcheta Grammes will be on PAL soon.  Dr. Laurence Ferrari if you can give me the CPT and ICD 10 code I can take care of this next week.

## 2021-06-13 ENCOUNTER — Ambulatory Visit: Payer: Medicare PPO | Admitting: Nurse Practitioner

## 2021-06-14 ENCOUNTER — Encounter: Payer: Self-pay | Admitting: Dermatology

## 2021-06-14 ENCOUNTER — Encounter: Payer: Self-pay | Admitting: Physical Therapy

## 2021-06-14 ENCOUNTER — Ambulatory Visit: Payer: Medicare PPO | Admitting: Physical Therapy

## 2021-06-14 DIAGNOSIS — M5442 Lumbago with sciatica, left side: Secondary | ICD-10-CM | POA: Diagnosis not present

## 2021-06-14 DIAGNOSIS — G8929 Other chronic pain: Secondary | ICD-10-CM

## 2021-06-14 NOTE — Therapy (Signed)
[facility] [address], [place], [address] Phone: [phone]   Fax:  [phone]  Physical Therapy Treatment  Patient Details  Name: [name] MRN: [id number] Date of Birth: [birth year] No data recorded  Encounter Date: 06/14/2021   PT End of Session - 06/14/21 1040     Visit Number 5    Number of Visits 17    Date for PT Re-Evaluation 07/27/21    Authorization - Visit Number 5    Authorization - Number of Visits 10    PT Start Time 9323    PT Stop Time 5573    PT Time Calculation (min) 39 min    Activity Tolerance Patient tolerated treatment well    Behavior During Therapy [facility] for tasks assessed/performed             Past Medical History:  Diagnosis Date   Arthritis    Breast cancer, left ([place]) 09/2007   s/p lumpectomy and chemoradiation   Cardiomyopathy secondary    GERD (gastroesophageal reflux disease)    Glaucoma    History of first degree AV block    high grade; PPM placed   History of kidney stones    Hypersomnolence 09/21/2013   Hypertension    Hypothyroidism    no meds currently   Leg edema, left 09/21/2013   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   Migraines    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   Ovarian cyst 11/06/2020   Pacemaker [employer] 2004   Personal history of chemotherapy 2009   left breast ca   Personal history of radiation therapy 2009   left breast   Sinus tachycardia    Type 2 diabetes mellitus ([place]) 06/19/2015    Past Surgical History:  Procedure Laterality Date   ANKLE SURGERY Left    x 3   BREAST EXCISIONAL BIOPSY Left 2009    Stage IIa, ER/PR negative, 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   Subjective Assessment - 06/14/21 1036     Subjective Pt reports 4/10 pain in low back. She reports improved ability to put on her shoes and is reports satisfaction with progress she is making with PT.    Pertinent History Patient is a 70 year old female presenting with L sided LBP. Reports LBP is chronic, with recent exacerbation in July 2022 after driving several hours. Reports while she was driving back from [place] she had radiating pain down LLE down her ankle and has been struggling with this sided pain with redicular symptoms since. Patinet reports pain is constant in L side of back and buttock, with pain down post LLE 50% time brought on by walking >68mns, sitting with her knees  bent. Her back pain is made worse by standing >41mns, lifting, stairs (has stairs to enter home with unilateral handrail, has second story she does not need to utilize) and at end of the day. Current pain 6/10; best 0/10 after tylenol; worst 10/10. 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 Reports there are times singing for church she has to offset the LLE and stand perdominatly RLE. History of L ankle pain with arthritis contributing to walking pain. Pt denies N/V, B&B changes, unexplained weight fluctuation, saddle paresthesia, fever, night sweats, or  unrelenting night pain at this time. No falls in past 617month not afraid to fall.    Limitations Lifting;Walking;Sitting;Standing;House hold activities    How long can you sit comfortably? >1hour in comfy chair; >1032m in hard chair    How long can you stand comfortably? >37m75m  How long can you walk comfortably? >20mi4m Diagnostic tests Xray            Therex   Nu Step L2 x 5 min seat 7 LE only  SPM > 60   Mini squat with swiss ball on wall 2 x 10 reps with evidenced increased wt shifting onto R LE and L knee valgus  Standing Lumbar extension over yellow swiss ball x 15 reps with reports of centralization of radicular symptoms   Modified Bird dog on wall 2 x 5 reps x 3 sec hold on each extremity with increased pelvic rotation on L side  Side stepping RTB x 3 trials down and back with increased hip compensation with increased fatigue   Piriformis stretch 2 x 30 sec   Standing Glute Stretch 2 x 30 sec                   PT Education - 06/14/21 1225     Education Details therex form/technique    Person(s) Educated Patient    Methods Explanation;Demonstration    Comprehension Verbalized understanding;Returned demonstration              PT Short Term Goals - 05/29/21 1526       PT SHORT TERM GOAL #1   Title Pt will be independent with HEP in order to improve strength and balance in order to decrease fall risk and improve function at home and work.    Baseline 05/29/21 HEP given    Time 4    Period Weeks    Status New      PT SHORT TERM GOAL #2   Title Pt will decrease 5TSTS by at least 3 seconds in order to demonstrate clinically significant improvement in LE strength    Baseline 05/29/21 36sec    Time 4    Period Weeks    Status New               PT Long Term Goals - 05/29/21 1527       PT LONG TERM GOAL #1   Title Pt will decrease 5TSTS to at least 12 seconds in order to demonstrate clinically significant improvement in LE strength     Baseline 05/29/21 36sec    Time 8    Period Weeks    Status New      PT LONG TERM GOAL #2  Title Pt will increase 10MWT by at least 0.13 m/s in order to demonstrate clinically significant improvement in community ambulation.    Baseline 05/29/21 SS: 0.59ms Fastest 0.939m    Time 8    Period Weeks    Status New      PT LONG TERM GOAL #3   Title Pt will decrease worst pain as reported on NPRS by at least 3 points in order to demonstrate clinically significant reduction in pain.    Baseline 05/29/21 10/10    Time 8    Period Weeks    Status New      PT LONG TERM GOAL #4   Title Patient will increase FOTO score to 63 to demonstrate predicted increase in functional mobility to complete ADLs    Baseline 05/29/21 47    Time 8    Period Weeks    Status New                   Plan - 06/14/21 1229     Clinical Impression Statement Pt tolerated session well with no report of increased LBP following end of session. PT continued LE/core strengthening progression with evidence of L knee valgus with mini squat activity. Pt however reports significant improvement in functional mobility and remains motivated with therapy. Pt demonstrates good ability to address form following multimodal cueing. Continue PT POC.    Personal Factors and Comorbidities Age;Comorbidity 3+;Fitness;Past/Current Experience    Comorbidities HTN, GERD, arthritis, chronic LBP, DM2    Examination-Activity Limitations Lift;Carry;Sit;Stand;Squat;Stairs;Transfers    Examination-Participation Restrictions Cleaning;Community Activity;Laundry;Driving    Stability/Clinical Decision Making Evolving/Moderate complexity    Clinical Decision Making Moderate    Rehab Potential Good    PT Frequency 2x / week    PT Duration 2 weeks    PT Treatment/Interventions ADLs/Self Care Home Management;Electrical Stimulation;Moist Heat;Traction;Cryotherapy;Ultrasound;Gait training;Stair training;Therapeutic activities;Therapeutic  exercise;Neuromuscular re-education;Balance training;Patient/family education;Functional mobility training;Manual techniques;Passive range of motion;Dry needling;Joint Manipulations;Spinal Manipulations    PT Next Visit Plan LE/core strengthening    PT Home Exercise Plan STS, piriformis stretch, repeated lumbar ext    Consulted and Agree with Plan of Care Patient             Patient will benefit from skilled therapeutic intervention in order to improve the following deficits and impairments:  Abnormal gait, Decreased activity tolerance, Decreased endurance, Decreased range of motion, Decreased strength, Increased fascial restricitons, Pain, Improper body mechanics, Decreased balance, Decreased mobility, Difficulty walking, Impaired flexibility, Postural dysfunction, Impaired tone  Visit Diagnosis: Chronic left-sided low back pain with left-sided sciatica     Problem List Patient Active Problem List   Diagnosis Date Noted   Chronic left-sided low back pain with left-sided sciatica 05/15/2021   S/P right oophorectomy 01/18/2021   Elevated TSH 12/14/2020   History of kidney stones 11/07/2020   Seasonal allergic rhinitis 08/11/2020   Chronic pain of left ankle 04/27/2020   Morbid obesity (HCH[place]09/09/2019   B12 deficiency 09/28/2019   Advanced care planning/counseling discussion 09/09/2017   Hyperlipidemia associated with type 2 diabetes mellitus (HCV[facility]12/11/2015   Chemotherapy-induced neuropathy (HCL[facility]08/02/2016   History of breast cancer 10/02/2015   Hypertension associated with diabetes (HCF[facility]10/24/2016   Diabetes mellitus associated with hormonal etiology (HCH[place]10/24/2016   Hypomagnesemia 06/19/2015   AV BLOCK, 1ST DEGREE 07/02/2010   PPM-[employer] 01/13/2009     ChDurwin RegesPT AuSharion SettlerSPT  ChDurwin RegesPT 06/15/2021, 9:34 AM  [facility]  [address] [place],  [address] Phone: [phone]   Fax:  [phone]  Name: [name] MRN: [id number] Date of Birth: 1951-08-22

## 2021-06-14 NOTE — Telephone Encounter (Signed)
Thanks Estill Bamberg,  The CPT is 11200 for up to 15 and 11201 for each additional 10 after that.  The ICD10 is L91. 8 and we have to note that they are inflamed and symptomatic. Thank you!

## 2021-06-18 NOTE — Telephone Encounter (Signed)
Called and spoke with [facility] today. Test claim was ran with representative and CPT codes and ICD 10 provided do not require PA.   REF # [id number]

## 2021-06-18 NOTE — Telephone Encounter (Signed)
I have called patient and advised her of information.

## 2021-06-19 ENCOUNTER — Ambulatory Visit: Payer: Medicare PPO | Admitting: Physical Therapy

## 2021-06-20 ENCOUNTER — Telehealth: Payer: Self-pay | Admitting: Nurse Practitioner

## 2021-06-20 NOTE — Telephone Encounter (Signed)
Copied from [place] [id number]. Topic: General - Other >> Jun 20, 2021 12:22 PM Leward Quan A wrote: Reason for CRM: Patient called in to ask the Nurse Health Advisor if she can please change her appointment on Monday 06/25/21 for a phone call because it not convenient to come to the office at that time Can be reached at  Ph# [phone]

## 2021-06-21 ENCOUNTER — Other Ambulatory Visit: Payer: Self-pay

## 2021-06-21 ENCOUNTER — Ambulatory Visit: Payer: Medicare PPO | Admitting: Physical Therapy

## 2021-06-21 ENCOUNTER — Ambulatory Visit: Payer: Medicare PPO | Admitting: Nurse Practitioner

## 2021-06-21 ENCOUNTER — Encounter: Payer: Self-pay | Admitting: Nurse Practitioner

## 2021-06-21 VITALS — BP 97/60 | HR 85 | Temp 98.6°F | Wt 228.0 lb

## 2021-06-21 DIAGNOSIS — M5442 Lumbago with sciatica, left side: Secondary | ICD-10-CM | POA: Diagnosis not present

## 2021-06-21 DIAGNOSIS — G8929 Other chronic pain: Secondary | ICD-10-CM

## 2021-06-21 DIAGNOSIS — R051 Acute cough: Secondary | ICD-10-CM | POA: Diagnosis not present

## 2021-06-21 MED ORDER — HYDROCOD POLST-CPM POLST ER 10-8 MG/5ML PO SUER: 5.0000 mL | Freq: Two times a day (BID) | ORAL | 0 refills | Status: DC | PRN

## 2021-06-21 MED ORDER — AZITHROMYCIN 250 MG PO TABS: ORAL_TABLET | ORAL | 0 refills | Status: AC

## 2021-06-21 MED ORDER — ALBUTEROL SULFATE HFA 108 (90 BASE) MCG/ACT IN AERS: 2.0000 | INHALATION_SPRAY | Freq: Four times a day (QID) | RESPIRATORY_TRACT | 0 refills | Status: DC | PRN

## 2021-06-21 NOTE — Patient Instructions (Signed)

## 2021-06-21 NOTE — Progress Notes (Signed)
BP 97/60   Pulse 85   Temp 98.6 F (37 C) (Oral)   Wt 228 lb (103.4 kg)   SpO2 98%   BMI 40.39 kg/m    Subjective:    Patient ID: [name], female    DOB: [birth year], 70 y.o.   MRN: [id number]  HPI: [name] is a 70 y.o. female  Chief Complaint  Patient presents with   Back Pain    Patient is here to follow up on Back Pain. Patient states her Sciatica is getting better and states she had to miss PT twice this week due to the cough she has had.    Cough    Patient states she usually get this "Fall Crud" every year. Patient states it starts in her head and then it drain downs and it is usually deep green or yellow mucus and then it typically moves down into her chest. Patient states when she coughs she has a urine leakage and she is filling up her pads every 2 hours. Patient states she took an at-home test on Monday and it was negative. Patient states she had been sick for about a week or a little over. Patient states she has tried OTC congestion medicatio   Sore Throat   BACK PAIN Follow-up for back pain, seen 05/15/21, continues to see chiropractor.  Started PT after last visit, which she reports is offering a lot of benefit.  Reports improvement. Duration: months Mechanism of injury: no trauma Location: lower back Onset: gradual Severity: 3/10 Quality: aching Frequency: intermittent Radiation: L leg below the knee Aggravating factors: none Alleviating factors: NSAIDs Status: stable Treatments attempted: ibuprofen  Relief with NSAIDs?: significant Nighttime pain:  yes Paresthesias / decreased sensation:  no Bowel / bladder incontinence:  no Fevers:  no Dysuria / urinary frequency:  no      UPPER RESPIRATORY TRACT INFECTION Started with symptoms last Wednesday and has progressively gotten worse.  Currently very hoarse, but improvement in this.  Has been in kindergarten class teaching.  Has been testing negative for Covid at home. Fever: no Cough: yes Shortness of  breath: no Wheezing: yes Chest pain: no Chest tightness: yes Chest congestion: yes Nasal congestion: no Runny nose: yes Post nasal drip: yes Sneezing: no Sore throat: yes Swollen glands: no Sinus pressure: yes Headache: yes Face pain: no Toothache: no Ear pain: none Ear pressure: none Eyes red/itching:no Eye drainage/crusting: no  Vomiting: no Rash: no Fatigue: yes Sick contacts: yes Strep contacts: no  Context: stable Recurrent sinusitis: no Relief with OTC cold/cough medications: yes  Treatments attempted: cold/sinus and mucinex    Relevant past medical, surgical, family and social history reviewed and updated as indicated. Interim medical history since our last visit reviewed. Allergies and medications reviewed and updated.  Review of Systems  Constitutional:  Positive for fatigue. Negative for activity change, appetite change, chills, diaphoresis and fever.  HENT:  Positive for congestion, postnasal drip, rhinorrhea, sinus pressure, sore throat and voice change. Negative for ear discharge, ear pain, sinus pain and sneezing.   Respiratory:  Positive for cough, chest tightness and wheezing. Negative for shortness of breath.   Cardiovascular:  Negative for chest pain, palpitations and leg swelling.  Gastrointestinal: Negative.   Musculoskeletal:  Negative for arthralgias.  Neurological: Negative.   Psychiatric/Behavioral: Negative.     Per HPI unless specifically indicated above     Objective:    BP 97/60   Pulse 85   Temp 98.6 F (37 C) (  Oral)   Wt 228 lb (103.4 kg)   SpO2 98%   BMI 40.39 kg/m   Wt Readings from Last 3 Encounters:  06/21/21 228 lb (103.4 kg)  05/15/21 225 lb 3.2 oz (102.2 kg)  03/05/21 227 lb 3.2 oz (103.1 kg)    Physical Exam Constitutional:      Appearance: Normal appearance. She is well-developed. She is obese.  HENT:     Head: Normocephalic.     Right Ear: Hearing, ear canal and external ear normal. A middle ear effusion is  present.     Left Ear: Hearing, ear canal and external ear normal. A middle ear effusion is present.     Nose: Rhinorrhea present. Rhinorrhea is clear.     Right Sinus: No maxillary sinus tenderness or frontal sinus tenderness.     Left Sinus: No maxillary sinus tenderness or frontal sinus tenderness.     Mouth/Throat:     Mouth: Mucous membranes are moist.     Pharynx: Posterior oropharyngeal erythema (mild with cobblestoning) present. No pharyngeal swelling or oropharyngeal exudate.     Tonsils: 1+ on the right. 1+ on the left.  Eyes:     General: Lids are normal.  Neck:     Thyroid: No thyroid mass, thyromegaly or thyroid tenderness.     Vascular: No carotid bruit.  Cardiovascular:     Rate and Rhythm: Normal rate and regular rhythm.     Heart sounds: Normal heart sounds.  Pulmonary:     Effort: Pulmonary effort is normal. No accessory muscle usage or respiratory distress.     Breath sounds: Wheezing present. No rhonchi.     Comments: Expiratory wheeze noted intermittently throughout. Abdominal:     General: Bowel sounds are normal.     Palpations: Abdomen is soft.  Musculoskeletal:     Cervical back: Normal.     Thoracic back: Normal.     Lumbar back: Normal.  Lymphadenopathy:     Head:     Right side of head: No submental, submandibular, tonsillar, preauricular or posterior auricular adenopathy.     Left side of head: No submental, submandibular, tonsillar, preauricular or posterior auricular adenopathy.     Cervical: No cervical adenopathy.  Skin:    General: Skin is warm.     Findings: No erythema or rash.  Neurological:     General: No focal deficit present.     Mental Status: She is alert and oriented to person, place, and time.  Psychiatric:        Attention and Perception: Attention normal.        Mood and Affect: Mood normal.        Speech: Speech normal.        Behavior: Behavior normal. Behavior is cooperative.        Thought Content: Thought content normal.    Results for orders placed or performed in visit on 05/15/21  Bayer DCA Hb A1c Waived  Result Value Ref Range   HB A1C (BAYER DCA - WAIVED) 6.0 (H) 4.8 - 5.6 %  Basic metabolic panel  Result Value Ref Range   Glucose 129 (H) 65 - 99 mg/dL   BUN 26 8 - 27 mg/dL   Creatinine, Ser 1.08 (H) 0.57 - 1.00 mg/dL   eGFR 55 (L) >59 mL/min/1.73   BUN/Creatinine Ratio 24 12 - 28   Sodium 146 (H) 134 - 144 mmol/L   Potassium 4.4 3.5 - 5.2 mmol/L   Chloride 107 (H) 96 - 106  mmol/L   CO2 22 20 - 29 mmol/L   Calcium 9.6 8.7 - 10.3 mg/dL  T4, free  Result Value Ref Range   Free T4 1.08 0.82 - 1.77 ng/dL  TSH  Result Value Ref Range   TSH 3.400 0.450 - 4.500 uIU/mL  Magnesium  Result Value Ref Range   Magnesium 1.9 1.6 - 2.3 mg/dL      Assessment & Plan:   Problem List Items Addressed This Visit       Nervous and Auditory   Chronic left-sided low back pain with left-sided sciatica - Primary    Improving at this time with physical therapy, denies any concerns.  Continue PT and chiropractor care.        Other   Cough    Ongoing for one week -- suspect RSV infection as has been around children.  Negative for Covid at home on testing.  At this time will start Azithromycin and avoid Prednisone (does not tolerate).  Send in script for Tussionex and Albuterol to assist.  Recommend: - Increased rest - Increasing Fluids - Acetaminophen needed for fever/pain.  - Salt water gargling, chloraseptic spray and throat lozenges - Mucinex.  Return to office if any worsening or ongoing symptoms.        Follow up plan: Return in about 8 weeks (around 08/15/2021) for T2DM, HTN/HLD, MAG CHECK.

## 2021-06-21 NOTE — Assessment & Plan Note (Signed)
Ongoing for one week -- suspect RSV infection as has been around children.  Negative for Covid at home on testing.  At this time will start Azithromycin and avoid Prednisone (does not tolerate).  Send in script for Tussionex and Albuterol to assist.  Recommend: - Increased rest - Increasing Fluids - Acetaminophen needed for fever/pain.  - Salt water gargling, chloraseptic spray and throat lozenges - Mucinex.  Return to office if any worsening or ongoing symptoms.

## 2021-06-21 NOTE — Assessment & Plan Note (Signed)
Improving at this time with physical therapy, denies any concerns.  Continue PT and chiropractor care.

## 2021-06-25 ENCOUNTER — Ambulatory Visit (INDEPENDENT_AMBULATORY_CARE_PROVIDER_SITE_OTHER): Payer: Medicare PPO

## 2021-06-25 VITALS — Ht 63.5 in | Wt 225.0 lb

## 2021-06-25 DIAGNOSIS — Z0000 Encounter for general adult medical examination without abnormal findings: Secondary | ICD-10-CM

## 2021-06-25 NOTE — Progress Notes (Signed)
I connected with [name] today by telephone and verified that I am speaking with the correct person using two identifiers. Location patient: home Location provider: work Persons participating in the virtual visit: Alivya Wegman, Glenna Durand LPN.   I discussed the limitations, risks, security and privacy concerns of performing an evaluation and management service by telephone and the availability of in person appointments. I also discussed with the patient that there may be a patient responsible charge related to this service. The patient expressed understanding and verbally consented to this telephonic visit.    Interactive audio and video telecommunications were attempted between this provider and patient, however failed, due to patient having technical difficulties OR patient did not have access to video capability.  We continued and completed visit with audio only.     Vital signs may be patient reported or missing.  Subjective:   [name] is a 70 y.o. female who presents for Medicare Annual (Subsequent) preventive examination.  Review of Systems     Cardiac Risk Factors include: advanced age (>16mn, >>43women);diabetes mellitus;dyslipidemia;hypertension;obesity (BMI >30kg/m2)     Objective:    Today's Vitals   06/25/21 1426  Weight: 225 lb (102.1 kg)  Height: 5' 3.5" (1.613 m)   Body mass index is 39.23 kg/m.  Advanced Directives 06/25/2021 05/29/2021 01/18/2021 01/10/2021 12/06/2020 09/22/2020 06/26/2020  Does Patient Have a Medical Advance Directive? _0  Yes Yes  Type of AParamedicof [place] will - [place] will - - HMazieLiving will [place]Living will  Does patient want to make changes to medical advance directive? - No - Patient declined No - Patient declined - Yes (ED - Information included in AVS) - No - Patient declined  Copy of HLuvernein Chart? No - copy requested - - - - - No - copy requested  Would patient like information on creating a medical advance directive? - No - Patient declined - - - - -    Current Medications (verified) Outpatient Encounter Medications as of 06/25/2021  Medication Sig   albuterol (VENTOLIN HFA) 108 (90 Base) MCG/ACT inhaler Inhale 2 puffs into the lungs every 6 (six) hours as needed for wheezing or shortness of breath.   aMILoride (MIDAMOR) 5 MG tablet Take 1 tablet (5 mg total) by mouth daily. (Patient taking differently: Take 5 mg by mouth every morning.)   aspirin-acetaminophen-caffeine (EXCEDRIN MIGRAINE) 250-250-65 MG tablet Take 2 tablets by mouth every 6 (six) hours as needed for headache or migraine.   atenolol (TENORMIN) 50 MG tablet Take 1 tablet (50 mg total) by mouth daily. (Patient taking differently: Take 50 mg by mouth every morning.)   atorvastatin (LIPITOR) 40 MG tablet Take 1 tablet (40 mg total) by mouth daily. (Patient taking differently: Take 40 mg by mouth at bedtime.)   azelastine (ASTELIN) 0.1 % nasal spray Place 1 spray into both nostrils 2 (two) times daily. Use in each nostril as directed   chlorpheniramine-HYDROcodone (TUSSIONEX PENNKINETIC ER) 10-8 MG/5ML SUER Take 5 mLs by mouth every 12 (twelve) hours as needed for cough.   Cholecalciferol (VITAMIN D3) 50 MCG (2000 UT) TABS Take 1 tablet by mouth daily.   CINNAMON PO Take 3 capsules by mouth in the morning.   CRANBERRY-VITAMIN C PO Take 1 tablet by mouth daily.   Cyanocobalamin (VITAMIN B-12 PO) Take 1 tablet by mouth every other day.   empagliflozin (JARDIANCE) 25 MG  TABS tablet Take 1 tablet (25 mg total) by mouth daily before breakfast.   esomeprazole (NEXIUM) 20 MG capsule Take 20 mg by mouth every morning.   fluticasone (FLONASE) 50 MCG/ACT nasal spray Place 2 sprays into both nostrils daily.   glucose blood (ACCU-CHEK AVIVA PLUS) test strip TEST BLOOD SUGAR ONCE DAILY   levocetirizine (XYZAL) 5 MG  tablet Take 5 mg by mouth every evening.   MAGNESIUM CHLORIDE-CALCIUM PO Take 3 tablets by mouth daily.   magnesium oxide (MAG-OX) 400 MG tablet Take 1,200 mg by mouth daily.   melatonin 3 MG TABS tablet Take 3 mg by mouth at bedtime.   montelukast (SINGULAIR) 10 MG tablet Take 1 tablet (10 mg total) by mouth at bedtime.   nystatin (MYCOSTATIN/NYSTOP) powder Apply 1 application topically 2 (two) times daily.   pregabalin (LYRICA) 50 MG capsule Take 1 capsule (50 mg total) by mouth 3 (three) times daily.   SUMAtriptan (IMITREX) 100 MG tablet Take 1 tablet (100 mg total) by mouth as needed.   azithromycin (ZITHROMAX) 250 MG tablet Take 2 tablets on day 1, then 1 tablet daily on days 2 through 5 (Patient not taking: Reported on 06/25/2021)   simethicone (GAS-X) 80 MG chewable tablet Chew 1 tablet (80 mg total) by mouth every 6 (six) hours as needed for flatulence. (Patient not taking: Reported on 06/25/2021)   No facility-administered encounter medications on file as of 06/25/2021.    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   Ovarian cyst 11/06/2020   Pacemaker [employer] 2004   Personal history of chemotherapy 2009   left breast ca   Personal history of radiation therapy 2009   left breast   Sinus tachycardia    Type 2 diabetes mellitus ([place]) 06/19/2015   Past Surgical History:  Procedure Laterality Date   ANKLE SURGERY Left    x 3    BREAST EXCISIONAL BIOPSY Left 2009    Stage IIa, ER/PR negative, 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 / REPLACE / REMOVE PACEMAKER  2011   revision @ [facility]  05/13/2003   Guidant Rodman Comp - DR   ROBOTIC ASSISTED SALPINGO OOPHERECTOMY Right 01/18/2021   Procedure: XI ROBOTIC ASSISTED RIGHT SALPINGO-OOPHORECTOMY;  Surgeon: Homero Fellers, MD;  Location: [facility] ORS;  Service: Gynecology;  Laterality: Right;   Family History  Problem Relation Age of Onset   Diabetes Mother    Heart disease Mother    Thyroid disease Mother    Hypertension Father  Cancer Father    Alcohol abuse Father    Diabetes Father    Stroke Other        Family hx of CVA or stroke   Diabetes Brother    Diabetes Daughter    Diabetes Paternal Uncle    Diabetes Paternal Grandmother    Breast cancer Maternal Aunt    Social History   Socioeconomic History   Marital status: Divorced    Spouse name: Not on file   Number of children: Not on file   Years of education: Not on file   Highest education level: Bachelor's degree (e.g., BA, AB, BS)  Occupational History   Occupation: Full time    Employer: [employer]  Tobacco Use   Smoking status: Former    Packs/day: 1.00    Years: 15.00    Pack years: 15.00    Types: Cigarettes    Quit date: 08/26/1981    Years since quitting: 39.8   Smokeless tobacco: Never  Vaping Use   Vaping Use: Never used  Substance and Sexual Activity   Alcohol use: Not Currently   Drug use: No   Sexual activity: Not Currently  Other Topics Concern   Not on file  Social History Narrative   Divorced   Gets regular exercise   Social Determinants of Health    Financial Resource Strain: Low Risk    Difficulty of Paying Living Expenses: Not hard at all  Food Insecurity: No Food Insecurity   Worried About Charity fundraiser in the Last Year: Never true   Arboriculturist in the Last Year: Never true  Transportation Needs: No Transportation Needs   Lack of Transportation (Medical): No   Lack of Transportation (Non-Medical): No  Physical Activity: Insufficiently Active   Days of Exercise per Week: 2 days   Minutes of Exercise per Session: 40 min  Stress: No Stress Concern Present   Feeling of Stress : Not at all  Social Connections: Not on file    Tobacco Counseling Counseling given: Not Answered   Clinical Intake:  Pre-visit preparation completed: Yes  Pain : No/denies pain     Nutritional Status: BMI > 30  Obese Nutritional Risks: None Diabetes: Yes  How often do you need to have someone help you when you read instructions, pamphlets, or other written materials from your doctor or pharmacy?: 1 - Never What is the last grade level you completed in school?: bs degree  Diabetic? Yes Nutrition Risk Assessment:  Has the patient had any N/V/D within the last 2 months?  No  Does the patient have any non-healing wounds?  No  Has the patient had any unintentional weight loss or weight gain?  No   Diabetes:  Is the patient diabetic?  Yes  If diabetic, was a CBG obtained today?  No  Did the patient bring in their glucometer from home?  No  How often do you monitor your CBG's? daily.   Financial Strains and Diabetes Management:  Are you having any financial strains with the device, your supplies or your medication? No .  Does the patient want to be seen by Chronic Care Management for management of their diabetes?  No  Would the patient like to be referred to a Nutritionist or for Diabetic Management?  No   Diabetic Exams:  Diabetic Eye Exam: Completed 01/15/2021 Diabetic Foot Exam: Completed 05/15/2021   Interpreter  Needed?: No  Information entered by :: NAllen LPN   Activities  of Daily Living In your present state of health, do you have any difficulty performing the following activities: 06/25/2021 01/18/2021  Hearing? N N  Vision? N N  Difficulty concentrating or making decisions? N N  Walking or climbing stairs? N Y  Comment - sometimes due to previous injury  Dressing or bathing? N N  Doing errands, shopping? N N  Preparing Food and eating ? N -  Using the Toilet? N -  In the past six months, have you accidently leaked urine? Y -  Comment with coughing -  Do you have problems with loss of bowel control? N -  Managing your Medications? N -  Managing your Finances? N -  Housekeeping or managing your Housekeeping? N -  Some recent data might be hidden    Patient Care Team: Venita Lick, NP as PCP - General (Nurse Practitioner) Lloyd Huger, MD as Consulting Physician (Oncology) Deboraha Sprang, MD as Consulting Physician (Cardiology)  Indicate any recent Medical Services you may have received from other than [facility] providers in the past year (date may be approximate).     Assessment:   This is a routine wellness examination for Anjelica.  Hearing/Vision screen Vision Screening - Comments:: Regular eye exams, [facility]  Dietary issues and exercise activities discussed: Current Exercise Habits: Home exercise routine, Time (Minutes): 45, Frequency (Times/Week): 2, Weekly Exercise (Minutes/Week): 90   Goals Addressed             This Visit's Progress    Patient Stated       06/25/2021, get over illness and stay healthy       Depression Screen PHQ 2/9 Scores 06/25/2021 11/07/2020 06/23/2020 09/27/2019 03/29/2019 03/16/2018 09/09/2017  PHQ - 2 Score 0 0 0 0 0 0 0  PHQ- 9 Score - 0 0 - - 2 -    Fall Risk Fall Risk  06/25/2021 09/12/2020 06/23/2020 09/27/2019 03/29/2019  Falls in the past year? 0 0 0 0 0  Number falls in past yr: - - - 0 -  Injury with Fall? - - - 0 -  Risk  for fall due to : Medication side effect - Impaired balance/gait;Medication side effect - -  Follow up Falls evaluation completed;Education provided;Falls prevention discussed - Falls evaluation completed;Education provided;Falls prevention discussed Falls evaluation completed -    FALL RISK PREVENTION PERTAINING TO THE HOME:  Any stairs in or around the home? Yes  If so, are there any without handrails? No  Home free of loose throw rugs in walkways, pet beds, electrical cords, etc? Yes  Adequate lighting in your home to reduce risk of falls? Yes   ASSISTIVE DEVICES UTILIZED TO PREVENT FALLS:  Life alert? No  Use of a cane, walker or w/c? No  Grab bars in the bathroom? Yes  Shower chair or bench in shower? Yes  Elevated toilet seat or a handicapped toilet? Yes   TIMED UP AND GO:  Was the test performed? No .       Cognitive Function:     6CIT Screen 06/25/2021 06/23/2020 03/16/2018  What Year? 0 points 0 points 0 points  What month? 0 points 0 points 0 points  What time? 0 points 0 points 0 points  Count back from 20 0 points 0 points 0 points  Months in reverse 0 points 0 points 0 points  Repeat phrase 0 points 0 points 0 points  Total Score 0 0 0    Immunizations Immunization History  Administered Date(s) Administered   Fluad Quad(high Dose 65+) 06/28/2019, 04/27/2020, 05/15/2021   Influenza, High Dose Seasonal PF 06/22/2018   Influenza,inj,Quad PF,6+ Mos 06/19/2015, 06/24/2016, 06/23/2017   PFIZER(Purple Top)SARS-COV-2 Vaccination 10/13/2019, 11/03/2019, 11/07/2020   Pfizer Covid-19 Vaccine Bivalent Booster 81yr & up 05/16/2021   Pneumococcal Conjugate-13 04/01/2016   Pneumococcal Polysaccharide-23 03/16/2018   Tdap 05/22/2011   Zoster Recombinat (Shingrix) 06/09/2020, 10/13/2020   Zoster, Live 06/28/2014    TDAP status: Due, Education has been provided regarding the importance of this vaccine. Advised may receive this vaccine at local pharmacy or Health Dept.  Aware to provide a copy of the vaccination record if obtained from local pharmacy or Health Dept. Verbalized acceptance and understanding.  Flu Vaccine status: Up to date  Pneumococcal vaccine status: Up to date  Covid-19 vaccine status: Completed vaccines  Qualifies for Shingles Vaccine? Yes   Zostavax completed Yes   Shingrix Completed?: Yes  Screening Tests Health Maintenance  Topic Date Due   TETANUS/TDAP  05/21/2021   URINE MICROALBUMIN  11/07/2021   HEMOGLOBIN A1C  11/12/2021   OPHTHALMOLOGY EXAM  01/15/2022   FOOT EXAM  05/15/2022   Fecal DNA (Cologuard)  10/17/2022   MAMMOGRAM  01/16/2023   Pneumonia Vaccine 70 Years old  Completed   INFLUENZA VACCINE  Completed   DEXA SCAN  Completed   COVID-19 Vaccine  Completed   Hepatitis C Screening  Completed   Zoster Vaccines- Shingrix  Completed   HPV VACCINES  Aged Out    Health Maintenance  Health Maintenance Due  Topic Date Due   TETANUS/TDAP  05/21/2021    Colorectal cancer screening: Type of screening: Cologuard. Completed 10/18/2019. Repeat every 3 years  Mammogram status: Completed 01/15/2021. Repeat every year  Bone Density status: Completed 08/17/2014.   Lung Cancer Screening: (Low Dose CT Chest recommended if Age 71-80years, 30 pack-year currently smoking OR have quit w/in 15years.) does not qualify.   Lung Cancer Screening Referral: no  Additional Screening:  Hepatitis C Screening: does qualify; Completed 07/29/2016  Vision Screening: Recommended annual ophthalmology exams for early detection of glaucoma and other disorders of the eye. Is the patient up to date with their annual eye exam?  Yes  Who is the provider or what is the name of the office in which the patient attends annual eye exams? [facility] If pt is not established with a provider, would they like to be referred to a provider to establish care? No .   Dental Screening: Recommended annual dental exams for proper oral  hygiene  Community Resource Referral / Chronic Care Management: CRR required this visit?  No   CCM required this visit?  No      Plan:     I have personally reviewed and noted the following in the patient's chart:   Medical and social history Use of alcohol, tobacco or illicit drugs  Current medications and supplements including opioid prescriptions.  Functional ability and status Nutritional status Physical activity Advanced directives List of other physicians Hospitalizations, surgeries, and ER visits in previous 12 months Vitals Screenings to include cognitive, depression, and falls Referrals and appointments  In addition, I have reviewed and discussed with patient certain preventive protocols, quality metrics, and best practice recommendations. A written personalized care plan for preventive services as well as general preventive health recommendations were provided to patient.     NKellie Simmering LPN   [id number]  Nurse Notes: none

## 2021-06-25 NOTE — Patient Instructions (Signed)
[name] , Thank you for taking time to come for your Medicare Wellness Visit. I appreciate your ongoing commitment to your health goals. Please review the following plan we discussed and let me know if I can assist you in the future.   Screening recommendations/referrals: Colonoscopy: cologuard 10/18/2019, due 10/17/2022 Mammogram: completed 01/15/2021 Bone Density: completed 08/17/2014 Recommended yearly ophthalmology/optometry visit for glaucoma screening and checkup Recommended yearly dental visit for hygiene and checkup  Vaccinations: Influenza vaccine: completed 05/15/2021 Pneumococcal vaccine: completed 03/16/2018 Tdap vaccine: due Shingles vaccine: completed   Covid-19:05/16/2021, 11/07/2020, 11/03/2019, 10/13/2019  Advanced directives: Please bring a copy of your POA (Power of Attorney) and/or Living Will to your next appointment.   Conditions/risks identified: none  Next appointment: Follow up in one year for your annual wellness visit    Preventive Care 65 Years and Older, Female Preventive care refers to lifestyle choices and visits with your health care provider that can promote health and wellness. What does preventive care include? A yearly physical exam. This is also called an annual well check. Dental exams once or twice a year. Routine eye exams. Ask your health care provider how often you should have your eyes checked. Personal lifestyle choices, including: Daily care of your teeth and gums. Regular physical activity. Eating a healthy diet. Avoiding tobacco and drug use. Limiting alcohol use. Practicing safe sex. Taking low-dose aspirin every day. Taking vitamin and mineral supplements as recommended by your health care provider. What happens during an annual well check? The services and screenings done by your health care provider during your annual well check will depend on your age, overall health, lifestyle risk factors, and family history of  disease. Counseling  Your health care provider may ask you questions about your: Alcohol use. Tobacco use. Drug use. Emotional well-being. Home and relationship well-being. Sexual activity. Eating habits. History of falls. Memory and ability to understand (cognition). Work and work Statistician. Reproductive health. Screening  You may have the following tests or measurements: Height, weight, and BMI. Blood pressure. Lipid and cholesterol levels. These may be checked every 5 years, or more frequently if you are over 75 years old. Skin check. Lung cancer screening. You may have this screening every year starting at age 10 if you have a 30-pack-year history of smoking and currently smoke or have quit within the past 15 years. Fecal occult blood test (FOBT) of the stool. You may have this test every year starting at age 14. Flexible sigmoidoscopy or colonoscopy. You may have a sigmoidoscopy every 5 years or a colonoscopy every 10 years starting at age 52. Hepatitis C blood test. Hepatitis B blood test. Sexually transmitted disease (STD) testing. Diabetes screening. This is done by checking your blood sugar (glucose) after you have not eaten for a while (fasting). You may have this done every 1-3 years. Bone density scan. This is done to screen for osteoporosis. You may have this done starting at age 34. Mammogram. This may be done every 1-2 years. Talk to your health care provider about how often you should have regular mammograms. Talk with your health care provider about your test results, treatment options, and if necessary, the need for more tests. Vaccines  Your health care provider may recommend certain vaccines, such as: Influenza vaccine. This is recommended every year. Tetanus, diphtheria, and acellular pertussis (Tdap, Td) vaccine. You may need a Td booster every 10 years. Zoster vaccine. You may need this after age 36. Pneumococcal 13-valent conjugate (PCV13) vaccine. One  dose  is recommended after age 96. Pneumococcal polysaccharide (PPSV23) vaccine. One dose is recommended after age 12. Talk to your health care provider about which screenings and vaccines you need and how often you need them. This information is not intended to replace advice given to you by your health care provider. Make sure you discuss any questions you have with your health care provider. Document Released: 09/08/2015 Document Revised: 05/01/2016 Document Reviewed: 06/13/2015 Elsevier Interactive Patient Education  2017 [employer] Prevention in the Home Falls can cause injuries. They can happen to people of all ages. There are many things you can do to make your home safe and to help prevent falls. What can I do on the outside of my home? Regularly fix the edges of walkways and driveways and fix any cracks. Remove anything that might make you trip as you walk through a door, such as a raised step or threshold. Trim any bushes or trees on the path to your home. Use bright outdoor lighting. Clear any walking paths of anything that might make someone trip, such as rocks or tools. Regularly check to see if handrails are loose or broken. Make sure that both sides of any steps have handrails. Any raised decks and porches should have guardrails on the edges. Have any leaves, snow, or ice cleared regularly. Use sand or salt on walking paths during winter. Clean up any spills in your garage right away. This includes oil or grease spills. What can I do in the bathroom? Use night lights. Install grab bars by the toilet and in the tub and shower. Do not use towel bars as grab bars. Use non-skid mats or decals in the tub or shower. If you need to sit down in the shower, use a plastic, non-slip stool. Keep the floor dry. Clean up any water that spills on the floor as soon as it happens. Remove soap buildup in the tub or shower regularly. Attach bath mats securely with double-sided  non-slip rug tape. Do not have throw rugs and other things on the floor that can make you trip. What can I do in the bedroom? Use night lights. Make sure that you have a light by your bed that is easy to reach. Do not use any sheets or blankets that are too big for your bed. They should not hang down onto the floor. Have a firm chair that has side arms. You can use this for support while you get dressed. Do not have throw rugs and other things on the floor that can make you trip. What can I do in the kitchen? Clean up any spills right away. Avoid walking on wet floors. Keep items that you use a lot in easy-to-reach places. If you need to reach something above you, use a strong step stool that has a grab bar. Keep electrical cords out of the way. Do not use floor polish or wax that makes floors slippery. If you must use wax, use non-skid floor wax. Do not have throw rugs and other things on the floor that can make you trip. What can I do with my stairs? Do not leave any items on the stairs. Make sure that there are handrails on both sides of the stairs and use them. Fix handrails that are broken or loose. Make sure that handrails are as long as the stairways. Check any carpeting to make sure that it is firmly attached to the stairs. Fix any carpet that is loose or worn. Avoid having  throw rugs at the top or bottom of the stairs. If you do have throw rugs, attach them to the floor with carpet tape. Make sure that you have a light switch at the top of the stairs and the bottom of the stairs. If you do not have them, ask someone to add them for you. What else can I do to help prevent falls? Wear shoes that: Do not have high heels. Have rubber bottoms. Are comfortable and fit you well. Are closed at the toe. Do not wear sandals. If you use a stepladder: Make sure that it is fully opened. Do not climb a closed stepladder. Make sure that both sides of the stepladder are locked into place. Ask  someone to hold it for you, if possible. Clearly mark and make sure that you can see: Any grab bars or handrails. First and last steps. Where the edge of each step is. Use tools that help you move around (mobility aids) if they are needed. These include: Canes. Walkers. Scooters. Crutches. Turn on the lights when you go into a dark area. Replace any light bulbs as soon as they burn out. Set up your furniture so you have a clear path. Avoid moving your furniture around. If any of your floors are uneven, fix them. If there are any pets around you, be aware of where they are. Review your medicines with your doctor. Some medicines can make you feel dizzy. This can increase your chance of falling. Ask your doctor what other things that you can do to help prevent falls. This information is not intended to replace advice given to you by your health care provider. Make sure you discuss any questions you have with your health care provider. Document Released: 06/08/2009 Document Revised: 01/18/2016 Document Reviewed: 09/16/2014 Elsevier Interactive Patient Education  2017 [employer].

## 2021-06-26 ENCOUNTER — Ambulatory Visit: Payer: Medicare PPO | Attending: Nurse Practitioner

## 2021-06-26 ENCOUNTER — Encounter: Payer: Self-pay | Admitting: Physical Therapy

## 2021-06-26 DIAGNOSIS — M5442 Lumbago with sciatica, left side: Secondary | ICD-10-CM | POA: Diagnosis not present

## 2021-06-26 DIAGNOSIS — G8929 Other chronic pain: Secondary | ICD-10-CM | POA: Diagnosis present

## 2021-06-26 NOTE — Therapy (Signed)
[facility] [address], [place], [address] Phone: [phone]   Fax:  [phone]  Physical Therapy Treatment  Patient Details  Name: [name] MRN: [id number] Date of Birth: 01-Feb-1951 No data recorded  Encounter Date: 06/26/2021   PT End of Session - 06/26/21 1357     Visit Number 6    Number of Visits 17    Date for PT Re-Evaluation 07/27/21    Authorization Type [employer]- authoization for 05/31/21-08/24/21 for 24 visits    Authorization Time Period 05/29/21-07/27/21    Authorization - Visit Number 5    Authorization - Number of Visits 24    Progress Note Due on Visit 10    PT Start Time 1350    PT Stop Time 1430    PT Time Calculation (min) 40 min    Activity Tolerance Patient tolerated treatment well    Behavior During Therapy [facility] for tasks assessed/performed             Past Medical History:  Diagnosis Date   Arthritis    Breast cancer, left ([place]) 09/2007   s/p lumpectomy and chemoradiation   Cardiomyopathy secondary    GERD (gastroesophageal reflux disease)    Glaucoma    History of first degree AV block    high grade; PPM placed   History of kidney stones    Hypersomnolence 09/21/2013   Hypertension    Hypothyroidism    no meds currently   Leg edema, left 09/21/2013   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   Migraines    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   Ovarian cyst 11/06/2020   Pacemaker [employer] 2004   Personal history of chemotherapy 2009   left breast ca   Personal history of radiation therapy 2009   left breast   Sinus tachycardia    Type 2 diabetes mellitus ([place]) 06/19/2015    Past Surgical History:  Procedure Laterality Date   ANKLE SURGERY Left    x 3   BREAST EXCISIONAL BIOPSY Left 2009    Stage IIa, ER/PR negative, 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   Subjective Assessment - 06/26/21 1354     Subjective Pt reports back to therapy following sickness with RSV. She reports continuing with HEP as best she can. She reports her back pain as 2/10 on NPRS. She reports feeling 60-75% improvement with her back pain since starting PT.    Pertinent History Patient is a 70 year old female presenting with L sided LBP. Reports LBP is chronic, with recent exacerbation in July 2022 after driving several hours. Reports while she was driving back from [place] she had radiating pain down LLE down her  ankle and has been struggling with this sided pain with redicular symptoms since. Patinet reports pain is constant in L side of back and buttock, with pain down post LLE 50% time brought on by walking >62mns, sitting with her knees bent. Her back pain is made worse by standing >253ms, lifting, stairs (has stairs to enter home with unilateral handrail, has second story she does not need to utilize) and at end of the day. Current pain 6/10; best 0/10 after tylenol; worst 10/10. 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 She enjoys cooking, reading, singing at her church, and paCompany secretaryShe has trouble sitting and standing for prolonged periods for cooking and painting. Reports there are  times singing for church she has to offset the LLE and stand perdominatly RLE. History of L ankle pain with arthritis contributing to walking pain. Pt denies N/V, B&B changes, unexplained weight fluctuation, saddle paresthesia, fever, night sweats, or unrelenting night pain at this time. No falls in past 90m74monthnot afraid to fall.    Limitations Lifting;Walking;Sitting;Standing;House hold activities    How long can you sit comfortably? >1hour in comfy chair; >49m9min hard chair    How long can you stand comfortably? >30mi4m How long can you walk comfortably? >20min92mDiagnostic tests Xray    Pain Onset More than a month ago             Therex    Nu Step L1 x 5 min seat 7 LE only  SPM > 80   Total Gym Squats L26 2 x 10 reps with evidenced of L knee valgus   Standing Hip ABD 3 x 10 reps with verbal/tactile cues to correct form and leaning   Dead Bug 2 x 10 reps 3 sec hold on each with cues for eccentric control    Glute Bridges 2 x 8 reps with cueing for abdominal contracting   *patient given frequent rest breaks for adequate recovery             PT Education - 06/26/21 1442     Education Details therex form/technique    Person(s) Educated Patient    Methods Explanation    Comprehension Verbalized understanding;Returned demonstration              PT Short Term Goals - 05/29/21 1526       PT SHORT TERM GOAL #1   Title Pt will be independent with HEP in order to improve strength and balance in order to decrease fall risk and improve function at home and work.    Baseline 05/29/21 HEP given    Time 4    Period Weeks    Status New      PT SHORT TERM GOAL #2   Title Pt will decrease 5TSTS by at least 3 seconds in order to demonstrate clinically significant improvement in LE strength    Baseline 05/29/21 36sec    Time 4    Period Weeks    Status New               PT Long Term Goals - 05/29/21 1527       PT LONG TERM GOAL #1   Title Pt will  decrease 5TSTS to at least 12 seconds in order to demonstrate clinically significant improvement in LE strength    Baseline 05/29/21 36sec    Time 8  Period Weeks    Status New      PT LONG TERM GOAL #2   Title Pt will increase 10MWT by at least 0.13 m/s in order to demonstrate clinically significant improvement in community ambulation.    Baseline 05/29/21 SS: 0.3ms Fastest 0.968m    Time 8    Period Weeks    Status New      PT LONG TERM GOAL #3   Title Pt will decrease worst pain as reported on NPRS by at least 3 points in order to demonstrate clinically significant reduction in pain.    Baseline 05/29/21 10/10    Time 8    Period Weeks    Status New      PT LONG TERM GOAL #4   Title Patient will increase FOTO score to 63 to demonstrate predicted increase in functional mobility to complete ADLs    Baseline 05/29/21 47    Time 8    Period Weeks    Status New                   Plan - 06/26/21 1444     Clinical Impression Statement Pt tolerated session fair with decreased activity tolerance d/t SOB but otherwise had no increased in LBP following therex. PT continued LE/core strengthening this session with success. Pt continues to be motivated with PT and is able to correct form with minimal cueing. Continue PT POC as able.    Personal Factors and Comorbidities Age;Comorbidity 3+;Fitness;Past/Current Experience    Comorbidities HTN, GERD, arthritis, chronic LBP, DM2    Examination-Activity Limitations Lift;Carry;Sit;Stand;Squat;Stairs;Transfers    Examination-Participation Restrictions Cleaning;Community Activity;Laundry;Driving    Stability/Clinical Decision Making Evolving/Moderate complexity    Clinical Decision Making Moderate    Rehab Potential Good    PT Frequency 2x / week    PT Duration 2 weeks    PT Treatment/Interventions ADLs/Self Care Home Management;Electrical Stimulation;Moist Heat;Traction;Cryotherapy;Ultrasound;Gait training;Stair training;Therapeutic  activities;Therapeutic exercise;Neuromuscular re-education;Balance training;Patient/family education;Functional mobility training;Manual techniques;Passive range of motion;Dry needling;Joint Manipulations;Spinal Manipulations    PT Next Visit Plan LE/core strengthening    PT Home Exercise Plan STS, piriformis stretch, repeated lumbar ext    Consulted and Agree with Plan of Care Patient             Patient will benefit from skilled therapeutic intervention in order to improve the following deficits and impairments:  Abnormal gait, Decreased activity tolerance, Decreased endurance, Decreased range of motion, Decreased strength, Increased fascial restricitons, Pain, Improper body mechanics, Decreased balance, Decreased mobility, Difficulty walking, Impaired flexibility, Postural dysfunction, Impaired tone  Visit Diagnosis: Chronic left-sided low back pain with left-sided sciatica     Problem List Patient Active Problem List   Diagnosis Date Noted   Chronic left-sided low back pain with left-sided sciatica 05/15/2021   S/P right oophorectomy 01/18/2021   Elevated TSH 12/14/2020   History of kidney stones 11/07/2020   Cough 09/12/2020   Seasonal allergic rhinitis 08/11/2020   Morbid obesity (HCR[place]09/09/2019   B12 deficiency 09/28/2019   Advanced care planning/counseling discussion 09/09/2017   Hyperlipidemia associated with type 2 diabetes mellitus (HCC[place]12/11/2015   Chemotherapy-induced neuropathy (HCA[place]08/02/2016   History of breast cancer 10/02/2015   Hypertension associated with diabetes (HCR[place]10/24/2016   Diabetes mellitus associated with hormonal etiology (HCGreen[place]10/24/2016   Hypomagnesemia 06/19/2015   AV BLOCK, 1ST DEGREE 07/02/2010   PPM-[employer] 01/13/2009   AuSharion SettlerSPT  AuSharion SettlerStudent-PT 06/26/2021, 2:49 PM  [facility] [address], [place], [address] Phone: [phone]    Fax:  [phone]  Name: [name] MRN: [id number] Date of Birth: 05-06-1951

## 2021-06-28 ENCOUNTER — Ambulatory Visit: Payer: Medicare PPO | Admitting: Physical Therapy

## 2021-06-28 ENCOUNTER — Encounter: Payer: Self-pay | Admitting: Physical Therapy

## 2021-06-28 DIAGNOSIS — M5442 Lumbago with sciatica, left side: Secondary | ICD-10-CM | POA: Diagnosis not present

## 2021-06-28 DIAGNOSIS — G8929 Other chronic pain: Secondary | ICD-10-CM

## 2021-06-28 NOTE — Therapy (Signed)
[facility] [address], [place], [address] Phone: [phone]   Fax:  [phone]  Physical Therapy Treatment  Patient Details  Name: [name] MRN: [id number] Date of Birth: 1950-11-05 No data recorded  Encounter Date: 06/28/2021   PT End of Session - 06/28/21 1350     Visit Number 7    Number of Visits 17    Date for PT Re-Evaluation 07/27/21    Authorization Type [employer]- authoization for 05/31/21-08/24/21 for 24 visits    Authorization Time Period 05/29/21-07/27/21    Authorization - Visit Number 7    Authorization - Number of Visits 24    Progress Note Due on Visit 10    PT Start Time 5277    PT Stop Time 1424    PT Time Calculation (min) 38 min    Activity Tolerance Patient tolerated treatment well    Behavior During Therapy [facility] for tasks assessed/performed             Past Medical History:  Diagnosis Date   Arthritis    Breast cancer, left ([place]) 09/2007   s/p lumpectomy and chemoradiation   Cardiomyopathy secondary    GERD (gastroesophageal reflux disease)    Glaucoma    History of first degree AV block    high grade; PPM placed   History of kidney stones    Hypersomnolence 09/21/2013   Hypertension    Hypothyroidism    no meds currently   Leg edema, left 09/21/2013   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   Migraines    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   Ovarian cyst 11/06/2020   Pacemaker [employer] 2004   Personal history of chemotherapy 2009   left breast ca   Personal history of radiation therapy 2009   left breast   Sinus tachycardia    Type 2 diabetes mellitus ([place]) 06/19/2015    Past Surgical History:  Procedure Laterality Date   ANKLE SURGERY Left    x 3   BREAST EXCISIONAL BIOPSY Left 2009    Stage IIa, ER/PR negative, 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   Subjective Assessment - 06/28/21 1352     Subjective Pt continues to report improvement of respiratory symptoms following RSV diagnosis.. She reports continuing with HEP. She reports her back pain as 0/10 and denies any radiating symptoms.    Pertinent History Patient is a 70 year old female presenting with L sided LBP. Reports LBP is chronic, with recent exacerbation in July 2022 after driving several hours. Reports while she was driving back from [place] she had radiating pain down LLE down her ankle and has been struggling with this sided pain with redicular  symptoms since. Patinet reports pain is constant in L side of back and buttock, with pain down post LLE 50% time brought on by walking >331mns, sitting with her knees bent. Her back pain is made worse by standing >231ms, lifting, stairs (has stairs to enter home with unilateral handrail, has second story she does not need to utilize) and at end of the day. Current pain 6/10; best 0/10 after tylenol; worst 10/10. 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 She enjoys cooking, reading, singing at her church, and paCompany secretaryShe has trouble sitting and standing for prolonged periods for cooking and painting. Reports there are times singing for church she has to offset  the LLE and stand perdominatly RLE. History of L ankle pain with arthritis contributing to walking pain. Pt denies N/V, B&B changes, unexplained weight fluctuation, saddle paresthesia, fever, night sweats, or unrelenting night pain at this time. No falls in past 31m29monthnot afraid to fall.    Limitations Lifting;Walking;Sitting;Standing;House hold activities    How long can you sit comfortably? >1hour in comfy chair; >74m61min hard chair    How long can you stand comfortably? >30mi21m How long can you walk comfortably? >20min87mDiagnostic tests Xray             Therex    Nu Step L1 x 5 min seat 7 LE only  SPM > 80   TRX Squats 2 x 10 reps with cueing for feet alignment   Mini Lateral Lunge onto bosu ball 2 x 8 reps with single UE support; with evidenced knee valgus on LLE   Dead Bug 1 x 10 reps 3 sec hold on each with cues for eccentric control; patient expressed discomfort in L Hip  Superman 2 x 10 reps with cueing for initial set up;    Prone Extension on hands 1 x 10 reps with 3-5 sec hold  *patient given frequent rest breaks for adequate recovery            PT Education - 06/28/21 1451     Education Details therex form/technique    Person(s) Educated Patient    Methods Explanation;Demonstration    Comprehension Verbalized understanding;Returned demonstration              PT Short Term Goals - 05/29/21 1526       PT SHORT TERM GOAL #1   Title Pt will be independent with HEP in order to improve strength and balance in order to decrease fall risk and improve function at home and work.    Baseline 05/29/21 HEP given    Time 4    Period Weeks    Status New      PT SHORT TERM GOAL #2   Title Pt will decrease 5TSTS by at least 3 seconds in order to demonstrate clinically significant improvement in LE strength    Baseline 05/29/21 36sec    Time 4    Period Weeks    Status New               PT Long Term Goals - 05/29/21 1527       PT LONG  TERM GOAL #1   Title Pt will decrease 5TSTS to at least 12 seconds in order to demonstrate clinically significant improvement in LE strength    Baseline 05/29/21 36sec    Time 8    Period  Weeks    Status New      PT LONG TERM GOAL #2   Title Pt will increase 10MWT by at least 0.13 m/s in order to demonstrate clinically significant improvement in community ambulation.    Baseline 05/29/21 SS: 0.90ms Fastest 0.977m    Time 8    Period Weeks    Status New      PT LONG TERM GOAL #3   Title Pt will decrease worst pain as reported on NPRS by at least 3 points in order to demonstrate clinically significant reduction in pain.    Baseline 05/29/21 10/10    Time 8    Period Weeks    Status New      PT LONG TERM GOAL #4   Title Patient will increase FOTO score to 63 to demonstrate predicted increase in functional mobility to complete ADLs    Baseline 05/29/21 47    Time 8    Period Weeks    Status New                   Plan - 06/28/21 1452     Clinical Impression Statement Pt tolerated session well with no increase in LBP following therex. PT continued LE/core strengthening progression with success. Pt continues to be motivated with PT and is able to correct form with minimal cueing. PT discussed likely discharge with patient following HEP independence and conistency of decreased low back symptoms. Continue PT POC as able.    Personal Factors and Comorbidities Age;Comorbidity 3+;Fitness;Past/Current Experience    Comorbidities HTN, GERD, arthritis, chronic LBP, DM2    Examination-Activity Limitations Lift;Carry;Sit;Stand;Squat;Stairs;Transfers    Examination-Participation Restrictions Cleaning;Community Activity;Laundry;Driving    Stability/Clinical Decision Making Evolving/Moderate complexity    Clinical Decision Making Moderate    Rehab Potential Good    PT Frequency 2x / week    PT Duration 2 weeks    PT Treatment/Interventions ADLs/Self Care Home Management;Electrical  Stimulation;Moist Heat;Traction;Cryotherapy;Ultrasound;Gait training;Stair training;Therapeutic activities;Therapeutic exercise;Neuromuscular re-education;Balance training;Patient/family education;Functional mobility training;Manual techniques;Passive range of motion;Dry needling;Joint Manipulations;Spinal Manipulations    PT Next Visit Plan LE/core strengthening    PT Home Exercise Plan STS, piriformis stretch, repeated lumbar ext    Consulted and Agree with Plan of Care Patient             Patient will benefit from skilled therapeutic intervention in order to improve the following deficits and impairments:  Abnormal gait, Decreased activity tolerance, Decreased endurance, Decreased range of motion, Decreased strength, Increased fascial restricitons, Pain, Improper body mechanics, Decreased balance, Decreased mobility, Difficulty walking, Impaired flexibility, Postural dysfunction, Impaired tone  Visit Diagnosis: Chronic left-sided low back pain with left-sided sciatica     Problem List Patient Active Problem List   Diagnosis Date Noted   Chronic left-sided low back pain with left-sided sciatica 05/15/2021   S/P right oophorectomy 01/18/2021   Elevated TSH 12/14/2020   History of kidney stones 11/07/2020   Cough 09/12/2020   Seasonal allergic rhinitis 08/11/2020   Morbid obesity (HCWinside09/09/2019   B12 deficiency 09/28/2019   Advanced care planning/counseling discussion 09/09/2017   Hyperlipidemia associated with type 2 diabetes mellitus (HCReading12/11/2015   Chemotherapy-induced neuropathy (HCBig Clifty08/02/2016   History of breast cancer 10/02/2015   Hypertension associated with diabetes (HCWeston10/24/2016   Diabetes mellitus associated with hormonal etiology (HCMammoth10/24/2016   Hypomagnesemia 06/19/2015   AV BLOCK, 1ST DEGREE 07/02/2010   PPM-[employer] 01/13/2009    ChDurwin RegesPT AuSharion SettlerSPT  ChDurwin RegesPT 06/29/2021,  9:58 AM  [facility] [address] [place], [address] Phone: [phone]   Fax:  [phone]  Name: [name] MRN: [id number] Date of Birth: 11/17/1950

## 2021-07-02 ENCOUNTER — Encounter: Payer: Self-pay | Admitting: Physical Therapy

## 2021-07-02 ENCOUNTER — Ambulatory Visit: Payer: Medicare PPO | Admitting: Physical Therapy

## 2021-07-02 DIAGNOSIS — G8929 Other chronic pain: Secondary | ICD-10-CM

## 2021-07-02 DIAGNOSIS — M5442 Lumbago with sciatica, left side: Secondary | ICD-10-CM

## 2021-07-02 NOTE — Therapy (Signed)
[facility] [address], [place], [address] Phone: [phone]   Fax:  [phone]  Physical Therapy Treatment  Patient Details  Name: [name] MRN: [id number] Date of Birth: [birth year] No data recorded  Encounter Date: 07/02/2021   PT End of Session - 07/02/21 1450     Visit Number 8    Number of Visits 17    Date for PT Re-Evaluation 07/27/21    Authorization Type [employer]- authoization for 05/31/21-08/24/21 for 24 visits    Authorization Time Period 05/29/21-07/27/21    Authorization - Visit Number 8    Authorization - Number of Visits 24    Progress Note Due on Visit 10    PT Start Time 9407    PT Stop Time 1508    PT Time Calculation (min) 40 min             Past Medical History:  Diagnosis Date   Arthritis    Breast cancer, left ([place]) 09/2007   s/p lumpectomy and chemoradiation   Cardiomyopathy secondary    GERD (gastroesophageal reflux disease)    Glaucoma    History of first degree AV block    high grade; PPM placed   History of kidney stones    Hypersomnolence 09/21/2013   Hypertension    Hypothyroidism    no meds currently   Leg edema, left 09/21/2013   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   Migraines    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   Ovarian cyst 11/06/2020   Pacemaker [employer] 2004   Personal history of chemotherapy 2009   left breast ca   Personal history of radiation therapy 2009   left breast   Sinus tachycardia    Type 2 diabetes mellitus ([place]) 06/19/2015    Past Surgical History:  Procedure Laterality Date   ANKLE SURGERY Left    x 3   BREAST EXCISIONAL BIOPSY Left 2009    Stage IIa, ER/PR negative, 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   Subjective Assessment - 07/02/21 1432     Subjective Pt reports her back was hurting this morning but was able to take tylenol and it has reduced. She reports 1/10 LBP currently.    Pertinent History Patient is a 70 year old female presenting with L sided LBP. Reports LBP is chronic, with recent exacerbation in July 2022 after driving several hours. Reports while she was driving back from [place] she had radiating pain down LLE down her ankle and has been struggling with this sided pain with redicular symptoms since. Patinet reports pain is constant in L side of back and buttock, with pain down post LLE 50% time brought on by  walking >59mns, sitting with her knees bent. Her back pain is made worse by standing >295ms, lifting, stairs (has stairs to enter home with unilateral handrail, has second story she does not need to utilize) and at end of the day. Current pain 6/10; best 0/10 after tylenol; worst 10/10. 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 She enjoys cooking, reading, singing at her church, and paCompany secretaryShe has trouble sitting and standing for prolonged periods for cooking and painting. Reports there are times singing for church she has to offset the LLE and stand perdominatly RLE. History of L ankle pain with arthritis contributing to walking pain. Pt denies N/V, B&B changes, unexplained weight fluctuation, saddle  paresthesia, fever, night sweats, or unrelenting night pain at this time. No falls in past 19m12monthnot afraid to fall.    Limitations Lifting;Walking;Sitting;Standing;House hold activities    How long can you sit comfortably? >1hour in comfy chair; >48m19min hard chair    How long can you stand comfortably? >30mi38m How long can you walk comfortably? >20min23mDiagnostic tests Xray    Pain Onset More than a month ago            Therex  Recumbent Bike L2 x 5 min Seat 12   TRX Squat to chair 2 x 8-10 reps with cues to use less UE to aid in standing   Side stepping RTB 10 ft x 3 trials down and back with increased hip compensation with increased fatigue   Lateral Step Downs 2 x 10 reps with BUE support and emphasis on eccentric control; difficulty L LE > R LE  Standing Lumbar extension over Nu Step seat x 15 reps  6" inches leg raises 3 x 15 sec   Open Books L<>R 3 x 30 sec with cueing for                                 PT Education - 07/02/21 1603     Education Details therex form/technique and education on dicharge and planning for exercise post PT.    Person(s) Educated Patient    Methods Explanation;Demonstration    Comprehension Verbalized understanding;Returned demonstration              PT Short Term Goals - 05/29/21 1526       PT SHORT TERM GOAL #1   Title Pt will be independent with HEP in order to improve strength and balance in order to decrease fall risk and improve function at home and work.    Baseline 05/29/21 HEP given    Time 4    Period Weeks    Status New      PT SHORT TERM GOAL #2   Title Pt will decrease 5TSTS by at least 3 seconds in order to demonstrate clinically significant improvement in LE strength    Baseline 05/29/21 36sec    Time 4    Period Weeks    Status New               PT Long Term Goals - 05/29/21 1527       PT LONG TERM GOAL #1   Title Pt will decrease 5TSTS to at least 12 seconds in  order to demonstrate clinically significant improvement in LE strength    Baseline 05/29/21 36sec  Time 8    Period Weeks    Status New      PT LONG TERM GOAL #2   Title Pt will increase 10MWT by at least 0.13 m/s in order to demonstrate clinically significant improvement in community ambulation.    Baseline 05/29/21 SS: 0.63ms Fastest 0.950m    Time 8    Period Weeks    Status New      PT LONG TERM GOAL #3   Title Pt will decrease worst pain as reported on NPRS by at least 3 points in order to demonstrate clinically significant reduction in pain.    Baseline 05/29/21 10/10    Time 8    Period Weeks    Status New      PT LONG TERM GOAL #4   Title Patient will increase FOTO score to 63 to demonstrate predicted increase in functional mobility to complete ADLs    Baseline 05/29/21 47    Time 8    Period Weeks    Status New                   Plan - 07/02/21 1509     Clinical Impression Statement Pt tolerated session well with reports of decrease in LBP following therex. PT continued LE/core strengthening progression with success. Pt continues to be motivated with PT and is able to correct form with minimal cueing. PT discussed discharge with patient next session. Continue PT POC as able.    Personal Factors and Comorbidities Age;Comorbidity 3+;Fitness;Past/Current Experience    Comorbidities HTN, GERD, arthritis, chronic LBP, DM2    Examination-Activity Limitations Lift;Carry;Sit;Stand;Squat;Stairs;Transfers    Examination-Participation Restrictions Cleaning;Community Activity;Laundry;Driving    Stability/Clinical Decision Making Evolving/Moderate complexity    Clinical Decision Making Moderate    PT Frequency 2x / week    PT Duration 2 weeks    PT Treatment/Interventions ADLs/Self Care Home Management;Electrical Stimulation;Moist Heat;Traction;Cryotherapy;Ultrasound;Gait training;Stair training;Therapeutic activities;Therapeutic exercise;Neuromuscular  re-education;Balance training;Patient/family education;Functional mobility training;Manual techniques;Passive range of motion;Dry needling;Joint Manipulations;Spinal Manipulations    PT Next Visit Plan LE/core strengthening    PT Home Exercise Plan STS, piriformis stretch, repeated lumbar ext    Consulted and Agree with Plan of Care Patient             Patient will benefit from skilled therapeutic intervention in order to improve the following deficits and impairments:  Abnormal gait, Decreased activity tolerance, Decreased endurance, Decreased range of motion, Decreased strength, Increased fascial restricitons, Pain, Improper body mechanics, Decreased balance, Decreased mobility, Difficulty walking, Impaired flexibility, Postural dysfunction, Impaired tone  Visit Diagnosis: Chronic left-sided low back pain with left-sided sciatica     Problem List Patient Active Problem List   Diagnosis Date Noted   Chronic left-sided low back pain with left-sided sciatica 05/15/2021   S/P right oophorectomy 01/18/2021   Elevated TSH 12/14/2020   History of kidney stones 11/07/2020   Cough 09/12/2020   Seasonal allergic rhinitis 08/11/2020   Morbid obesity (HCLeola09/09/2019   B12 deficiency 09/28/2019   Advanced care planning/counseling discussion 09/09/2017   Hyperlipidemia associated with type 2 diabetes mellitus (HCDeep River Center12/11/2015   Chemotherapy-induced neuropathy (HCPort Deposit08/02/2016   History of breast cancer 10/02/2015   Hypertension associated with diabetes (HCFrierson10/24/2016   Diabetes mellitus associated with hormonal etiology (HCGulkana10/24/2016   Hypomagnesemia 06/19/2015   AV BLOCK, 1ST DEGREE 07/02/2010   PPM-[employer] 01/13/2009     ChDurwin RegesPT AuSharion SettlerSPT  ChDurwin RegesPT 07/03/2021, 8:24 AM  [facility] [address] [place], [address] Phone: [phone]   Fax:   [phone]  Name: [name] MRN: [id number] Date of Birth: 1951/01/17

## 2021-07-03 ENCOUNTER — Encounter: Payer: Medicare PPO | Admitting: Physical Therapy

## 2021-07-04 ENCOUNTER — Other Ambulatory Visit: Payer: Self-pay

## 2021-07-04 ENCOUNTER — Ambulatory Visit: Payer: Medicare PPO | Admitting: Dermatology

## 2021-07-04 DIAGNOSIS — L918 Other hypertrophic disorders of the skin: Secondary | ICD-10-CM | POA: Diagnosis not present

## 2021-07-04 DIAGNOSIS — D485 Neoplasm of uncertain behavior of skin: Secondary | ICD-10-CM

## 2021-07-04 DIAGNOSIS — D489 Neoplasm of uncertain behavior, unspecified: Secondary | ICD-10-CM

## 2021-07-04 MED ORDER — MUPIROCIN 2 % EX OINT: 1.0000 "application " | TOPICAL_OINTMENT | Freq: Two times a day (BID) | CUTANEOUS | 0 refills | Status: DC

## 2021-07-04 NOTE — Progress Notes (Addendum)
Follow-Up Visit   Subjective  [name] is a 70 y.o. female who presents for the following: Follow-up (Patient here today for follow up on skin tag removal under arms. Patient denies other concerns at this time. ). They are symptomatic and irritated.  The following portions of the chart were reviewed this encounter and updated as appropriate:  Tobacco  Allergies  Meds  Problems  Med Hx  Surg Hx  Fam Hx      Review of Systems: No other skin or systemic complaints except as noted in HPI or Assessment and Plan.   Objective  Well appearing patient in no apparent distress; mood and affect are within normal limits.  A focused examination was performed including axillae . Relevant physical exam findings are noted in the Assessment and Plan.  bilateral axillae x 29 (29) Erythematous fleshy, skin-colored pedunculated papules.    Right Axilla inferior  0.7 cm light brown soft pedunculated papule   Right Axilla superior 0.6 cm erythematous skin colored papule   Assessment & Plan  Skin tag (29) bilateral axillae x 29  Irritated skin tags right axilla Symptomatic, visibly inflamed  Procedure: Skin tag removal Informed consent:  Discussed risks (permanent scarring, infection, pain, bleeding, bruising, redness, and recurrence of the lesion) and benefits of the procedure, as well as the alternatives.  Patient is aware that skin tags are benign lesions, and their removal is often not considered medically necessary.  Informed consent was obtained. The area was prepared with isopropyl alcohol. Anesthesia:  lidocaine 1% with epinephrine was injected to achieve good local anesthesia Snip removal was performed.   Antibiotic ointment and a sterile dressing were applied.   The patient tolerated procedure well. The patient was instructed on post-op care.   Number of lesions removed:  29   mupirocin ointment (BACTROBAN) 2 % - bilateral axillae x 29 Apply 1 application topically 2 (two)  times daily. Until healed  Neoplasm of uncertain behavior (2) Right Axilla inferior  Epidermal / dermal shaving  Lesion diameter (cm):  0.7 Informed consent: discussed and consent obtained   Timeout: patient name, date of birth, surgical site, and procedure verified   Anesthesia: the lesion was anesthetized in a standard fashion   Anesthetic:  1% lidocaine w/ epinephrine 1-100,000 local infiltration Instrument used: flexible razor blade   Hemostasis achieved with: aluminum chloride   Outcome: patient tolerated procedure well   Post-procedure details: wound care instructions given   Additional details:  Mupirocin and a bandage applied  Specimen 1 - Surgical pathology Differential Diagnosis: R/o irritated nevus vs skin tag vs neurofibroma    Check Margins: No   Right Axilla superior  Epidermal / dermal shaving  Lesion diameter (cm):  0.6 Informed consent: discussed and consent obtained   Timeout: patient name, date of birth, surgical site, and procedure verified   Anesthesia: the lesion was anesthetized in a standard fashion   Anesthetic:  1% lidocaine w/ epinephrine 1-100,000 local infiltration Instrument used: flexible razor blade   Hemostasis achieved with: aluminum chloride   Outcome: patient tolerated procedure well   Post-procedure details: wound care instructions given   Additional details:  Mupirocin and a bandage applied  Specimen 2 - Surgical pathology Differential Diagnosis: R/o irritated nevus vs skin tag vs neurofibroma    Check Margins: No  R/o irritated nevus vs skin tag vs neurofibroma   Return for january skin tag removal . I, Ruthell Rummage, CMA, am acting as scribe for Forest Gleason, MD.  Documentation:  I have reviewed the above documentation for accuracy and completeness, and I agree with the above.  Forest Gleason, MD

## 2021-07-04 NOTE — Patient Instructions (Addendum)
Biopsy Wound Care Instructions  Leave the original bandage on for 24 hours if possible.  If the bandage becomes soaked or soiled before that time, it is OK to remove it and examine the wound.  A small amount of post-operative bleeding is normal.  If excessive bleeding occurs, remove the bandage, place gauze over the site and apply continuous pressure (no peeking) over the area for 30 minutes. If this does not work, please call our clinic as soon as possible or page your doctor if it is after hours.   Once a day, cleanse the wound with soap and water. It is fine to shower. If a thick crust develops you may use a Q-tip dipped into dilute hydrogen peroxide (mix 1:1 with water) to dissolve it.  Hydrogen peroxide can slow the healing process, so use it only as needed.    After washing, apply petroleum jelly (Vaseline) or an antibiotic ointment if your doctor prescribed one for you, followed by a bandage.    For best healing, the wound should be covered with a layer of ointment at all times. If you are not able to keep the area covered with a bandage to hold the ointment in place, this may mean re-applying the ointment several times a day.  Continue this wound care until the wound has healed and is no longer open.   Itching and mild discomfort is normal during the healing process. However, if you develop pain or severe itching, please call our office.   If you have any discomfort, you can take Tylenol (acetaminophen) or ibuprofen as directed on the bottle. (Please do not take these if you have an allergy to them or cannot take them for another reason).  Some redness, tenderness and white or yellow material in the wound is normal healing.  If the area becomes very sore and red, or develops a thick yellow-green material (pus), it may be infected; please notify us.    If you have stitches, return to clinic as directed to have the stitches removed. You will continue wound care for 2-3 days after the stitches  are removed.   Wound healing continues for up to one year following surgery. It is not unusual to experience pain in the scar from time to time during the interval.  If the pain becomes severe or the scar thickens, you should notify the office.    A slight amount of redness in a scar is expected for the first six months.  After six months, the redness will fade and the scar will soften and fade.  The color difference becomes less noticeable with time.  If there are any problems, return for a post-op surgery check at your earliest convenience.  To improve the appearance of the scar, you can use silicone scar gel, cream, or sheets (such as Mederma or Serica) every night for up to one year. These are available over the counter (without a prescription).  Please call our office at [phone] for any questions or concerns.       If you have any questions or concerns for your doctor, please call our main line at [phone] and press option 4 to reach your doctor's medical assistant. If no one answers, please leave a voicemail as directed and we will return your call as soon as possible. Messages left after 4 pm will be answered the following business day.   You may also send [place] a message via [place]. We typically respond to MyChart messages within 1-2 business  days.  For prescription refills, please ask your pharmacy to contact our office. Our fax number is [phone].  If you have an urgent issue when the clinic is closed that cannot wait until the next business day, you can page your doctor at the number below.    Please note that while we do our best to be available for urgent issues outside of office hours, we are not available 24/7.   If you have an urgent issue and are unable to reach [place], you may choose to seek medical care at your doctor's office, retail clinic, urgent care center, or emergency room.  If you have a medical emergency, please immediately call 911 or go to the  emergency department.  Pager Numbers  - Dr. Nehemiah Massed: [phone]  - Dr. Laurence Ferrari: [phone]  - Dr. Nicole Kindred: [phone]  In the event of inclement weather, please call our main line at [phone] for an update on the status of any delays or closures.  Dermatology Medication Tips: Please keep the boxes that topical medications come in in order to help keep track of the instructions about where and how to use these. Pharmacies typically print the medication instructions only on the boxes and not directly on the medication tubes.   If your medication is too expensive, please contact our office at [phone] option 4 or send [place] a message through [place].   We are unable to tell what your co-pay for medications will be in advance as this is different depending on your insurance coverage. However, we may be able to find a substitute medication at lower cost or fill out paperwork to get insurance to cover a needed medication.   If a prior authorization is required to get your medication covered by your insurance company, please allow [place] 1-2 business days to complete this process.  Drug prices often vary depending on where the prescription is filled and some pharmacies may offer cheaper prices.  The website www.goodrx.com contains coupons for medications through different pharmacies. The prices here do not account for what the cost may be with help from insurance (it may be cheaper with your insurance), but the website can give you the price if you did not use any insurance.  - You can print the associated coupon and take it with your prescription to the pharmacy.  - You may also stop by our office during regular business hours and pick up a GoodRx coupon card.  - If you need your prescription sent electronically to a different pharmacy, notify our office through [facility] or by phone at [phone] option 4.

## 2021-07-05 ENCOUNTER — Encounter: Payer: Self-pay | Admitting: Physical Therapy

## 2021-07-05 ENCOUNTER — Ambulatory Visit: Payer: Medicare PPO | Admitting: Physical Therapy

## 2021-07-05 DIAGNOSIS — G8929 Other chronic pain: Secondary | ICD-10-CM

## 2021-07-05 DIAGNOSIS — M5442 Lumbago with sciatica, left side: Secondary | ICD-10-CM | POA: Diagnosis not present

## 2021-07-05 NOTE — Therapy (Signed)
[facility] [address], [place], [address] Phone: [phone]   Fax:  [phone]  Physical Therapy Treatment Discharge Summary Reporting Period 05/29/21-07/05/21  Patient Details  Name: [name] MRN: [id number] Date of Birth: [birth year] No data recorded  Encounter Date: 07/05/2021   PT End of Session - 07/05/21 1037     Visit Number 9    Number of Visits 17    Date for PT Re-Evaluation 07/27/21    Authorization Type [employer]- authoization for 05/31/21-08/24/21 for 24 visits    Authorization Time Period 05/29/21-07/27/21    Authorization - Visit Number 9    Authorization - Number of Visits 24    Progress Note Due on Visit 10    PT Start Time 1032    PT Stop Time 1105    PT Time Calculation (min) 33 min    Activity Tolerance Patient tolerated treatment well    Behavior During Therapy [facility] for tasks assessed/performed             Past Medical History:  Diagnosis Date   Arthritis    Breast cancer, left ([place]) 09/2007   s/p lumpectomy and chemoradiation   Cardiomyopathy secondary    GERD (gastroesophageal reflux disease)    Glaucoma    History of first degree AV block    high grade; PPM placed   History of kidney stones    Hypersomnolence 09/21/2013   Hypertension    Hypothyroidism    no meds currently   Leg edema, left 09/21/2013   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   Migraines    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   Ovarian cyst 11/06/2020   Pacemaker [employer] 2004   Personal history of chemotherapy 2009   left breast ca   Personal history of radiation therapy 2009   left breast   Sinus tachycardia    Type 2 diabetes mellitus ([place]) 06/19/2015    Past Surgical History:  Procedure Laterality Date   ANKLE SURGERY Left    x 3   BREAST EXCISIONAL BIOPSY Left 2009    Stage IIa, ER/PR negative, 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   Subjective Assessment - 07/05/21 1034     Subjective Pt reports feeling well is confident with performing exercise post PT to manage her LBP.    Pertinent History Patient is a 70 year old female presenting with L sided LBP. Reports LBP is chronic, with recent exacerbation in July 2022 after driving several hours. Reports while she was driving back from [place] she had radiating pain down LLE down her ankle and has been struggling with this sided pain with redicular symptoms since. Patinet reports pain is constant in  L side of back and buttock, with pain down post LLE 50% time brought on by walking >22mns, sitting with her knees bent. Her back pain is made worse by standing >269ms, lifting, stairs (has stairs to enter home with unilateral handrail, has second story she does not need to utilize) and at end of the day. Current pain 6/10; best 0/10 after tylenol; worst 10/10. 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 She enjoys cooking, reading, singing at her church, and paCompany secretaryShe has trouble sitting and standing for prolonged periods for cooking and painting. Reports there are times singing for church she has to offset the LLE and stand perdominatly RLE. History of  L ankle pain with arthritis contributing to walking pain. Pt denies N/V, B&B changes, unexplained weight fluctuation, saddle paresthesia, fever, night sweats, or unrelenting night pain at this time. No falls in past 40m31monthnot afraid to fall.    Limitations Lifting;Walking;Sitting;Standing;House hold activities    How long can you sit comfortably? >1hour in comfy chair; >34m50min hard chair    How long can you stand comfortably? >30mi71m How long can you walk comfortably? >20min47mDiagnostic tests Xray            Therex   Nu Step L3 x 3 min warm up  5XSTS: 11.3 sec 10MWT: 1.4 m/s  Access Code: [email or web address] Recumbent Bike - 1 x daily - 5 x weekly - 1 sets - 1 reps - 5-15 min hold Mini Squat - 1 x daily - 2-3 x weekly - 3 sets - 10 reps Single Leg Knee Extension with Weight Machine - 1 x daily - 2-3 x weekly - 3 sets - 10 reps Single Leg Hamstring Curl with Weight Machine - 1 x daily - 2-3 x weekly - 3 sets - 10 reps Standing Hip Abduction with Counter Support - 1 x daily - 2-3 x weekly - 3 sets - 10 reps Supine Bridge with Gluteal Set and Spinal Articulation - 1 x daily - 2-3 x weekly - 3 sets - 10 reps Standing Lumbar Extension - 1 x daily - 7 x weekly - 1 sets - 12-20 reps  Piriformis Stretch 3 x 30 sec x7/week      * Pt educated on progress to date, given HEP, and verbally expressed confidence in continue exercise independently                       PT Education - 07/06/21 1149     Education Details Pt educated on progress to date, HEP, and importance of exercise post PT.    Person(s) Educated Patient    Methods Explanation;Demonstration    Comprehension Verbalized understanding;Returned demonstration              PT Short Term Goals - 07/05/21 1038       PT SHORT TERM GOAL #1   Title Pt will be independent with HEP in order to improve strength and balance in order to decrease fall risk and improve function at home and work.     Baseline 05/29/21 HEP given 07/05/21:independent    Time 4    Period Weeks    Status Achieved      PT SHORT TERM GOAL #2   Title Pt will decrease 5TSTS by at least 3 seconds in order to demonstrate clinically significant improvement in  LE strength    Baseline 05/29/21 36sec 07/05/21: 11.3 sec    Time 4    Status Achieved               PT Long Term Goals - 07/05/21 1042       PT LONG TERM GOAL #1   Title Pt will decrease 5TSTS to at least 12 seconds in order to demonstrate clinically significant improvement in LE strength    Baseline 05/29/21 36sec 07/05/21: 11.3 sec    Time 8    Period Weeks    Status Achieved      PT LONG TERM GOAL #2   Title Pt will increase 10MWT by at least 0.13 m/s in order to demonstrate clinically significant improvement in community ambulation.    Baseline 05/29/21 SS: 0.860ms Fastest 0.965m 07/05/21: 1.5m105mfastest,  0.60m/72mS    Time 8    Period Weeks    Status Achieved      PT LONG TERM GOAL #3   Title Pt will decrease worst pain as reported on NPRS by at least 3 points in order to demonstrate clinically significant reduction in pain.    Baseline 05/29/21 10/10 07/05/21: 5/10    Time 8    Period Weeks    Status Achieved      PT LONG TERM GOAL #4   Title Patient will increase FOTO score to 63 to demonstrate predicted increase in functional mobility to complete ADLs    Baseline 05/29/21 47 07/05/21: 72    Time 8    Period Weeks    Status Achieved                   Plan - 07/05/21 1113     Clinical Impression Statement Patient is aware of discharge and prognosis. Patient verbalized understanding of discharge recommendations, achieved progress, and importance of continuation of exercise post PT.Overall, patient has demonstrated increased LE strength, functional mobility, decreased LBP pain, and is indepenendent with HEP. An HEP was provided and reviewed. The patient was able to demonstrate and verbalize understanding of exercises and  prescription. Patient has achieved all set goals. End PT POC this episode.    Personal Factors and Comorbidities Age;Comorbidity 3+;Fitness;Past/Current Experience    Comorbidities HTN, GERD, arthritis, chronic LBP, DM2    Examination-Activity Limitations Lift;Carry;Sit;Stand;Squat;Stairs;Transfers    Examination-Participation Restrictions Cleaning;Community Activity;Laundry;Driving    Stability/Clinical Decision Making Evolving/Moderate complexity    Clinical Decision Making Moderate    Rehab Potential Good    PT Frequency 2x / week    PT Duration 2 weeks    PT Treatment/Interventions ADLs/Self Care Home Management;Electrical Stimulation;Moist Heat;Traction;Cryotherapy;Ultrasound;Gait training;Stair training;Therapeutic activities;Therapeutic exercise;Neuromuscular re-education;Balance training;Patient/family education;Functional mobility training;Manual techniques;Passive range of motion;Dry needling;Joint Manipulations;Spinal Manipulations    PT Next Visit Plan LE/core strengthening    PT Home Exercise Plan STS, piriformis stretch, repeated lumbar ext    Consulted and Agree with Plan of Care Patient             Patient will benefit from skilled therapeutic intervention in order to improve the following deficits and impairments:  Abnormal gait, Decreased activity tolerance, Decreased endurance, Decreased range of motion, Decreased strength, Increased fascial restricitons, Pain, Improper body mechanics, Decreased balance, Decreased mobility, Difficulty walking, Impaired flexibility, Postural dysfunction, Impaired tone  Visit Diagnosis: Chronic left-sided low back pain with left-sided sciatica     Problem List Patient Active Problem List   Diagnosis Date Noted   Chronic left-sided low back pain with left-sided sciatica 05/15/2021  S/P right oophorectomy 01/18/2021   Elevated TSH 12/14/2020   History of kidney stones 11/07/2020   Cough 09/12/2020   Seasonal allergic rhinitis  08/11/2020   Morbid obesity ([place]) 04/27/2020   B12 deficiency 09/28/2019   Advanced care planning/counseling discussion 09/09/2017   Hyperlipidemia associated with type 2 diabetes mellitus ([place]) 07/29/2016   Chemotherapy-induced neuropathy ([place]) 04/01/2016   History of breast cancer 10/02/2015   Hypertension associated with diabetes ([place]) 06/19/2015   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 [place], [address] Phone: [phone]   Fax:  [phone]  Name: [name] MRN: [id number] Date of Birth: [birth year]

## 2021-07-10 ENCOUNTER — Encounter: Payer: Self-pay | Admitting: Dermatology

## 2021-07-10 ENCOUNTER — Encounter: Payer: Medicare PPO | Admitting: Physical Therapy

## 2021-07-11 ENCOUNTER — Ambulatory Visit: Payer: Self-pay | Admitting: *Deleted

## 2021-07-11 ENCOUNTER — Telehealth: Payer: Medicare PPO | Admitting: Nurse Practitioner

## 2021-07-11 ENCOUNTER — Encounter: Payer: Self-pay | Admitting: Nurse Practitioner

## 2021-07-11 ENCOUNTER — Telehealth: Payer: Self-pay

## 2021-07-11 DIAGNOSIS — R051 Acute cough: Secondary | ICD-10-CM | POA: Diagnosis not present

## 2021-07-11 MED ORDER — HYDROCOD POLST-CPM POLST ER 10-8 MG/5ML PO SUER: 5.0000 mL | Freq: Two times a day (BID) | ORAL | 0 refills | Status: DC | PRN

## 2021-07-11 MED ORDER — AZITHROMYCIN 250 MG PO TABS: ORAL_TABLET | ORAL | 0 refills | Status: AC

## 2021-07-11 NOTE — Telephone Encounter (Signed)
Fyi.  Mychart visit scheduled today.

## 2021-07-11 NOTE — Telephone Encounter (Signed)
The patient has been previously seen for cold and congestion   The patient shares that the medication they were previously taking has stopped working and they would like to receive a refill of their prescription to treat their remaining symptoms   The patient shares that their congestion is green/yellow in color   Please contact further  Called patient to review symptoms. Patient requesting additional z pack due to coughing up yellow / green colored sputum. Completed z pack approx. 2 weeks ago for similar symptoms but cough persists and now sputum changed from clear to yellow/ green. "Still tires out" easily. Reported some wheezing and stared back on inhaler. Denies chest pain difficulty breathing , no fever. Cough is better but now coughing up yellow green sputum. Please advise if appt needed. Patient reports she is supposed to go out of town next week and would like to "get rid" of cough. Please advise. Care advise given. Patient verbalized understanding of care advise and to call back or go to [facility] or ED if symptoms worsen.

## 2021-07-11 NOTE — Telephone Encounter (Signed)
Patient advised bx's showed benign skin tags, no treatment needed.Cherly Hensen

## 2021-07-11 NOTE — Assessment & Plan Note (Signed)
Ongoing with some improvement, but cough started worsening again last night -- suspect RSV infection as has been around children with this.  Negative for Covid at home on testing.  At this time will extend Azithromycin and avoid Prednisone (does not tolerate).  Send in refill for Tussionex and continue Albuterol as needed.  Recommend: - Increased rest - Increasing Fluids - Acetaminophen needed for fever/pain.  - Salt water gargling, chloraseptic spray and throat lozenges - Mucinex.  Return to office if any worsening or ongoing symptoms.

## 2021-07-11 NOTE — Telephone Encounter (Signed)
Noted  

## 2021-07-11 NOTE — Telephone Encounter (Signed)
Reason for Disposition  Cough has been present for > 3 weeks  Answer Assessment - Initial Assessment Questions 1. ONSET: "When did the cough begin?"      Since 06/21/21 dx RSV per patient report  2. SEVERITY: "How bad is the cough today?"      "Not bad" as it was before  3. SPUTUM: "Describe the color of your sputum" (none, dry cough; clear, white, yellow, green)     Yellow / green 4. HEMOPTYSIS: "Are you coughing up any blood?" If so ask: "How much?" (flecks, streaks, tablespoons, etc.)     no 5. DIFFICULTY BREATHING: "Are you having difficulty breathing?" If Yes, ask: "How bad is it?" (e.g., mild, moderate, severe)    - MILD: No SOB at rest, mild SOB with walking, speaks normally in sentences, can lie down, no retractions, pulse < 100.    - MODERATE: SOB at rest, SOB with minimal exertion and prefers to sit, cannot lie down flat, speaks in phrases, mild retractions, audible wheezing, pulse 100-120.    - SEVERE: Very SOB at rest, speaks in single words, struggling to breathe, sitting hunched forward, retractions, pulse > 120      "Tires out" easily 6. FEVER: "Do you have a fever?" If Yes, ask: "What is your temperature, how was it measured, and when did it start?"     no 7. CARDIAC HISTORY: "Do you have any history of heart disease?" (e.g., heart attack, congestive heart failure)      na 8. LUNG HISTORY: "Do you have any history of lung disease?"  (e.g., pulmonary embolus, asthma, emphysema)     na 9. PE RISK FACTORS: "Do you have a history of blood clots?" (or: recent major surgery, recent prolonged travel, bedridden)     na 10. OTHER SYMPTOMS: "Do you have any other symptoms?" (e.g., runny nose, wheezing, chest pain)       Some wheezing and started back with using inhaler 11. PREGNANCY: "Is there any chance you are pregnant?" "When was your last menstrual period?"       na 12. TRAVEL: "Have you traveled out of the country in the last month?" (e.g., travel history, exposures)        na  Protocols used: Cough - Acute Productive-A-AH

## 2021-07-11 NOTE — Patient Instructions (Signed)

## 2021-07-11 NOTE — Progress Notes (Signed)
There were no vitals taken for this visit.   Subjective:    Patient ID: [name], female    DOB: October 29, 1950, 70 y.o.   MRN: [id number]  HPI: [name] is a 70 y.o. female  Chief Complaint  Patient presents with   Cough    Patient states she is feeling a little better and she said that she is coughing up thick green mucus and states it was clear and thin. Patient states she noticed it changed overnight.    Medication Refill    Patient states she would like to have another round of a z-pak.    This visit was completed via video visit through MyChart due to the restrictions of the COVID-19 pandemic. All issues as above were discussed and addressed. Physical exam was done as above through visual confirmation on video through MyChart. If it was felt that the patient should be evaluated in the office, they were directed there. The patient verbally consented to this visit. Location of the patient: home Location of the provider: work Those involved with this call:  Provider: Marnee Guarneri, DNP CMA: Irena Reichmann, [place] Desk/Registration: [employer]  Time spent on call:  21 minutes with patient face to face via video conference. More than 50% of this time was spent in counseling and coordination of care. 15 minutes total spent in review of patient's record and preparation of their chart.  I verified patient identity using two factors (patient name and date of birth). Patient consents verbally to being seen via telemedicine visit today.    UPPER RESPIRATORY TRACT INFECTION Treated on 06/21/21 with Zpack for cough, then this morning and last night cough started to get a little worse again, spitting up green phlegm.  No Covid on home testing. Fever: no Cough: yes Shortness of breath: no Wheezing: yes Chest pain: no Chest tightness: no Chest congestion: no Nasal congestion: yes Runny nose: no Post nasal drip: no Sneezing: no Sore throat: no Swollen glands: no Sinus  pressure: no Headache: yes Face pain: no Toothache: no Ear pain: none Ear pressure: none Eyes red/itching:no Eye drainage/crusting: no  Vomiting: no Rash: no Fatigue: yes Sick contacts: no Strep contacts: no  Context: fluctuating Relief with OTC cold/cough medications: yes  Treatments attempted: cold/sinus, cough syrup, and antibiotics    Relevant past medical, surgical, family and social history reviewed and updated as indicated. Interim medical history since our last visit reviewed. Allergies and medications reviewed and updated.  Review of Systems  Constitutional:  Positive for fatigue. Negative for activity change, appetite change, chills, diaphoresis and fever.  HENT:  Positive for congestion. Negative for ear discharge, ear pain, postnasal drip, rhinorrhea, sinus pressure, sinus pain, sneezing, sore throat and voice change.   Respiratory:  Positive for cough and wheezing. Negative for chest tightness and shortness of breath.   Cardiovascular:  Negative for chest pain, palpitations and leg swelling.  Gastrointestinal: Negative.   Neurological: Negative.   Psychiatric/Behavioral: Negative.     Per HPI unless specifically indicated above     Objective:    There were no vitals taken for this visit.  Wt Readings from Last 3 Encounters:  06/25/21 225 lb (102.1 kg)  06/21/21 228 lb (103.4 kg)  05/15/21 225 lb 3.2 oz (102.2 kg)    Physical Exam Vitals and nursing note reviewed.  Constitutional:      General: She is awake. She is not in acute distress.    Appearance: She is well-developed. She is  not ill-appearing.  HENT:     Head: Normocephalic.     Right Ear: Hearing normal.     Left Ear: Hearing normal.  Eyes:     General: Lids are normal.        Right eye: No discharge.        Left eye: No discharge.     Conjunctiva/sclera: Conjunctivae normal.  Pulmonary:     Effort: Pulmonary effort is normal. No accessory muscle usage or respiratory distress.   Musculoskeletal:     Cervical back: Normal range of motion.  Neurological:     Mental Status: She is alert and oriented to person, place, and time.  Psychiatric:        Attention and Perception: Attention normal.        Mood and Affect: Mood normal.        Behavior: Behavior normal. Behavior is cooperative.        Thought Content: Thought content normal.        Judgment: Judgment normal.   Results for orders placed or performed in visit on 05/15/21  Bayer DCA Hb A1c Waived  Result Value Ref Range   HB A1C (BAYER DCA - WAIVED) 6.0 (H) 4.8 - 5.6 %  Basic metabolic panel  Result Value Ref Range   Glucose 129 (H) 65 - 99 mg/dL   BUN 26 8 - 27 mg/dL   Creatinine, Ser 1.08 (H) 0.57 - 1.00 mg/dL   eGFR 55 (L) >59 mL/min/1.73   BUN/Creatinine Ratio 24 12 - 28   Sodium 146 (H) 134 - 144 mmol/L   Potassium 4.4 3.5 - 5.2 mmol/L   Chloride 107 (H) 96 - 106 mmol/L   CO2 22 20 - 29 mmol/L   Calcium 9.6 8.7 - 10.3 mg/dL  T4, free  Result Value Ref Range   Free T4 1.08 0.82 - 1.77 ng/dL  TSH  Result Value Ref Range   TSH 3.400 0.450 - 4.500 uIU/mL  Magnesium  Result Value Ref Range   Magnesium 1.9 1.6 - 2.3 mg/dL      Assessment & Plan:   Problem List Items Addressed This Visit       Other   Cough    Ongoing with some improvement, but cough started worsening again last night -- suspect RSV infection as has been around children with this.  Negative for Covid at home on testing.  At this time will extend Azithromycin and avoid Prednisone (does not tolerate).  Send in refill for Tussionex and continue Albuterol as needed.  Recommend: - Increased rest - Increasing Fluids - Acetaminophen needed for fever/pain.  - Salt water gargling, chloraseptic spray and throat lozenges - Mucinex.  Return to office if any worsening or ongoing symptoms.       I discussed the assessment and treatment plan with the patient. The patient was provided an opportunity to ask questions and all were  answered. The patient agreed with the plan and demonstrated an understanding of the instructions.   The patient was advised to call back or seek an in-person evaluation if the symptoms worsen or if the condition fails to improve as anticipated.   I provided 21+ minutes of time during this encounter.   Follow up plan: Return if symptoms worsen or fail to improve.

## 2021-07-11 NOTE — Telephone Encounter (Signed)
-----   Message from Alfonso Patten, MD sent at 07/11/2021  2:29 PM EST ----- 1. Skin , right axilla inferior ACROCHORDON  2. Skin , right axilla superior ACROCHORDON  Skin tag, benign, no additional treatment needed.   MAs please call. Thank you!

## 2021-07-12 ENCOUNTER — Ambulatory Visit: Payer: Medicare PPO | Admitting: Physical Therapy

## 2021-07-17 ENCOUNTER — Encounter: Payer: Medicare PPO | Admitting: Physical Therapy

## 2021-07-24 ENCOUNTER — Encounter: Payer: Medicare PPO | Admitting: Physical Therapy

## 2021-07-26 ENCOUNTER — Other Ambulatory Visit: Payer: Medicare PPO

## 2021-08-10 ENCOUNTER — Other Ambulatory Visit: Payer: Self-pay | Admitting: Nurse Practitioner

## 2021-08-10 DIAGNOSIS — T451X5A Adverse effect of antineoplastic and immunosuppressive drugs, initial encounter: Secondary | ICD-10-CM

## 2021-08-10 DIAGNOSIS — G620 Drug-induced polyneuropathy: Secondary | ICD-10-CM

## 2021-08-11 NOTE — Telephone Encounter (Signed)
Requested medication (s) are due for refill today: yes  Requested medication (s) are on the active medication list: yes  Last refill:  05/15/21  Future visit scheduled: 08/15/21  Notes to clinic:  This medication can not be delegated, please assess.   Requested Prescriptions  Pending Prescriptions Disp Refills   pregabalin (LYRICA) 50 MG capsule [Pharmacy Med Name: PREGABALIN 50 MG CAPSULE] 270 capsule 0    Sig: Take 1 capsule (50 mg total) by mouth 3 (three) times daily.     Not Delegated - Neurology:  Anticonvulsants - Controlled Failed - 08/10/2021 12:49 PM      Failed - This refill cannot be delegated      Passed - Valid encounter within last 12 months    Recent Outpatient Visits           1 month ago Acute cough   [place], [place] T, NP   1 month ago Chronic left-sided low back pain with left-sided sciatica   [facility] [place], [name], NP   2 months ago Elevated TSH   [place], [place] T, NP   5 months ago Diabetes mellitus associated with hormonal etiology ([place])   [place] [name], [name], NP   6 months ago Diabetes mellitus associated with hormonal etiology ([place])   [place], [name], NP       Future Appointments             In 4 days [name], [name], NP [facility], [place]   In 2 weeks [place], [place], MD [place]   In 10 months  [facility], [name]

## 2021-08-12 DIAGNOSIS — E559 Vitamin D deficiency, unspecified: Secondary | ICD-10-CM | POA: Insufficient documentation

## 2021-08-15 ENCOUNTER — Other Ambulatory Visit: Payer: Self-pay

## 2021-08-15 ENCOUNTER — Ambulatory Visit: Payer: Medicare PPO | Admitting: Nurse Practitioner

## 2021-08-15 ENCOUNTER — Encounter: Payer: Self-pay | Admitting: Nurse Practitioner

## 2021-08-15 VITALS — BP 112/76 | HR 70 | Temp 98.3°F | Ht 63.5 in | Wt 226.8 lb

## 2021-08-15 DIAGNOSIS — E1169 Type 2 diabetes mellitus with other specified complication: Secondary | ICD-10-CM | POA: Diagnosis not present

## 2021-08-15 DIAGNOSIS — E785 Hyperlipidemia, unspecified: Secondary | ICD-10-CM

## 2021-08-15 DIAGNOSIS — R7989 Other specified abnormal findings of blood chemistry: Secondary | ICD-10-CM

## 2021-08-15 DIAGNOSIS — Z950 Presence of cardiac pacemaker: Secondary | ICD-10-CM

## 2021-08-15 DIAGNOSIS — G620 Drug-induced polyneuropathy: Secondary | ICD-10-CM | POA: Diagnosis not present

## 2021-08-15 DIAGNOSIS — E559 Vitamin D deficiency, unspecified: Secondary | ICD-10-CM

## 2021-08-15 DIAGNOSIS — I152 Hypertension secondary to endocrine disorders: Secondary | ICD-10-CM

## 2021-08-15 DIAGNOSIS — E1159 Type 2 diabetes mellitus with other circulatory complications: Secondary | ICD-10-CM

## 2021-08-15 DIAGNOSIS — I440 Atrioventricular block, first degree: Secondary | ICD-10-CM

## 2021-08-15 DIAGNOSIS — Z853 Personal history of malignant neoplasm of breast: Secondary | ICD-10-CM

## 2021-08-15 DIAGNOSIS — T451X5A Adverse effect of antineoplastic and immunosuppressive drugs, initial encounter: Secondary | ICD-10-CM

## 2021-08-15 DIAGNOSIS — E538 Deficiency of other specified B group vitamins: Secondary | ICD-10-CM

## 2021-08-15 LAB — BAYER DCA HB A1C WAIVED: HB A1C (BAYER DCA - WAIVED): 6.1 % — ABNORMAL HIGH (ref 4.8–5.6)

## 2021-08-15 NOTE — Assessment & Plan Note (Signed)
Chronic, ongoing with BP well below goal.  Continue current medication regimen and adjust as needed + continue collaboration with cardiology.  CMP today.  Recommend she continue to monitor BP at home and focus on DASH diet.  Return in 3 months.

## 2021-08-15 NOTE — Assessment & Plan Note (Signed)
Noted on recent labs with downward trend to normal range, no symptoms.  Recheck next visit.

## 2021-08-15 NOTE — Progress Notes (Signed)
BP 112/76    Pulse 70    Temp 98.3 F (36.8 C) (Oral)    Ht 5' 3.5" (1.613 m)    Wt 226 lb 12.8 oz (102.9 kg)    SpO2 99%    BMI 39.55 kg/m    Subjective:    Patient ID: [name], female    DOB: January 31, 1951, 70 y.o.   MRN: [id number]  HPI: [name] is a 70 y.o. female  Chief Complaint  Patient presents with   Diabetes   Hyperlipidemia   Hypertension   DIABETES Continues on Jardiance 25 MG daily, previously took Metformin, but switched to SGLT2 for heart health.  Last A1C in September was 6%.  Taking [employer] supplement. Hypoglycemic episodes:no Polydipsia/polyuria: no Visual disturbance: no Chest pain: no Paresthesias: no Glucose Monitoring: yes             Accucheck frequency: Daily             Fasting glucose: this morning 135, on average 110-120, since the switch to Jardiance has occasionally had some >130             Post prandial:             Evening:             Before meals: Taking Insulin?: no             Long acting insulin:             Short acting insulin: Blood Pressure Monitoring: daily Retinal Examination: Up to Date -- May 23rd, [facility] Foot Exam: Up to Date Pneumovax: Up to Date Influenza: Up to Date Aspirin: yes    HYPERTENSION / HYPERLIPIDEMIA Has pacemaker, [employer] -- 3 years on pacemaker. Followed by cardiology and last seen 02/13/21.    Current medications: Atenolol, Amiloride, Atorvastatin. Satisfied with current treatment? yes Duration of hypertension: chronic BP monitoring frequency: daily BP range: 110-120/70 range at home BP medication side effects: no Duration of hyperlipidemia: chronic Cholesterol medication side effects: no Cholesterol supplements: none Medication compliance: good compliance Aspirin: no Recent stressors: no Recurrent headaches: no Visual changes: no Palpitations: no Dyspnea: no Chest pain: no Lower extremity edema: no Dizzy/lightheaded: no    HISTORY OF BREAST CA: Ongoing  neuropathy and hypomagnesemia due to cancer history, was followed by oncology in past.  Received magnesium infusions, at this time is transitioned to PCP monitoring. Takes Lyrica for neuropathy pain + magnesium supplement daily with recent level improved at 1.9.  Does take Nexium for GERD -- takes daily.  ELEVATED TSH Noted on past two labs with recent within normal range in September.  There is a family history of thyroid issues.  History of lower B12 and Vitamin D levels past labs, did not tolerate B12 oral replacement. Fatigue: yes due to holidays Cold intolerance: no Heat intolerance: no Weight gain: no Weight loss: no Constipation: no Diarrhea/loose stools: no Palpitations: no Lower extremity edema: no Anxiety/depressed mood: no   Relevant past medical, surgical, family and social history reviewed and updated as indicated. Interim medical history since our last visit reviewed. Allergies and medications reviewed and updated.  Review of Systems  Constitutional:  Negative for activity change, appetite change, diaphoresis, fatigue and fever.  Respiratory:  Negative for cough, chest tightness and shortness of breath.   Cardiovascular:  Negative for chest pain, palpitations and leg swelling.  Gastrointestinal: Negative.   Neurological: Negative.   Psychiatric/Behavioral: Negative.  Per HPI unless specifically indicated above     Objective:    BP 112/76    Pulse 70    Temp 98.3 F (36.8 C) (Oral)    Ht 5' 3.5" (1.613 m)    Wt 226 lb 12.8 oz (102.9 kg)    SpO2 99%    BMI 39.55 kg/m   Wt Readings from Last 3 Encounters:  08/15/21 226 lb 12.8 oz (102.9 kg)  06/25/21 225 lb (102.1 kg)  06/21/21 228 lb (103.4 kg)    Physical Exam Vitals and nursing note reviewed.  Constitutional:      General: She is awake. She is not in acute distress.    Appearance: She is well-developed and well-groomed. She is morbidly obese. She is not ill-appearing.  HENT:     Head: Normocephalic.      Right Ear: Hearing, tympanic membrane, ear canal and external ear normal.     Left Ear: Hearing, tympanic membrane, ear canal and external ear normal.     Nose: Nose normal.     Right Sinus: No maxillary sinus tenderness or frontal sinus tenderness.     Left Sinus: No maxillary sinus tenderness or frontal sinus tenderness.     Mouth/Throat:     Mouth: Mucous membranes are moist.  Eyes:     General: Lids are normal.        Right eye: No discharge.        Left eye: No discharge.     Conjunctiva/sclera: Conjunctivae normal.     Pupils: Pupils are equal, round, and reactive to light.  Neck:     Thyroid: No thyromegaly.     Vascular: No carotid bruit.  Cardiovascular:     Rate and Rhythm: Normal rate and regular rhythm.     Heart sounds: Normal heart sounds. No murmur heard.   No gallop.  Pulmonary:     Effort: Pulmonary effort is normal. No accessory muscle usage or respiratory distress.     Breath sounds: Normal breath sounds.  Abdominal:     General: Bowel sounds are normal.     Palpations: Abdomen is soft.  Musculoskeletal:     Cervical back: Normal range of motion and neck supple.     Right lower leg: No edema.     Left lower leg: No edema.  Skin:    General: Skin is warm and dry.  Neurological:     Mental Status: She is alert and oriented to person, place, and time.  Psychiatric:        Attention and Perception: Attention normal.        Mood and Affect: Mood normal.        Speech: Speech normal.        Behavior: Behavior normal. Behavior is cooperative.        Thought Content: Thought content normal.    Results for orders placed or performed in visit on 05/15/21  Bayer DCA Hb A1c Waived  Result Value Ref Range   HB A1C (BAYER DCA - WAIVED) 6.0 (H) 4.8 - 5.6 %  Basic metabolic panel  Result Value Ref Range   Glucose 129 (H) 65 - 99 mg/dL   BUN 26 8 - 27 mg/dL   Creatinine, Ser 1.08 (H) 0.57 - 1.00 mg/dL   eGFR 55 (L) >59 mL/min/1.73   BUN/Creatinine Ratio 24 12 -  28   Sodium 146 (H) 134 - 144 mmol/L   Potassium 4.4 3.5 - 5.2 mmol/L   Chloride 107 (H) 96 -  106 mmol/L   CO2 22 20 - 29 mmol/L   Calcium 9.6 8.7 - 10.3 mg/dL  T4, free  Result Value Ref Range   Free T4 1.08 0.82 - 1.77 ng/dL  TSH  Result Value Ref Range   TSH 3.400 0.450 - 4.500 uIU/mL  Magnesium  Result Value Ref Range   Magnesium 1.9 1.6 - 2.3 mg/dL      Assessment & Plan:   Problem List Items Addressed This Visit       Cardiovascular and Mediastinum   AV BLOCK, 1ST DEGREE    Followed by cardiology, continue collaboration and review notes.      Hypertension associated with diabetes ([place])    Chronic, ongoing with BP well below goal.  Continue current medication regimen and adjust as needed + continue collaboration with cardiology.  CMP today.  Recommend she continue to monitor BP at home and focus on DASH diet.  Return in 3 months.      Relevant Orders   Bayer DCA Hb A1c Waived   Comprehensive metabolic panel     Endocrine   Diabetes mellitus associated with hormonal etiology ([place]) - Primary    Chronic, ongoing with A1C today 6.1%. Urine micro ALB 10 and A:C <30 in March 2022. Continue current medication regimen as A1C at goal and no hypoglycemia at home. If continued stable A1c trend may consider reduction of medication.  Continue to monitor BS at home daily.   Return in 3 months for visit.      Relevant Orders   Bayer DCA Hb A1c Waived   Hyperlipidemia associated with type 2 diabetes mellitus (HCC)    Chronic, ongoing.  Continue current medication regimen and adjust as needed.  Lipid panel today.      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  Continue Lyrica and adjust as needed, especially with aging monitor dose closely.  Refills up to date.  Return in 3 months.      Relevant Orders   Comprehensive  metabolic panel   Vitamin T51     Other   B12 deficiency    Chronic, ongoing, did not tolerate oral supplement daily.  Recheck B12 today and if low levels consider monthly injections.      Relevant Orders   Vitamin B12   Elevated TSH    Noted on recent labs with downward trend to normal range, no symptoms.  Recheck next visit.      History of breast cancer    Continue collaboration with oncology as needed.      Hypomagnesemia    Last level was 1.9, normal. Continue amiloride and follow up with nephrology. Magnesium rechecked today      Relevant Orders   Magnesium   Morbid obesity (HCC)    BMI 39.55 with T2DM and HTN.  Recommended eating smaller high protein, low fat meals more frequently and exercising 30 mins a day 5 times a week with a goal of 10-15lb weight loss in the next 3 months. Patient voiced their understanding and motivation to adhere to these recommendations.       PPM-[employer]    Continue collaboration with cardiology for checks.      Vitamin D deficiency    Noted past labs, continue supplement and recheck today.      Relevant Orders  VITAMIN D 25 Hydroxy (Vit-D Deficiency, Fractures)     Follow up plan: Return in about 3 months (around 11/13/2021) for Annual physical.

## 2021-08-15 NOTE — Assessment & Plan Note (Signed)
Followed by cardiology, continue collaboration and review notes.

## 2021-08-15 NOTE — Assessment & Plan Note (Signed)
Chronic, ongoing with A1C today 6.1%. Urine micro ALB 10 and A:C <30 in March 2022. Continue current medication regimen as A1C at goal and no hypoglycemia at home. If continued stable A1c trend may consider reduction of medication.  Continue to monitor BS at home daily.   Return in 3 months for visit.

## 2021-08-15 NOTE — Assessment & Plan Note (Signed)
Chronic, ongoing, did not tolerate oral supplement daily.  Recheck B12 today and if low levels consider monthly injections.

## 2021-08-15 NOTE — Assessment & Plan Note (Signed)
Noted past labs, continue supplement and recheck today.

## 2021-08-15 NOTE — Assessment & Plan Note (Signed)
Last level was 1.9, normal. Continue amiloride and follow up with nephrology. Magnesium rechecked today

## 2021-08-15 NOTE — Assessment & Plan Note (Signed)
BMI 39.55 with T2DM and HTN.  Recommended eating smaller high protein, low fat meals more frequently and exercising 30 mins a day 5 times a week with a goal of 10-15lb weight loss in the next 3 months. Patient voiced their understanding and motivation to adhere to these recommendations.

## 2021-08-15 NOTE — Assessment & Plan Note (Signed)
Continue collaboration with oncology as needed.

## 2021-08-15 NOTE — Assessment & Plan Note (Signed)
Chronic, ongoing.  Continue Lyrica and adjust as needed, especially with aging monitor dose closely.  Refills up to date.  Return in 3 months. 

## 2021-08-15 NOTE — Patient Instructions (Signed)

## 2021-08-15 NOTE — Assessment & Plan Note (Signed)
Chronic, ongoing.  Continue current medication regimen and adjust as needed. Lipid panel today. 

## 2021-08-15 NOTE — Assessment & Plan Note (Signed)
Continue collaboration with cardiology for checks. 

## 2021-08-16 LAB — COMPREHENSIVE METABOLIC PANEL
ALT: 15 IU/L (ref 0–32)
AST: 15 IU/L (ref 0–40)
Albumin/Globulin Ratio: 1.6 (ref 1.2–2.2)
Albumin: 4.1 g/dL (ref 3.7–4.7)
Alkaline Phosphatase: 114 IU/L (ref 44–121)
BUN/Creatinine Ratio: 23 (ref 12–28)
BUN: 30 mg/dL — ABNORMAL HIGH (ref 8–27)
Bilirubin Total: 0.3 mg/dL (ref 0.0–1.2)
CO2: 23 mmol/L (ref 20–29)
Calcium: 9.5 mg/dL (ref 8.7–10.3)
Chloride: 108 mmol/L — ABNORMAL HIGH (ref 96–106)
Creatinine, Ser: 1.29 mg/dL — ABNORMAL HIGH (ref 0.57–1.00)
Globulin, Total: 2.5 g/dL (ref 1.5–4.5)
Glucose: 120 mg/dL — ABNORMAL HIGH (ref 70–99)
Potassium: 4.8 mmol/L (ref 3.5–5.2)
Sodium: 144 mmol/L (ref 134–144)
Total Protein: 6.6 g/dL (ref 6.0–8.5)
eGFR: 44 mL/min/{1.73_m2} — ABNORMAL LOW (ref >59)

## 2021-08-16 LAB — LIPID PANEL W/O CHOL/HDL RATIO
Cholesterol, Total: 131 mg/dL (ref 100–199)
HDL: 42 mg/dL (ref >39)
LDL Chol Calc (NIH): 64 mg/dL (ref 0–99)
Triglycerides: 143 mg/dL (ref 0–149)
VLDL Cholesterol Cal: 25 mg/dL (ref 5–40)

## 2021-08-16 LAB — VITAMIN B12: Vitamin B-12: 799 pg/mL (ref 232–1245)

## 2021-08-16 LAB — MAGNESIUM: Magnesium: 1.9 mg/dL (ref 1.6–2.3)

## 2021-08-16 LAB — VITAMIN D 25 HYDROXY (VIT D DEFICIENCY, FRACTURES): Vit D, 25-Hydroxy: 42.7 ng/mL (ref 30.0–100.0)

## 2021-08-16 NOTE — Progress Notes (Signed)
Contacted via MyChart   Good morning [name], your labs have returned: - Kidney function, creatinine and eGFR, continues to show ongoing kidney disease stage 3a.  Continue all medications and ensure good water intake at home.  Avoid Ibuprofen products. - Cholesterol levels stable -- continue Atorvastatin - Vitamin D, B12, and your magnesium are all normal.  I love that Mag level staying stable.  Merry Christmas!!  Any questions? Keep being awesome!!  Thank you for allowing me to participate in your care.  I appreciate you. Kindest regards, Atwell Mcdanel

## 2021-08-28 ENCOUNTER — Other Ambulatory Visit: Payer: Self-pay

## 2021-08-28 ENCOUNTER — Encounter: Payer: Self-pay | Admitting: Dermatology

## 2021-08-28 ENCOUNTER — Ambulatory Visit: Payer: Medicare PPO | Admitting: Dermatology

## 2021-08-28 DIAGNOSIS — L918 Other hypertrophic disorders of the skin: Secondary | ICD-10-CM

## 2021-08-28 DIAGNOSIS — D485 Neoplasm of uncertain behavior of skin: Secondary | ICD-10-CM

## 2021-08-28 NOTE — Patient Instructions (Signed)

## 2021-08-28 NOTE — Progress Notes (Signed)
° °  Follow-Up Visit   Subjective  [name] is a 71 y.o. female who presents for the following: Skin Tag (Patient here today for irritated skin tags at left axilla. ).  The following portions of the chart were reviewed this encounter and updated as appropriate:   Tobacco   Allergies   Meds   Problems   Med Hx   Surg Hx   Fam Hx       Review of Systems:  No other skin or systemic complaints except as noted in HPI or Assessment and Plan.  Objective  Well appearing patient in no apparent distress; mood and affect are within normal limits.  A focused examination was performed including arms, axilla. Relevant physical exam findings are noted in the Assessment and Plan.  Left Axilla 0.5 cm erythematous fleshy papule       Assessment & Plan  Neoplasm of uncertain behavior of skin Left Axilla  Epidermal / dermal shaving  Lesion diameter (cm):  0.5 Informed consent: discussed and consent obtained   Timeout: patient name, date of birth, surgical site, and procedure verified   Patient was prepped and draped in usual sterile fashion: area prepped with isopropyl alcohol. Anesthesia: the lesion was anesthetized in a standard fashion   Anesthetic:  1% lidocaine w/ epinephrine 1-100,000 buffered w/ 8.4% NaHCO3 Instrument used: flexible razor blade   Hemostasis achieved with: aluminum chloride   Outcome: patient tolerated procedure well   Post-procedure details: wound care instructions given   Additional details:  Mupirocin and a bandage applied  Specimen 1 - Surgical pathology Differential Diagnosis: r/o irritated nevus vs acrochordon vs neurofibroma  Check Margins: No 0.5 cm erythematous fleshy papule  Acrochordons (Skin Tags) - Removal desired by patient - Fleshy, skin-colored pedunculated papules at left axilla - Benign appearing.  - Patient desires removal. Reviewed that this is not covered by insurance and they will be charged a cosmetic fee for removal. Patient signed  non-covered consent.  - Prior to the procedure, reviewed the expected small wound. Also reviewed the risk of leaving a small scar and the small risk of infection.  PROCEDURE - The areas were prepped with isopropyl alcohol. A small amount of lidocaine 1% with epinephrine was injected at the base of each lesion to achieve good local anesthesia. The skin tags were removed using a snip technique. Aluminum chloride was used for hemostasis. Petrolatum and a bandage were applied. The procedure was tolerated well. - Wound care was reviewed with the patient. They were advised to call with any concerns. Total number of treated acrochordons 6  Return if symptoms worsen or fail to improve.  Graciella Belton, RMA, am acting as scribe for Forest Gleason, MD .  Documentation: I have reviewed the above documentation for accuracy and completeness, and I agree with the above.  Forest Gleason, MD

## 2021-08-29 ENCOUNTER — Telehealth: Payer: Self-pay

## 2021-08-29 NOTE — Telephone Encounter (Signed)
-----   Message from Alfonso Patten, MD sent at 08/29/2021  3:54 PM EST ----- Skin , left axilla ACROCHORDON  MAs please call. Thank you!

## 2021-08-29 NOTE — Telephone Encounter (Signed)
Patient advised bx results showed benign skin tag./js

## 2021-11-08 ENCOUNTER — Other Ambulatory Visit: Payer: Self-pay | Admitting: Nurse Practitioner

## 2021-11-08 NOTE — Telephone Encounter (Signed)
Requested medication (s) are due for refill today:   Yes   Lyrica provider to review ? ?Requested medication (s) are on the active medication list:   Yes for both ? ?Future visit scheduled:   Yes in 5 days with Jolene ? ? ?Last ordered: Atorvastatin 11/07/2020 #90, 4 refills;   Lyrica 08/13/2021 #270, 4 refills ? ?Returned because non delegated refill  ? ?Requested Prescriptions  ?Pending Prescriptions Disp Refills  ? atorvastatin (LIPITOR) 40 MG tablet [Pharmacy Med Name: ATORVASTATIN 40 MG TABLET] 90 tablet 0  ?  Sig: Take 1 tablet (40 mg total) by mouth daily.  ?  ? Cardiovascular:  Antilipid - Statins Failed - 11/08/2021  1:36 PM  ?  ?  Failed - Lipid Panel in normal range within the last 12 months  ?  Cholesterol, Total  ?Date Value Ref Range Status  ?08/15/2021 131 100 - 199 mg/dL Final  ? ?Cholesterol Piccolo, Toone  ?Date Value Ref Range Status  ?03/29/2019 145 <200 mg/dL Final  ?  Comment:  ?                          Desirable                <200 ?                        Borderline High      200- 239 ?                        High                     >239 ?  ? ?LDL Chol Calc (NIH)  ?Date Value Ref Range Status  ?08/15/2021 64 0 - 99 mg/dL Final  ? ?HDL  ?Date Value Ref Range Status  ?08/15/2021 42 >39 mg/dL Final  ? ?Triglycerides  ?Date Value Ref Range Status  ?08/15/2021 143 0 - 149 mg/dL Final  ? ?Triglycerides Piccolo,Waived  ?Date Value Ref Range Status  ?03/29/2019 124 <150 mg/dL Final  ?  Comment:  ?                          Normal                   <150 ?                        Borderline High     150 - 199 ?                        High                200 - 499 ?                        Very High                >499 ?  ? ?  ?  ?  Passed - Patient is not pregnant  ?  ?  Passed - Valid encounter within last 12 months  ?  Recent Outpatient Visits   ? ?      ? 2 months ago Diabetes mellitus associated with hormonal etiology ([place])  ? [facility] [place], [place] T,  NP  ? 4 months ago Acute cough   ? [facility] [place], [place] T, NP  ? 4 months ago Chronic left-sided low back pain with left-sided sciatica  ? [place], Lavinia T, NP  ? 5 months ago Elevated TSH  ? [place], [place] T, NP  ? 8 months ago Diabetes mellitus associated with hormonal etiology ([place])  ? [facility] [place], [name], NP  ? ?  ?  ?Future Appointments   ? ?        ? In 5 days Kelsey Lick, NP [facility] [facility], [name]  ? In 7 months  [place], [name]  ? ?  ? ?  ?  ?  ? pregabalin (LYRICA) 50 MG capsule [Pharmacy Med Name: PREGABALIN 50 MG CAPSULE] 270 capsule 0  ?  Sig: Take 1 capsule (50 mg total) by mouth 3 (three) times daily.  ?  ? Not Delegated - Neurology:  Anticonvulsants - Controlled - pregabalin Failed - 11/08/2021  1:36 PM  ?  ?  Failed - This refill cannot be delegated  ?  ?  Failed - Cr in normal range and within 360 days  ?  Creatinine, Ser  ?Date Value Ref Range Status  ?08/15/2021 1.29 (H) 0.57 - 1.00 mg/dL Final  ?  ?  ?  ?  Passed - Completed PHQ-2 or PHQ-9 in the last 360 days  ?  ?  Passed - Valid encounter within last 12 months  ?  Recent Outpatient Visits   ? ?      ? 2 months ago Diabetes mellitus associated with hormonal etiology ([place])  ? [place], [place] T, NP  ? 4 months ago Acute cough  ? [facility] [place], [place] T, NP  ? 4 months ago Chronic left-sided low back pain with left-sided sciatica  ? [place], [place] T, NP  ? 5 months ago Elevated TSH  ? [place], [place] T, NP  ? 8 months ago Diabetes mellitus associated with hormonal etiology ([place])  ? [facility] [place], [name], NP  ? ?  ?  ?Future Appointments   ? ?        ? In 5 days Kelsey Lick, NP [facility], [name]  ? In 7 months  [place], [name]  ? ?  ? ?  ?  ?  ? ?

## 2021-11-09 NOTE — Patient Instructions (Addendum)
Please call to schedule your mammogram and/or bone density: ?[facility]  ?Address: [address], [place],  [address] ?Phone: [phone]  ? ?Diabetes Mellitus and Nutrition, Adult ?When you have diabetes, or diabetes mellitus, it is very important to have healthy eating habits because your blood sugar (glucose) levels are greatly affected by what you eat and drink. Eating healthy foods in the right amounts, at about the same times every day, can help you: ?Manage your blood glucose. ?Lower your risk of heart disease. ?Improve your blood pressure. ?Reach or maintain a healthy weight. ?What can affect my meal plan? ?Every person with diabetes is different, and each person has different needs for a meal plan. Your health care provider may recommend that you work with a dietitian to make a meal plan that is best for you. Your meal plan may vary depending on factors such as: ?The calories you need. ?The medicines you take. ?Your weight. ?Your blood glucose, blood pressure, and cholesterol levels. ?Your activity level. ?Other health conditions you have, such as heart or kidney disease. ?How do carbohydrates affect me? ?Carbohydrates, also called carbs, affect your blood glucose level more than any other type of food. Eating carbs raises the amount of glucose in your blood. ?It is important to know how many carbs you can safely have in each meal. This is different for every person. Your dietitian can help you calculate how many carbs you should have at each meal and for each snack. ?How does alcohol affect me? ?Alcohol can cause a decrease in blood glucose (hypoglycemia), especially if you use insulin or take certain diabetes medicines by mouth. Hypoglycemia can be a life-threatening condition. Symptoms of hypoglycemia, such as sleepiness, dizziness, and confusion, are similar to symptoms of having too much alcohol. ?Do not drink alcohol if: ?Your health care provider  tells you not to drink. ?You are pregnant, may be pregnant, or are planning to become pregnant. ?If you drink alcohol: ?Limit how much you have to: ?0-1 drink a day for women. ?0-2 drinks a day for men. ?Know how much alcohol is in your drink. In the [place] one drink equals one 12 oz bottle of beer (355 mL), one 5 oz glass of wine (148 mL), or one 1? oz glass of hard liquor (44 mL). ?Keep yourself hydrated with water, diet soda, or unsweetened iced tea. Keep in mind that regular soda, juice, and other mixers may contain a lot of sugar and must be counted as carbs. ?What are tips for following this plan? ?Reading food labels ?Start by checking the serving size on the Nutrition Facts label of packaged foods and drinks. The number of calories and the amount of carbs, fats, and other nutrients listed on the label are based on one serving of the item. Many items contain more than one serving per package. ?Check the total grams (g) of carbs in one serving. ?Check the number of grams of saturated fats and trans fats in one serving. Choose foods that have a low amount or none of these fats. ?Check the number of milligrams (mg) of salt (sodium) in one serving. Most people should limit total sodium intake to less than 2,300 mg per day. ?Always check the nutrition information of foods labeled as "low-fat" or "nonfat." These foods may be higher in added sugar or refined carbs and should be avoided. ?Talk to your dietitian to identify your daily goals for nutrients listed on the label. ?Shopping ?Avoid buying  canned, pre-made, or processed foods. These foods tend to be high in fat, sodium, and added sugar. ?Shop around the outside edge of the grocery store. This is where you will most often find fresh fruits and vegetables, bulk grains, fresh meats, and fresh dairy products. ?Cooking ?Use low-heat cooking methods, such as baking, instead of high-heat cooking methods, such as deep frying. ?Cook using healthy oils, such as olive,  canola, or sunflower oil. ?Avoid cooking with butter, cream, or high-fat meats. ?Meal planning ?Eat meals and snacks regularly, preferably at the same times every day. Avoid going long periods of time without eating. ?Eat foods that are high in fiber, such as fresh fruits, vegetables, beans, and whole grains. ?Eat 4-6 oz (112-168 g) of lean protein each day, such as lean meat, chicken, fish, eggs, or tofu. One ounce (oz) (28 g) of lean protein is equal to: ?1 oz (28 g) of meat, chicken, or fish. ?1 egg. ?? cup (62 g) of tofu. ?Eat some foods each day that contain healthy fats, such as avocado, nuts, seeds, and fish. ?What foods should I eat? ?Fruits ?Berries. Apples. Oranges. Peaches. Apricots. Plums. Grapes. Mangoes. Papayas. Pomegranates. Kiwi. Cherries. ?Vegetables ?Leafy greens, including lettuce, spinach, kale, chard, collard greens, mustard greens, and cabbage. Beets. Cauliflower. Broccoli. Carrots. Green beans. Tomatoes. Peppers. Onions. Cucumbers. Brussels sprouts. ?Grains ?Whole grains, such as whole-wheat or whole-grain bread, crackers, tortillas, cereal, and pasta. Unsweetened oatmeal. Quinoa. Brown or wild rice. ?Meats and other proteins ?Seafood. Poultry without skin. Lean cuts of poultry and beef. Tofu. Nuts. Seeds. ?Dairy ?Low-fat or fat-free dairy products such as milk, yogurt, and cheese. ?The items listed above may not be a complete list of foods and beverages you can eat and drink. Contact a dietitian for more information. ?What foods should I avoid? ?Fruits ?Fruits canned with syrup. ?Vegetables ?Canned vegetables. Frozen vegetables with butter or cream sauce. ?Grains ?Refined white flour and flour products such as bread, pasta, snack foods, and cereals. Avoid all processed foods. ?Meats and other proteins ?Fatty cuts of meat. Poultry with skin. Breaded or fried meats. Processed meat. Avoid saturated fats. ?Dairy ?Full-fat yogurt, cheese, or milk. ?Beverages ?Sweetened drinks, such as soda or  iced tea. ?The items listed above may not be a complete list of foods and beverages you should avoid. Contact a dietitian for more information. ?Questions to ask a health care provider ?Do I need to meet with a certified diabetes care and education specialist? ?Do I need to meet with a dietitian? ?What number can I call if I have questions? ?When are the best times to check my blood glucose? ?Where to find more information: ?American Diabetes Association: diabetes.org ?Academy of Nutrition and Dietetics: eatright.org ?[employer] of Diabetes and Digestive and Kidney Diseases: AmenCredit.is ?Association of Diabetes Care & Education Specialists: diabeteseducator.org ?Summary ?It is important to have healthy eating habits because your blood sugar (glucose) levels are greatly affected by what you eat and drink. It is important to use alcohol carefully. ?A healthy meal plan will help you manage your blood glucose and lower your risk of heart disease. ?Your health care provider may recommend that you work with a dietitian to make a meal plan that is best for you. ?This information is not intended to replace advice given to you by your health care provider. Make sure you discuss any questions you have with your health care provider. ?Document Revised: 03/15/2020 Document Reviewed: 03/15/2020 ?Elsevier Patient Education ? [place]. ? ?

## 2021-11-12 ENCOUNTER — Other Ambulatory Visit: Payer: Self-pay | Admitting: Nurse Practitioner

## 2021-11-12 DIAGNOSIS — G620 Drug-induced polyneuropathy: Secondary | ICD-10-CM

## 2021-11-13 ENCOUNTER — Encounter: Payer: Self-pay | Admitting: Nurse Practitioner

## 2021-11-13 ENCOUNTER — Other Ambulatory Visit: Payer: Self-pay

## 2021-11-13 ENCOUNTER — Ambulatory Visit (INDEPENDENT_AMBULATORY_CARE_PROVIDER_SITE_OTHER): Payer: Medicare PPO | Admitting: Nurse Practitioner

## 2021-11-13 VITALS — BP 124/78 | HR 64 | Temp 97.8°F | Ht 63.5 in | Wt 228.0 lb

## 2021-11-13 DIAGNOSIS — E1159 Type 2 diabetes mellitus with other circulatory complications: Secondary | ICD-10-CM

## 2021-11-13 DIAGNOSIS — E785 Hyperlipidemia, unspecified: Secondary | ICD-10-CM

## 2021-11-13 DIAGNOSIS — E1169 Type 2 diabetes mellitus with other specified complication: Secondary | ICD-10-CM

## 2021-11-13 DIAGNOSIS — Z1231 Encounter for screening mammogram for malignant neoplasm of breast: Secondary | ICD-10-CM

## 2021-11-13 DIAGNOSIS — E559 Vitamin D deficiency, unspecified: Secondary | ICD-10-CM

## 2021-11-13 DIAGNOSIS — I152 Hypertension secondary to endocrine disorders: Secondary | ICD-10-CM

## 2021-11-13 DIAGNOSIS — G620 Drug-induced polyneuropathy: Secondary | ICD-10-CM | POA: Diagnosis not present

## 2021-11-13 DIAGNOSIS — Z0000 Encounter for general adult medical examination without abnormal findings: Secondary | ICD-10-CM

## 2021-11-13 DIAGNOSIS — G5602 Carpal tunnel syndrome, left upper limb: Secondary | ICD-10-CM

## 2021-11-13 DIAGNOSIS — Z9889 Other specified postprocedural states: Secondary | ICD-10-CM | POA: Insufficient documentation

## 2021-11-13 DIAGNOSIS — T451X5A Adverse effect of antineoplastic and immunosuppressive drugs, initial encounter: Secondary | ICD-10-CM

## 2021-11-13 DIAGNOSIS — R7989 Other specified abnormal findings of blood chemistry: Secondary | ICD-10-CM

## 2021-11-13 LAB — BAYER DCA HB A1C WAIVED: HB A1C (BAYER DCA - WAIVED): 5.9 % — ABNORMAL HIGH (ref 4.8–5.6)

## 2021-11-13 LAB — MICROALBUMIN, URINE WAIVED
Creatinine, Urine Waived: 200 mg/dL (ref 10–300)
Microalb, Ur Waived: 10 mg/L (ref 0–19)
Microalb/Creat Ratio: <30 mg/g (ref <30)

## 2021-11-13 MED ORDER — ATENOLOL 50 MG PO TABS: 50.0000 mg | ORAL_TABLET | Freq: Every day | ORAL | 4 refills | Status: DC

## 2021-11-13 MED ORDER — ATORVASTATIN CALCIUM 40 MG PO TABS: 40.0000 mg | ORAL_TABLET | Freq: Every day | ORAL | 4 refills | Status: DC

## 2021-11-13 MED ORDER — AMILORIDE HCL 5 MG PO TABS: 5.0000 mg | ORAL_TABLET | Freq: Every day | ORAL | 4 refills | Status: DC

## 2021-11-13 MED ORDER — FLUTICASONE PROPIONATE 50 MCG/ACT NA SUSP: 2.0000 | Freq: Every day | NASAL | 12 refills | Status: DC

## 2021-11-13 MED ORDER — MONTELUKAST SODIUM 10 MG PO TABS: 10.0000 mg | ORAL_TABLET | Freq: Every day | ORAL | 4 refills | Status: DC

## 2021-11-13 MED ORDER — PREGABALIN 50 MG PO CAPS: 50.0000 mg | ORAL_CAPSULE | Freq: Three times a day (TID) | ORAL | 4 refills | Status: DC

## 2021-11-13 NOTE — Assessment & Plan Note (Signed)
Chronic, ongoing.  Continue current medication regimen and adjust as needed.  Lipid panel ordered today. ?

## 2021-11-13 NOTE — Assessment & Plan Note (Addendum)
Patient c/o increased numbness on hands more on the left hands. Sometimes has difficulty griping things and picking items up. 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 Referral made to ortho ?

## 2021-11-13 NOTE — Assessment & Plan Note (Signed)
Chronic, ongoing with A1C today 5.9%. Urine micro ALB 10 and A:C <30 today. Continue current medication regimen as A1C at goal and no hypoglycemia at home.Continue to monitor BS at home daily.   Return in 3 months for visit. ?

## 2021-11-13 NOTE — Assessment & Plan Note (Signed)
Last level was 1.9, normal. Continue amiloride and follow up with nephrology. Magnesium rechecked today ?

## 2021-11-13 NOTE — Assessment & Plan Note (Signed)
Chronic, ongoing. States she feels Lyrica is not as effective as before. Discussed the option of slightly increasing dosage if needed, patient states she is ok with current dosage. Continue Lyrica and adjust as needed, especially with aging monitor dose closely.  Refills up to date.  Return in 3 months. ?

## 2021-11-13 NOTE — Assessment & Plan Note (Signed)
Noted past labs, continue supplement and recheck today. ?

## 2021-11-13 NOTE — Progress Notes (Signed)
? ?BP 124/78   Pulse 64   Temp 97.8 ?F (36.6 ?C) (Oral)   Ht 5' 3.5" (1.613 m)   Wt 228 lb (103.4 kg)   SpO2 98%   BMI 39.75 kg/m?   ? ?NOTE WRITTEN BY [employer] DNP STUDENT.  ASSESSMENT AND PLAN OF CARE REVIEWED WITH STUDENT, AGREE WITH ABOVE FINDINGS AND PLAN.  ? ?Subjective:  ? ? Patient ID: [name], female    DOB: 06/11/1951, 71 y.o.   MRN: [id number] ? ?HPI: ?[name] is a 71 y.o. female presenting on 11/13/2021 for comprehensive medical examination. Current medical complaints include: left hand numbness ? ?Patient notice increased numbness on hands more on the left hands. Sometimes has difficulty griping things and picking items up. Sees chiropractor which helps with neuropathy but not numbness ? ?She currently lives with: husband ?Menopausal Symptoms: no ? ?DIABETES ?Continues on Jardiance 25 MG daily, previously took Metformin, but switched to SGLT2 for heart health.  Last A1c December 6.1%.   ?Hypoglycemic episodes:no ?Polydipsia/polyuria: no ?Visual disturbance: no ?Chest pain: no ?Paresthesias: yes bilateral hands this is chronic ?Glucose Monitoring:  ? Accucheck frequency: Daily ? Fasting glucose: 110s ? Post prandial: ? Evening: ? Before meals: ?Taking Insulin?: no ? Long acting insulin: ? Short acting insulin: ?Blood Pressure Monitoring: weekly ?Retinal Examination: Not up to Date, plan to go to Eye doctor soon ?Foot Exam: done today ?Diabetic Education: Completed ?Pneumovax: Up to Date ?Influenza: Up to Date ?Aspirin: no  ? ?NEUROPATHY ?Taking Lyrica 50 MG TID. ?Neuropathy status: controlled  ?Satisfied with current treatment?: yes, but states not as effective ?Medication side effects: no ?Medication compliance:  excellent compliance ?Location: multiple locations ?Pain: yes ?Severity: moderate  ?Quality:  sharp, burning, tender, tearing, throbbing, tingling, creeping, and pins and needles ?Frequency: intermittent ?Bilateral: yes ?Symmetric: yes ?Numbness: yes ?Decreased sensation: yes ?Weakness:  no ?Context: stable ?Alleviating factors:  ?Aggravating factors:  ?Treatments attempted:  ? ?HYPERTENSION / HYPERLIPIDEMIA ?Has pacemaker, PPM [employer] -- 3 years on pacemaker. Followed by cardiology and last seen 02/13/21.   ?Current medications: Atenolol, Amiloride, Atorvastatin. ?Satisfied with current treatment? yes ?Duration of hypertension: chronic ?BP monitoring frequency: weekly ?BP range:  ?BP medication side effects: no ?Past BP meds:  ?Duration of hyperlipidemia: chronic ?Cholesterol medication side effects: no ?Cholesterol supplements: none ?Past cholesterol medications:  ?Medication compliance: excellent compliance ?Aspirin: no ?Recent stressors: yes ?Recurrent headaches: chronic intermittent  ?Visual changes: no ?Palpitations: no ?Dyspnea: no ?Chest pain: no ?Lower extremity edema: yes ?Dizzy/lightheaded: no  ? ?HISTORY OF BREAST CA: ?Ongoing neuropathy and hypomagnesemia due to cancer history, was followed by oncology in past.  Received magnesium infusions, at this time is transitioned to PCP monitoring. Takes Lyrica for neuropathy pain + magnesium supplement daily with recent level improved at 1.9 September.  Does take Nexium for GERD -- takes daily.  She does endorse some increase nerve pain to left hand at this time. ? ?ELEVATED TSH ?Noted on past two labs with recent within normal range in September.  There is a family history of thyroid issues.  History of lower B12 and Vitamin D levels past labs. ?Fatigue: yes due to holidays ?Cold intolerance: no ?Heat intolerance: no ?Weight gain: no ?Weight loss: no ?Constipation: no ?Diarrhea/loose stools: no ?Palpitations: no ?Lower extremity edema: no ?Anxiety/depressed mood: no ? ?Depression Screen done today and results listed below:  ?Depression screen [facility] 2/9 11/13/2021 08/15/2021 06/25/2021 11/07/2020 06/23/2020  ?Decreased Interest 0 0 0 0 0  ?Down, Depressed, Hopeless 0 0  0 0 0  ?PHQ - 2 Score 0 0 0 0 0  ?Altered sleeping 1 0 - 0 0  ?Tired,  decreased energy 1 1 - 0 0  ?Change in appetite 0 0 - 0 0  ?Feeling bad or failure about yourself  0 0 - 0 0  ?Trouble concentrating 0 0 - 0 0  ?Moving slowly or fidgety/restless 0 0 - 0 0  ?Suicidal thoughts 0 0 - 0 0  ?PHQ-9 Score 2 1 - 0 0  ?Difficult doing work/chores - Not difficult at all - - Not difficult at all  ?Some recent data might be hidden  ? ? ?The patient does not have a history of falls. I did not complete a risk assessment for falls. A plan of care for falls was not documented. ? ? ?Past Medical History:  ?Past Medical History:  ?Diagnosis Date  ? Arthritis   ? Breast cancer, left ([place]) 09/2007  ? s/p lumpectomy and chemoradiation  ? Cardiomyopathy secondary   ? GERD (gastroesophageal reflux disease)   ? Glaucoma   ? History of first degree AV block   ? high grade; PPM placed  ? History of kidney stones   ? Hypersomnolence 09/21/2013  ? Hypertension   ? Hypothyroidism   ? no meds currently  ? Leg edema, left 09/21/2013  ? 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 Migraines   ? Neuropathy   ? hands from chemo  ? Ovarian cyst 11/06/2020  ? Pacemaker [employer] 2004  ? Personal history of chemotherapy 2009  ? left breast ca  ? Personal history of radiation therapy 2009  ? left breast  ? Sinus tachycardia   ? Type 2 diabetes mellitus ([place]) 06/19/2015  ? ? ?Surgical History:  ?Past Surgical History:  ?Procedure Laterality Date  ? ANKLE SURGERY Left   ? x 3  ? BREAST EXCISIONAL BIOPSY Left 2009  ?  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 BREAST LUMPECTOMY Left 2009  ? chemo and rad tx  ? [place]  ? CHOLECYSTECTOMY    ? EYE SURGERY Left 12/2018  ? cataracts-Dr.Shah in [place]   ? EYE SURGERY Right 01/2019  ? cataracts- Dr.Shah in [place]  ? GALLBLADDER SURGERY  1993  ? INSERT / REPLACE / REMOVE PACEMAKER  2011  ? revision @ [facility]  ? PACEMAKER INSERTION  05/13/2003  ? [employer] [facility] Comp - DR  ? ROBOTIC ASSISTED  SALPINGO OOPHERECTOMY Right 01/18/2021  ? Procedure: XI ROBOTIC ASSISTED RIGHT SALPINGO-OOPHORECTOMY;  Surgeon: Homero Fellers, MD;  Location: [facility] ORS;  Service: Gynecology;  Laterality: Right;  ? ? ?Medications:  ?Current Outpatient Medications on File Prior to Visit  ?Medication Sig  ? aspirin-acetaminophen-caffeine (EXCEDRIN MIGRAINE) 250-250-65 MG tablet Take 2 tablets by mouth every 6 (six) hours as needed for headache or migraine.  ? azelastine (ASTELIN) 0.1 % nasal spray Place 1 spray into both nostrils 2 (two) times daily. Use in each nostril as directed  ? Cholecalciferol (VITAMIN D3) 50 MCG (2000 UT) TABS Take 1 tablet by mouth daily.  ? CINNAMON PO Take 3 capsules by mouth in the morning.  ? CRANBERRY-VITAMIN C PO Take 1 tablet by mouth daily.  ? Cyanocobalamin (VITAMIN B-12 PO) Take 1 tablet by mouth every other day.  ? empagliflozin (JARDIANCE) 25 MG TABS tablet Take 1 tablet (25 mg total) by mouth daily before breakfast.  ? esomeprazole (NEXIUM) 20 MG  capsule Take 20 mg by mouth every morning.  ? glucose blood (ACCU-CHEK AVIVA PLUS) test strip TEST BLOOD SUGAR ONCE DAILY  ? levocetirizine (XYZAL) 5 MG tablet Take 5 mg by mouth every evening.  ? MAGNESIUM CHLORIDE-CALCIUM PO Take 3 tablets by mouth daily.  ? magnesium oxide (MAG-OX) 400 MG tablet Take 1,200 mg by mouth daily.  ? melatonin 3 MG TABS tablet Take 3 mg by mouth at bedtime.  ? mupirocin ointment (BACTROBAN) 2 % Apply 1 application topically 2 (two) times daily. Until healed  ? nystatin (MYCOSTATIN/NYSTOP) powder Apply 1 application topically 2 (two) times daily.  ? SUMAtriptan (IMITREX) 100 MG tablet Take 1 tablet (100 mg total) by mouth as needed.  ? ?No current facility-administered medications on file prior to visit.  ? ? ?Allergies:  ?Allergies  ?Allergen Reactions  ? Penicillins Swelling  ?  Causes organs to swell ?Other reaction(s): Other (See Comments) ?Organs flipped over per patient; skin starting splitting open and blood  vessels burst in legs;  ?Causes organs to swell  ? Levofloxacin Swelling  ?  Leg swelling and muscle weakness ?Other reaction(s): Other (See Comments) ?Tendon pain and weakness in muscles ?Leg swelling and muscle we

## 2021-11-13 NOTE — Assessment & Plan Note (Signed)
Chronic, ongoing with BP at goal 124/78 todayl.  Continue current medication regimen and adjust as needed + continue collaboration with cardiology.  CMP ordered today.  Recommend she continue to monitor BP at home and focus on DASH diet.  Return in 3 months. ?

## 2021-11-13 NOTE — Assessment & Plan Note (Signed)
BMI 39.75 with T2DM and HTN.  Recommended eating smaller high protein, low fat meals more frequently and exercising 30 mins a day 5 times a week with a goal of 10-15 lb weight loss in the next 3 months. Patient voiced their understanding and motivation to adhere to these recommendations. ? ?

## 2021-11-13 NOTE — Assessment & Plan Note (Signed)
Noted on past labs, patient is assymptomatic.  TSH and free T4 levels checked today.  ?

## 2021-11-14 LAB — CBC WITH DIFFERENTIAL/PLATELET
Basophils Absolute: 0.1 10*3/uL (ref 0.0–0.2)
Basos: 1 %
EOS (ABSOLUTE): 0.3 10*3/uL (ref 0.0–0.4)
Eos: 4 %
Hematocrit: 39.9 % (ref 34.0–46.6)
Hemoglobin: 13.1 g/dL (ref 11.1–15.9)
Immature Grans (Abs): 0 10*3/uL (ref 0.0–0.1)
Immature Granulocytes: 1 %
Lymphocytes Absolute: 1.6 10*3/uL (ref 0.7–3.1)
Lymphs: 27 %
MCH: 28.9 pg (ref 26.6–33.0)
MCHC: 32.8 g/dL (ref 31.5–35.7)
MCV: 88 fL (ref 79–97)
Monocytes Absolute: 0.5 10*3/uL (ref 0.1–0.9)
Monocytes: 8 %
Neutrophils Absolute: 3.4 10*3/uL (ref 1.4–7.0)
Neutrophils: 59 %
Platelets: 178 10*3/uL (ref 150–450)
RBC: 4.54 x10E6/uL (ref 3.77–5.28)
RDW: 13.9 % (ref 11.7–15.4)
WBC: 5.7 10*3/uL (ref 3.4–10.8)

## 2021-11-14 LAB — COMPREHENSIVE METABOLIC PANEL
ALT: 23 IU/L (ref 0–32)
AST: 20 IU/L (ref 0–40)
Albumin/Globulin Ratio: 2.5 — ABNORMAL HIGH (ref 1.2–2.2)
Albumin: 4.5 g/dL (ref 3.7–4.7)
Alkaline Phosphatase: 133 IU/L — ABNORMAL HIGH (ref 44–121)
BUN/Creatinine Ratio: 26 (ref 12–28)
BUN: 29 mg/dL — ABNORMAL HIGH (ref 8–27)
Bilirubin Total: 0.4 mg/dL (ref 0.0–1.2)
CO2: 21 mmol/L (ref 20–29)
Calcium: 9.4 mg/dL (ref 8.7–10.3)
Chloride: 106 mmol/L (ref 96–106)
Creatinine, Ser: 1.13 mg/dL — ABNORMAL HIGH (ref 0.57–1.00)
Globulin, Total: 1.8 g/dL (ref 1.5–4.5)
Glucose: 108 mg/dL — ABNORMAL HIGH (ref 70–99)
Potassium: 4.6 mmol/L (ref 3.5–5.2)
Sodium: 142 mmol/L (ref 134–144)
Total Protein: 6.3 g/dL (ref 6.0–8.5)
eGFR: 52 mL/min/{1.73_m2} — ABNORMAL LOW (ref >59)

## 2021-11-14 LAB — LIPID PANEL W/O CHOL/HDL RATIO
Cholesterol, Total: 131 mg/dL (ref 100–199)
HDL: 44 mg/dL (ref >39)
LDL Chol Calc (NIH): 60 mg/dL (ref 0–99)
Triglycerides: 156 mg/dL — ABNORMAL HIGH (ref 0–149)
VLDL Cholesterol Cal: 27 mg/dL (ref 5–40)

## 2021-11-14 LAB — TSH: TSH: 4 u[IU]/mL (ref 0.450–4.500)

## 2021-11-14 LAB — T4, FREE: Free T4: 1.12 ng/dL (ref 0.82–1.77)

## 2021-11-14 LAB — MAGNESIUM: Magnesium: 1.8 mg/dL (ref 1.6–2.3)

## 2021-11-14 NOTE — Telephone Encounter (Signed)
Both medications were refilled 11/13/2021 with 1 year supply - duplicate request. ?Requested Prescriptions  ?Pending Prescriptions Disp Refills  ?? atorvastatin (LIPITOR) 40 MG tablet [Pharmacy Med Name: ATORVASTATIN 40 MG TABLET] 90 tablet 0  ?  Sig: Take 1 tablet (40 mg total) by mouth daily.  ?  ? Cardiovascular:  Antilipid - Statins Failed - 11/12/2021 11:50 AM  ?  ?  Failed - Lipid Panel in normal range within the last 12 months  ?  Cholesterol, Total  ?Date Value Ref Range Status  ?11/13/2021 131 100 - 199 mg/dL Final  ? ?Cholesterol Piccolo, [place]  ?Date Value Ref Range Status  ?03/29/2019 145 <200 mg/dL Final  ?  Comment:  ?                          Desirable                <200 ?                        Borderline High      200- 239 ?                        High                     >239 ?  ? ?LDL Chol Calc (NIH)  ?Date Value Ref Range Status  ?11/13/2021 60 0 - 99 mg/dL Final  ? ?HDL  ?Date Value Ref Range Status  ?11/13/2021 44 >39 mg/dL Final  ? ?Triglycerides  ?Date Value Ref Range Status  ?11/13/2021 156 (H) 0 - 149 mg/dL Final  ? ?Triglycerides Piccolo,Waived  ?Date Value Ref Range Status  ?03/29/2019 124 <150 mg/dL Final  ?  Comment:  ?                          Normal                   <150 ?                        Borderline High     150 - 199 ?                        High                200 - 499 ?                        Very High                >499 ?  ? ?  ?  ?  Passed - Patient is not pregnant  ?  ?  Passed - Valid encounter within last 12 months  ?  Recent Outpatient Visits   ?      ? Yesterday Diabetes mellitus associated with hormonal etiology ([place])  ? [place], [place] T, NP  ? 3 months ago Diabetes mellitus associated with hormonal etiology ([place])  ? Raymond, [place] T, NP  ? 4 months ago Acute cough  ? [facility] [place], [place] T, NP  ? 4 months ago Chronic left-sided low back pain with left-sided sciatica  ? [facility] [place], [place]  T, NP  ? 6 months ago Elevated TSH  ? [facility] [place], [name], NP  ?  ?  ?Future Appointments   ?        ? In 3 months [name], [name], NP [facility], [name]  ? In 7 months  [name]  ?  ? ?  ?  ?  ?? pregabalin (LYRICA) 50 MG capsule [Pharmacy Med Name: PREGABALIN 50 MG CAPSULE] 270 capsule 0  ?  Sig: Take 1 capsule (50 mg total) by mouth 3 (three) times daily.  ?  ? Not Delegated - Neurology:  Anticonvulsants - Controlled - pregabalin Failed - 11/12/2021 11:50 AM  ?  ?  Failed - This refill cannot be delegated  ?  ?  Failed - Cr in normal range and within 360 days  ?  Creatinine, Ser  ?Date Value Ref Range Status  ?11/13/2021 1.13 (H) 0.57 - 1.00 mg/dL Final  ?   ?  ?  Passed - Completed PHQ-2 or PHQ-9 in the last 360 days  ?  ?  Passed - Valid encounter within last 12 months  ?  Recent Outpatient Visits   ?      ? Yesterday Diabetes mellitus associated with hormonal etiology ([place])  ? Merrill, Pepin T, NP  ? 3 months ago Diabetes mellitus associated with hormonal etiology ([place])  ? [place], [place] T, NP  ? 4 months ago Acute cough  ? [facility] [place], [place] T, NP  ? 4 months ago Chronic left-sided low back pain with left-sided sciatica  ? Arthur, [place] T, NP  ? 6 months ago Elevated TSH  ? [facility] [place], [name], NP  ?  ?  ?Future Appointments   ?        ? In 3 months [name], [name], NP [facility], [name]  ? In 7 months  [name]  ?  ? ?  ?  ?  ? ?

## 2021-11-14 NOTE — Progress Notes (Signed)
Contacted via [place] ? ? ?Good evening [name], your labs have returned: ?- Kidney function continues to show stable kidney disease with no worsening = creatinine and eGFR.  Liver function, AST and ALT, is stable. ?- Cholesterol labs remains stable, continue Atorvastatin. ?- CBC shows no anemia or infection. ?- Thyroid labs, TSH and Free T4, are normal = no medications needed ?- Magnesium level remains stable, I suspect Jardiance may be helping with this too.  We will continue to monitor.  Any questions? ?Keep being stellar!!  Thank you for allowing me to participate in your care.  I appreciate you. ?Kindest regards, ?Atticus Wedin ?

## 2022-01-16 ENCOUNTER — Ambulatory Visit
Admission: RE | Admit: 2022-01-16 | Discharge: 2022-01-16 | Disposition: A | Discharge status: Home / Self Care | Payer: Medicare PPO | Source: Ambulatory Visit | Attending: Nurse Practitioner | Admitting: Nurse Practitioner

## 2022-01-16 DIAGNOSIS — Z1231 Encounter for screening mammogram for malignant neoplasm of breast: Secondary | ICD-10-CM | POA: Insufficient documentation

## 2022-01-17 NOTE — Progress Notes (Signed)
Contacted via MyChart   Normal mammogram, may repeat in one year:)

## 2022-02-09 NOTE — Patient Instructions (Signed)

## 2022-02-13 ENCOUNTER — Ambulatory Visit: Payer: Medicare PPO | Admitting: Nurse Practitioner

## 2022-02-13 ENCOUNTER — Encounter: Payer: Self-pay | Admitting: Nurse Practitioner

## 2022-02-13 VITALS — BP 123/77 | HR 71 | Temp 97.7°F | Ht 63.5 in | Wt 228.0 lb

## 2022-02-13 DIAGNOSIS — I440 Atrioventricular block, first degree: Secondary | ICD-10-CM

## 2022-02-13 DIAGNOSIS — E1169 Type 2 diabetes mellitus with other specified complication: Secondary | ICD-10-CM

## 2022-02-13 DIAGNOSIS — Z853 Personal history of malignant neoplasm of breast: Secondary | ICD-10-CM

## 2022-02-13 DIAGNOSIS — Z950 Presence of cardiac pacemaker: Secondary | ICD-10-CM

## 2022-02-13 DIAGNOSIS — E785 Hyperlipidemia, unspecified: Secondary | ICD-10-CM

## 2022-02-13 DIAGNOSIS — E1159 Type 2 diabetes mellitus with other circulatory complications: Secondary | ICD-10-CM | POA: Diagnosis not present

## 2022-02-13 DIAGNOSIS — T451X5A Adverse effect of antineoplastic and immunosuppressive drugs, initial encounter: Secondary | ICD-10-CM

## 2022-02-13 DIAGNOSIS — G620 Drug-induced polyneuropathy: Secondary | ICD-10-CM

## 2022-02-13 DIAGNOSIS — Z9889 Other specified postprocedural states: Secondary | ICD-10-CM

## 2022-02-13 DIAGNOSIS — I152 Hypertension secondary to endocrine disorders: Secondary | ICD-10-CM

## 2022-02-13 LAB — BAYER DCA HB A1C WAIVED: HB A1C (BAYER DCA - WAIVED): 6.1 % — ABNORMAL HIGH (ref 4.8–5.6)

## 2022-02-13 MED ORDER — NYSTATIN 100000 UNIT/GM EX POWD: 1.0000 | Freq: Two times a day (BID) | CUTANEOUS | 4 refills | Status: AC

## 2022-02-13 MED ORDER — EMPAGLIFLOZIN 25 MG PO TABS: 25.0000 mg | ORAL_TABLET | Freq: Every day | ORAL | 4 refills | Status: DC

## 2022-02-13 NOTE — Assessment & Plan Note (Signed)
Chronic, ongoing with A1C today 6.1%. Urine micro ALB 10 and A:C <30 in March 2023. Continue current medication regimen as A1c at goal and no hypoglycemia at home. If continued stable A1c trend may consider reduction of medication.  Continue to monitor BS at home daily.   Return in 3 months for visit.

## 2022-02-13 NOTE — Assessment & Plan Note (Signed)
Continue collaboration with orthopedics, recent surgery.

## 2022-02-13 NOTE — Progress Notes (Signed)
BP 123/77   Pulse 71   Temp 97.7 F (36.5 C) (Oral)   Ht 5' 3.5" (1.613 m)   Wt 228 lb (103.4 kg)   SpO2 98%   BMI 39.75 kg/m    Subjective:    Patient ID: [name], female    DOB: 1951-04-26, 71 y.o.   MRN: [id number]  HPI: [name] is a 71 y.o. female  Chief Complaint  Patient presents with   Diabetes    Patient states she is scheduled for July.    Hyperlipidemia   Hypertension   Lab Check    Recent carpal tunnel surgery yesterday morning to left wrist.  DIABETES On A1c in March level was 5.9%.  She continues on Jardiance 25 MG daily, which also offers heart benefit.  Has history of breast cancer and takes Lyrica TID for chemotherapy-induced neuropathy.  Was followed by oncology and now to return as needed, history of low magnesium that they monitored but recent levels have been stable -- taking Magnesium and Amiloride. Hypoglycemic episodes:no Polydipsia/polyuria: no Visual disturbance: no Chest pain: no Paresthesias: no Glucose Monitoring: yes  Accucheck frequency: Daily  Fasting glucose: 118 this morning, often in this range  Post prandial:  Evening:  Before meals: Taking Insulin?: no  Long acting insulin:  Short acting insulin: Blood Pressure Monitoring: a few times a month Retinal Examination: Up to Date -- returns in July Foot Exam: Up to Date Pneumovax: Up to Date Influenza: Up to Date Aspirin: yes   HYPERTENSION / HYPERLIPIDEMIA Continues on Atenolol and Atorvastatin.  Followed by cardiology with last visit 02/13/21.  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 Satisfied with current treatment? yes Duration of hypertension: chronic BP monitoring frequency: a few times a month BP range: 120/70 range BP medication side effects: no Duration of hyperlipidemia: chronic Cholesterol medication side effects: no Cholesterol supplements: none Medication compliance: good compliance Aspirin: yes Recent stressors: no Recurrent  headaches: no Visual changes: no Palpitations: no Dyspnea: no Chest pain: no Lower extremity edema: no Dizzy/lightheaded:  with standing on occasion    Relevant past medical, surgical, family and social history reviewed and updated as indicated. Interim medical history since our last visit reviewed. Allergies and medications reviewed and updated.  Review of Systems  Constitutional:  Negative for activity change, appetite change, diaphoresis, fatigue and fever.  Respiratory:  Negative for cough, chest tightness and shortness of breath.   Cardiovascular:  Negative for chest pain, palpitations and leg swelling.  Gastrointestinal: Negative.   Neurological: Negative.   Psychiatric/Behavioral: Negative.      Per HPI unless specifically indicated above     Objective:    BP 123/77   Pulse 71   Temp 97.7 F (36.5 C) (Oral)   Ht 5' 3.5" (1.613 m)   Wt 228 lb (103.4 kg)   SpO2 98%   BMI 39.75 kg/m   Wt Readings from Last 3 Encounters:  02/13/22 228 lb (103.4 kg)  11/13/21 228 lb (103.4 kg)  08/15/21 226 lb 12.8 oz (102.9 kg)    Physical Exam Vitals and nursing note reviewed.  Constitutional:      General: She is awake. She is not in acute distress.    Appearance: She is well-developed and well-groomed. She is morbidly obese. She is not ill-appearing.  HENT:     Head: Normocephalic.     Right Ear: Hearing, ear canal and external ear normal.     Left Ear: Hearing, ear canal  and external ear normal.     Mouth/Throat:     Mouth: Mucous membranes are moist.  Eyes:     General: Lids are normal.        Right eye: No discharge.        Left eye: No discharge.     Conjunctiva/sclera: Conjunctivae normal.     Pupils: Pupils are equal, round, and reactive to light.  Neck:     Thyroid: No thyromegaly.     Vascular: No carotid bruit.  Cardiovascular:     Rate and Rhythm: Normal rate and regular rhythm.     Heart sounds: Normal heart sounds. No murmur heard.    No gallop.   Pulmonary:     Effort: Pulmonary effort is normal. No accessory muscle usage or respiratory distress.     Breath sounds: Normal breath sounds.  Abdominal:     General: Bowel sounds are normal.     Palpations: Abdomen is soft.  Musculoskeletal:     Cervical back: Normal range of motion and neck supple.     Right lower leg: No edema.     Left lower leg: No edema.  Skin:    General: Skin is warm and dry.  Neurological:     Mental Status: She is alert and oriented to person, place, and time.  Psychiatric:        Attention and Perception: Attention normal.        Mood and Affect: Mood normal.        Speech: Speech normal.        Behavior: Behavior normal. Behavior is cooperative.        Thought Content: Thought content normal.    Results for orders placed or performed in visit on 11/13/21  Bayer DCA Hb A1c Waived  Result Value Ref Range   HB A1C (BAYER DCA - WAIVED) 5.9 (H) 4.8 - 5.6 %  Microalbumin, Urine Waived  Result Value Ref Range   Microalb, Ur Waived 10 0 - 19 mg/L   Creatinine, Urine Waived 200 10 - 300 mg/dL   Microalb/Creat Ratio <30 <30 mg/g  CBC with Differential/Platelet  Result Value Ref Range   WBC 5.7 3.4 - 10.8 x10E3/uL   RBC 4.54 3.77 - 5.28 x10E6/uL   Hemoglobin 13.1 11.1 - 15.9 g/dL   Hematocrit 39.9 34.0 - 46.6 %   MCV 88 79 - 97 fL   MCH 28.9 26.6 - 33.0 pg   MCHC 32.8 31.5 - 35.7 g/dL   RDW 13.9 11.7 - 15.4 %   Platelets 178 150 - 450 x10E3/uL   Neutrophils 59 Not Estab. %   Lymphs 27 Not Estab. %   Monocytes 8 Not Estab. %   Eos 4 Not Estab. %   Basos 1 Not Estab. %   Neutrophils Absolute 3.4 1.4 - 7.0 x10E3/uL   Lymphocytes Absolute 1.6 0.7 - 3.1 x10E3/uL   Monocytes Absolute 0.5 0.1 - 0.9 x10E3/uL   EOS (ABSOLUTE) 0.3 0.0 - 0.4 x10E3/uL   Basophils Absolute 0.1 0.0 - 0.2 x10E3/uL   Immature Granulocytes 1 Not Estab. %   Immature Grans (Abs) 0.0 0.0 - 0.1 x10E3/uL  Comprehensive metabolic panel  Result Value Ref Range   Glucose 108 (H) 70  - 99 mg/dL   BUN 29 (H) 8 - 27 mg/dL   Creatinine, Ser 1.13 (H) 0.57 - 1.00 mg/dL   eGFR 52 (L) >59 mL/min/1.73   BUN/Creatinine Ratio 26 12 - 28   Sodium 142 134 -  144 mmol/L   Potassium 4.6 3.5 - 5.2 mmol/L   Chloride 106 96 - 106 mmol/L   CO2 21 20 - 29 mmol/L   Calcium 9.4 8.7 - 10.3 mg/dL   Total Protein 6.3 6.0 - 8.5 g/dL   Albumin 4.5 3.7 - 4.7 g/dL   Globulin, Total 1.8 1.5 - 4.5 g/dL   Albumin/Globulin Ratio 2.5 (H) 1.2 - 2.2   Bilirubin Total 0.4 0.0 - 1.2 mg/dL   Alkaline Phosphatase 133 (H) 44 - 121 IU/L   AST 20 0 - 40 IU/L   ALT 23 0 - 32 IU/L  Lipid Panel w/o Chol/HDL Ratio  Result Value Ref Range   Cholesterol, Total 131 100 - 199 mg/dL   Triglycerides 156 (H) 0 - 149 mg/dL   HDL 44 >39 mg/dL   VLDL Cholesterol Cal 27 5 - 40 mg/dL   LDL Chol Calc (NIH) 60 0 - 99 mg/dL  TSH  Result Value Ref Range   TSH 4.000 0.450 - 4.500 uIU/mL  T4, free  Result Value Ref Range   Free T4 1.12 0.82 - 1.77 ng/dL  Magnesium  Result Value Ref Range   Magnesium 1.8 1.6 - 2.3 mg/dL      Assessment & Plan:   Problem List Items Addressed This Visit       Cardiovascular and Mediastinum   AV BLOCK, 1ST DEGREE    Followed by cardiology, continue collaboration and review notes.      Hypertension associated with diabetes ([place])    Chronic, ongoing with BP at goal in office and at home.  Continue current medication regimen and adjust as needed + continue collaboration with cardiology.  LABS up to date.  Recommend she continue to monitor BP at home and focus on DASH diet.  Return in 3 months.      Relevant Medications   empagliflozin (JARDIANCE) 25 MG TABS tablet   Other Relevant Orders   Bayer DCA Hb A1c Waived     Endocrine   Diabetes mellitus associated with hormonal etiology ([place]) - Primary    Chronic, ongoing with A1C today 6.1%. Urine micro ALB 10 and A:C <30 in March 2023. Continue current medication regimen as A1c at goal and no hypoglycemia at home. If continued  stable A1c trend may consider reduction of medication.  Continue to monitor BS at home daily.   Return in 3 months for visit.      Relevant Medications   empagliflozin (JARDIANCE) 25 MG TABS tablet   Other Relevant Orders   Bayer DCA Hb A1c Waived   Hyperlipidemia associated with type 2 diabetes mellitus (HCC)    Chronic, ongoing.  Continue current medication regimen and adjust as needed.  Lipid panel up to date.      Relevant Medications   empagliflozin (JARDIANCE) 25 MG TABS tablet   Other Relevant Orders   Bayer DCA Hb A1c Waived     Nervous and Auditory   Chemotherapy-induced neuropathy (HCC)    Chronic, ongoing.  Continue Lyrica and adjust as needed, especially with aging monitor dose closely.  Refills up to date.  Return in 3 months.        Other   History of breast cancer    Continue collaboration with oncology as needed.  Annual mammograms.      Hypomagnesemia    Ongoing and stable.  Last level was 1.8, normal. Continue amiloride and follow up with nephrology. Magnesium rechecked today      Relevant Orders  Magnesium   Morbid obesity (HCC)    BMI 39.75 with T2DM and HTN.  Recommended eating smaller high protein, low fat meals more frequently and exercising 30 mins a day 5 times a week with a goal of 10-15 lb weight loss in the next 3 months. Patient voiced their understanding and motivation to adhere to these recommendations.       Relevant Medications   empagliflozin (JARDIANCE) 25 MG TABS tablet   PPM-[employer]    Continue collaboration with cardiology for checks.      S/P carpal tunnel release    Continue collaboration with orthopedics, recent surgery.        Follow up plan: Return in about 3 months (around 05/16/2022) for T2DM, HTN/HLD, B12, NEUROPATHY.

## 2022-02-13 NOTE — Assessment & Plan Note (Signed)
Ongoing and stable.  Last level was 1.8, normal. Continue amiloride and follow up with nephrology. Magnesium rechecked today

## 2022-02-13 NOTE — Assessment & Plan Note (Signed)
Chronic, ongoing.  Continue current medication regimen and adjust as needed.  Lipid panel up to date. 

## 2022-02-13 NOTE — Assessment & Plan Note (Signed)
Continue collaboration with oncology as needed.  Annual mammograms.

## 2022-02-13 NOTE — Assessment & Plan Note (Signed)
Chronic, ongoing.  Continue Lyrica and adjust as needed, especially with aging monitor dose closely.  Refills up to date.  Return in 3 months. 

## 2022-02-13 NOTE — Assessment & Plan Note (Signed)
Continue collaboration with cardiology for checks. 

## 2022-02-13 NOTE — Assessment & Plan Note (Signed)
BMI 39.75 with T2DM and HTN.  Recommended eating smaller high protein, low fat meals more frequently and exercising 30 mins a day 5 times a week with a goal of 10-15 lb weight loss in the next 3 months. Patient voiced their understanding and motivation to adhere to these recommendations.

## 2022-02-13 NOTE — Assessment & Plan Note (Signed)
Followed by cardiology, continue collaboration and review notes.

## 2022-02-13 NOTE — Assessment & Plan Note (Signed)
Chronic, ongoing with BP at goal in office and at home.  Continue current medication regimen and adjust as needed + continue collaboration with cardiology.  LABS up to date.  Recommend she continue to monitor BP at home and focus on DASH diet.  Return in 3 months.

## 2022-02-14 LAB — MAGNESIUM: Magnesium: 1.6 mg/dL (ref 1.6–2.3)

## 2022-02-19 ENCOUNTER — Encounter: Payer: Self-pay | Admitting: Internal Medicine

## 2022-02-19 ENCOUNTER — Ambulatory Visit (INDEPENDENT_AMBULATORY_CARE_PROVIDER_SITE_OTHER): Payer: Medicare PPO | Admitting: Internal Medicine

## 2022-02-19 VITALS — BP 120/58 | HR 75 | Ht 63.5 in | Wt 229.2 lb

## 2022-02-19 DIAGNOSIS — Z950 Presence of cardiac pacemaker: Secondary | ICD-10-CM | POA: Diagnosis not present

## 2022-02-19 DIAGNOSIS — I440 Atrioventricular block, first degree: Secondary | ICD-10-CM

## 2022-02-19 DIAGNOSIS — I5032 Chronic diastolic (congestive) heart failure: Secondary | ICD-10-CM

## 2022-02-19 LAB — CUP PACEART INCLINIC DEVICE CHECK
Brady Statistic RA Percent Paced: 1 % — CL
Brady Statistic RV Percent Paced: 6 %
Date Time Interrogation Session: 20230627164409
Implantable Lead Implant Date: 20040917
Implantable Lead Implant Date: 20040917
Implantable Lead Location: 753859
Implantable Lead Location: 753860
Implantable Lead Model: 4479
Implantable Lead Model: 5076
Implantable Lead Serial Number: 312732
Implantable Pulse Generator Implant Date: 20111209
Lead Channel Impedance Value: 460 Ohm
Lead Channel Impedance Value: 470 Ohm
Lead Channel Pacing Threshold Amplitude: 0.5 V
Lead Channel Pacing Threshold Amplitude: 0.9 V
Lead Channel Pacing Threshold Pulse Width: 0.4 ms
Lead Channel Pacing Threshold Pulse Width: 0.4 ms
Lead Channel Sensing Intrinsic Amplitude: 3 mV
Lead Channel Sensing Intrinsic Amplitude: 9.8 mV
Lead Channel Setting Pacing Amplitude: 2 V
Lead Channel Setting Pacing Amplitude: 2.4 V
Lead Channel Setting Pacing Pulse Width: 0.4 ms
Lead Channel Setting Sensing Sensitivity: 2.5 mV
Pulse Gen Serial Number: 595359

## 2022-02-19 LAB — PACEMAKER DEVICE OBSERVATION

## 2022-02-28 ENCOUNTER — Telehealth: Payer: Medicare PPO | Admitting: Nurse Practitioner

## 2022-02-28 ENCOUNTER — Ambulatory Visit: Payer: Self-pay | Admitting: *Deleted

## 2022-02-28 DIAGNOSIS — U071 COVID-19: Secondary | ICD-10-CM | POA: Diagnosis not present

## 2022-02-28 MED ORDER — ALBUTEROL SULFATE HFA 108 (90 BASE) MCG/ACT IN AERS: 2.0000 | INHALATION_SPRAY | Freq: Four times a day (QID) | RESPIRATORY_TRACT | 0 refills | Status: DC | PRN

## 2022-02-28 MED ORDER — MOLNUPIRAVIR EUA 200MG CAPSULE: 4.0000 | ORAL_CAPSULE | Freq: Two times a day (BID) | ORAL | 0 refills | Status: AC

## 2022-02-28 NOTE — Telephone Encounter (Signed)
  Chief Complaint: + COVID Symptoms: cough, chest congestion Frequency: symptoms started late Tuesday Pertinent Negatives: Patient denies fever, SOB Disposition: '[]'$ ED /'[]'$ Urgent Care (no appt availability in office) / '[]'$ Appointment(In office/virtual)/ '[x]'$  [facility]/ '[]'$ Home Care/ '[]'$ Refused Recommended Disposition /'[]'$  Mobile Bus/ '[]'$  Follow-up with PCP Additional Notes: Patient wanted to be seen ASAP- so she could start treatment- she has plans later on this month- Virtual visit with UC provider scheduled  Reason for Disposition  [1] HIGH RISK for severe COVID complications (e.g., weak immune system, age > 53 years, obesity with BMI > 25, pregnant, chronic lung disease or other chronic medical condition) AND [2] COVID symptoms (e.g., cough, fever)  (Exceptions: Already seen by PCP and no new or worsening symptoms.)  Answer Assessment - Initial Assessment Questions 1. COVID-19 DIAGNOSIS: "Who made your COVID-19 diagnosis?" "Was it confirmed by a positive lab test or self-test?" If not diagnosed by a doctor (or NP/PA), ask "Are there lots of cases (community spread) where you live?" Note: See public health department website, if unsure.     + home test today 2. COVID-19 EXPOSURE: "Was there any known exposure to COVID before the symptoms began?" CDC Definition of close contact: within 6 feet (2 meters) for a total of 15 minutes or more over a 24-hour period.      Unknown exposure 3. ONSET: "When did the COVID-19 symptoms start?"      Tuesday night 4. WORST SYMPTOM: "What is your worst symptom?" (e.g., cough, fever, shortness of breath, muscle aches)     Cough/chest congestion 5. COUGH: "Do you have a cough?" If Yes, ask: "How bad is the cough?"       Yes- coughing greenish 6. FEVER: "Do you have a fever?" If Yes, ask: "What is your temperature, how was it measured, and when did it start?"     no 7. RESPIRATORY STATUS: "Describe your breathing?" (e.g., shortness of breath,  wheezing, unable to speak)      normal 8. BETTER-SAME-WORSE: "Are you getting better, staying the same or getting worse compared to yesterday?"  If getting worse, ask, "In what way?"     Worse- developing more symptoms 9. HIGH RISK DISEASE: "Do you have any chronic medical problems?" (e.g., asthma, heart or lung disease, weak immune system, obesity, etc.)     Age, hx cancer, heart disease,  10. VACCINE: "Have you had the COVID-19 vaccine?" If Yes, ask: "Which one, how many shots, when did you get it?"       Yes-all 11. BOOSTER: "Have you received your COVID-19 booster?" If Yes, ask: "Which one and when did you get it?"       yes 12. PREGNANCY: "Is there any chance you are pregnant?" "When was your last menstrual period?"       N/a 13. OTHER SYMPTOMS: "Do you have any other symptoms?"  (e.g., chills, fatigue, headache, loss of smell or taste, muscle pain, sore throat)       Body aches 14. O2 SATURATION MONITOR:  "Do you use an oxygen saturation monitor (pulse oximeter) at home?" If Yes, ask "What is your reading (oxygen level) today?" "What is your usual oxygen saturation reading?" (e.g., 95%)  Protocols used: Coronavirus (COVID-19) Diagnosed or Suspected-A-AH

## 2022-02-28 NOTE — Progress Notes (Signed)
Virtual Visit Consent   [name], you are scheduled for a virtual visit with a [facility] provider today. Just as with appointments in the office, your consent must be obtained to participate. Your consent will be active for this visit and any virtual visit you may have with one of our providers in the next 365 days. If you have a MyChart account, a copy of this consent can be sent to you electronically.  As this is a virtual visit, video technology does not allow for your provider to perform a traditional examination. This may limit your provider's ability to fully assess your condition. If your provider identifies any concerns that need to be evaluated in person or the need to arrange testing (such as labs, EKG, etc.), we will make arrangements to do so. Although advances in technology are sophisticated, we cannot ensure that it will always work on either your end or our end. If the connection with a video visit is poor, the visit may have to be switched to a telephone visit. With either a video or telephone visit, we are not always able to ensure that we have a secure connection.  By engaging in this virtual visit, you consent to the provision of healthcare and authorize for your insurance to be billed (if applicable) for the services provided during this visit. Depending on your insurance coverage, you may receive a charge related to this service.  I need to obtain your verbal consent now. Are you willing to proceed with your visit today? [name] has provided verbal consent on 02/28/2022 for a virtual visit (video or telephone). Apolonio Schneiders, FNP  Date: 02/28/2022 3:57 PM  Virtual Visit via Video Note   I, Apolonio Schneiders, connected with  [name]  ([id number], 12/21/1950) on 02/28/22 at  4:00 PM EDT by a video-enabled telemedicine application and verified that I am speaking with the correct person using two identifiers.  Location: Patient: Virtual Visit Location Patient: Home Provider:  Virtual Visit Location Provider: Home Office   I discussed the limitations of evaluation and management by telemedicine and the availability of in person appointments. The patient expressed understanding and agreed to proceed.    History of Present Illness: [name] is a 71 y.o. who identifies as a female who was assigned female at birth, and is being seen today after testing positive for COVID today. Today is her third day of symptoms.   Her test yesterday was negative and today was positive.  Tested at home She does have some friends with COVID currently   She has been vaccinated for COVID  Has not had COVID infection in the past   Symptoms are nasal congestion, cough and fullness in her ears.   Has used an inhaler in the past for URIs in the past had RSV in the fall and required an inhaler at that time. Has had bronchitis in the past   Problems:  Patient Active Problem List   Diagnosis Date Noted   S/P carpal tunnel release 11/13/2021   Vitamin D deficiency 08/12/2021   Chronic left-sided low back pain with left-sided sciatica 05/15/2021   S/P right oophorectomy 01/18/2021   Elevated TSH 12/14/2020   History of kidney stones 11/07/2020   Seasonal allergic rhinitis 08/11/2020   Morbid obesity ([place]) 04/27/2020   B12 deficiency 09/28/2019   Advanced care planning/counseling discussion 09/09/2017   Hyperlipidemia associated with type 2 diabetes mellitus ([place]) 07/29/2016   Chemotherapy-induced neuropathy ([place]) 04/01/2016  History of breast cancer 10/02/2015   Hypertension associated with diabetes ([place]) 06/19/2015   Diabetes mellitus associated with hormonal etiology ([place]) 06/19/2015   Hypomagnesemia 06/19/2015   AV BLOCK, 1ST DEGREE 07/02/2010   PPM-[employer] 01/13/2009    Allergies:  Allergies  Allergen Reactions   Penicillins Swelling    Causes organs to swell Other reaction(s): Other (See Comments) Organs flipped over per patient; skin starting splitting  open and blood vessels burst in legs;  Causes organs to swell   Levofloxacin Swelling    Leg swelling and muscle weakness Other reaction(s): Other (See Comments) Tendon pain and weakness in muscles Leg swelling and muscle weakness   Other Itching    bandaids   Medications:  Current Outpatient Medications:    aMILoride (MIDAMOR) 5 MG tablet, Take 1 tablet (5 mg total) by mouth daily., Disp: 90 tablet, Rfl: 4   aspirin-acetaminophen-caffeine (EXCEDRIN MIGRAINE) 250-250-65 MG tablet, Take 2 tablets by mouth every 6 (six) hours as needed for headache or migraine., Disp: , Rfl:    atenolol (TENORMIN) 50 MG tablet, Take 1 tablet (50 mg total) by mouth daily., Disp: 90 tablet, Rfl: 4   atorvastatin (LIPITOR) 40 MG tablet, Take 1 tablet (40 mg total) by mouth daily., Disp: 90 tablet, Rfl: 4   azelastine (ASTELIN) 0.1 % nasal spray, Place 1 spray into both nostrils 2 (two) times daily. Use in each nostril as directed, Disp: , Rfl:    Cholecalciferol (VITAMIN D3) 50 MCG (2000 UT) TABS, Take 1 tablet by mouth daily., Disp: , Rfl:    CINNAMON PO, Take 3 capsules by mouth in the morning., Disp: , Rfl:    CRANBERRY-VITAMIN C PO, Take 1 tablet by mouth daily., Disp: , Rfl:    Cyanocobalamin (VITAMIN B-12 PO), Take 1 tablet by mouth every other day., Disp: , Rfl:    empagliflozin (JARDIANCE) 25 MG TABS tablet, Take 1 tablet (25 mg total) by mouth daily before breakfast., Disp: 90 tablet, Rfl: 4   esomeprazole (NEXIUM) 20 MG capsule, Take 20 mg by mouth every morning., Disp: , Rfl:    fluticasone (FLONASE) 50 MCG/ACT nasal spray, Place 2 sprays into both nostrils daily., Disp: 16 g, Rfl: 12   glucose blood (ACCU-CHEK AVIVA PLUS) test strip, TEST BLOOD SUGAR ONCE DAILY, Disp: 100 each, Rfl: 12   levocetirizine (XYZAL) 5 MG tablet, Take 5 mg by mouth every evening., Disp: , Rfl:    MAGNESIUM CHLORIDE-CALCIUM PO, Take 3 tablets by mouth daily., Disp: , Rfl:    magnesium oxide (MAG-OX) 400 MG tablet, Take  1,200 mg by mouth daily., Disp: , Rfl:    melatonin 3 MG TABS tablet, Take 3 mg by mouth at bedtime., Disp: , Rfl:    meloxicam (MOBIC) 15 MG tablet, Take 15 mg by mouth daily., Disp: , Rfl:    montelukast (SINGULAIR) 10 MG tablet, Take 1 tablet (10 mg total) by mouth at bedtime., Disp: 90 tablet, Rfl: 4   nystatin (MYCOSTATIN/NYSTOP) powder, Apply 1 Application topically 2 (two) times daily., Disp: 15 g, Rfl: 4   oxyCODONE (OXY IR/ROXICODONE) 5 MG immediate release tablet, oxycodone 5 mg tablet  Take 1 tablet every 4 hours by oral route. (Patient not taking: Reported on 02/19/2022), Disp: , Rfl:    pregabalin (LYRICA) 50 MG capsule, Take 1 capsule (50 mg total) by mouth 3 (three) times daily., Disp: 270 capsule, Rfl: 4   SUMAtriptan (IMITREX) 100 MG tablet, Take 1 tablet (100 mg total) by mouth as needed., Disp:  10 tablet, Rfl: 12  Observations/Objective: Patient is well-developed, well-nourished in no acute distress.  Resting comfortably at home.  Head is normocephalic, atraumatic.  No labored breathing.  Speech is clear and coherent with logical content.  Patient is alert and oriented at baseline.    Assessment and Plan: 1. COVID-19  - molnupiravir EUA (LAGEVRIO) 200 mg CAPS capsule; Take 4 capsules (800 mg total) by mouth 2 (two) times daily for 5 days.  Dispense: 40 capsule; Refill: 0    Continue to manage symptoms with over the counter medications as discussed    Follow Up Instructions: I discussed the assessment and treatment plan with the patient. The patient was provided an opportunity to ask questions and all were answered. The patient agreed with the plan and demonstrated an understanding of the instructions.  A copy of instructions were sent to the patient via MyChart unless otherwise noted below.    The patient was advised to call back or seek an in-person evaluation if the symptoms worsen or if the condition fails to improve as anticipated.  Time:  I spent 15 minutes with  the patient via telehealth technology discussing the above problems/concerns.    Apolonio Schneiders, FNP

## 2022-03-08 ENCOUNTER — Encounter: Payer: Self-pay | Admitting: Internal Medicine

## 2022-05-12 NOTE — Patient Instructions (Signed)

## 2022-05-13 ENCOUNTER — Other Ambulatory Visit: Payer: Self-pay | Admitting: Nurse Practitioner

## 2022-05-13 DIAGNOSIS — T451X5A Adverse effect of antineoplastic and immunosuppressive drugs, initial encounter: Secondary | ICD-10-CM

## 2022-05-14 ENCOUNTER — Other Ambulatory Visit: Payer: Self-pay | Admitting: Nurse Practitioner

## 2022-05-14 DIAGNOSIS — G620 Drug-induced polyneuropathy: Secondary | ICD-10-CM

## 2022-05-14 NOTE — Telephone Encounter (Signed)
Requested medication (s) are due for refill today: yes  Requested medication (s) are on the active medication list: yes  Last refill:  11/13/21 #270 4 refills  Future visit scheduled: yes in 2 days  Notes to clinic:  not delegated per protocol . Do you want to refill Rx?     Requested Prescriptions  Pending Prescriptions Disp Refills   pregabalin (LYRICA) 50 MG capsule [Pharmacy Med Name: PREGABALIN 50 MG CAPSULE] 270 capsule 0    Sig: Take 1 capsule (50 mg total) by mouth 3 (three) times daily.     Not Delegated - Neurology:  Anticonvulsants - Controlled - pregabalin Failed - 05/13/2022  1:29 PM      Failed - This refill cannot be delegated      Failed - Cr in normal range and within 360 days    Creatinine, Ser  Date Value Ref Range Status  11/13/2021 1.13 (H) 0.57 - 1.00 mg/dL Final         Passed - Completed PHQ-2 or PHQ-9 in the last 360 days      Passed - Valid encounter within last 12 months    Recent Outpatient Visits           3 months ago Diabetes mellitus associated with hormonal etiology ([place])   [place] [name], [name], NP   6 months ago Diabetes mellitus associated with hormonal etiology ([place])   [place], [name], NP   9 months ago Diabetes mellitus associated with hormonal etiology ([place])   [place] [name], [name], NP   10 months ago Acute cough   [place], [place] T, NP   10 months ago Chronic left-sided low back pain with left-sided sciatica   [place], [name], NP       Future Appointments             In 2 days [name], [name], NP [facility], [name]   In 35 month  [name]

## 2022-05-15 ENCOUNTER — Other Ambulatory Visit: Payer: Self-pay

## 2022-05-15 DIAGNOSIS — G620 Drug-induced polyneuropathy: Secondary | ICD-10-CM

## 2022-05-15 NOTE — Telephone Encounter (Signed)
Requested medication (s) are due for refill today - no  Requested medication (s) are on the active medication list -yes  Future visit scheduled - yes  Last refill: 11/13/21 #270 4RF  Notes to clinic: duplicate request- non delegated Rx  Requested Prescriptions  Pending Prescriptions Disp Refills   pregabalin (LYRICA) 50 MG capsule [Pharmacy Med Name: PREGABALIN 50 MG CAPSULE] 270 capsule 0    Sig: Take 1 capsule (50 mg total) by mouth 3 (three) times daily.     Not Delegated - Neurology:  Anticonvulsants - Controlled - pregabalin Failed - 05/14/2022  1:31 PM      Failed - This refill cannot be delegated      Failed - Cr in normal range and within 360 days    Creatinine, Ser  Date Value Ref Range Status  11/13/2021 1.13 (H) 0.57 - 1.00 mg/dL Final         Passed - Completed PHQ-2 or PHQ-9 in the last 360 days      Passed - Valid encounter within last 12 months    Recent Outpatient Visits           3 months ago Diabetes mellitus associated with hormonal etiology ([place])   [place], [name], NP   6 months ago Diabetes mellitus associated with hormonal etiology ([place])   [place], [name], NP   9 months ago Diabetes mellitus associated with hormonal etiology ([place])   [place] [name], [name], NP   10 months ago Acute cough   [place], [place] T, NP   10 months ago Chronic left-sided low back pain with left-sided sciatica   [place], [name], NP       Future Appointments             Tomorrow Venita Lick, NP [name]   In 1 month  [name]               Requested Prescriptions  Pending Prescriptions Disp Refills   pregabalin (LYRICA) 50 MG capsule [Pharmacy Med Name: PREGABALIN 50 MG CAPSULE] 270 capsule 0    Sig: Take 1 capsule (50 mg total) by mouth 3 (three) times daily.     Not Delegated - Neurology:   Anticonvulsants - Controlled - pregabalin Failed - 05/14/2022  1:31 PM      Failed - This refill cannot be delegated      Failed - Cr in normal range and within 360 days    Creatinine, Ser  Date Value Ref Range Status  11/13/2021 1.13 (H) 0.57 - 1.00 mg/dL Final         Passed - Completed PHQ-2 or PHQ-9 in the last 360 days      Passed - Valid encounter within last 12 months    Recent Outpatient Visits           3 months ago Diabetes mellitus associated with hormonal etiology ([place])   [place] [name], [name], NP   6 months ago Diabetes mellitus associated with hormonal etiology ([place])   [place], [name], NP   9 months ago Diabetes mellitus associated with hormonal etiology ([place])   [place] [name], [name], NP   10 months ago Acute cough   [place], [place] T, NP   10 months ago Chronic left-sided low back pain with left-sided sciatica   [facility] Venita Lick, NP       Future Appointments             Tomorrow Venita Lick, NP [name]   In 1 month  [name]

## 2022-05-16 ENCOUNTER — Ambulatory Visit: Payer: Medicare PPO | Admitting: Nurse Practitioner

## 2022-05-16 ENCOUNTER — Encounter: Payer: Self-pay | Admitting: Nurse Practitioner

## 2022-05-16 VITALS — BP 126/73 | HR 64 | Temp 97.9°F | Wt 227.8 lb

## 2022-05-16 DIAGNOSIS — Z23 Encounter for immunization: Secondary | ICD-10-CM

## 2022-05-16 DIAGNOSIS — G620 Drug-induced polyneuropathy: Secondary | ICD-10-CM | POA: Diagnosis not present

## 2022-05-16 DIAGNOSIS — Z950 Presence of cardiac pacemaker: Secondary | ICD-10-CM

## 2022-05-16 DIAGNOSIS — I440 Atrioventricular block, first degree: Secondary | ICD-10-CM

## 2022-05-16 DIAGNOSIS — E538 Deficiency of other specified B group vitamins: Secondary | ICD-10-CM

## 2022-05-16 DIAGNOSIS — E785 Hyperlipidemia, unspecified: Secondary | ICD-10-CM

## 2022-05-16 DIAGNOSIS — E1169 Type 2 diabetes mellitus with other specified complication: Secondary | ICD-10-CM

## 2022-05-16 DIAGNOSIS — E559 Vitamin D deficiency, unspecified: Secondary | ICD-10-CM

## 2022-05-16 DIAGNOSIS — I152 Hypertension secondary to endocrine disorders: Secondary | ICD-10-CM

## 2022-05-16 DIAGNOSIS — T451X5A Adverse effect of antineoplastic and immunosuppressive drugs, initial encounter: Secondary | ICD-10-CM

## 2022-05-16 DIAGNOSIS — E1159 Type 2 diabetes mellitus with other circulatory complications: Secondary | ICD-10-CM

## 2022-05-16 LAB — BAYER DCA HB A1C WAIVED: HB A1C (BAYER DCA - WAIVED): 6 % — ABNORMAL HIGH (ref 4.8–5.6)

## 2022-05-16 NOTE — Assessment & Plan Note (Signed)
Chronic, stable.  BP at goal at home and in office.  Continue current medication regimen and adjust as needed + continue collaboration with cardiology.  LABS: BMP.  Recommend she continue to monitor BP at home and focus on DASH diet.  Return in 3 months.

## 2022-05-16 NOTE — Progress Notes (Signed)
BP 126/73 (BP Location: Right Arm, Cuff Size: Large)   Pulse 64   Temp 97.9 F (36.6 C) (Oral)   Wt 227 lb 12.8 oz (103.3 kg)   SpO2 97%   BMI 39.72 kg/m    Subjective:    Patient ID: [name], female    DOB: Mar 23, 1951, 71 y.o.   MRN: [id number]  HPI: [name] is a 71 y.o. female  Chief Complaint  Patient presents with   Diabetes    Eye exam requested from [facility]   Hypertension   DIABETES A1c last check 6.1%.  Continues on Jardiance 25 MG daily.  History of breast cancer, takes Lyrica TID for chemotherapy-induced neuropathy.  Was followed by oncology and now to return as needed only, history of low magnesium, but recent levels have been stable -- taking Magnesium and Amiloride. Hypoglycemic episodes:no Polydipsia/polyuria: no Visual disturbance: no Chest pain: no Paresthesias: no Glucose Monitoring: yes  Accucheck frequency: Daily  Fasting glucose: 111 this morning, often in this range  Post prandial:  Evening:  Before meals: Taking Insulin?: no  Long acting insulin:  Short acting insulin: Blood Pressure Monitoring: a few times a month Retinal Examination: Up to Date -- in July [facility] Foot Exam: Up to Date Pneumovax: Up to Date Influenza: Up to Date Aspirin: yes   HYPERTENSION / HYPERLIPIDEMIA Continues on Atenolol and Atorvastatin.  Followed by cardiology with last visit 02/19/22.  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  She has been working on weight loss got to 219 lbs home.    Continues on supplements for low B12 and Vitamin D. Satisfied with current treatment? yes Duration of hypertension: chronic BP monitoring frequency: a few times a month BP range: 120/70 range on average BP medication side effects: no Duration of hyperlipidemia: chronic Cholesterol medication side effects: no Cholesterol supplements: none Medication compliance: good compliance Aspirin: yes Recent stressors: no Recurrent headaches:  no Visual changes: no Palpitations: no Dyspnea: no Chest pain: no Lower extremity edema: no Dizzy/lightheaded: no  Relevant past medical, surgical, family and social history reviewed and updated as indicated. Interim medical history since our last visit reviewed. Allergies and medications reviewed and updated.  Review of Systems  Constitutional:  Negative for activity change, appetite change, diaphoresis, fatigue and fever.  Respiratory:  Negative for cough, chest tightness and shortness of breath.   Cardiovascular:  Negative for chest pain, palpitations and leg swelling.  Gastrointestinal: Negative.   Neurological: Negative.   Psychiatric/Behavioral: Negative.     Per HPI unless specifically indicated above     Objective:    BP 126/73 (BP Location: Right Arm, Cuff Size: Large)   Pulse 64   Temp 97.9 F (36.6 C) (Oral)   Wt 227 lb 12.8 oz (103.3 kg)   SpO2 97%   BMI 39.72 kg/m   Wt Readings from Last 3 Encounters:  05/16/22 227 lb 12.8 oz (103.3 kg)  02/19/22 229 lb 3.2 oz (104 kg)  02/13/22 228 lb (103.4 kg)    Physical Exam Vitals and nursing note reviewed.  Constitutional:      General: She is awake. She is not in acute distress.    Appearance: She is well-developed and well-groomed. She is morbidly obese. She is not ill-appearing.  HENT:     Head: Normocephalic.     Right Ear: Hearing, ear canal and external ear normal.     Left Ear: Hearing, ear canal and external ear normal.  Mouth/Throat:     Mouth: Mucous membranes are moist.  Eyes:     General: Lids are normal.        Right eye: No discharge.        Left eye: No discharge.     Conjunctiva/sclera: Conjunctivae normal.     Pupils: Pupils are equal, round, and reactive to light.  Neck:     Thyroid: No thyromegaly.     Vascular: No carotid bruit.  Cardiovascular:     Rate and Rhythm: Normal rate and regular rhythm.     Heart sounds: Normal heart sounds. No murmur heard.    No gallop.  Pulmonary:      Effort: Pulmonary effort is normal. No accessory muscle usage or respiratory distress.     Breath sounds: Normal breath sounds.  Abdominal:     General: Bowel sounds are normal.     Palpations: Abdomen is soft.  Musculoskeletal:     Cervical back: Normal range of motion and neck supple.     Right lower leg: No edema.     Left lower leg: No edema.  Skin:    General: Skin is warm and dry.  Neurological:     Mental Status: She is alert and oriented to person, place, and time.  Psychiatric:        Attention and Perception: Attention normal.        Mood and Affect: Mood normal.        Speech: Speech normal.        Behavior: Behavior normal. Behavior is cooperative.        Thought Content: Thought content normal.    Diabetic Foot Exam - Simple   Simple Foot Form Visual Inspection No deformities, no ulcerations, no other skin breakdown bilaterally: Yes Sensation Testing Intact to touch and monofilament testing bilaterally: Yes Pulse Check Posterior Tibialis and Dorsalis pulse intact bilaterally: Yes Comments     Results for orders placed or performed in visit on 03/08/22  Pacemaker Device Observation  Result Value Ref Range   PACEART PDF     DEVICE MODEL PM     BATTERY VOLTAGE        Assessment & Plan:   Problem List Items Addressed This Visit       Cardiovascular and Mediastinum   AV BLOCK, 1ST DEGREE    Followed by cardiology, continue collaboration.  Recent note reviewed.      Relevant Orders   Basic metabolic panel   Hypertension associated with diabetes (HCC)    Chronic, stable.  BP at goal at home and in office.  Continue current medication regimen and adjust as needed + continue collaboration with cardiology.  LABS: BMP.  Recommend she continue to monitor BP at home and focus on DASH diet.  Return in 3 months.      Relevant Orders   Bayer DCA Hb A1c Waived   Basic metabolic panel     Endocrine   Diabetes mellitus associated with hormonal etiology ([place]) -  Primary    Chronic, ongoing with A1c 6% today. Urine micro ALB 10 and A:C <30 in March 2023. Continue current medication regimen as A1c at goal and no hypoglycemia at home. If continued stable A1c trend may consider reduction of medication.  Continue to monitor BS at home daily.   Return in 3 months for visit.      Relevant Orders   Bayer DCA Hb A1c Waived   Basic metabolic panel   Hyperlipidemia associated with type 2  diabetes mellitus (HCC)    Chronic, ongoing.  Continue current medication regimen and adjust as needed.  Lipid panel today.      Relevant Orders   Bayer DCA Hb A1c Waived   Lipid Panel w/o Chol/HDL Ratio     Nervous and Auditory   Chemotherapy-induced neuropathy (HCC)    Chronic, ongoing.  Continue Lyrica and adjust as needed, especially with aging monitor dose closely.  Refills up to date.  Return in 3 months.      Relevant Orders   Vitamin B12     Other   B12 deficiency    Chronic, ongoing.  Continue supplement at home and recheck labs today.      Relevant Orders   Vitamin B12   Hypomagnesemia    Ongoing and stable.  Recent level normal. Continue amiloride and follow up with nephrology. Magnesium rechecked today.      Relevant Orders   Magnesium   Morbid obesity ([place])    BMI 39.72 with T2DM and HTN.  Recommended eating smaller high protein, low fat meals more frequently and exercising 30 mins a day 5 times a week with a goal of 10-15 lb weight loss in the next 3 months. Patient voiced their understanding and motivation to adhere to these recommendations.       PPM-[employer]    Continue collaboration with cardiology for checks.      Relevant Orders   Basic metabolic panel   Vitamin D deficiency    Chronic.  Noted past labs, continue supplement and recheck today.      Relevant Orders   VITAMIN D 25 Hydroxy (Vit-D Deficiency, Fractures)   Other Visit Diagnoses     Need for influenza vaccination       Flu shot today.   Relevant Orders    Flu Vaccine QUAD High Dose(Fluad) (Completed)        Follow up plan: Return in about 3 months (around 08/15/2022) for T2DM, HTN/HLD, MAG CHECK.

## 2022-05-16 NOTE — Assessment & Plan Note (Signed)
Chronic, ongoing.  Continue Lyrica and adjust as needed, especially with aging monitor dose closely.  Refills up to date.  Return in 3 months. 

## 2022-05-16 NOTE — Assessment & Plan Note (Signed)
Followed by cardiology, continue collaboration.  Recent note reviewed.

## 2022-05-16 NOTE — Assessment & Plan Note (Signed)
BMI 39.72 with T2DM and HTN.  Recommended eating smaller high protein, low fat meals more frequently and exercising 30 mins a day 5 times a week with a goal of 10-15 lb weight loss in the next 3 months. Patient voiced their understanding and motivation to adhere to these recommendations.

## 2022-05-16 NOTE — Assessment & Plan Note (Signed)
Chronic, ongoing.  Continue current medication regimen and adjust as needed. Lipid panel today. 

## 2022-05-16 NOTE — Assessment & Plan Note (Signed)
Ongoing and stable.  Recent level normal. Continue amiloride and follow up with nephrology. Magnesium rechecked today. 

## 2022-05-16 NOTE — Assessment & Plan Note (Signed)
Chronic, ongoing.  Continue supplement at home and recheck labs today.

## 2022-05-16 NOTE — Assessment & Plan Note (Signed)
Chronic.  Noted past labs, continue supplement and recheck today.

## 2022-05-16 NOTE — Assessment & Plan Note (Signed)
Continue collaboration with cardiology for checks. 

## 2022-05-16 NOTE — Assessment & Plan Note (Signed)
Chronic, ongoing with A1c 6% today. Urine micro ALB 10 and A:C <30 in March 2023. Continue current medication regimen as A1c at goal and no hypoglycemia at home. If continued stable A1c trend may consider reduction of medication.  Continue to monitor BS at home daily.   Return in 3 months for visit.

## 2022-05-17 NOTE — Progress Notes (Signed)
Contacted via MyChart   Good afternoon [place], some of your labs have returned -- just waiting on magnesium and B12 level.  Current: - Kidney function, creatinine and eGFR, remains with mild chronic kidney disease stage 3a, no worsening.  Continue good water intake at home and all current medications. - Cholesterol labs are at goal, continue statin therapy. - Vitamin D level at goal.  Any questions? Keep being amazing!!  Thank you for allowing me to participate in your care.  I appreciate you. Kindest regards, Nyasiah Moffet

## 2022-05-18 LAB — LIPID PANEL W/O CHOL/HDL RATIO
Cholesterol, Total: 122 mg/dL (ref 100–199)
HDL: 45 mg/dL (ref >39)
LDL Chol Calc (NIH): 56 mg/dL (ref 0–99)
Triglycerides: 118 mg/dL (ref 0–149)
VLDL Cholesterol Cal: 21 mg/dL (ref 5–40)

## 2022-05-18 LAB — BASIC METABOLIC PANEL
BUN/Creatinine Ratio: 18 (ref 12–28)
BUN: 20 mg/dL (ref 8–27)
CO2: 22 mmol/L (ref 20–29)
Calcium: 9.5 mg/dL (ref 8.7–10.3)
Chloride: 104 mmol/L (ref 96–106)
Creatinine, Ser: 1.09 mg/dL — ABNORMAL HIGH (ref 0.57–1.00)
Glucose: 94 mg/dL (ref 70–99)
Potassium: 4.9 mmol/L (ref 3.5–5.2)
Sodium: 140 mmol/L (ref 134–144)
eGFR: 54 mL/min/{1.73_m2} — ABNORMAL LOW (ref >59)

## 2022-05-18 LAB — VITAMIN B12: Vitamin B-12: 1106 pg/mL (ref 232–1245)

## 2022-05-18 LAB — VITAMIN D 25 HYDROXY (VIT D DEFICIENCY, FRACTURES): Vit D, 25-Hydroxy: 72 ng/mL (ref 30.0–100.0)

## 2022-05-18 LAB — MAGNESIUM: Magnesium: 1.8 mg/dL (ref 1.6–2.3)

## 2022-05-18 NOTE — Progress Notes (Signed)
Magnesium and B12 levels remain normal!!

## 2022-06-27 ENCOUNTER — Ambulatory Visit: Payer: Medicare PPO

## 2022-08-12 ENCOUNTER — Other Ambulatory Visit: Payer: Self-pay | Admitting: Nurse Practitioner

## 2022-08-12 DIAGNOSIS — G620 Drug-induced polyneuropathy: Secondary | ICD-10-CM

## 2022-08-12 NOTE — Telephone Encounter (Signed)
Requested medication (s) are due for refill today: yes  Requested medication (s) are on the active medication list: yes  Last refill:  05/15/22 #270/0  Future visit scheduled: yes  Notes to clinic:  Unable to refill per protocol, cannot delegate.      Requested Prescriptions  Pending Prescriptions Disp Refills   pregabalin (LYRICA) 50 MG capsule [Pharmacy Med Name: PREGABALIN 50 MG CAPSULE] 270 capsule 0    Sig: Take 1 capsule (50 mg total) by mouth 3 (three) times daily.     Not Delegated - Neurology:  Anticonvulsants - Controlled - pregabalin Failed - 08/12/2022 11:05 AM      Failed - This refill cannot be delegated      Failed - Cr in normal range and within 360 days    Creatinine, Ser  Date Value Ref Range Status  05/16/2022 1.09 (H) 0.57 - 1.00 mg/dL Final         Passed - Completed PHQ-2 or PHQ-9 in the last 360 days      Passed - Valid encounter within last 12 months    Recent Outpatient Visits           2 months ago Diabetes mellitus associated with hormonal etiology ([place])   [place] [name], [name], NP   6 months ago Diabetes mellitus associated with hormonal etiology ([place])   [place], [name], NP   9 months ago Diabetes mellitus associated with hormonal etiology ([place])   [place], [name], NP   12 months ago Diabetes mellitus associated with hormonal etiology ([place])   [place], [name], NP   1 year ago Acute cough   [place], [name], NP       Future Appointments             In 4 days [name], [name], NP [name]

## 2022-08-13 NOTE — Patient Instructions (Signed)
Diabetes Mellitus Basics  Diabetes mellitus, or diabetes, is a long-term (chronic) disease. It occurs when the body does not properly use sugar (glucose) that is released from food after you eat. Diabetes mellitus may be caused by one or both of these problems: Your pancreas does not make enough of a hormone called insulin. Your body does not react in a normal way to the insulin that it makes. Insulin lets glucose enter cells in your body. This gives you energy. If you have diabetes, glucose cannot get into cells. This causes high blood glucose (hyperglycemia). How to treat and manage diabetes You may need to take insulin or other diabetes medicines daily to keep your glucose in balance. If you are prescribed insulin, you will learn how to give yourself insulin by injection. You may need to adjust the amount of insulin you take based on the foods that you eat. You will need to check your blood glucose levels using a glucose monitor as told by your health care provider. The readings can help determine if you have low or high blood glucose. Generally, you should have these blood glucose levels: Before meals (preprandial): 80-130 mg/dL (4.4-7.2 mmol/L). After meals (postprandial): below 180 mg/dL (10 mmol/L). Hemoglobin A1c (HbA1c) level: less than 7%. Your health care provider will set treatment goals for you. Keep all follow-up visits. This is important. Follow these instructions at home: Diabetes medicines Take your diabetes medicines every day as told by your health care provider. List your diabetes medicines here: Name of medicine: ______________________________ Amount (dose): _______________ Time (a.m./p.m.): _______________ Notes: ___________________________________ Name of medicine: ______________________________ Amount (dose): _______________ Time (a.m./p.m.): _______________ Notes: ___________________________________ Name of medicine: ______________________________ Amount (dose):  _______________ Time (a.m./p.m.): _______________ Notes: ___________________________________ Insulin If you use insulin, list the types of insulin you use here: Insulin type: ______________________________ Amount (dose): _______________ Time (a.m./p.m.): _______________Notes: ___________________________________ Insulin type: ______________________________ Amount (dose): _______________ Time (a.m./p.m.): _______________ Notes: ___________________________________ Insulin type: ______________________________ Amount (dose): _______________ Time (a.m./p.m.): _______________ Notes: ___________________________________ Insulin type: ______________________________ Amount (dose): _______________ Time (a.m./p.m.): _______________ Notes: ___________________________________ Insulin type: ______________________________ Amount (dose): _______________ Time (a.m./p.m.): _______________ Notes: ___________________________________ Managing blood glucose  Check your blood glucose levels using a glucose monitor as told by your health care provider. Write down the times that you check your glucose levels here: Time: _______________ Notes: ___________________________________ Time: _______________ Notes: ___________________________________ Time: _______________ Notes: ___________________________________ Time: _______________ Notes: ___________________________________ Time: _______________ Notes: ___________________________________ Time: _______________ Notes: ___________________________________  Low blood glucose Low blood glucose (hypoglycemia) is when glucose is at or below 70 mg/dL (3.9 mmol/L). Symptoms may include: Feeling: Hungry. Sweaty and clammy. Irritable or easily upset. Dizzy. Sleepy. Having: A fast heartbeat. A headache. A change in your vision. Numbness around the mouth, lips, or tongue. Having trouble with: Moving (coordination). Sleeping. Treating low blood glucose To treat low blood  glucose, eat or drink something containing sugar right away. If you can think clearly and swallow safely, follow the 15:15 rule: Take 15 grams of a fast-acting carb (carbohydrate), as told by your health care provider. Some fast-acting carbs are: Glucose tablets: take 3-4 tablets. Hard candy: eat 3-5 pieces. Fruit juice: drink 4 oz (120 mL). Regular (not diet) soda: drink 4-6 oz (120-180 mL). Honey or sugar: eat 1 Tbsp (15 mL). Check your blood glucose levels 15 minutes after you take the carb. If your glucose is still at or below 70 mg/dL (3.9 mmol/L), take 15 grams of a carb again. If your glucose does not go above 70 mg/dL (3.9 mmol/L) after   3 tries, get help right away. After your glucose goes back to normal, eat a meal or a snack within 1 hour. Treating very low blood glucose If your glucose is at or below 54 mg/dL (3 mmol/L), you have very low blood glucose (severe hypoglycemia). This is an emergency. Do not wait to see if the symptoms will go away. Get medical help right away. Call your local emergency services (911 in the [place] Do not drive yourself to the hospital. Questions to ask your health care provider Should I talk with a diabetes educator? What equipment will I need to care for myself at home? What diabetes medicines do I need? When should I take them? How often do I need to check my blood glucose levels? What number can I call if I have questions? When is my follow-up visit? Where can I find a support group for people with diabetes? Where to find more information American Diabetes Association: www.diabetes.org Association of Diabetes Care and Education Specialists: www.diabeteseducator.org Contact a health care provider if: Your blood glucose is at or above 240 mg/dL (13.3 mmol/L) for 2 days in a row. You have been sick or have had a fever for 2 days or more, and you are not getting better. You have any of these problems for more than 6 hours: You cannot eat or  drink. You feel nauseous. You vomit. You have diarrhea. Get help right away if: Your blood glucose is lower than 54 mg/dL (3 mmol/L). You get confused. You have trouble thinking clearly. You have trouble breathing. These symptoms may represent a serious problem that is an emergency. Do not wait to see if the symptoms will go away. Get medical help right away. Call your local emergency services (911 in the [place] Do not drive yourself to the hospital. Summary Diabetes mellitus is a chronic disease that occurs when the body does not properly use sugar (glucose) that is released from food after you eat. Take insulin and diabetes medicines as told. Check your blood glucose every day, as often as told. Keep all follow-up visits. This is important. This information is not intended to replace advice given to you by your health care provider. Make sure you discuss any questions you have with your health care provider. Document Revised: 12/14/2019 Document Reviewed: 12/14/2019 Elsevier Patient Education  2023 Elsevier Inc.  

## 2022-08-16 ENCOUNTER — Ambulatory Visit: Payer: Medicare PPO | Admitting: Nurse Practitioner

## 2022-08-16 ENCOUNTER — Encounter: Payer: Self-pay | Admitting: Nurse Practitioner

## 2022-08-16 VITALS — BP 121/74 | HR 69 | Temp 97.5°F | Ht 63.5 in | Wt 221.0 lb

## 2022-08-16 DIAGNOSIS — Z950 Presence of cardiac pacemaker: Secondary | ICD-10-CM

## 2022-08-16 DIAGNOSIS — E1159 Type 2 diabetes mellitus with other circulatory complications: Secondary | ICD-10-CM | POA: Diagnosis not present

## 2022-08-16 DIAGNOSIS — Z0000 Encounter for general adult medical examination without abnormal findings: Secondary | ICD-10-CM | POA: Diagnosis not present

## 2022-08-16 DIAGNOSIS — E785 Hyperlipidemia, unspecified: Secondary | ICD-10-CM

## 2022-08-16 DIAGNOSIS — E1169 Type 2 diabetes mellitus with other specified complication: Secondary | ICD-10-CM

## 2022-08-16 DIAGNOSIS — I440 Atrioventricular block, first degree: Secondary | ICD-10-CM

## 2022-08-16 DIAGNOSIS — G620 Drug-induced polyneuropathy: Secondary | ICD-10-CM

## 2022-08-16 DIAGNOSIS — M5442 Lumbago with sciatica, left side: Secondary | ICD-10-CM

## 2022-08-16 DIAGNOSIS — Z23 Encounter for immunization: Secondary | ICD-10-CM

## 2022-08-16 DIAGNOSIS — I152 Hypertension secondary to endocrine disorders: Secondary | ICD-10-CM

## 2022-08-16 DIAGNOSIS — G8929 Other chronic pain: Secondary | ICD-10-CM

## 2022-08-16 DIAGNOSIS — T451X5A Adverse effect of antineoplastic and immunosuppressive drugs, initial encounter: Secondary | ICD-10-CM

## 2022-08-16 LAB — BAYER DCA HB A1C WAIVED: HB A1C (BAYER DCA - WAIVED): 6.2 % — ABNORMAL HIGH (ref 4.8–5.6)

## 2022-08-16 MED ORDER — TIZANIDINE HCL 2 MG PO TABS: 2.0000 mg | ORAL_TABLET | Freq: Three times a day (TID) | ORAL | 0 refills | Status: DC | PRN

## 2022-08-16 MED ORDER — METHYLPREDNISOLONE 4 MG PO TBPK: ORAL_TABLET | ORAL | 0 refills | Status: DC

## 2022-08-16 NOTE — Assessment & Plan Note (Signed)
BMI 38.53 with T2DM and HTN.  Recommended eating smaller high protein, low fat meals more frequently and exercising 30 mins a day 5 times a week with a goal of 10-15 lb weight loss in the next 3 months. Patient voiced their understanding and motivation to adhere to these recommendations.

## 2022-08-16 NOTE — Progress Notes (Signed)
BP 121/74   Pulse 69   Temp (!) 97.5 F (36.4 C) (Oral)   Ht 5' 3.5" (1.613 m)   Wt 221 lb (100.2 kg)   SpO2 97%   BMI 38.53 kg/m    Subjective:    Patient ID: [name], female    DOB: 12-16-1950, 71 y.o.   MRN: [id number]  HPI: [name] is a 71 y.o. female  Chief Complaint  Patient presents with   Diabetes   Hypertension   Hyperlipidemia   DIABETES A1c on last check 6% September.  Continues on Jardiance 25 MG daily.  Since June has lost 8 pounds, working on diet and exercise.  History of breast cancer, takes Lyrica TID for chemotherapy-induced neuropathy.  Was followed by oncology and now to return as needed only, history of low magnesium, but recent levels have been stable since initiating Jardiance -- taking Magnesium and Amiloride. Hypoglycemic episodes:no Polydipsia/polyuria: no Visual disturbance: no Chest pain: no Paresthesias: no Glucose Monitoring: yes  Accucheck frequency: Daily  Fasting glucose: 117 this morning -- 108 yesterday  Post prandial:  Evening:  Before meals: Taking Insulin?: no  Long acting insulin:  Short acting insulin: Blood Pressure Monitoring: a few times a month Retinal Examination: Up to Date -- in July [facility] Foot Exam: Up to Date Pneumovax: Up to Date Influenza: Up to Date Aspirin: yes   HYPERTENSION / HYPERLIPIDEMIA Continues on Atenolol and Atorvastatin.  Followed by cardiology, last visit 02/19/22.  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 Satisfied with current treatment? yes Duration of hypertension: chronic BP monitoring frequency: a few times a month BP range: 120/70 range on average BP medication side effects: no Duration of hyperlipidemia: chronic Cholesterol medication side effects: no Cholesterol supplements: none Medication compliance: good compliance Aspirin: yes Recent stressors: no Recurrent headaches: no Visual changes: no Palpitations: no Dyspnea: no Chest  pain: no Lower extremity edema: no Dizzy/lightheaded: no  BACK PAIN Chronic issue with sciatic left side -- has Meloxicam from ortho, but this offers no benefit.  Started hurting again several months ago.  Last imaging 05/16/21 noted "mild levoscoliosis centered 5 L2-L3. Lower lumbar facet arthropathy".  Goes to massage therapy and chiropractor.   Duration:  chronic with acute flare Mechanism of injury: unknown Location: Left and low back Onset: gradual Severity: 5/10 Quality: dull, aching, and throbbing Frequency: intermittent Radiation: L leg below the knee Aggravating factors: lifting, movement, and prolonged sitting Alleviating factors: chiropractor, massage, PT exercises, Lyrica, Tylenol Status: fluctuating Treatments attempted: rest, ice, heat, APAP, ibuprofen, and physical therapy  Relief with NSAIDs?: moderate Nighttime pain:  no Paresthesias / decreased sensation:  no Bowel / bladder incontinence:  no Fevers:  no Dysuria / urinary frequency:  no   Relevant past medical, surgical, family and social history reviewed and updated as indicated. Interim medical history since our last visit reviewed. Allergies and medications reviewed and updated.  Review of Systems  Constitutional:  Negative for activity change, appetite change, diaphoresis, fatigue and fever.  Respiratory:  Negative for cough, chest tightness and shortness of breath.   Cardiovascular:  Negative for chest pain, palpitations and leg swelling.  Gastrointestinal: Negative.   Musculoskeletal:  Positive for back pain.  Neurological: Negative.   Psychiatric/Behavioral: Negative.     Per HPI unless specifically indicated above     Objective:    BP 121/74   Pulse 69   Temp (!) 97.5 F (36.4 C) (Oral)  Ht 5' 3.5" (1.613 m)   Wt 221 lb (100.2 kg)   SpO2 97%   BMI 38.53 kg/m   Wt Readings from Last 3 Encounters:  08/16/22 221 lb (100.2 kg)  05/16/22 227 lb 12.8 oz (103.3 kg)  02/19/22 229 lb 3.2 oz (104  kg)    Physical Exam Vitals and nursing note reviewed.  Constitutional:      General: She is awake. She is not in acute distress.    Appearance: She is well-developed and well-groomed. She is morbidly obese. She is not ill-appearing.  HENT:     Head: Normocephalic.     Right Ear: Hearing, ear canal and external ear normal.     Left Ear: Hearing, ear canal and external ear normal.     Mouth/Throat:     Mouth: Mucous membranes are moist.  Eyes:     General: Lids are normal.        Right eye: No discharge.        Left eye: No discharge.     Conjunctiva/sclera: Conjunctivae normal.     Pupils: Pupils are equal, round, and reactive to light.  Neck:     Thyroid: No thyromegaly.     Vascular: No carotid bruit.  Cardiovascular:     Rate and Rhythm: Normal rate and regular rhythm.     Heart sounds: Normal heart sounds. No murmur heard.    No gallop.  Pulmonary:     Effort: Pulmonary effort is normal. No accessory muscle usage or respiratory distress.     Breath sounds: Normal breath sounds.  Abdominal:     General: Bowel sounds are normal.     Palpations: Abdomen is soft.  Musculoskeletal:     Cervical back: Normal range of motion and neck supple.     Right lower leg: No edema.     Left lower leg: No edema.  Skin:    General: Skin is warm and dry.  Neurological:     Mental Status: She is alert and oriented to person, place, and time.  Psychiatric:        Attention and Perception: Attention normal.        Mood and Affect: Mood normal.        Speech: Speech normal.        Behavior: Behavior normal. Behavior is cooperative.        Thought Content: Thought content normal.    Results for orders placed or performed in visit on 08/16/22  Bayer DCA Hb A1c Waived  Result Value Ref Range   HB A1C (BAYER DCA - WAIVED) 6.2 (H) 4.8 - 5.6 %      Assessment & Plan:   Problem List Items Addressed This Visit       Cardiovascular and Mediastinum   AV BLOCK, 1ST DEGREE    Stable.   Followed by cardiology, continue collaboration.  Recent note reviewed.      Hypertension associated with diabetes (HCC)    Chronic, stable.  BP at goal at home and in office.  Continue current medication regimen and adjust as needed + continue collaboration with cardiology.  LABS: CMP.  Recommend she continue to monitor BP at home and focus on DASH diet.  Return in 3 months.      Relevant Orders   Bayer DCA Hb A1c Waived (Completed)   Comprehensive metabolic panel     Endocrine   Diabetes mellitus associated with hormonal etiology ([place])    Chronic, ongoing with A1c 6.2% today.  Urine micro ALB 10 and A:C <30 in March 2023. Continue current medication regimen as A1c at goal and no hypoglycemia at home. If continued stable A1c trend may consider reduction of medication.  Continue to monitor BS at home daily.   Return in 3 months for visit.      Relevant Orders   Bayer DCA Hb A1c Waived (Completed)   Comprehensive metabolic panel   Hyperlipidemia associated with type 2 diabetes mellitus (HCC)    Chronic, ongoing.  Continue current medication regimen and adjust as needed.  Lipid panel today.      Relevant Orders   Bayer DCA Hb A1c Waived (Completed)   Comprehensive metabolic panel   Lipid Panel w/o Chol/HDL Ratio     Nervous and Auditory   Chemotherapy-induced neuropathy (HCC)    Chronic, ongoing.  Continue Lyrica and adjust as needed, especially with aging monitor dose closely.  Refills up to date.  Return in 3 months.      Relevant Medications   tiZANidine (ZANAFLEX) 2 MG tablet   Other Relevant Orders   Comprehensive metabolic panel   Chronic left-sided low back pain with left-sided sciatica    Ongoing with current flare.  Will send in steroid taper to take as needed, about to travel in car longer distance, and Tizanidine low dose -- educated her on these.  To not take muscle relaxer if driving and steroids may increase sugars a little, to monitor.  Continue Tylenol and Lyrica +  chiropractor and massage.  May need PT referral placed after Christmas.      Relevant Medications   tiZANidine (ZANAFLEX) 2 MG tablet     Other   Hypomagnesemia    Ongoing and stable.  Recent level normal. Continue amiloride and follow up with nephrology. Magnesium rechecked today.      Relevant Orders   Comprehensive metabolic panel   Magnesium   Morbid obesity ([place])    BMI 38.53 with T2DM and HTN.  Recommended eating smaller high protein, low fat meals more frequently and exercising 30 mins a day 5 times a week with a goal of 10-15 lb weight loss in the next 3 months. Patient voiced their understanding and motivation to adhere to these recommendations.       PPM-[employer]    Continue collaboration with cardiology for checks.      Other Visit Diagnoses     Medicare annual wellness visit, subsequent    -  Primary   Medicare wellness due and performed at visit today.        Follow up plan: Return in about 3 months (around 11/15/2022) for Annual physical and diabetes check after 11/14/22.

## 2022-08-16 NOTE — Assessment & Plan Note (Signed)
Chronic, ongoing.  Continue current medication regimen and adjust as needed. Lipid panel today. 

## 2022-08-16 NOTE — Assessment & Plan Note (Signed)
Chronic, ongoing with A1c 6.2% today. Urine micro ALB 10 and A:C <30 in March 2023. Continue current medication regimen as A1c at goal and no hypoglycemia at home. If continued stable A1c trend may consider reduction of medication.  Continue to monitor BS at home daily.   Return in 3 months for visit.

## 2022-08-16 NOTE — Assessment & Plan Note (Signed)
Ongoing with current flare.  Will send in steroid taper to take as needed, about to travel in car longer distance, and Tizanidine low dose -- educated her on these.  To not take muscle relaxer if driving and steroids may increase sugars a little, to monitor.  Continue Tylenol and Lyrica + chiropractor and massage.  May need PT referral placed after Christmas.

## 2022-08-16 NOTE — Assessment & Plan Note (Signed)
Ongoing and stable.  Recent level normal. Continue amiloride and follow up with nephrology. Magnesium rechecked today.

## 2022-08-16 NOTE — Assessment & Plan Note (Signed)
Chronic, ongoing.  Continue Lyrica and adjust as needed, especially with aging monitor dose closely.  Refills up to date.  Return in 3 months.

## 2022-08-16 NOTE — Assessment & Plan Note (Signed)
Continue collaboration with cardiology for checks.

## 2022-08-16 NOTE — Assessment & Plan Note (Signed)
Stable.  Followed by cardiology, continue collaboration.  Recent note reviewed.

## 2022-08-16 NOTE — Assessment & Plan Note (Signed)
Chronic, stable.  BP at goal at home and in office.  Continue current medication regimen and adjust as needed + continue collaboration with cardiology.  LABS: CMP.  Recommend she continue to monitor BP at home and focus on DASH diet.  Return in 3 months.

## 2022-08-17 LAB — COMPREHENSIVE METABOLIC PANEL
ALT: 20 IU/L (ref 0–32)
AST: 25 IU/L (ref 0–40)
Albumin/Globulin Ratio: 2.4 — ABNORMAL HIGH (ref 1.2–2.2)
Albumin: 4.6 g/dL (ref 3.8–4.8)
Alkaline Phosphatase: 105 IU/L (ref 44–121)
BUN/Creatinine Ratio: 18 (ref 12–28)
BUN: 20 mg/dL (ref 8–27)
Bilirubin Total: 0.4 mg/dL (ref 0.0–1.2)
CO2: 25 mmol/L (ref 20–29)
Calcium: 9.6 mg/dL (ref 8.7–10.3)
Chloride: 104 mmol/L (ref 96–106)
Creatinine, Ser: 1.14 mg/dL — ABNORMAL HIGH (ref 0.57–1.00)
Globulin, Total: 1.9 g/dL (ref 1.5–4.5)
Glucose: 116 mg/dL — ABNORMAL HIGH (ref 70–99)
Potassium: 4.5 mmol/L (ref 3.5–5.2)
Sodium: 142 mmol/L (ref 134–144)
Total Protein: 6.5 g/dL (ref 6.0–8.5)
eGFR: 51 mL/min/{1.73_m2} — ABNORMAL LOW (ref >59)

## 2022-08-17 LAB — LIPID PANEL W/O CHOL/HDL RATIO
Cholesterol, Total: 142 mg/dL (ref 100–199)
HDL: 47 mg/dL (ref >39)
LDL Chol Calc (NIH): 73 mg/dL (ref 0–99)
Triglycerides: 121 mg/dL (ref 0–149)
VLDL Cholesterol Cal: 22 mg/dL (ref 5–40)

## 2022-08-17 LAB — MAGNESIUM: Magnesium: 1.7 mg/dL (ref 1.6–2.3)

## 2022-08-18 NOTE — Progress Notes (Signed)
Contacted via MyChart   Good evening [name], your labs have returned: - Kidney function, creatinine and eGFR, continues to show some kidney disease stage 3a with no worsening.  Liver function, AST and ALT, is normal. - Cholesterol labs remain stable. - Magnesium remains normal.  Overall stable labs.  Also I forgot to tell you our favorite eye doctor is returning to town, [place], she will be at [facility] starting in January:) Keep being amazing!!  Thank you for allowing me to participate in your care.  I appreciate you. Kindest regards, Donia Yokum

## 2022-08-22 ENCOUNTER — Ambulatory Visit: Payer: Medicare PPO | Attending: Internal Medicine | Admitting: Internal Medicine

## 2022-08-22 ENCOUNTER — Encounter: Payer: Self-pay | Admitting: Internal Medicine

## 2022-08-22 VITALS — BP 130/72 | HR 71 | Ht 63.5 in | Wt 223.5 lb

## 2022-08-22 DIAGNOSIS — I440 Atrioventricular block, first degree: Secondary | ICD-10-CM

## 2022-08-22 DIAGNOSIS — Z950 Presence of cardiac pacemaker: Secondary | ICD-10-CM | POA: Diagnosis not present

## 2022-08-22 DIAGNOSIS — I5032 Chronic diastolic (congestive) heart failure: Secondary | ICD-10-CM | POA: Diagnosis not present

## 2022-08-22 LAB — PACEMAKER DEVICE OBSERVATION

## 2022-08-22 NOTE — Patient Instructions (Signed)
Medication Instructions:  - Your physician recommends that you continue on your current medications as directed. Please refer to the Current Medication list given to you today.  *If you need a refill on your cardiac medications before your next appointment, please call your pharmacy*   Lab Work: - none ordered  If you have labs (blood work) drawn today and your tests are completely normal, you will receive your results only by: [place] (if you have MyChart) OR A paper copy in the mail If you have any lab test that is abnormal or we need to change your treatment, we will call you to review the results.   Testing/Procedures: - none ordered    Follow-Up: At [facility], you and your health needs are our priority.  As part of our continuing mission to provide you with exceptional heart care, we have created designated Provider Care Teams.  These Care Teams include your primary Cardiologist (physician) and Advanced Practice Providers (APPs -  Physician Assistants and Nurse Practitioners) who all work together to provide you with the care you need, when you need it.  We recommend signing up for the patient portal called "MyChart".  Sign up information is provided on this After Visit Summary.  MyChart is used to connect with patients for Virtual Visits (Telemedicine).  Patients are able to view lab/test results, encounter notes, upcoming appointments, etc.  Non-urgent messages can be sent to your provider as well.   To learn more about what you can do with MyChart, go to [email or web address].    Your next appointment:   10 month(s)  The format for your next appointment:   In Person  Provider:   Virl Axe, MD    Other Instructions N/a  Important Information About Sugar

## 2022-08-22 NOTE — Progress Notes (Signed)
Patient ID: [name], female   DOB: [birth year], 71 y.o.   MRN: [id number] Man    Electrophysiology Office Note   Date:  08/22/2022   ID:  Kelsey, [name] 12/17/1950, MRN [id number]  PCP:  Kelsey Lick, NP  Cardiologist:  none Primary Electrophysiologist:      Chief Complaint  Patient presents with   6 month follow up     "Doing well." Medications reviewed by the patient verbally.       History of Present Illness: [name] is a 71 y.o. 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 for which she underwent pacing remotely w/ generator replacement Dec 2011    Hx of breast CA 2000's   Echo 06/2010 normal LV function   The patient denies chest pain, shortness of breath, nocturnal dyspnea, orthopnea or peripheral edema.  There have been no palpitations, lightheadedness or syncope.  Complains of interval surgery for carpal tunnel with relief of pain up to her shoulder.  Has been more sedentary of late.  Had RSV spring 2023 with residual fatigue.   Struggles also with lower extremity arthritis as a relates to activity..     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   Migraines    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   Ovarian cyst 11/06/2020   Pacemaker [employer] 2004   Personal history of chemotherapy 2009   left breast ca   Personal history of radiation  therapy 2009   left breast   Sinus tachycardia    Type 2 diabetes mellitus ([place]) 06/19/2015   Past Surgical History:  Procedure Laterality Date   ANKLE SURGERY Left    x 3   BREAST EXCISIONAL BIOPSY Left 2009    Stage IIa, ER/PR negative, 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,   BREAST LUMPECTOMY Left 2009   chemo and rad tx   CARPAL TUNNEL RELEASE     CESAREAN SECTION  1983   CHOLECYSTECTOMY     EYE SURGERY Left 12/2018   cataracts-Dr.Shah in [place] Right 01/2019   cataracts- Dr.Shah in [place] / REPLACE / REMOVE PACEMAKER  2011   revision @ [place]  05/13/2003   Guidant Rodman Comp - DR   ROBOTIC ASSISTED SALPINGO OOPHERECTOMY Right 01/18/2021   Procedure: XI ROBOTIC ASSISTED RIGHT SALPINGO-OOPHORECTOMY;  Surgeon: Homero Fellers, MD;  Location: [facility] ORS;  Service: Gynecology;  Laterality: Right;   Allergies:   Penicillins, Levofloxacin, and Other   Physical Exam: BP 130/72 (BP Location: Right Arm, Patient Position: Sitting, Cuff Size: Large)   Pulse 71  Ht 5' 3.5" (1.613 m)   Wt 223 lb 8 oz (101.4 kg)   SpO2 95%   BMI 38.97 kg/m  Well developed and well nourished in no acute distress HENT normal Neck supple with JVP-flat Clear Device pocket well healed; without hematoma or erythema.  There is no tethering  Regular rate and rhythm, no   gallop No  murmur Abd-soft with active BS No Clubbing cyanosis  edema Skin-warm and dry A & Oriented  Grossly normal sensory and motor function  ECG sinus at 71 Interval 23/08/38  Device function is normal. Programming changes decreased lower pacing rate from 60--50 to reduce ventricular pacing percentage now at 7% See Paceart for details.     ASSESSMENT AND PLAN: Dyspnea on Exertion/HFpEF  1 AV block--intermittent  Hypertension  Diabetes  Hypomagnesemia  Pacemaker [employer]     Vpacing decreased  30>>>>6%   Ventricular pacing percentage stable at the 6-7%, likely some degree of fusion.  Will decrease lower rate limit of 50 to try to reduce this further.  Blood pressure well-controlled.  Continue on Tenormin  Device is approaching ERI.  Will see her again in 9 months.  Hypomagnesemia has also been improved with adjunctive use of the SGLT2, this has been previously reported we will continue

## 2022-08-23 ENCOUNTER — Other Ambulatory Visit: Payer: Self-pay | Admitting: Nurse Practitioner

## 2022-08-24 NOTE — Telephone Encounter (Signed)
Requested medication (s) are due for refill today: no  Requested medication (s) are on the active medication list: yes  Last refill:  08/16/22  Future visit scheduled:yes  Notes to clinic:  Unable to refill per protocol, cannot delegate. Unable to refuse.      Requested Prescriptions  Pending Prescriptions Disp Refills   tiZANidine (ZANAFLEX) 2 MG tablet [Pharmacy Med Name: TIZANIDINE HCL 2 MG TABLET] 30 tablet 0    Sig: Take 1 tablet (2 mg total) by mouth every 8 (eight) hours as needed for muscle spasms.     Not Delegated - Cardiovascular:  Alpha-2 Agonists - tizanidine Failed - 08/23/2022 12:02 PM      Failed - This refill cannot be delegated      Passed - Valid encounter within last 6 months    Recent Outpatient Visits           1 week ago Medicare annual wellness visit, subsequent   [employer], [place] T, NP   3 months ago Diabetes mellitus associated with hormonal etiology ([place])   [place], [name], NP   6 months ago Diabetes mellitus associated with hormonal etiology ([place])   [place], [name], NP   9 months ago Diabetes mellitus associated with hormonal etiology ([place])   [place] [name], [name], NP   1 year ago Diabetes mellitus associated with hormonal etiology ([place])   [place], [name], NP       Future Appointments             In 3 months [name], [name], NP [employer], [name]

## 2022-10-30 ENCOUNTER — Telehealth: Payer: Self-pay | Admitting: Nurse Practitioner

## 2022-10-30 NOTE — Telephone Encounter (Signed)
Pt is calling to report that she received a letter from [facility] that Henrine Screws is not approved to accept her insurance on 09/05/22. Please advise CB- [phone]

## 2022-10-30 NOTE — Telephone Encounter (Signed)
Called patient to let her know that the letter was sent in error and to disregard.

## 2022-11-01 ENCOUNTER — Encounter: Payer: Self-pay | Admitting: Internal Medicine

## 2022-11-07 ENCOUNTER — Other Ambulatory Visit: Payer: Self-pay | Admitting: Nurse Practitioner

## 2022-11-07 DIAGNOSIS — T451X5A Adverse effect of antineoplastic and immunosuppressive drugs, initial encounter: Secondary | ICD-10-CM

## 2022-11-07 DIAGNOSIS — G620 Drug-induced polyneuropathy: Secondary | ICD-10-CM

## 2022-11-07 NOTE — Telephone Encounter (Signed)
Requested medication (s) are due for refill today - yes  Requested medication (s) are on the active medication list -yes  Future visit scheduled - yes  Last refill: -08/12/22 #270  Notes to clinic: non delegated Rx  Requested Prescriptions  Pending Prescriptions Disp Refills   pregabalin (LYRICA) 50 MG capsule [Pharmacy Med Name: PREGABALIN 50 MG CAPSULE] 270 capsule 0    Sig: Take 1 capsule (50 mg total) by mouth 3 (three) times daily.     Not Delegated - Neurology:  Anticonvulsants - Controlled - pregabalin Failed - 11/07/2022  8:04 AM      Failed - This refill cannot be delegated      Failed - Cr in normal range and within 360 days    Creatinine, Ser  Date Value Ref Range Status  08/16/2022 1.14 (H) 0.57 - 1.00 mg/dL Final         Passed - Completed PHQ-2 or PHQ-9 in the last 360 days      Passed - Valid encounter within last 12 months    Recent Outpatient Visits           2 months ago Medicare annual wellness visit, subsequent   [place], Isabel T, NP   5 months ago Diabetes mellitus associated with hormonal etiology ([place])   [place], [place] T, NP   8 months ago Diabetes mellitus associated with hormonal etiology ([place])   [place], [name], NP   11 months ago Diabetes mellitus associated with hormonal etiology ([place])   [place], [name], NP   1 year ago Diabetes mellitus associated with hormonal etiology ([place])   [place], [name], NP       Future Appointments             In 2 weeks [name], [name], NP [place], [name]               Requested Prescriptions  Pending Prescriptions Disp Refills   pregabalin (LYRICA) 50 MG capsule [Pharmacy Med Name: PREGABALIN 50 MG CAPSULE] 270 capsule 0    Sig: Take 1 capsule (50 mg total) by mouth 3 (three) times  daily.     Not Delegated - Neurology:  Anticonvulsants - Controlled - pregabalin Failed - 11/07/2022  8:04 AM      Failed - This refill cannot be delegated      Failed - Cr in normal range and within 360 days    Creatinine, Ser  Date Value Ref Range Status  08/16/2022 1.14 (H) 0.57 - 1.00 mg/dL Final         Passed - Completed PHQ-2 or PHQ-9 in the last 360 days      Passed - Valid encounter within last 12 months    Recent Outpatient Visits           2 months ago Medicare annual wellness visit, subsequent   [place], [place] T, NP   5 months ago Diabetes mellitus associated with hormonal etiology ([place])   [place], [name], NP   8 months ago Diabetes mellitus associated with hormonal etiology ([place])   [place], [name], NP   11 months ago Diabetes mellitus associated with hormonal etiology ([place])   [place], [name], NP   1  year ago Diabetes mellitus associated with hormonal etiology ([place])   [place], [name], NP       Future Appointments             In 2 weeks [name], [name], NP [place], [name]

## 2022-11-18 ENCOUNTER — Other Ambulatory Visit: Payer: Self-pay | Admitting: Nurse Practitioner

## 2022-11-19 NOTE — Telephone Encounter (Signed)
Requested Prescriptions  Pending Prescriptions Disp Refills   fluticasone (FLONASE) 50 MCG/ACT nasal spray [Pharmacy Med Name: FLUTICASONE PROP 50 MCG SPRAY] 48 g 0    Sig: Place 2 sprays into both nostrils daily.     Ear, Nose, and Throat: Nasal Preparations - Corticosteroids Passed - 11/18/2022  8:16 AM      Passed - Valid encounter within last 12 months    Recent Outpatient Visits           3 months ago Medicare annual wellness visit, subsequent   [name], [name], NP   6 months ago Diabetes mellitus associated with hormonal etiology ([place])   [name], [name], NP   9 months ago Diabetes mellitus associated with hormonal etiology ([place])   [name], [name], NP   1 year ago Diabetes mellitus associated with hormonal etiology ([place])   [name], [name], NP   1 year ago Diabetes mellitus associated with hormonal etiology ([place])   [name], [name], NP       Future Appointments             In 1 week [name], [name], [name], [name]

## 2022-11-24 NOTE — Patient Instructions (Incomplete)
Please call to schedule your mammogram and/or bone density: [facility]  Address: [address], [place], [place] [address] Phone: [phone]  [facility] [address] [place],  [place]  [address] Phone: [phone]    Diabetes Mellitus and Standards of Medical Care Living with and managing diabetes (diabetes mellitus) can be complicated. Your diabetes treatment may be managed by a team of health care providers, including: A physician who specializes in diabetes (endocrinologist). You might also have visits with a nurse practitioner or physician assistant. Nurses. A registered dietitian. A certified diabetes care and education specialist. An exercise specialist. A pharmacist. An eye doctor. A foot specialist (podiatrist). A dental care provider. A primary care provider. A mental health care provider. How to manage your diabetes You can do many things to successfully manage your diabetes. Your health care providers will follow guidelines to help you get the best quality of care. Here are general guidelines for your diabetes management plan. Your health care providers may give you more specific instructions. Physical exams When you are diagnosed with diabetes, and each year after that, your health care provider will ask about your medical and family history. You will have a physical exam, which may include: Measuring your height, weight, and body mass index (BMI). Checking your blood pressure. This will be done at every routine medical visit. Your target blood pressure may vary depending on your medical conditions, your age, and other factors. A thyroid exam. A skin exam. Screening for nerve damage (peripheral neuropathy). This may include checking the pulse in your legs and feet and the level of sensation in your hands and feet. A foot exam to inspect the structure and skin of your feet, including  checking for cuts, bruises, redness, blisters, sores, or other problems. Screening for blood vessel (vascular) problems. This may include checking the pulse in your legs and feet and checking your temperature. Blood tests Depending on your treatment plan and your personal needs, you may have the following tests: Hemoglobin A1C (HbA1C). This test provides information about blood sugar (glucose) control over the previous 2-3 months. It is used to adjust your treatment plan, if needed. This test will be done: At least 2 times a year, if you are meeting your treatment goals. 4 times a year, if you are not meeting your treatment goals or if your goals have changed. Lipid testing, including total cholesterol, LDL and HDL cholesterol, and triglyceride levels. The goal for LDL is less than 100 mg/dL (5.5 mmol/L). If you are at high risk for complications, the goal is less than 70 mg/dL (3.9 mmol/L). The goal for HDL is 40 mg/dL (2.2 mmol/L) or higher for men, and 50 mg/dL (2.8 mmol/L) or higher for women. An HDL cholesterol of 60 mg/dL (3.3 mmol/L) or higher gives some protection against heart disease. The goal for triglycerides is less than 150 mg/dL (8.3 mmol/L). Liver function tests. Kidney function tests. Thyroid function tests.  Dental and eye exams  Visit your dentist two times a year. If you have type 1 diabetes, your health care provider may recommend an eye exam within 5 years after you are diagnosed, and then once a year after your first exam. For children with type 1 diabetes, the health care provider may recommend an eye exam when your child is age 45 or older and has had diabetes for 3-5 years. After the first exam, your child should get an eye exam  once a year. If you have type 2 diabetes, your health care provider may recommend an eye exam as soon as you are diagnosed, and then every 1-2 years after your first exam. Immunizations A yearly flu (influenza) vaccine is recommended annually  for everyone 6 months or older. This is especially important if you have diabetes. The pneumonia (pneumococcal) vaccine is recommended for everyone 2 years or older who has diabetes. If you are age 75 or older, you may get the pneumonia vaccine as a series of two separate shots. The hepatitis B vaccine is recommended for adults shortly after being diagnosed with diabetes. Adults and children with diabetes should receive all other vaccines according to age-specific recommendations from the Centers for Disease Control and Prevention (CDC). Mental and emotional health Screening for symptoms of eating disorders, anxiety, and depression is recommended at the time of diagnosis and after as needed. If your screening shows that you have symptoms, you may need more evaluation. You may work with a mental health care provider. Follow these instructions at home: Treatment plan You will monitor your blood glucose levels and may give yourself insulin. Your treatment plan will be reviewed at every medical visit. You and your health care provider will discuss: How you are taking your medicines, including insulin. Any side effects you have. Your blood glucose level target goals. How often you monitor your blood glucose level. Lifestyle habits, such as activity level and tobacco, alcohol, and substance use. Education Your health care provider will assess how well you are monitoring your blood glucose levels and whether you are taking your insulin and medicines correctly. He or she may refer you to: A certified diabetes care and education specialist to manage your diabetes throughout your life, starting at diagnosis. A registered dietitian who can create and review your personal nutrition plan. An exercise specialist who can discuss your activity level and exercise plan. General instructions Take over-the-counter and prescription medicines only as told by your health care provider. Keep all follow-up visits. This  is important. Where to find support There are many diabetes support networks, including: American Diabetes Association (ADA): diabetes.org Defeat Diabetes Foundation: defeatdiabetes.org Where to find more information American Diabetes Association (ADA): www.diabetes.org Association of Diabetes Care & Education Specialists (ADCES): diabeteseducator.org International Diabetes Federation (IDF): http://hill.biz/ Summary Managing diabetes (diabetes mellitus) can be complicated. Your diabetes treatment may be managed by a team of health care providers. Your health care providers follow guidelines to help you get the best quality care. You should have physical exams, blood tests, blood pressure monitoring, immunizations, and screening tests regularly. Stay updated on how to manage your diabetes. Your health care providers may also give you more specific instructions based on your individual health. This information is not intended to replace advice given to you by your health care provider. Make sure you discuss any questions you have with your health care provider. Document Revised: 02/17/2020 Document Reviewed: 02/17/2020 Elsevier Patient Education  2023 [employer].

## 2022-11-26 ENCOUNTER — Ambulatory Visit (INDEPENDENT_AMBULATORY_CARE_PROVIDER_SITE_OTHER): Payer: Medicare PPO | Admitting: Nurse Practitioner

## 2022-11-26 ENCOUNTER — Encounter: Payer: Self-pay | Admitting: Nurse Practitioner

## 2022-11-26 VITALS — BP 122/70 | HR 73 | Temp 97.5°F | Ht 63.6 in | Wt 216.1 lb

## 2022-11-26 DIAGNOSIS — E1169 Type 2 diabetes mellitus with other specified complication: Secondary | ICD-10-CM | POA: Diagnosis not present

## 2022-11-26 DIAGNOSIS — E559 Vitamin D deficiency, unspecified: Secondary | ICD-10-CM

## 2022-11-26 DIAGNOSIS — E1159 Type 2 diabetes mellitus with other circulatory complications: Secondary | ICD-10-CM

## 2022-11-26 DIAGNOSIS — I440 Atrioventricular block, first degree: Secondary | ICD-10-CM

## 2022-11-26 DIAGNOSIS — J301 Allergic rhinitis due to pollen: Secondary | ICD-10-CM

## 2022-11-26 DIAGNOSIS — Z950 Presence of cardiac pacemaker: Secondary | ICD-10-CM

## 2022-11-26 DIAGNOSIS — T451X5A Adverse effect of antineoplastic and immunosuppressive drugs, initial encounter: Secondary | ICD-10-CM

## 2022-11-26 DIAGNOSIS — E538 Deficiency of other specified B group vitamins: Secondary | ICD-10-CM

## 2022-11-26 DIAGNOSIS — Z1211 Encounter for screening for malignant neoplasm of colon: Secondary | ICD-10-CM

## 2022-11-26 DIAGNOSIS — Z0000 Encounter for general adult medical examination without abnormal findings: Secondary | ICD-10-CM | POA: Diagnosis not present

## 2022-11-26 DIAGNOSIS — G620 Drug-induced polyneuropathy: Secondary | ICD-10-CM | POA: Diagnosis not present

## 2022-11-26 DIAGNOSIS — R7989 Other specified abnormal findings of blood chemistry: Secondary | ICD-10-CM

## 2022-11-26 DIAGNOSIS — M65341 Trigger finger, right ring finger: Secondary | ICD-10-CM

## 2022-11-26 DIAGNOSIS — Z853 Personal history of malignant neoplasm of breast: Secondary | ICD-10-CM

## 2022-11-26 DIAGNOSIS — E785 Hyperlipidemia, unspecified: Secondary | ICD-10-CM

## 2022-11-26 DIAGNOSIS — I152 Hypertension secondary to endocrine disorders: Secondary | ICD-10-CM

## 2022-11-26 LAB — MICROALBUMIN, URINE WAIVED
Creatinine, Urine Waived: 50 mg/dL (ref 10–300)
Microalb, Ur Waived: 30 mg/L — ABNORMAL HIGH (ref 0–19)

## 2022-11-26 LAB — BAYER DCA HB A1C WAIVED: HB A1C (BAYER DCA - WAIVED): 6.5 % — ABNORMAL HIGH (ref 4.8–5.6)

## 2022-11-26 NOTE — Assessment & Plan Note (Signed)
Chronic.  Noted past labs, continue supplement and recheck today. 

## 2022-11-26 NOTE — Progress Notes (Signed)
BP 122/70   Pulse 73   Temp (!) 97.5 F (36.4 C) (Oral)   Ht 5' 3.6" (1.615 m)   Wt 216 lb 1.6 oz (98 kg)   SpO2 99%   BMI 37.56 kg/m    Subjective:    Patient ID: [name], female    DOB: 1951-08-12, 72 y.o.   MRN: [id number]  HPI: [name] is a 72 y.o. female presenting on 11/26/2022 for comprehensive medical examination. Current medical complaints include: none  She currently lives with: husband Menopausal Symptoms: no  Currently with trigger finger to ring finger right hand -- started a few weeks ago.  Has had similar in past.  Went to ortho in past and received injections.  Is currently taking Tylenol at home.    DIABETES A1c December 6.2%.  Continues on Jardiance 25 MG daily, previously took Metformin, but switched to SGLT2 for heart health.  Has been traveling a little lately, diet has been a little more sweet then normal. Hypoglycemic episodes:no Polydipsia/polyuria: no Visual disturbance: no Chest pain: no Paresthesias: yes bilateral hands this is chronic Glucose Monitoring:   Accucheck frequency: Daily  Fasting glucose: 112 this morning  Post prandial:  Evening:  Before meals: Taking Insulin?: no  Long acting insulin:  Short acting insulin: Blood Pressure Monitoring: weekly Retinal Examination: Not up to Date, [facility] in July Foot Exam: Up To Date Pneumovax: Up to Date Influenza: Up to Date Aspirin: no   NEUROPATHY Taking Lyrica 50 MG TID. Neuropathy status: controlled  Satisfied with current treatment?: yes Medication side effects: no Medication compliance:  excellent compliance Location: multiple locations Pain: yes Severity: moderate  Quality:  sharp, burning, tender, tearing, throbbing, tingling, creeping, and pins and needles Frequency: intermittent Bilateral: yes Symmetric: yes Numbness: yes Decreased sensation: yes Weakness: no Context: stable Alleviating factors: Lyrica Aggravating factors: unknown  HYPERTENSION /  HYPERLIPIDEMIA Has pacemaker, PPM [employer] -- 1 years on pacemaker. Followed by cardiology and last seen 08/22/22 to return in 9 months.   Current medications: Atenolol, Amiloride, Atorvastatin. Satisfied with current treatment? yes Duration of hypertension: chronic BP monitoring frequency: weekly BP range: <130/80 BP medication side effects: no Past BP meds:  Duration of hyperlipidemia: chronic Cholesterol medication side effects: no Cholesterol supplements: none Past cholesterol medications:  Medication compliance: excellent compliance Aspirin: no Recent stressors: yes Recurrent headaches: occasional at baseline, sinus Visual changes: no Palpitations: no Dyspnea: no Chest pain: no Lower extremity edema: yes, occasional at baseline Dizzy/lightheaded: no   HISTORY OF BREAST CA: Ongoing neuropathy and hypomagnesemia due to cancer history and treatments, was followed by oncology in past.  Received magnesium infusions, at this time is transitioned to PCP monitoring. Takes Lyrica for neuropathy pain + magnesium supplement daily with recent level 1.7 December -- levels have been stable since 05/15/21.  Does take Nexium for GERD -- takes daily.  Continues Vitamin D and B12 for history of low levels.  ELEVATED TSH Recent labs have been stable.  There is a family history of thyroid issues. History of lower B12 and Vitamin D levels past labs. Fatigue: yes due to holidays Cold intolerance: no Heat intolerance: no Weight gain: no Weight loss: no Constipation: no Diarrhea/loose stools: no Palpitations: no Lower extremity edema: no Anxiety/depressed mood: no     11/26/2022    9:04 AM 08/16/2022    9:35 AM 05/16/2022    9:12 AM 02/13/2022    8:28 AM 11/13/2021    9:06 AM  Depression screen  PHQ 2/9  Decreased Interest 0 0 0 0 0  Down, Depressed, Hopeless 0 0 0 0 0  PHQ - 2 Score 0 0 0 0 0  Altered sleeping 0 0 0 0 1  Tired, decreased energy 0 0 0 0 1  Change in appetite 0 0 0  0 0  Feeling bad or failure about yourself  0 0 0 0 0  Trouble concentrating 0 0 0 0 0  Moving slowly or fidgety/restless 0 0 0 0 0  Suicidal thoughts 0 0 0 0 0  PHQ-9 Score 0 0 0 0 2  Difficult doing work/chores  Not difficult at all Not difficult at all Not difficult at all        11/26/2022    9:04 AM 08/16/2022    9:36 AM 05/16/2022    9:13 AM 02/13/2022    8:28 AM  GAD 7 : Generalized Anxiety Score  Nervous, Anxious, on Edge 0 0 0 0  Control/stop worrying 0 0 0 0  Worry too much - different things 0 0 0 0  Trouble relaxing 0 0 0 0  Restless 0 0 0 0  Easily annoyed or irritable 0 0 0 0  Afraid - awful might happen 0 0 0 0  Total GAD 7 Score 0 0 0 0  Anxiety Difficulty  Not difficult at all Not difficult at all Not difficult at all      11/13/2021    9:05 AM 02/13/2022    8:27 AM 05/16/2022    9:12 AM 08/16/2022    9:35 AM 11/26/2022    9:04 AM  Fall Risk  Falls in the past year? 0 0 0 0 0  Was there an injury with Fall? 0 0 0 0 0  Fall Risk Category Calculator 0 0 0 0 0  Fall Risk Category (Retired) Low Low Low Low   (RETIRED) Patient Fall Risk Level Low fall risk Low fall risk Low fall risk    Patient at Risk for Falls Due to No Fall Risks No Fall Risks No Fall Risks No Fall Risks No Fall Risks  Fall risk Follow up Falls evaluation completed Falls evaluation completed Falls evaluation completed Falls evaluation completed Falls evaluation completed    Functional Status Survey: Is the patient deaf or have difficulty hearing?: No Does the patient have difficulty seeing, even when wearing glasses/contacts?: No Does the patient have difficulty concentrating, remembering, or making decisions?: No Does the patient have difficulty walking or climbing stairs?: No Does the patient have difficulty dressing or bathing?: No Does the patient have difficulty doing errands alone such as visiting a doctor's office or shopping?: No   Past Medical History:  Past Medical History:   Diagnosis Date   Arthritis    Breast cancer, left 09/2007   s/p lumpectomy and chemoradiation   Cardiomyopathy secondary    GERD (gastroesophageal reflux disease)    Glaucoma    History of first degree AV block    high grade; PPM placed   History of kidney stones    Hypersomnolence 09/21/2013   Hypertension    Hypothyroidism    no meds currently   Leg edema, left 09/21/2013   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   Migraines    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   Ovarian cyst 11/06/2020   Pacemaker [employer] 2004   Personal history of chemotherapy 2009   left breast ca   Personal history of radiation therapy 2009  left breast   Sinus tachycardia    Type 2 diabetes mellitus 06/19/2015    Surgical History:  Past Surgical History:  Procedure Laterality Date   ANKLE SURGERY Left    x 3   BREAST EXCISIONAL BIOPSY Left 2009    Stage IIa, ER/PR negative, 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,   BREAST LUMPECTOMY Left 2009   chemo and rad tx   CARPAL [place] SURGERY Left 12/2018   cataracts-Dr.Shah in [place] Right 01/2019   cataracts- Dr.Shah in [place] / REPLACE / REMOVE PACEMAKER  2011   revision @ [place]  05/13/2003   [facility] Comp - DR   ROBOTIC ASSISTED SALPINGO OOPHERECTOMY Right 01/18/2021   Procedure: XI ROBOTIC ASSISTED RIGHT SALPINGO-OOPHORECTOMY;  Surgeon: Homero Fellers, MD;  Location: [facility] ORS;  Service: Gynecology;  Laterality: Right;    Medications:  Current Outpatient Medications on File Prior to Visit  Medication Sig   albuterol (VENTOLIN HFA) 108 (90 Base) MCG/ACT inhaler Inhale 2 puffs into the lungs every 6 (six) hours as needed for wheezing or shortness of breath.   aMILoride (MIDAMOR) 5 MG tablet Take 1 tablet (5 mg total) by mouth daily.    aspirin-acetaminophen-caffeine (EXCEDRIN MIGRAINE) 250-250-65 MG tablet Take 2 tablets by mouth every 6 (six) hours as needed for headache or migraine.   atenolol (TENORMIN) 50 MG tablet Take 1 tablet (50 mg total) by mouth daily.   atorvastatin (LIPITOR) 40 MG tablet Take 1 tablet (40 mg total) by mouth daily.   Cholecalciferol (VITAMIN D3) 50 MCG (2000 UT) TABS Take 1 tablet by mouth daily.   CINNAMON PO Take 3 capsules by mouth in the morning.   CRANBERRY-VITAMIN C PO Take 1 tablet by mouth daily.   Cyanocobalamin (VITAMIN B-12 PO) Take 1 tablet by mouth every other day.   empagliflozin (JARDIANCE) 25 MG TABS tablet Take 1 tablet (25 mg total) by mouth daily before breakfast.   esomeprazole (NEXIUM) 20 MG capsule Take 20 mg by mouth every morning.   fluticasone (FLONASE) 50 MCG/ACT nasal spray Place 2 sprays into both nostrils daily.   glucose blood (ACCU-CHEK AVIVA PLUS) test strip TEST BLOOD SUGAR ONCE DAILY   levocetirizine (XYZAL) 5 MG tablet Take 5 mg by mouth every evening.   MAGNESIUM CHLORIDE-CALCIUM PO Take 3 tablets by mouth daily.   magnesium oxide (MAG-OX) 400 MG tablet Take 1,200 mg by mouth daily.   melatonin 3 MG TABS tablet Take 3 mg by mouth at bedtime.   montelukast (SINGULAIR) 10 MG tablet Take 1 tablet (10 mg total) by mouth at bedtime.   nystatin (MYCOSTATIN/NYSTOP) powder Apply 1 Application topically 2 (two) times daily.   pregabalin (LYRICA) 50 MG capsule Take 1 capsule (50 mg total) by mouth 3 (three) times daily.   SUMAtriptan (IMITREX) 100 MG tablet Take 1 tablet (100 mg total) by mouth as needed.   tiZANidine (ZANAFLEX) 2 MG tablet Take 1 tablet (2 mg total) by mouth every 8 (eight) hours as needed for muscle spasms.   No current facility-administered medications on file prior to visit.    Allergies:  Allergies  Allergen Reactions   Penicillins Swelling    Causes organs to swell Other reaction(s): Other (See Comments) Organs flipped over per patient; skin  starting splitting open and  blood vessels burst in legs;  Causes organs to swell   Levofloxacin Swelling    Leg swelling and muscle weakness Other reaction(s): Other (See Comments) Tendon pain and weakness in muscles Leg swelling and muscle weakness   Other Itching    bandaids    Social History:  Social History   Socioeconomic History   Marital status: Divorced    Spouse name: Not on file   Number of children: Not on file   Years of education: Not on file   Highest education level: Bachelor's degree (e.g., BA, AB, BS)  Occupational History   Occupation: Full time    Employer: [employer]  Tobacco Use   Smoking status: Former    Packs/day: 1.00    Years: 15.00    Additional pack years: 0.00    Total pack years: 15.00    Types: Cigarettes    Quit date: 08/26/1981    Years since quitting: 41.2   Smokeless tobacco: Never  Vaping Use   Vaping Use: Never used  Substance and Sexual Activity   Alcohol use: Not Currently   Drug use: No   Sexual activity: Not Currently  Other Topics Concern   Not on file  Social History Narrative   Divorced   Gets regular exercise   Social Determinants of Health   Financial Resource Strain: Low Risk  (06/25/2021)   Overall Financial Resource Strain (CARDIA)    Difficulty of Paying Living Expenses: Not hard at all  Food Insecurity: No Food Insecurity (06/25/2021)   Hunger Vital Sign    Worried About Running Out of Food in the Last Year: Never true    Ran Out of Food in the Last Year: Never true  Transportation Needs: No Transportation Needs (06/25/2021)   PRAPARE - Hydrologist (Medical): No    Lack of Transportation (Non-Medical): No  Physical Activity: Insufficiently Active (06/25/2021)   Exercise Vital Sign    Days of Exercise per Week: 2 days    Minutes of Exercise per Session: 40 min  Stress: No Stress Concern Present (06/25/2021)   [place]    Feeling of Stress : Not at all  Social Connections: Moderately Integrated (03/16/2018)   Social Connection and Isolation Panel [NHANES]    Frequency of Communication with Friends and Family: More than three times a week    Frequency of Social Gatherings with Friends and Family: More than three times a week    Attends Religious Services: More than 4 times per year    Active Member of [employer] or Organizations: Yes    Attends Music therapist: More than 4 times per year    Marital Status: Divorced  Intimate Partner Violence: Not At Risk (03/16/2018)   Humiliation, Afraid, Rape, and Kick questionnaire    Fear of Current or Ex-Partner: No    Emotionally Abused: No    Physically Abused: No    Sexually Abused: No   Social History   Tobacco Use  Smoking Status Former   Packs/day: 1.00   Years: 15.00   Additional pack years: 0.00   Total pack years: 15.00   Types: Cigarettes   Quit date: 08/26/1981   Years since quitting: 41.2  Smokeless Tobacco Never   Social History   Substance and Sexual Activity  Alcohol Use Not Currently    Family History:  Family History  Problem Relation Age of Onset   Diabetes Mother  Heart disease Mother    Thyroid disease Mother    Hypertension Father    Cancer Father    Alcohol abuse Father    Diabetes Father    Stroke Other        Family hx of CVA or stroke   Diabetes Brother    Diabetes Daughter    Diabetes Paternal Uncle    Diabetes Paternal Grandmother    Breast cancer Maternal Aunt     Past medical history, surgical history, medications, allergies, family history and social history reviewed with patient today and changes made to appropriate areas of the chart.   ROS  All other ROS negative except what is listed above and in the HPI.      Objective:    BP 122/70   Pulse 73   Temp (!) 97.5 F (36.4 C) (Oral)   Ht 5' 3.6" (1.615 m)   Wt 216 lb 1.6 oz (98 kg)   SpO2 99%   BMI 37.56  kg/m   Wt Readings from Last 3 Encounters:  11/26/22 216 lb 1.6 oz (98 kg)  08/22/22 223 lb 8 oz (101.4 kg)  08/16/22 221 lb (100.2 kg)    Physical Exam Vitals and nursing note reviewed. Exam conducted with a chaperone present.  Constitutional:      General: She is awake. She is not in acute distress.    Appearance: She is well-developed and well-groomed. She is obese. She is not ill-appearing or toxic-appearing.  HENT:     Head: Normocephalic and atraumatic.     Right Ear: Hearing, tympanic membrane, ear canal and external ear normal. No drainage.     Left Ear: Hearing, tympanic membrane, ear canal and external ear normal. No drainage.     Nose: Nose normal.     Right Sinus: No maxillary sinus tenderness or frontal sinus tenderness.     Left Sinus: No maxillary sinus tenderness or frontal sinus tenderness.     Mouth/Throat:     Mouth: Mucous membranes are moist.     Pharynx: Oropharynx is clear. Uvula midline. No pharyngeal swelling, oropharyngeal exudate or posterior oropharyngeal erythema.  Eyes:     General: Lids are normal.        Right eye: No discharge.        Left eye: No discharge.     Extraocular Movements: Extraocular movements intact.     Conjunctiva/sclera: Conjunctivae normal.     Pupils: Pupils are equal, round, and reactive to light.     Visual Fields: Right eye visual fields normal and left eye visual fields normal.  Neck:     Thyroid: No thyromegaly.     Vascular: No carotid bruit.     Trachea: Trachea normal.  Cardiovascular:     Rate and Rhythm: Normal rate and regular rhythm.     Heart sounds: Normal heart sounds. No murmur heard.    No gallop.  Pulmonary:     Effort: Pulmonary effort is normal. No accessory muscle usage or respiratory distress.     Breath sounds: Normal breath sounds.  Chest:  Breasts:    Right: Normal.     Left: Normal.  Abdominal:     General: Bowel sounds are normal.     Palpations: Abdomen is soft. There is no hepatomegaly or  splenomegaly.     Tenderness: There is no abdominal tenderness.  Musculoskeletal:        General: Normal range of motion.     Cervical back: Normal range of motion  and neck supple.     Right lower leg: No edema.     Left lower leg: No edema.  Lymphadenopathy:     Head:     Right side of head: No submental, submandibular, tonsillar, preauricular or posterior auricular adenopathy.     Left side of head: No submental, submandibular, tonsillar, preauricular or posterior auricular adenopathy.     Cervical: No cervical adenopathy.     Upper Body:     Right upper body: No supraclavicular, axillary or pectoral adenopathy.     Left upper body: No supraclavicular, axillary or pectoral adenopathy.  Skin:    General: Skin is warm and dry.     Capillary Refill: Capillary refill takes less than 2 seconds.     Findings: No rash.  Neurological:     Mental Status: She is alert and oriented to person, place, and time.     Gait: Gait is intact.     Deep Tendon Reflexes: Reflexes are normal and symmetric.     Reflex Scores:      Brachioradialis reflexes are 2+ on the right side and 2+ on the left side.      Patellar reflexes are 2+ on the right side and 2+ on the left side. Psychiatric:        Attention and Perception: Attention normal.        Mood and Affect: Mood normal.        Speech: Speech normal.        Behavior: Behavior normal. Behavior is cooperative.        Thought Content: Thought content normal.        Judgment: Judgment normal.    Results for orders placed or performed in visit on 11/01/22  Pacemaker Device Observation  Result Value Ref Range   PACEART PDF     DEVICE MODEL PM     BATTERY VOLTAGE        Assessment & Plan:   Problem List Items Addressed This Visit       Cardiovascular and Mediastinum   AV BLOCK, 1ST DEGREE    Stable.  Followed by cardiology, continue collaboration.  Recent note reviewed.      Hypertension associated with diabetes    Chronic, stable.  BP  at goal at home and in office.  Continue current medication regimen and adjust as needed + continue collaboration with cardiology.  LABS: CMP, CBC, TSH, urine ALB.  Urine ALB 24 December 2022.  Recommend she continue to monitor BP at home and focus on DASH diet.  Return in 3 months.      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 Urine ALB 24 December 2022. Continue current medication regimen as A1c at goal and no hypoglycemia at home. If continued stable A1c trend may consider reduction of medication.  Continue to monitor BS at home daily.   Return in 3 months for visit.      Relevant Orders   Bayer DCA Hb A1c Waived   Microalbumin, Urine Waived   Hyperlipidemia associated with type 2 diabetes mellitus    Chronic, ongoing.  Continue current medication regimen and adjust as needed.  Lipid panel today.      Relevant Orders   Bayer DCA Hb A1c Waived  Comprehensive metabolic panel   Lipid Panel w/o Chol/HDL Ratio     Nervous and Auditory   Chemotherapy-induced neuropathy    Chronic, ongoing.  Continue Lyrica and adjust as needed, especially with aging monitor dose closely.  Refills up to date.  Return in 3 months.        Musculoskeletal and Integument   Trigger ring finger of right hand    Acute, referral to ortho for injections if needed.      Relevant Orders   Ambulatory referral to Orthopedic Surgery     Other   B12 deficiency    Chronic, ongoing.  Continue supplement at home and recheck labs today.      Relevant Orders   Vitamin B12   Elevated TSH    Noted on recent labs with downward trend to normal range, no symptoms.  Recheck today.  Initiate medication as needed.      Relevant Orders   TSH   T4, free   History of breast cancer    Continue collaboration  with oncology as needed.  Annual mammograms.      Relevant Orders   MM 3D SCREENING MAMMOGRAM BILATERAL BREAST   Hypomagnesemia    Ongoing and stable.  Recent level normal. Continue amiloride and follow up with nephrology as needed. Magnesium rechecked today.  Has had stable levels since being on Jardiance, almost 2 years of normal levels now.      Relevant Orders   Magnesium   Morbid obesity    BMI 37.56 with T2DM and HTN -- is losing weight, praised for this.  Recommended eating smaller high protein, low fat meals more frequently and exercising 30 mins a day 5 times a week with a goal of 10-15 lb weight loss in the next 3 months. Patient voiced their understanding and motivation to adhere to these recommendations.       PPM-[employer]    Continue collaboration with cardiology for checks.      Vitamin D deficiency    Chronic.  Noted past labs, continue supplement and recheck today.      Relevant Orders   VITAMIN D 25 Hydroxy (Vit-D Deficiency, Fractures)   Other Visit Diagnoses     Colon cancer screening       Cologuard ordered.   Relevant Orders   Cologuard   Encounter for annual physical exam       Annual physical today with labs and health maintenance reviewed, discussed with patient.        Follow up plan: Return in about 3 months (around 02/25/2023) for T2DM, HTN/HLD, MAG CHECK.   LABORATORY TESTING:  - Pap smear: not applicable  IMMUNIZATIONS:   - Tdap: Tetanus vaccination status reviewed: last tetanus booster within 10 years. - Influenza: Up to date - Pneumovax: Up to date - Prevnar: Up to date - COVID: Up to date - HPV: Not applicable - Shingrix vaccine: Up to date  SCREENING: -Mammogram: Ordered Today - Colonoscopy: Ordered Cologuard Today - Bone Density: Up to date  -Hearing Test: Not applicable  -Spirometry: Not applicable   PATIENT COUNSELING:   Advised to take 1 mg of folate supplement per day if capable of pregnancy.    Diet:  Encouraged to adjust caloric intake to maintain  or achieve ideal body weight, to reduce intake of dietary saturated fat and total fat, to limit sodium intake by avoiding high sodium foods and not adding table salt, and to maintain adequate dietary potassium and calcium preferably from fresh fruits,  vegetables, and low-fat dairy products.    Stressed the importance of regular exercise  Injury prevention: Discussed safety belts, safety helmets, smoke detector, smoking near bedding or upholstery.   Dental health: Discussed importance of regular tooth brushing, flossing, and dental visits.    NEXT PREVENTATIVE PHYSICAL DUE IN 1 YEAR. Return in about 3 months (around 02/25/2023) for T2DM, HTN/HLD, MAG CHECK.

## 2022-11-26 NOTE — Assessment & Plan Note (Signed)
Noted on recent labs with downward trend to normal range, no symptoms.  Recheck today.  Initiate medication as needed.

## 2022-11-26 NOTE — Assessment & Plan Note (Signed)
BMI 37.56 with T2DM and HTN -- is losing weight, praised for this.  Recommended eating smaller high protein, low fat meals more frequently and exercising 30 mins a day 5 times a week with a goal of 10-15 lb weight loss in the next 3 months. Patient voiced their understanding and motivation to adhere to these recommendations.

## 2022-11-26 NOTE — Assessment & Plan Note (Signed)
Chronic, ongoing.  Continue Lyrica and adjust as needed, especially with aging monitor dose closely.  Refills up to date.  Return in 3 months. 

## 2022-11-26 NOTE — Assessment & Plan Note (Signed)
Chronic, stable.  BP at goal at home and in office.  Continue current medication regimen and adjust as needed + continue collaboration with cardiology.  LABS: CMP, CBC, TSH, urine ALB.  Urine ALB 24 December 2022.  Recommend she continue to monitor BP at home and focus on DASH diet.  Return in 3 months.

## 2022-11-26 NOTE — Assessment & Plan Note (Signed)
Acute, referral to ortho for injections if needed.

## 2022-11-26 NOTE — Assessment & Plan Note (Signed)
Chronic, ongoing.  Continue current medication regimen and adjust as needed. Lipid panel today. 

## 2022-11-26 NOTE — Assessment & Plan Note (Signed)
Chronic, ongoing with A1c 6.5% today. Urine ALB 24 December 2022. Continue current medication regimen as A1c at goal and no hypoglycemia at home. If continued stable A1c trend may consider reduction of medication.  Continue to monitor BS at home daily.   Return in 3 months for visit.

## 2022-11-26 NOTE — Assessment & Plan Note (Signed)
Continue collaboration with cardiology for checks. °

## 2022-11-26 NOTE — Assessment & Plan Note (Signed)
Continue collaboration with oncology as needed.  Annual mammograms. 

## 2022-11-26 NOTE — Assessment & Plan Note (Signed)
Ongoing and stable.  Recent level normal. Continue amiloride and follow up with nephrology as needed. Magnesium rechecked today.  Has had stable levels since being on Jardiance, almost 2 years of normal levels now.

## 2022-11-26 NOTE — Assessment & Plan Note (Signed)
Stable.  Followed by cardiology, continue collaboration.  Recent note reviewed. 

## 2022-11-26 NOTE — Assessment & Plan Note (Signed)
Chronic, ongoing.  Continue supplement at home and recheck labs today. 

## 2022-11-27 LAB — VITAMIN B12: Vitamin B-12: 989 pg/mL (ref 232–1245)

## 2022-11-27 LAB — CBC WITH DIFFERENTIAL/PLATELET
Basophils Absolute: 0.1 10*3/uL (ref 0.0–0.2)
Basos: 1 %
EOS (ABSOLUTE): 0.2 10*3/uL (ref 0.0–0.4)
Eos: 4 %
Hematocrit: 41.8 % (ref 34.0–46.6)
Hemoglobin: 13.7 g/dL (ref 11.1–15.9)
Immature Grans (Abs): 0 10*3/uL (ref 0.0–0.1)
Immature Granulocytes: 0 %
Lymphocytes Absolute: 1.4 10*3/uL (ref 0.7–3.1)
Lymphs: 29 %
MCH: 28 pg (ref 26.6–33.0)
MCHC: 32.8 g/dL (ref 31.5–35.7)
MCV: 86 fL (ref 79–97)
Monocytes Absolute: 0.4 10*3/uL (ref 0.1–0.9)
Monocytes: 8 %
Neutrophils Absolute: 2.7 10*3/uL (ref 1.4–7.0)
Neutrophils: 58 %
Platelets: 178 10*3/uL (ref 150–450)
RBC: 4.89 x10E6/uL (ref 3.77–5.28)
RDW: 13.8 % (ref 11.7–15.4)
WBC: 4.7 10*3/uL (ref 3.4–10.8)

## 2022-11-27 LAB — COMPREHENSIVE METABOLIC PANEL
ALT: 23 IU/L (ref 0–32)
AST: 23 IU/L (ref 0–40)
Albumin/Globulin Ratio: 2.3 — ABNORMAL HIGH (ref 1.2–2.2)
Albumin: 4.5 g/dL (ref 3.8–4.8)
Alkaline Phosphatase: 112 IU/L (ref 44–121)
BUN/Creatinine Ratio: 21 (ref 12–28)
BUN: 21 mg/dL (ref 8–27)
Bilirubin Total: 0.5 mg/dL (ref 0.0–1.2)
CO2: 24 mmol/L (ref 20–29)
Calcium: 9.7 mg/dL (ref 8.7–10.3)
Chloride: 105 mmol/L (ref 96–106)
Creatinine, Ser: 1.01 mg/dL — ABNORMAL HIGH (ref 0.57–1.00)
Globulin, Total: 2 g/dL (ref 1.5–4.5)
Glucose: 119 mg/dL — ABNORMAL HIGH (ref 70–99)
Potassium: 4 mmol/L (ref 3.5–5.2)
Sodium: 144 mmol/L (ref 134–144)
Total Protein: 6.5 g/dL (ref 6.0–8.5)
eGFR: 59 mL/min/{1.73_m2} — ABNORMAL LOW (ref >59)

## 2022-11-27 LAB — T4, FREE: Free T4: 1.08 ng/dL (ref 0.82–1.77)

## 2022-11-27 LAB — MAGNESIUM: Magnesium: 1.7 mg/dL (ref 1.6–2.3)

## 2022-11-27 LAB — LIPID PANEL W/O CHOL/HDL RATIO
Cholesterol, Total: 140 mg/dL (ref 100–199)
HDL: 47 mg/dL (ref >39)
LDL Chol Calc (NIH): 68 mg/dL (ref 0–99)
Triglycerides: 142 mg/dL (ref 0–149)
VLDL Cholesterol Cal: 25 mg/dL (ref 5–40)

## 2022-11-27 LAB — VITAMIN D 25 HYDROXY (VIT D DEFICIENCY, FRACTURES): Vit D, 25-Hydroxy: 65.8 ng/mL (ref 30.0–100.0)

## 2022-11-27 LAB — TSH: TSH: 2.58 u[IU]/mL (ref 0.450–4.500)

## 2022-11-27 NOTE — Progress Notes (Signed)
Contacted via MyChart   Good morning [name], your labs have returned: - Kidney function, creatinine and eGFR, continues to show mild chronic kidney disease stage 3a with no worsening.  Great news.  We will continue to monitor at visits and continue all current medications + good hydration.  Liver function, AST and ALT, are normal. - CBC shows no anemia or infection. - Cholesterol levels are at goal. - Thyroid labs remain normal this check, have been in normal range for almost 2 years now.  Great news. - Magnesium remains in normal range!!!! Overall great labs, no medication changes needed.  Bravo!! Keep being amazing!!  Thank you for allowing me to participate in your care.  I appreciate you. Kindest regards, Yariel Ferraris

## 2022-12-02 ENCOUNTER — Telehealth: Payer: Self-pay | Admitting: Nurse Practitioner

## 2022-12-02 NOTE — Telephone Encounter (Signed)
Contacted [name] to schedule their annual wellness visit. Appointment made for 12/17/2022.  [name]; Care Guide Ambulatory Clinical Support [place] [facility] Direct Dial: [phone]

## 2022-12-05 ENCOUNTER — Other Ambulatory Visit: Payer: Self-pay | Admitting: Nurse Practitioner

## 2022-12-05 DIAGNOSIS — E1159 Type 2 diabetes mellitus with other circulatory complications: Secondary | ICD-10-CM

## 2022-12-05 NOTE — Telephone Encounter (Signed)
Requested Prescriptions  Pending Prescriptions Disp Refills   aMILoride (MIDAMOR) 5 MG tablet [Pharmacy Med Name: AMILORIDE HCL 5 MG TABLET] 90 tablet 0    Sig: Take 1 tablet (5 mg total) by mouth daily.     Cardiovascular:  Diuretics - Potassium Sparing Failed - 12/05/2022  7:29 AM      Failed - Cr in normal range and within 180 days    Creatinine, Ser  Date Value Ref Range Status  11/26/2022 1.01 (H) 0.57 - 1.00 mg/dL Final         Passed - K in normal range and within 180 days    Potassium  Date Value Ref Range Status  11/26/2022 4.0 3.5 - 5.2 mmol/L Final         Passed - Na in normal range and within 180 days    Sodium  Date Value Ref Range Status  11/26/2022 144 134 - 144 mmol/L Final         Passed - eGFR is 10 or above and within 180 days    GFR calc Af Amer  Date Value Ref Range Status  10/04/2020 70 >59 mL/min/1.73 Final    Comment:    **In accordance with recommendations from the NKF-ASN Task force,**   [facility] is in the process of updating its eGFR calculation to the   2021 CKD-EPI creatinine equation that estimates kidney function   without a race variable.    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)   [place] [facility] [place], Corrie Dandy T, NP   3 months ago Medicare annual wellness visit, subsequent   [place] [facility] [place], [place] T, NP   6 months ago 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)   [place] [facility] [place],  [name], NP   9 months ago 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)   [place] [facility] [place], Corrie Dandy T, NP   3 months ago Medicare annual wellness visit, subsequent   [place] [facility] [place], [place] T, NP   6 months ago 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

## 2022-12-06 ENCOUNTER — Other Ambulatory Visit: Payer: Self-pay | Admitting: Nurse Practitioner

## 2022-12-06 NOTE — Telephone Encounter (Signed)
Requested Prescriptions  Refused Prescriptions Disp Refills   montelukast (SINGULAIR) 10 MG tablet [Pharmacy Med Name: MONTELUKAST SOD 10 MG TABLET] 90 tablet 0    Sig: Take 1 tablet (10 mg total) by mouth at bedtime.     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

## 2022-12-09 ENCOUNTER — Other Ambulatory Visit: Payer: Self-pay | Admitting: Nurse Practitioner

## 2022-12-10 NOTE — Telephone Encounter (Signed)
Requested Prescriptions  Refused Prescriptions Disp Refills   montelukast (SINGULAIR) 10 MG tablet [Pharmacy Med Name: MONTELUKAST SOD 10 MG TABLET] 90 tablet 0    Sig: Take 1 tablet (10 mg total) by mouth at bedtime.     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

## 2022-12-12 LAB — COLOGUARD: COLOGUARD: POSITIVE — AB

## 2022-12-13 ENCOUNTER — Other Ambulatory Visit: Payer: Self-pay | Admitting: Nurse Practitioner

## 2022-12-13 DIAGNOSIS — R195 Other fecal abnormalities: Secondary | ICD-10-CM

## 2022-12-13 NOTE — Progress Notes (Signed)
Contacted via MyChart   Good morning [name], your Cologuard returned and is positive this round.:( This means I will need to place a referral to GI for colonoscopy.  I will place this referral today.  Any questions? Keep being stellar!!  Thank you for allowing me to participate in your care.  I appreciate you. Kindest regards, Jeanpaul Biehl

## 2022-12-13 NOTE — Telephone Encounter (Signed)
Unable to refill per protocol, Rx request is too soon. Last refill 11/13/21 for 90 and 4 refills.  Requested Prescriptions  Pending Prescriptions Disp Refills   montelukast (SINGULAIR) 10 MG tablet [Pharmacy Med Name: MONTELUKAST SOD 10 MG TABLET] 90 tablet 0    Sig: Take 1 tablet (10 mg total) by mouth at bedtime.     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

## 2022-12-17 ENCOUNTER — Encounter: Payer: Self-pay | Admitting: Oncology

## 2022-12-17 ENCOUNTER — Telehealth: Payer: Self-pay

## 2022-12-17 ENCOUNTER — Other Ambulatory Visit: Payer: Self-pay

## 2022-12-17 DIAGNOSIS — Z1211 Encounter for screening for malignant neoplasm of colon: Secondary | ICD-10-CM

## 2022-12-17 DIAGNOSIS — R195 Other fecal abnormalities: Secondary | ICD-10-CM

## 2022-12-17 MED ORDER — PEG 3350-KCL-NA BICARB-NACL 420 G PO SOLR: 4000.0000 mL | Freq: Once | ORAL | 0 refills | Status: AC

## 2022-12-17 NOTE — Telephone Encounter (Signed)
Gastroenterology Pre-Procedure Review  Request Date: 01/13/23 Requesting Physician: Dr. Tobi Bastos  PATIENT REVIEW QUESTIONS: The patient responded to the following health history questions as indicated:    1. Are you having any GI issues? no 2. Do you have a personal history of Polyps? no 3. Do you have a family history of Colon Cancer or Polyps? no 4. Diabetes Mellitus? yes (patient has been advised to stop Jardiance 3 days prior to colonoscopy) 5. Joint replacements in the past 12 months?no 6. Major health problems in the past 3 months?no 7. Any artificial heart valves, MVP, or defibrillator?yes patient has pacemaker cardiac clearance sent to Dr. Sherryl Manges    MEDICATIONS & ALLERGIES:    Patient reports the following regarding taking any anticoagulation/antiplatelet therapy:   Plavix, Coumadin, Eliquis, Xarelto, Lovenox, Pradaxa, Brilinta, or Effient? no Aspirin? no  Patient confirms/reports the following medications:  Current Outpatient Medications  Medication Sig Dispense Refill   albuterol (VENTOLIN HFA) 108 (90 Base) MCG/ACT inhaler Inhale 2 puffs into the lungs every 6 (six) hours as needed for wheezing or shortness of breath. 8 g 0   aMILoride (MIDAMOR) 5 MG tablet Take 1 tablet (5 mg total) by mouth daily. 90 tablet 0   aspirin-acetaminophen-caffeine (EXCEDRIN MIGRAINE) 250-250-65 MG tablet Take 2 tablets by mouth every 6 (six) hours as needed for headache or migraine.     atenolol (TENORMIN) 50 MG tablet Take 1 tablet (50 mg total) by mouth daily. 90 tablet 0   atorvastatin (LIPITOR) 40 MG tablet Take 1 tablet (40 mg total) by mouth daily. 90 tablet 4   Cholecalciferol (VITAMIN D3) 50 MCG (2000 UT) TABS Take 1 tablet by mouth daily.     CINNAMON PO Take 3 capsules by mouth in the morning.     CRANBERRY-VITAMIN C PO Take 1 tablet by mouth daily.     Cyanocobalamin (VITAMIN B-12 PO) Take 1 tablet by mouth every other day.     empagliflozin (JARDIANCE) 25 MG TABS tablet Take 1  tablet (25 mg total) by mouth daily before breakfast. 90 tablet 4   esomeprazole (NEXIUM) 20 MG capsule Take 20 mg by mouth every morning.     fluticasone (FLONASE) 50 MCG/ACT nasal spray Place 2 sprays into both nostrils daily. 48 g 0   glucose blood (ACCU-CHEK AVIVA PLUS) test strip TEST BLOOD SUGAR ONCE DAILY 100 each 12   levocetirizine (XYZAL) 5 MG tablet Take 5 mg by mouth every evening.     MAGNESIUM CHLORIDE-CALCIUM PO Take 3 tablets by mouth daily.     magnesium oxide (MAG-OX) 400 MG tablet Take 1,200 mg by mouth daily.     melatonin 3 MG TABS tablet Take 3 mg by mouth at bedtime.     montelukast (SINGULAIR) 10 MG tablet Take 1 tablet (10 mg total) by mouth at bedtime. 90 tablet 4   nystatin (MYCOSTATIN/NYSTOP) powder Apply 1 Application topically 2 (two) times daily. 15 g 4   pregabalin (LYRICA) 50 MG capsule Take 1 capsule (50 mg total) by mouth 3 (three) times daily. 270 capsule 2   SUMAtriptan (IMITREX) 100 MG tablet Take 1 tablet (100 mg total) by mouth as needed. 10 tablet 12   tiZANidine (ZANAFLEX) 2 MG tablet Take 1 tablet (2 mg total) by mouth every 8 (eight) hours as needed for muscle spasms. 30 tablet 0   No current facility-administered medications for this visit.    Patient confirms/reports the following allergies:  Allergies  Allergen Reactions   Penicillins Swelling  Causes organs to swell Other reaction(s): Other (See Comments) Organs flipped over per patient; skin starting splitting open and blood vessels burst in legs;  Causes organs to swell   Levofloxacin Swelling    Leg swelling and muscle weakness Other reaction(s): Other (See Comments) Tendon pain and weakness in muscles Leg swelling and muscle weakness   Other Itching    bandaids    No orders of the defined types were placed in this encounter.   AUTHORIZATION INFORMATION Primary Insurance: 1D#: Group #:  Secondary Insurance: 1D#: Group #:  SCHEDULE INFORMATION: Date:  01/13/23 Time: Location: [facility]

## 2022-12-31 DIAGNOSIS — M65341 Trigger finger, right ring finger: Secondary | ICD-10-CM | POA: Diagnosis not present

## 2023-01-02 DIAGNOSIS — M9903 Segmental and somatic dysfunction of lumbar region: Secondary | ICD-10-CM | POA: Diagnosis not present

## 2023-01-02 DIAGNOSIS — M9901 Segmental and somatic dysfunction of cervical region: Secondary | ICD-10-CM | POA: Diagnosis not present

## 2023-01-02 DIAGNOSIS — M62830 Muscle spasm of back: Secondary | ICD-10-CM | POA: Diagnosis not present

## 2023-01-02 DIAGNOSIS — M5033 Other cervical disc degeneration, cervicothoracic region: Secondary | ICD-10-CM | POA: Diagnosis not present

## 2023-01-10 ENCOUNTER — Telehealth: Payer: Self-pay

## 2023-01-10 ENCOUNTER — Telehealth: Payer: Self-pay | Admitting: Internal Medicine

## 2023-01-10 NOTE — Telephone Encounter (Signed)
Patient is calling to follow up on clearance. I explained to the patient that we unfortunately are unable to give clearance on short notice. Patient is requesting a call back.

## 2023-01-10 NOTE — Telephone Encounter (Signed)
   Pre-operative Risk Assessment    Patient Name: [name]  DOB: 06-Nov-1950 MRN: [id number]      Request for Surgical Clearance    Procedure:   Colonoscopy  Date of Surgery:  Clearance 01/13/23                                 Surgeon:  Wyline Mood, MD Surgeon's Group or Practice Name:  [facility] Phone number:  [phone] Fax number:  [phone]   Type of Clearance Requested:   - Medical    Type of Anesthesia:  General    Additional requests/questions:   Office is closed for the day so she is requesting the patient be contacted regarding this and the patient's chart will be checked on Monday on if she is cleared or not. Please advise.   Minna Antis   01/10/2023, 12:48 PM

## 2023-01-10 NOTE — Telephone Encounter (Signed)
Patient has been informed that we have not received her cardiac clearance to have her colonoscopy currently scheduled with Dr. Tobi Bastos at [facility]  on Monday 01/13/23 general anesthesia propofol.  I've contacted [facility] to ask them to contact patient directly in regards to her clearance since I will be leaving the office shortly and she will be prepping this weekend.  [facility]oscopy Dept has been made aware that patients cardiac clearance is pending.  Thanks, [place], [place]

## 2023-01-10 NOTE — Telephone Encounter (Signed)
Please notify office that we will not be able to accommodate non-urgent procedural requests with this late notice.  We are happy to schedule her in the future.  Levi Aland, NP-C  01/10/2023, 1:24 PM [address], [address] Office [phone] Fax [phone]

## 2023-01-13 ENCOUNTER — Ambulatory Visit: Payer: Medicare PPO | Admitting: Certified Registered"

## 2023-01-13 ENCOUNTER — Encounter
Admission: RE | Disposition: A | Discharge status: Home / Self Care | Payer: Self-pay | Source: Ambulatory Visit | Attending: Gastroenterology

## 2023-01-13 ENCOUNTER — Ambulatory Visit
Admission: RE | Admit: 2023-01-13 | Discharge: 2023-01-13 | Disposition: A | Discharge status: Home / Self Care | Payer: Medicare PPO | Source: Ambulatory Visit | Attending: Gastroenterology | Admitting: Gastroenterology

## 2023-01-13 ENCOUNTER — Encounter: Payer: Self-pay | Admitting: Gastroenterology

## 2023-01-13 ENCOUNTER — Other Ambulatory Visit: Payer: Self-pay

## 2023-01-13 DIAGNOSIS — E039 Hypothyroidism, unspecified: Secondary | ICD-10-CM | POA: Insufficient documentation

## 2023-01-13 DIAGNOSIS — Z1211 Encounter for screening for malignant neoplasm of colon: Secondary | ICD-10-CM | POA: Diagnosis not present

## 2023-01-13 DIAGNOSIS — D122 Benign neoplasm of ascending colon: Secondary | ICD-10-CM | POA: Diagnosis not present

## 2023-01-13 DIAGNOSIS — R195 Other fecal abnormalities: Secondary | ICD-10-CM

## 2023-01-13 DIAGNOSIS — E1140 Type 2 diabetes mellitus with diabetic neuropathy, unspecified: Secondary | ICD-10-CM | POA: Insufficient documentation

## 2023-01-13 DIAGNOSIS — Z87891 Personal history of nicotine dependence: Secondary | ICD-10-CM | POA: Insufficient documentation

## 2023-01-13 DIAGNOSIS — Z79899 Other long term (current) drug therapy: Secondary | ICD-10-CM | POA: Diagnosis not present

## 2023-01-13 DIAGNOSIS — Z950 Presence of cardiac pacemaker: Secondary | ICD-10-CM | POA: Insufficient documentation

## 2023-01-13 DIAGNOSIS — D126 Benign neoplasm of colon, unspecified: Secondary | ICD-10-CM | POA: Diagnosis not present

## 2023-01-13 DIAGNOSIS — K5730 Diverticulosis of large intestine without perforation or abscess without bleeding: Secondary | ICD-10-CM | POA: Diagnosis not present

## 2023-01-13 DIAGNOSIS — Z79890 Hormone replacement therapy (postmenopausal): Secondary | ICD-10-CM | POA: Diagnosis not present

## 2023-01-13 DIAGNOSIS — Z7984 Long term (current) use of oral hypoglycemic drugs: Secondary | ICD-10-CM | POA: Insufficient documentation

## 2023-01-13 DIAGNOSIS — I10 Essential (primary) hypertension: Secondary | ICD-10-CM | POA: Diagnosis not present

## 2023-01-13 DIAGNOSIS — D124 Benign neoplasm of descending colon: Secondary | ICD-10-CM | POA: Diagnosis not present

## 2023-01-13 DIAGNOSIS — K640 First degree hemorrhoids: Secondary | ICD-10-CM | POA: Insufficient documentation

## 2023-01-13 DIAGNOSIS — K635 Polyp of colon: Secondary | ICD-10-CM | POA: Diagnosis not present

## 2023-01-13 DIAGNOSIS — K649 Unspecified hemorrhoids: Secondary | ICD-10-CM | POA: Diagnosis not present

## 2023-01-13 DIAGNOSIS — K219 Gastro-esophageal reflux disease without esophagitis: Secondary | ICD-10-CM | POA: Diagnosis not present

## 2023-01-13 HISTORY — PX: COLONOSCOPY WITH PROPOFOL: SHX5780

## 2023-01-13 LAB — GLUCOSE, CAPILLARY: Glucose-Capillary: 101 mg/dL — ABNORMAL HIGH (ref 70–99)

## 2023-01-13 SURGERY — COLONOSCOPY WITH PROPOFOL
Anesthesia: General

## 2023-01-13 MED ORDER — LIDOCAINE HCL (PF) 1 % IJ SOLN
INTRAMUSCULAR | Status: AC
  Filled 2023-01-13: qty 2

## 2023-01-13 MED ORDER — PROPOFOL 500 MG/50ML IV EMUL
INTRAVENOUS | Status: DC | PRN
  Administered 2023-01-13: 120 ug/kg/min via INTRAVENOUS

## 2023-01-13 MED ORDER — LIDOCAINE 2% (20 MG/ML) 5 ML SYRINGE
INTRAMUSCULAR | Status: DC | PRN
  Administered 2023-01-13: 20 mg via INTRAVENOUS

## 2023-01-13 MED ORDER — SODIUM CHLORIDE 0.9 % IV SOLN: INTRAVENOUS | Status: DC

## 2023-01-13 MED ORDER — PROPOFOL 10 MG/ML IV BOLUS
INTRAVENOUS | Status: DC | PRN
  Administered 2023-01-13: 70 mg via INTRAVENOUS
  Administered 2023-01-13: 30 mg via INTRAVENOUS

## 2023-01-13 NOTE — Transfer of Care (Signed)
Immediate Anesthesia Transfer of Care Note  Patient: [name]  Procedure(s) Performed: COLONOSCOPY WITH PROPOFOL  Patient Location: Endoscopy Unit  Anesthesia Type:General  Level of Consciousness: awake, alert , and oriented  Airway & Oxygen Therapy: Patient Spontanous Breathing  Post-op Assessment: Report given to RN and Post -op Vital signs reviewed and stable  Post vital signs: Reviewed  Last Vitals:  Vitals Value Taken Time  BP 109/49 01/13/23 0945  Temp 35.6 C 01/13/23 0942  Pulse 76 01/13/23 0946  Resp 14 01/13/23 0946  SpO2 100 % 01/13/23 0946  Vitals shown include unvalidated device data.  Last Pain:  Vitals:   01/13/23 0942  TempSrc: Temporal  PainSc: 0-No pain         Complications: No notable events documented.

## 2023-01-13 NOTE — Anesthesia Preprocedure Evaluation (Signed)
Anesthesia Evaluation  Patient identified by MRN, date of birth, ID band Patient awake    Reviewed: Allergy & Precautions, H&P , NPO status , Patient's Chart, lab work & pertinent test results, reviewed documented beta blocker date and time   Airway Mallampati: II   Neck ROM: full    Dental  (+) Poor Dentition   Pulmonary neg pulmonary ROS, former smoker   Pulmonary exam normal        Cardiovascular Exercise Tolerance: Poor hypertension, On Medications + dysrhythmias + pacemaker  Rhythm:regular Rate:Normal     Neuro/Psych  Headaches  Neuromuscular disease  negative psych ROS   GI/Hepatic Neg liver ROS,GERD  Medicated,,  Endo/Other  diabetes, Well ControlledHypothyroidism    Renal/GU negative Renal ROS  negative genitourinary   Musculoskeletal   Abdominal   Peds  Hematology negative hematology ROS (+)   Anesthesia Other Findings Past Medical History: No date: Arthritis 09/2007: Breast cancer, left (HCC)     Comment:  s/p lumpectomy and chemoradiation 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 No date: GERD (gastroesophageal reflux disease) No date: Glaucoma No date: History of first degree AV block     Comment:  high grade; PPM placed No date: History of kidney stones 09/21/2013: Hypersomnolence No date: Hypertension No date: Hypothyroidism     Comment:  no meds currently 09/21/2013: Leg edema, left 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 No date: Migraines No date: Neuropathy     Comment:  hands from chemo 11/06/2020: Ovarian cyst 2004: Pacemaker [employer] 2009: Personal history of chemotherapy     Comment:  left breast ca 2009: Personal history of radiation therapy     Comment:  left breast No date: Sinus tachycardia 06/19/2015: Type 2 diabetes mellitus (HCC) Past Surgical History: No date: ANKLE SURGERY; Left     Comment:  x 3 2009: BREAST EXCISIONAL BIOPSY; Left      Comment:   Stage IIa, ER/PR negative, 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, 2009: BREAST LUMPECTOMY; Left     Comment:  chemo and rad tx No date: CARPAL TUNNEL RELEASE 1983: CESAREAN SECTION No date: CHOLECYSTECTOMY 12/2018: EYE SURGERY; Left     Comment:  cataracts-Dr[facility]Shah in [facility]  01/2019: EYE SURGERY; Right     Comment:  cataracts- Dr.Shah in [place] 1993: GALLBLADDER SURGERY 2011: INSERT / REPLACE / REMOVE PACEMAKER     Comment:  revision @ [facility] 05/13/2003: PACEMAKER INSERTION     Comment:  Neoma Laming - DR 01/18/2021: ROBOTIC ASSISTED SALPINGO OOPHERECTOMY; Right     Comment:  Procedure: XI ROBOTIC ASSISTED RIGHT               SALPINGO-OOPHORECTOMY;  Surgeon: Natale Milch,               MD;  Location: [facility] ORS;  Service: Gynecology;                Laterality: Right;   Reproductive/Obstetrics negative OB ROS                             Anesthesia Physical Anesthesia Plan  ASA: 3  Anesthesia Plan: General   Post-op Pain Management:    Induction:   PONV Risk Score and Plan:   Airway Management Planned:   Additional Equipment:   Intra-op Plan:   Post-operative Plan:  Informed Consent: I have reviewed the patients History and Physical, chart, labs and discussed the procedure including the risks, benefits and alternatives for the proposed anesthesia with the patient or authorized representative who has indicated his/her understanding and acceptance.     Dental Advisory Given  Plan Discussed with: CRNA  Anesthesia Plan Comments:        Anesthesia Quick Evaluation

## 2023-01-13 NOTE — Op Note (Signed)
[facility] Gastroenterology Patient Name: [name] Procedure Date: 01/13/2023 9:02 AM MRN: [id number] Account #: [id number] Date of Birth: 07-06-1951 Admit Type: Outpatient Age: 72 Room: [facility] ENDO ROOM 1 Gender: Female Note Status: Finalized Instrument Name: Prentice Docker [id number] Procedure:             Colonoscopy Indications:           Screening for colorectal malignant neoplasm due to                         positive Cologuard test Providers:             Wyline Mood MD, MD Referring MD:          Dorie Rank. Harvest Dark (Referring MD) Medicines:             Monitored Anesthesia Care Complications:         No immediate complications. Procedure:             Pre-Anesthesia Assessment:                        - Prior to the procedure, a History and Physical was                         performed, and patient medications, allergies and                         sensitivities were reviewed. The patient's tolerance                         of previous anesthesia was reviewed.                        - The risks and benefits of the procedure and the                         sedation options and risks were discussed with the                         patient. All questions were answered and informed                         consent was obtained.                        - ASA Grade Assessment: II - A patient with mild                         systemic disease.                        After obtaining informed consent, the colonoscope was                         passed under direct vision. Throughout the procedure,                         the patient's blood pressure, pulse, and oxygen                         saturations were monitored  continuously. The                         Colonoscope was introduced through the anus and                         advanced to the the cecum, identified by the                         appendiceal orifice. The colonoscopy was performed                         with  ease. The patient tolerated the procedure well.                         The quality of the bowel preparation was excellent.                         The ileocecal valve, appendiceal orifice, and rectum                         were photographed. Findings:      The perianal and digital rectal examinations were normal.      Two sessile polyps were found in the ascending colon. The polyps were 3       to 4 mm in size. These polyps were removed with a jumbo cold forceps.       Resection and retrieval were complete.      Two sessile polyps were found in the descending colon. The polyps were 5       to 7 mm in size. These polyps were removed with a cold snare. Resection       and retrieval were complete.      Multiple medium-mouthed diverticula were found in the sigmoid colon.      Non-bleeding hemorrhoids were found during retroflexion. The hemorrhoids       were medium-sized and Grade I (internal hemorrhoids that do not       prolapse).      The exam was otherwise without abnormality on direct and retroflexion       views. Impression:            - Two 3 to 4 mm polyps in the ascending colon, removed                         with a jumbo cold forceps. Resected and retrieved.                        - Two 5 to 7 mm polyps in the descending colon,                         removed with a cold snare. Resected and retrieved.                        - Diverticulosis in the sigmoid colon.                        - Non-bleeding hemorrhoids.                        -  The examination was otherwise normal on direct and                         retroflexion views. Recommendation:        - Discharge patient to home (with escort).                        - Resume previous diet.                        - Continue present medications.                        - Await pathology results.                        - Repeat colonoscopy for surveillance based on                         pathology results. Procedure Code(s):      --- Professional ---                        [id number], Colonoscopy, flexible; with removal of                         tumor(s), polyp(s), or other lesion(s) by snare                         technique                        45380, 59, Colonoscopy, flexible; with biopsy, single                         or multiple Diagnosis Code(s):     --- Professional ---                        Z12.11, Encounter for screening for malignant neoplasm                         of colon                        R19.5, Other fecal abnormalities                        D12.2, Benign neoplasm of ascending colon                        D12.4, Benign neoplasm of descending colon                        K64.0, First degree hemorrhoids                        K57.30, Diverticulosis of large intestine without                         perforation or abscess without bleeding CPT copyright 2022 American Medical Association. All rights reserved. The codes documented in this report are preliminary and upon coder review may  be revised to meet current  compliance requirements. Wyline Mood, MD Wyline Mood MD, MD 01/13/2023 9:42:12 AM This report has been signed electronically. Number of Addenda: 0 Note Initiated On: 01/13/2023 9:02 AM Scope Withdrawal Time: 0 hours 12 minutes 44 seconds  Total Procedure Duration: 0 hours 14 minutes 54 seconds  Estimated Blood Loss:  Estimated blood loss: none.      A Rosie Place

## 2023-01-13 NOTE — H&P (Signed)
Wyline Mood, MD [address], [place], [place], [address] [address], [address], [place], [place], [address] Phone: [phone]  Fax: [phone]  Primary Care Physician:  Marjie Skiff, NP   Pre-Procedure History & Physical: HPI:  [name] is a 72 y.o. female is here for an colonoscopy.   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   Ovarian cyst 11/06/2020   Pacemaker [employer] 2004   Personal history of chemotherapy 2009   left breast ca   Personal history of radiation therapy 2009   left breast   Sinus tachycardia    Type 2 diabetes mellitus (HCC) 06/19/2015    Past Surgical History:  Procedure Laterality Date   ANKLE SURGERY Left    x 3   BREAST EXCISIONAL BIOPSY Left 2009    Stage IIa, ER/PR negative, 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,   BREAST LUMPECTOMY Left 2009   chemo and rad tx   CARPAL TUNNEL RELEASE     CESAREAN SECTION  1983   CHOLECYSTECTOMY     EYE SURGERY Left 12/2018   cataracts-Dr.Shah in [place]    EYE SURGERY Right 01/2019   cataracts- Dr[facility]Shah in [facility]   GALLBLADDER SURGERY  1993   INSERT / REPLACE / REMOVE PACEMAKER  2011   revision @ [facility]   PACEMAKER INSERTION  05/13/2003   [facility] Carollee Massed - DR   ROBOTIC ASSISTED SALPINGO OOPHERECTOMY Right 01/18/2021   Procedure: XI ROBOTIC ASSISTED RIGHT SALPINGO-OOPHORECTOMY;  Surgeon: Natale Milch, MD;  Location: [facility] ORS;  Service: Gynecology;  Laterality:  Right;    Prior to Admission medications   Medication Sig Start Date End Date Taking? Authorizing Provider  albuterol (VENTOLIN HFA) 108 (90 Base) MCG/ACT inhaler Inhale 2 puffs into the lungs every 6 (six) hours as needed for wheezing or shortness of breath. 02/28/22   Viviano Simas, FNP  aMILoride (MIDAMOR) 5 MG tablet Take 1 tablet (5 mg total) by mouth daily. 12/05/22   Marjie Skiff, NP  aspirin-acetaminophen-caffeine (EXCEDRIN MIGRAINE) [phone] MG tablet Take 2 tablets by mouth every 6 (six) hours as needed for headache or migraine.    [provider]  atenolol (TENORMIN) 50 MG tablet Take 1 tablet (50 mg total) by mouth daily. 12/05/22   [name], Corrie Dandy T, NP  atorvastatin (LIPITOR) 40 MG tablet Take 1 tablet (40 mg total) by mouth daily. 11/13/21   [name], Corrie Dandy T, NP  Cholecalciferol (VITAMIN D3) 50 MCG (2000 UT) TABS Take 1 tablet by mouth daily.    [provider]  CINNAMON PO Take 3 capsules by mouth in the morning.    [provider]  CRANBERRY-VITAMIN C PO Take 1 tablet by mouth daily.    [provider]  Cyanocobalamin (VITAMIN B-12 PO) Take 1 tablet by mouth every other day.    [provider]  empagliflozin (JARDIANCE) 25  MG TABS tablet Take 1 tablet (25 mg total) by mouth daily before breakfast. 02/13/22   [name], Corrie Dandy T, NP  esomeprazole (NEXIUM) 20 MG capsule Take 20 mg by mouth every morning.    [provider]  fluticasone (FLONASE) 50 MCG/ACT nasal spray Place 2 sprays into both nostrils daily. 11/19/22   [name], Corrie Dandy T, NP  glucose blood (ACCU-CHEK AVIVA PLUS) test strip TEST BLOOD SUGAR ONCE DAILY 09/21/18   Steele Sizer, MD  levocetirizine (XYZAL) 5 MG tablet Take 5 mg by mouth every evening.    [provider]  MAGNESIUM CHLORIDE-CALCIUM PO Take 3 tablets by mouth daily.    [provider]  magnesium oxide (MAG-OX) 400 MG tablet Take 1,200 mg by mouth daily.    [provider]   melatonin 3 MG TABS tablet Take 3 mg by mouth at bedtime.    [provider]  montelukast (SINGULAIR) 10 MG tablet Take 1 tablet (10 mg total) by mouth at bedtime. 11/13/21   [name], Corrie Dandy T, NP  nystatin (MYCOSTATIN/NYSTOP) powder Apply 1 Application topically 2 (two) times daily. 02/13/22   [name], Corrie Dandy T, NP  pregabalin (LYRICA) 50 MG capsule Take 1 capsule (50 mg total) by mouth 3 (three) times daily. 11/07/22   [name], Corrie Dandy T, NP  SUMAtriptan (IMITREX) 100 MG tablet Take 1 tablet (100 mg total) by mouth as needed. 03/16/18   Steele Sizer, MD  tiZANidine (ZANAFLEX) 2 MG tablet Take 1 tablet (2 mg total) by mouth every 8 (eight) hours as needed for muscle spasms. 08/16/22   Aura Dials T, NP    Allergies as of 12/17/2022 - Review Complete 12/17/2022  Allergen Reaction Noted   Penicillins Swelling 09/25/2009   Levofloxacin Swelling 09/20/2014   Other Itching 01/10/2021    Family History  Problem Relation Age of Onset   Diabetes Mother    Heart disease Mother    Thyroid disease Mother    Hypertension Father    Cancer Father    Alcohol abuse Father    Diabetes Father    Stroke Other        Family hx of CVA or stroke   Diabetes Brother    Diabetes Daughter    Diabetes Paternal Uncle    Diabetes Paternal Grandmother    Breast cancer Maternal Aunt     Social History   Socioeconomic History   Marital status: Divorced    Spouse name: Not on file   Number of children: Not on file   Years of education: Not on file   Highest education level: Bachelor's degree (e.g., BA, AB, BS)  Occupational History   Occupation: Full time    Employer: [employer]  Tobacco Use   Smoking status: Former    Packs/day: 1.00    Years: 15.00    Additional pack years: 0.00    Total pack years: 15.00    Types: Cigarettes    Quit date: 08/26/1981    Years since quitting: 41.4   Smokeless tobacco: Never  Vaping Use   Vaping Use: Never used  Substance and  Sexual Activity   Alcohol use: Not Currently   Drug use: No   Sexual activity: Not Currently  Other Topics Concern   Not on file  Social History Narrative   Divorced   Gets regular exercise   Social Determinants of Health   Financial Resource Strain: Low Risk  (06/25/2021)   Overall Financial Resource Strain (CARDIA)    Difficulty of  Paying Living Expenses: Not hard at all  Food Insecurity: No Food Insecurity (06/25/2021)   Hunger Vital Sign    Worried About Running Out of Food in the Last Year: Never true    Ran Out of Food in the Last Year: Never true  Transportation Needs: No Transportation Needs (06/25/2021)   PRAPARE - Administrator, Civil Service (Medical): No    Lack of Transportation (Non-Medical): No  Physical Activity: Insufficiently Active (06/25/2021)   Exercise Vital Sign    Days of Exercise per Week: 2 days    Minutes of Exercise per Session: 40 min  Stress: No Stress Concern Present (06/25/2021)   [employer] of Occupational Health - Occupational Stress Questionnaire    Feeling of Stress : Not at all  Social Connections: Moderately Integrated (03/16/2018)   Social Connection and Isolation Panel [NHANES]    Frequency of Communication with Friends and Family: More than three times a week    Frequency of Social Gatherings with Friends and Family: More than three times a week    Attends Religious Services: More than 4 times per year    Active Member of [employer] or Organizations: Yes    Attends Engineer, structural: More than 4 times per year    Marital Status: Divorced  Intimate Partner Violence: Not At Risk (03/16/2018)   Humiliation, Afraid, Rape, and Kick questionnaire    Fear of Current or Ex-Partner: No    Emotionally Abused: No    Physically Abused: No    Sexually Abused: No    Review of Systems: See HPI, otherwise negative ROS  Physical Exam: BP (!) 177/83   Pulse 71   Temp (!) 96.5 F (35.8 C) (Temporal)   Resp 16   Ht 5'  3.5" (1.613 m)   Wt 97.3 kg   SpO2 100%   BMI 37.42 kg/m  General:   Alert,  pleasant and cooperative in NAD Head:  Normocephalic and atraumatic. Neck:  Supple; no masses or thyromegaly. Lungs:  Clear throughout to auscultation, normal respiratory effort.    Heart:  +S1, +S2, Regular rate and rhythm, No edema. Abdomen:  Soft, nontender and nondistended. Normal bowel sounds, without guarding, and without rebound.   Neurologic:  Alert and  oriented x4;  grossly normal neurologically.  Impression/Plan: [name] is here for an colonoscopy to be performed for positive cologuard.  Risks, benefits, limitations, and alternatives regarding  colonoscopy have been reviewed with the patient.  Questions have been answered.  All parties agreeable.   Wyline Mood, MD  01/13/2023, 9:11 AM

## 2023-01-14 ENCOUNTER — Encounter: Payer: Self-pay | Admitting: Gastroenterology

## 2023-01-14 NOTE — Anesthesia Postprocedure Evaluation (Signed)
Anesthesia Post Note  Patient: [name]  Procedure(s) Performed: COLONOSCOPY WITH PROPOFOL  Patient location during evaluation: PACU Anesthesia Type: General Level of consciousness: awake and alert Pain management: pain level controlled Vital Signs Assessment: post-procedure vital signs reviewed and stable Respiratory status: spontaneous breathing, nonlabored ventilation, respiratory function stable and patient connected to nasal cannula oxygen Cardiovascular status: blood pressure returned to baseline and stable Postop Assessment: no apparent nausea or vomiting Anesthetic complications: no   No notable events documented.   Last Vitals:  Vitals:   01/13/23 0910 01/13/23 0942  BP: (!) 177/83 (!) 109/49  Pulse: 71   Resp: 16   Temp: (!) 35.8 C (!) 35.6 C  SpO2: 100%     Last Pain:  Vitals:   01/14/23 0737  TempSrc:   PainSc: 0-No pain                 Yevette Edwards

## 2023-01-16 ENCOUNTER — Encounter: Payer: Self-pay | Admitting: Gastroenterology

## 2023-01-16 LAB — SURGICAL PATHOLOGY

## 2023-01-21 ENCOUNTER — Ambulatory Visit
Admission: RE | Admit: 2023-01-21 | Discharge: 2023-01-21 | Disposition: A | Discharge status: Home / Self Care | Payer: Medicare PPO | Source: Ambulatory Visit | Attending: Nurse Practitioner | Admitting: Nurse Practitioner

## 2023-01-21 DIAGNOSIS — H3589 Other specified retinal disorders: Secondary | ICD-10-CM | POA: Diagnosis not present

## 2023-01-21 DIAGNOSIS — H524 Presbyopia: Secondary | ICD-10-CM | POA: Diagnosis not present

## 2023-01-21 DIAGNOSIS — H16223 Keratoconjunctivitis sicca, not specified as Sjogren's, bilateral: Secondary | ICD-10-CM | POA: Diagnosis not present

## 2023-01-21 DIAGNOSIS — H35033 Hypertensive retinopathy, bilateral: Secondary | ICD-10-CM | POA: Diagnosis not present

## 2023-01-21 DIAGNOSIS — Z853 Personal history of malignant neoplasm of breast: Secondary | ICD-10-CM | POA: Diagnosis not present

## 2023-01-21 DIAGNOSIS — E119 Type 2 diabetes mellitus without complications: Secondary | ICD-10-CM | POA: Diagnosis not present

## 2023-01-21 DIAGNOSIS — Z1231 Encounter for screening mammogram for malignant neoplasm of breast: Secondary | ICD-10-CM | POA: Insufficient documentation

## 2023-01-21 DIAGNOSIS — I10 Essential (primary) hypertension: Secondary | ICD-10-CM | POA: Diagnosis not present

## 2023-01-21 DIAGNOSIS — H35372 Puckering of macula, left eye: Secondary | ICD-10-CM | POA: Diagnosis not present

## 2023-01-21 DIAGNOSIS — H18593 Other hereditary corneal dystrophies, bilateral: Secondary | ICD-10-CM | POA: Diagnosis not present

## 2023-01-21 DIAGNOSIS — Z961 Presence of intraocular lens: Secondary | ICD-10-CM | POA: Diagnosis not present

## 2023-01-21 LAB — HM DIABETES EYE EXAM

## 2023-01-22 ENCOUNTER — Encounter: Payer: Self-pay | Admitting: Nurse Practitioner

## 2023-01-22 NOTE — Progress Notes (Signed)
Contacted via MyChart   Normal mammogram, may repeat in one year:)

## 2023-01-23 ENCOUNTER — Other Ambulatory Visit: Payer: Self-pay | Admitting: Nurse Practitioner

## 2023-01-23 NOTE — Telephone Encounter (Signed)
Requested Prescriptions  Pending Prescriptions Disp Refills   atorvastatin (LIPITOR) 40 MG tablet [Pharmacy Med Name: ATORVASTATIN 40 MG TABLET] 90 tablet 0    Sig: Take 1 tablet (40 mg total) by mouth daily.     Cardiovascular:  Antilipid - Statins Failed - 01/23/2023  7:52 AM      Failed - Lipid Panel in normal range within the last 12 months    Cholesterol, Total  Date Value Ref Range Status  11/26/2022 140 100 - 199 mg/dL Final   Cholesterol [place], [place]  Date Value Ref Range Status  03/29/2019 145 <200 mg/dL Final    Comment:                            Desirable                <200                         Borderline High      200- 239                         High                     >239    LDL Chol Calc (NIH)  Date Value Ref Range Status  11/26/2022 68 0 - 99 mg/dL Final   HDL  Date Value Ref Range Status  11/26/2022 47 >39 mg/dL Final   Triglycerides  Date Value Ref Range Status  11/26/2022 142 0 - 149 mg/dL Final   Triglycerides Piccolo,Waived  Date Value Ref Range Status  03/29/2019 124 <150 mg/dL Final    Comment:                            Normal                   <150                         Borderline High     150 - 199                         High                200 - 499                         Very High                >499          Passed - Patient is not pregnant      Passed - Valid encounter within last 12 months    Recent Outpatient Visits           1 month ago 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

## 2023-01-30 DIAGNOSIS — M9901 Segmental and somatic dysfunction of cervical region: Secondary | ICD-10-CM | POA: Diagnosis not present

## 2023-01-30 DIAGNOSIS — M62830 Muscle spasm of back: Secondary | ICD-10-CM | POA: Diagnosis not present

## 2023-01-30 DIAGNOSIS — M9903 Segmental and somatic dysfunction of lumbar region: Secondary | ICD-10-CM | POA: Diagnosis not present

## 2023-01-30 DIAGNOSIS — M5033 Other cervical disc degeneration, cervicothoracic region: Secondary | ICD-10-CM | POA: Diagnosis not present

## 2023-02-14 ENCOUNTER — Other Ambulatory Visit: Payer: Self-pay | Admitting: Nurse Practitioner

## 2023-02-14 NOTE — Telephone Encounter (Signed)
Requested Prescriptions  Pending Prescriptions Disp Refills   fluticasone (FLONASE) 50 MCG/ACT nasal spray [Pharmacy Med Name: FLUTICASONE PROP 50 MCG SPRAY] 48 g 0    Sig: Place 2 sprays into both nostrils daily.     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

## 2023-02-22 NOTE — Patient Instructions (Signed)
Be Involved in Your Health Care:  Taking Medications When medications are taken as directed, they can greatly improve your health. But if they are not taken as instructed, they may not work. In some cases, not taking them correctly can be harmful. To help ensure your treatment remains effective and safe, understand your medications and how to take them.  Your lab results, notes and after visit summary will be available on My Chart. We strongly encourage you to use this feature. If lab results are abnormal the clinic will contact you with the appropriate steps. If the clinic does not contact you assume the results are satisfactory. You can always see your results on My Chart. If you have questions regarding your condition, please contact the clinic during office hours. You can also ask questions on My Chart.  We at [facility] are grateful that you chose us to provide care. We strive to provide excellent and compassionate care and are always looking for feedback. If you get a survey from the clinic please complete this.   Diabetes Mellitus Basics  Diabetes mellitus, or diabetes, is a long-term (chronic) disease. It occurs when the body does not properly use sugar (glucose) that is released from food after you eat. Diabetes mellitus may be caused by one or both of these problems: Your pancreas does not make enough of a hormone called insulin. Your body does not react in a normal way to the insulin that it makes. Insulin lets glucose enter cells in your body. This gives you energy. If you have diabetes, glucose cannot get into cells. This causes high blood glucose (hyperglycemia). How to treat and manage diabetes You may need to take insulin or other diabetes medicines daily to keep your glucose in balance. If you are prescribed insulin, you will learn how to give yourself insulin by injection. You may need to adjust the amount of insulin you take based on the foods that you eat. You will  need to check your blood glucose levels using a glucose monitor as told by your health care provider. The readings can help determine if you have low or high blood glucose. Generally, you should have these blood glucose levels: Before meals (preprandial): 80-130 mg/dL (4.4-7.2 mmol/L). After meals (postprandial): below 180 mg/dL (10 mmol/L). Hemoglobin A1c (HbA1c) level: less than 7%. Your health care provider will set treatment goals for you. Keep all follow-up visits. This is important. Follow these instructions at home: Diabetes medicines Take your diabetes medicines every day as told by your health care provider. List your diabetes medicines here: Name of medicine: ______________________________ Amount (dose): _______________ Time (a.m./p.m.): _______________ Notes: ___________________________________ Name of medicine: ______________________________ Amount (dose): _______________ Time (a.m./p.m.): _______________ Notes: ___________________________________ Name of medicine: ______________________________ Amount (dose): _______________ Time (a.m./p.m.): _______________ Notes: ___________________________________ Insulin If you use insulin, list the types of insulin you use here: Insulin type: ______________________________ Amount (dose): _______________ Time (a.m./p.m.): _______________Notes: ___________________________________ Insulin type: ______________________________ Amount (dose): _______________ Time (a.m./p.m.): _______________ Notes: ___________________________________ Insulin type: ______________________________ Amount (dose): _______________ Time (a.m./p.m.): _______________ Notes: ___________________________________ Insulin type: ______________________________ Amount (dose): _______________ Time (a.m./p.m.): _______________ Notes: ___________________________________ Insulin type: ______________________________ Amount (dose): _______________ Time (a.m./p.m.): _______________  Notes: ___________________________________ Managing blood glucose  Check your blood glucose levels using a glucose monitor as told by your health care provider. Write down the times that you check your glucose levels here: Time: _______________ Notes: ___________________________________ Time: _______________ Notes: ___________________________________ Time: _______________ Notes: ___________________________________ Time: _______________ Notes: ___________________________________ Time: _______________ Notes: ___________________________________ Time: _______________ Notes: ___________________________________    Low blood glucose Low blood glucose (hypoglycemia) is when glucose is at or below 70 mg/dL (3.9 mmol/L). Symptoms may include: Feeling: Hungry. Sweaty and clammy. Irritable or easily upset. Dizzy. Sleepy. Having: A fast heartbeat. A headache. A change in your vision. Numbness around the mouth, lips, or tongue. Having trouble with: Moving (coordination). Sleeping. Treating low blood glucose To treat low blood glucose, eat or drink something containing sugar right away. If you can think clearly and swallow safely, follow the 15:15 rule: Take 15 grams of a fast-acting carb (carbohydrate), as told by your health care provider. Some fast-acting carbs are: Glucose tablets: take 3-4 tablets. Hard candy: eat 3-5 pieces. Fruit juice: drink 4 oz (120 mL). Regular (not diet) soda: drink 4-6 oz (120-180 mL). Honey or sugar: eat 1 Tbsp (15 mL). Check your blood glucose levels 15 minutes after you take the carb. If your glucose is still at or below 70 mg/dL (3.9 mmol/L), take 15 grams of a carb again. If your glucose does not go above 70 mg/dL (3.9 mmol/L) after 3 tries, get help right away. After your glucose goes back to normal, eat a meal or a snack within 1 hour. Treating very low blood glucose If your glucose is at or below 54 mg/dL (3 mmol/L), you have very low blood glucose  (severe hypoglycemia). This is an emergency. Do not wait to see if the symptoms will go away. Get medical help right away. Call your local emergency services (911 in the [place] Do not drive yourself to the hospital. Questions to ask your health care provider Should I talk with a diabetes educator? What equipment will I need to care for myself at home? What diabetes medicines do I need? When should I take them? How often do I need to check my blood glucose levels? What number can I call if I have questions? When is my follow-up visit? Where can I find a support group for people with diabetes? Where to find more information American Diabetes Association: www.diabetes.org Association of Diabetes Care and Education Specialists: www.diabeteseducator.org Contact a health care provider if: Your blood glucose is at or above 240 mg/dL (13.3 mmol/L) for 2 days in a row. You have been sick or have had a fever for 2 days or more, and you are not getting better. You have any of these problems for more than 6 hours: You cannot eat or drink. You feel nauseous. You vomit. You have diarrhea. Get help right away if: Your blood glucose is lower than 54 mg/dL (3 mmol/L). You get confused. You have trouble thinking clearly. You have trouble breathing. These symptoms may represent a serious problem that is an emergency. Do not wait to see if the symptoms will go away. Get medical help right away. Call your local emergency services (911 in the [place] Do not drive yourself to the hospital. Summary Diabetes mellitus is a chronic disease that occurs when the body does not properly use sugar (glucose) that is released from food after you eat. Take insulin and diabetes medicines as told. Check your blood glucose every day, as often as told. Keep all follow-up visits. This is important. This information is not intended to replace advice given to you by your health care provider. Make sure you discuss any  questions you have with your health care provider. Document Revised: 12/14/2019 Document Reviewed: 12/14/2019 Elsevier Patient Education  2024 Elsevier Inc.  

## 2023-02-25 ENCOUNTER — Encounter: Payer: Self-pay | Admitting: Nurse Practitioner

## 2023-02-25 ENCOUNTER — Ambulatory Visit: Payer: Medicare PPO | Admitting: Nurse Practitioner

## 2023-02-25 VITALS — BP 125/79 | HR 64 | Temp 98.0°F | Ht 63.58 in | Wt 218.6 lb

## 2023-02-25 DIAGNOSIS — I440 Atrioventricular block, first degree: Secondary | ICD-10-CM | POA: Diagnosis not present

## 2023-02-25 DIAGNOSIS — E1169 Type 2 diabetes mellitus with other specified complication: Secondary | ICD-10-CM

## 2023-02-25 DIAGNOSIS — E1159 Type 2 diabetes mellitus with other circulatory complications: Secondary | ICD-10-CM | POA: Diagnosis not present

## 2023-02-25 DIAGNOSIS — Z7984 Long term (current) use of oral hypoglycemic drugs: Secondary | ICD-10-CM

## 2023-02-25 DIAGNOSIS — T451X5A Adverse effect of antineoplastic and immunosuppressive drugs, initial encounter: Secondary | ICD-10-CM

## 2023-02-25 DIAGNOSIS — Z950 Presence of cardiac pacemaker: Secondary | ICD-10-CM

## 2023-02-25 DIAGNOSIS — G620 Drug-induced polyneuropathy: Secondary | ICD-10-CM

## 2023-02-25 DIAGNOSIS — I152 Hypertension secondary to endocrine disorders: Secondary | ICD-10-CM | POA: Diagnosis not present

## 2023-02-25 DIAGNOSIS — E785 Hyperlipidemia, unspecified: Secondary | ICD-10-CM | POA: Diagnosis not present

## 2023-02-25 LAB — BAYER DCA HB A1C WAIVED: HB A1C (BAYER DCA - WAIVED): 6.7 % — ABNORMAL HIGH (ref 4.8–5.6)

## 2023-02-25 MED ORDER — ATORVASTATIN CALCIUM 40 MG PO TABS: 40.0000 mg | ORAL_TABLET | Freq: Every day | ORAL | 4 refills | Status: DC

## 2023-02-25 MED ORDER — MONTELUKAST SODIUM 10 MG PO TABS: 10.0000 mg | ORAL_TABLET | Freq: Every day | ORAL | 4 refills | Status: DC

## 2023-02-25 MED ORDER — TIZANIDINE HCL 2 MG PO TABS: 2.0000 mg | ORAL_TABLET | Freq: Three times a day (TID) | ORAL | 2 refills | Status: DC | PRN

## 2023-02-25 MED ORDER — AMILORIDE HCL 5 MG PO TABS: 5.0000 mg | ORAL_TABLET | Freq: Every day | ORAL | 4 refills | Status: DC

## 2023-02-25 MED ORDER — EMPAGLIFLOZIN 25 MG PO TABS: 25.0000 mg | ORAL_TABLET | Freq: Every day | ORAL | 4 refills | Status: DC

## 2023-02-25 MED ORDER — FLUTICASONE PROPIONATE 50 MCG/ACT NA SUSP: 2.0000 | Freq: Every day | NASAL | 4 refills | Status: DC

## 2023-02-25 NOTE — Assessment & Plan Note (Signed)
Continue collaboration with cardiology for checks. °

## 2023-02-25 NOTE — Assessment & Plan Note (Signed)
Chronic, ongoing.  Continue current medication regimen and adjust as needed. Lipid panel today. 

## 2023-02-25 NOTE — Assessment & Plan Note (Signed)
Chronic, stable.  BP at goal at home and in office.  Continue current medication regimen and adjust as needed + continue collaboration with cardiology.  Recent note reviewed.  LABS: BMP.  Urine ALB 24 December 2022.  Recommend she continue to monitor BP at home and focus on DASH diet.  Return in 3 months.

## 2023-02-25 NOTE — Assessment & Plan Note (Signed)
Chronic, ongoing with A1c 6.7% today, slight trend up due to dietary indiscretions. Urine ALB 24 December 2022. Continue current medication regimen as A1c at goal and no hypoglycemia at home. If continued stable A1c trend may consider reduction of medication.  Continue to monitor BS at home daily.   Return in 3 months for visit.

## 2023-02-25 NOTE — Progress Notes (Signed)
BP 125/79   Pulse 64   Temp 98 F (36.7 C) (Oral)   Ht 5' 3.58" (1.615 m)   Wt 218 lb 9.6 oz (99.2 kg)   SpO2 98%   BMI 38.02 kg/m    Subjective:    Patient ID: [name], female    DOB: 1950-12-24, 72 y.o.   MRN: [id number]  HPI: [name] is a 72 y.o. female  Chief Complaint  Patient presents with   Hypertension   Hyperlipidemia   Diabetes   DIABETES A1c April 6.5%.  Continues on Jardiance 25 MG daily, previously took Metformin, but switched to SGLT2 for heart health.   Hypoglycemic episodes:no Polydipsia/polyuria: no Visual disturbance: no Chest pain: no Paresthesias: yes bilateral hands this is chronic Glucose Monitoring:   Accucheck frequency: Daily  Fasting glucose: 104 this morning  Post prandial:  Evening:  Before meals: Taking Insulin?: no  Long acting insulin:  Short acting insulin: Blood Pressure Monitoring: weekly Retinal Examination: Up To Date, [facility] Foot Exam: Up To Date Pneumovax: Up to Date Influenza: Up to Date Aspirin: no   HYPERTENSION / HYPERLIPIDEMIA Has pacemaker, PPM [employer] -- 1 year left on pacemaker. Followed by cardiology and last seen 08/22/22 to return in 9 months.   Current medications: Atenolol, Amiloride, Atorvastatin. Satisfied with current treatment? yes Duration of hypertension: chronic BP monitoring frequency: weekly BP range: <130/80 BP medication side effects: no Past BP meds:  Duration of hyperlipidemia: chronic Cholesterol medication side effects: no Cholesterol supplements: none Past cholesterol medications:  Medication compliance: excellent compliance Aspirin: no Recent stressors: yes Recurrent headaches: occasional at baseline, sinus with pollen Visual changes: no Palpitations: no Dyspnea: no Chest pain: no Lower extremity edema: yes, baseline to left due to arthritis Dizzy/lightheaded: no   HISTORY OF BREAST CA & NEUROPATHY: Neuropathy and hypomagnesemia due to cancer history and  treatments, was followed by oncology in past.  Received magnesium infusions, at this time is transitioned to PCP monitoring. Takes Lyrica for neuropathy pain + magnesium supplement daily with recent level 1.7 April -- levels have been stable since 05/15/21.   Neuropathy status: controlled  Satisfied with current treatment?: yes Medication side effects: no Medication compliance:  excellent compliance Location: multiple locations Pain: yes Severity: moderate  Quality:  sharp, burning, tender, tingling, creeping, and pins and needles Frequency: intermittent Bilateral: yes Symmetric: yes Numbness: yes Decreased sensation: yes Weakness: no Context: stable Alleviating factors: Lyrica Aggravating factors: unknown  Relevant past medical, surgical, family and social history reviewed and updated as indicated. Interim medical history since our last visit reviewed. Allergies and medications reviewed and updated.  Review of Systems  Constitutional:  Negative for activity change, appetite change, diaphoresis, fatigue and fever.  Respiratory:  Negative for cough, chest tightness and shortness of breath.   Cardiovascular:  Negative for chest pain, palpitations and leg swelling.  Gastrointestinal: Negative.   Neurological: Negative.   Psychiatric/Behavioral: Negative.      Per HPI unless specifically indicated above     Objective:    BP 125/79   Pulse 64   Temp 98 F (36.7 C) (Oral)   Ht 5' 3.58" (1.615 m)   Wt 218 lb 9.6 oz (99.2 kg)   SpO2 98%   BMI 38.02 kg/m   Wt Readings from Last 3 Encounters:  02/25/23 218 lb 9.6 oz (99.2 kg)  01/13/23 214 lb 9.6 oz (97.3 kg)  11/26/22 216 lb 1.6 oz (98 kg)    Physical Exam Vitals and  nursing note reviewed.  Constitutional:      General: She is awake. She is not in acute distress.    Appearance: She is well-developed and well-groomed. She is morbidly obese. She is not ill-appearing.  HENT:     Head: Normocephalic.     Right Ear: Hearing,  ear canal and external ear normal.     Left Ear: Hearing, ear canal and external ear normal.     Mouth/Throat:     Mouth: Mucous membranes are moist.  Eyes:     General: Lids are normal.        Right eye: No discharge.        Left eye: No discharge.     Conjunctiva/sclera: Conjunctivae normal.     Pupils: Pupils are equal, round, and reactive to light.  Neck:     Thyroid: No thyromegaly.     Vascular: No carotid bruit.  Cardiovascular:     Rate and Rhythm: Normal rate and regular rhythm.     Heart sounds: Normal heart sounds. No murmur heard.    No gallop.  Pulmonary:     Effort: Pulmonary effort is normal. No accessory muscle usage or respiratory distress.     Breath sounds: Normal breath sounds.  Abdominal:     General: Bowel sounds are normal.     Palpations: Abdomen is soft.  Musculoskeletal:     Cervical back: Normal range of motion and neck supple.     Right lower leg: No edema.     Left lower leg: No edema.  Skin:    General: Skin is warm and dry.  Neurological:     Mental Status: She is alert and oriented to person, place, and time.  Psychiatric:        Attention and Perception: Attention normal.        Mood and Affect: Mood normal.        Speech: Speech normal.        Behavior: Behavior normal. Behavior is cooperative.        Thought Content: Thought content normal.     Results for orders placed or performed in visit on 01/22/23  HM DIABETES EYE EXAM  Result Value Ref Range   HM Diabetic Eye Exam No Retinopathy No Retinopathy      Assessment & Plan:   Problem List Items Addressed This Visit       Cardiovascular and Mediastinum   AV BLOCK, 1ST DEGREE    Stable.  Followed by cardiology, continue collaboration.  Recent note reviewed.      Relevant Medications   atorvastatin (LIPITOR) 40 MG tablet   aMILoride (MIDAMOR) 5 MG tablet   Hypertension associated with diabetes (HCC)    Chronic, stable.  BP at goal at home and in office.  Continue current  medication regimen and adjust as needed + continue collaboration with cardiology.  Recent note reviewed.  LABS: BMP.  Urine ALB 24 December 2022.  Recommend she continue to monitor BP at home and focus on DASH diet.  Return in 3 months.      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 Urine ALB 24 December 2022. Continue current medication regimen as A1c at goal and no hypoglycemia at home. If continued stable A1c trend may consider reduction of medication.  Continue to monitor BS at home daily.   Return in 3 months for visit.      Relevant Medications   empagliflozin (JARDIANCE) 25 MG TABS tablet   atorvastatin (LIPITOR) 40 MG tablet   Other Relevant Orders   Bayer DCA Hb A1c Waived   Hyperlipidemia associated with type 2 diabetes mellitus (HCC)    Chronic, ongoing.  Continue current medication regimen and adjust as needed.  Lipid panel today.      Relevant Medications   empagliflozin (JARDIANCE) 25 MG TABS tablet   atorvastatin (LIPITOR) 40 MG tablet   aMILoride (MIDAMOR) 5 MG tablet   Other Relevant Orders   Bayer DCA Hb A1c Waived     Nervous and Auditory   Chemotherapy-induced neuropathy (HCC)    Chronic, ongoing.  Continue Lyrica and adjust as needed, especially with aging monitor dose closely.  Refills up to date.  Return in 3 months.      Relevant Medications   tiZANidine (ZANAFLEX) 2 MG tablet     Other   Hypomagnesemia    Ongoing and stable.  Recent level normal. Continue amiloride and follow up with oncology as needed. Magnesium rechecked today. Has had stable levels since being on Jardiance, almost 2 years of normal levels now.       Relevant Orders   Magnesium   Morbid obesity (HCC)   Relevant Medications   empagliflozin (JARDIANCE) 25 MG TABS tablet   PPM-[employer]    Continue collaboration with cardiology for checks.        Follow up plan: Return in about 3 months (around 05/28/2023) for T2DM, HTN/HLD, NEUROPATHY.

## 2023-02-25 NOTE — Assessment & Plan Note (Signed)
Ongoing and stable.  Recent level normal. Continue amiloride and follow up with oncology as needed. Magnesium rechecked today. Has had stable levels since being on Jardiance, almost 2 years of normal levels now.

## 2023-02-25 NOTE — Assessment & Plan Note (Signed)
Stable.  Followed by cardiology, continue collaboration.  Recent note reviewed. 

## 2023-02-25 NOTE — Assessment & Plan Note (Signed)
Chronic, ongoing.  Continue Lyrica and adjust as needed, especially with aging monitor dose closely.  Refills up to date.  Return in 3 months. 

## 2023-02-26 LAB — BASIC METABOLIC PANEL
BUN/Creatinine Ratio: 22 (ref 12–28)
BUN: 22 mg/dL (ref 8–27)
CO2: 24 mmol/L (ref 20–29)
Calcium: 9.6 mg/dL (ref 8.7–10.3)
Chloride: 103 mmol/L (ref 96–106)
Creatinine, Ser: 1.01 mg/dL — ABNORMAL HIGH (ref 0.57–1.00)
Glucose: 110 mg/dL — ABNORMAL HIGH (ref 70–99)
Potassium: 4.2 mmol/L (ref 3.5–5.2)
Sodium: 142 mmol/L (ref 134–144)
eGFR: 59 mL/min/{1.73_m2} — ABNORMAL LOW (ref >59)

## 2023-02-26 LAB — MAGNESIUM: Magnesium: 1.9 mg/dL (ref 1.6–2.3)

## 2023-02-26 NOTE — Progress Notes (Signed)
Contacted via MyChart   Good afternoon Nasia, your labs have returned and continue to be stable.  Kidney function, creatinine and eGFR continues to show stable mild chronic kidney disease stage 3a with no worsening.  Magnesium levels remains at goal.  I suspect in future we may be able to trial stopping Amiloride.  For now continue all medications.  Any questions? Keep being amazing!!  Thank you for allowing me to participate in your care.  I appreciate you. Kindest regards, Axyl Sitzman

## 2023-03-02 ENCOUNTER — Other Ambulatory Visit: Payer: Self-pay | Admitting: Nurse Practitioner

## 2023-03-02 DIAGNOSIS — I152 Hypertension secondary to endocrine disorders: Secondary | ICD-10-CM

## 2023-03-03 NOTE — Telephone Encounter (Signed)
Requested Prescriptions  Pending Prescriptions Disp Refills   atenolol (TENORMIN) 50 MG tablet [Pharmacy Med Name: ATENOLOL 50 MG TABLET] 90 tablet 1    Sig: Take 1 tablet (50 mg total) by mouth daily.     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)   [place] [facility] [place], [place]ter T, NP   3 months ago 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

## 2023-03-06 DIAGNOSIS — M62830 Muscle spasm of back: Secondary | ICD-10-CM | POA: Diagnosis not present

## 2023-03-06 DIAGNOSIS — M5033 Other cervical disc degeneration, cervicothoracic region: Secondary | ICD-10-CM | POA: Diagnosis not present

## 2023-03-06 DIAGNOSIS — M9901 Segmental and somatic dysfunction of cervical region: Secondary | ICD-10-CM | POA: Diagnosis not present

## 2023-03-06 DIAGNOSIS — M9903 Segmental and somatic dysfunction of lumbar region: Secondary | ICD-10-CM | POA: Diagnosis not present

## 2023-03-07 DIAGNOSIS — M9901 Segmental and somatic dysfunction of cervical region: Secondary | ICD-10-CM | POA: Diagnosis not present

## 2023-03-07 DIAGNOSIS — M62830 Muscle spasm of back: Secondary | ICD-10-CM | POA: Diagnosis not present

## 2023-03-07 DIAGNOSIS — M5033 Other cervical disc degeneration, cervicothoracic region: Secondary | ICD-10-CM | POA: Diagnosis not present

## 2023-03-07 DIAGNOSIS — M9903 Segmental and somatic dysfunction of lumbar region: Secondary | ICD-10-CM | POA: Diagnosis not present

## 2023-04-11 DIAGNOSIS — M9901 Segmental and somatic dysfunction of cervical region: Secondary | ICD-10-CM | POA: Diagnosis not present

## 2023-04-11 DIAGNOSIS — M5033 Other cervical disc degeneration, cervicothoracic region: Secondary | ICD-10-CM | POA: Diagnosis not present

## 2023-04-11 DIAGNOSIS — M62830 Muscle spasm of back: Secondary | ICD-10-CM | POA: Diagnosis not present

## 2023-04-11 DIAGNOSIS — M9903 Segmental and somatic dysfunction of lumbar region: Secondary | ICD-10-CM | POA: Diagnosis not present

## 2023-05-08 DIAGNOSIS — M9903 Segmental and somatic dysfunction of lumbar region: Secondary | ICD-10-CM | POA: Diagnosis not present

## 2023-05-08 DIAGNOSIS — M5033 Other cervical disc degeneration, cervicothoracic region: Secondary | ICD-10-CM | POA: Diagnosis not present

## 2023-05-08 DIAGNOSIS — M9901 Segmental and somatic dysfunction of cervical region: Secondary | ICD-10-CM | POA: Diagnosis not present

## 2023-05-08 DIAGNOSIS — M62830 Muscle spasm of back: Secondary | ICD-10-CM | POA: Diagnosis not present

## 2023-05-12 ENCOUNTER — Other Ambulatory Visit: Payer: Self-pay | Admitting: Nurse Practitioner

## 2023-05-13 NOTE — Telephone Encounter (Signed)
Requested Prescriptions  Pending Prescriptions Disp Refills   fluticasone (FLONASE) 50 MCG/ACT nasal spray [Pharmacy Med Name: FLUTICASONE PROP 50 MCG SPRAY] 48 g 0    Sig: Place 2 sprays into both nostrils daily.     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

## 2023-05-18 ENCOUNTER — Other Ambulatory Visit: Payer: Self-pay | Admitting: Nurse Practitioner

## 2023-05-20 ENCOUNTER — Other Ambulatory Visit: Payer: Self-pay | Admitting: Nurse Practitioner

## 2023-05-20 DIAGNOSIS — T451X5A Adverse effect of antineoplastic and immunosuppressive drugs, initial encounter: Secondary | ICD-10-CM

## 2023-05-20 NOTE — Telephone Encounter (Signed)
Requested medication (s) are due for refill today - yes  Requested medication (s) are on the active medication list -yes  Future visit scheduled -yes  Last refill: 02/25/23 #30 2RF  Notes to clinic: non delegated Rx  Requested Prescriptions  Pending Prescriptions Disp Refills   tiZANidine (ZANAFLEX) 2 MG tablet [Pharmacy Med Name: TIZANIDINE HCL 2 MG TABLET] 30 tablet 0    Sig: Take 1 tablet (2 mg total) by mouth every 8 (eight) hours as needed for muscle spasms.     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)   [place] [facility] [place], Dorie Rank, NP   9 months ago Medicare annual wellness visit, subsequent   [place] [facility] [place], [place] T, NP   1 year ago 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

## 2023-05-20 NOTE — Telephone Encounter (Signed)
Requested medications are due for refill today.  no  Requested medications are on the active medications list.  yes  Last refill. 11/07/2022 #270 2 rf  Future visit scheduled.   yes  Notes to clinic.  Refill/refusal not delegated.    Requested Prescriptions  Pending Prescriptions Disp Refills   pregabalin (LYRICA) 50 MG capsule [Pharmacy Med Name: PREGABALIN 50 MG CAPSULE] 270 capsule 0    Sig: Take 1 capsule (50 mg total) by mouth 3 (three) times daily.     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)   [place] [facility] [place], [place] T, NP   5 months ago 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

## 2023-05-22 ENCOUNTER — Other Ambulatory Visit: Payer: Self-pay | Admitting: Nurse Practitioner

## 2023-05-22 DIAGNOSIS — G620 Drug-induced polyneuropathy: Secondary | ICD-10-CM

## 2023-05-22 NOTE — Telephone Encounter (Signed)
Requested medication (s) are due for refill today: yes 05/24/23  Requested medication (s) are on the active medication list: yes   Last refill:  11/07/22 #270 2 refills   Future visit scheduled: yes in 6 days   Notes to clinic:  not delegated per protocol. Do you want to refill Rx?     Requested Prescriptions  Pending Prescriptions Disp Refills   pregabalin (LYRICA) 50 MG capsule [Pharmacy Med Name: PREGABALIN 50 MG CAPSULE] 270 capsule 0    Sig: Take 1 capsule (50 mg total) by mouth 3 (three) times daily.     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

## 2023-05-25 NOTE — Patient Instructions (Signed)
Be Involved in Caring For Your Health:  Taking Medications When medications are taken as directed, they can greatly improve your health. But if they are not taken as prescribed, they may not work. In some cases, not taking them correctly can be harmful. To help ensure your treatment remains effective and safe, understand your medications and how to take them. Bring your medications to each visit for review by your provider.  Your lab results, notes, and after visit summary will be available on My Chart. We strongly encourage you to use this feature. If lab results are abnormal the clinic will contact you with the appropriate steps. If the clinic does not contact you assume the results are satisfactory. You can always view your results on My Chart. If you have questions regarding your health or results, please contact the clinic during office hours. You can also ask questions on My Chart.  We at [facility] are grateful that you chose us to provide your care. We strive to provide evidence-based and compassionate care and are always looking for feedback. If you get a survey from the clinic please complete this so we can hear your opinions.  Diabetes Mellitus Basics  Diabetes mellitus, or diabetes, is a long-term (chronic) disease. It occurs when the body does not properly use sugar (glucose) that is released from food after you eat. Diabetes mellitus may be caused by one or both of these problems: Your pancreas does not make enough of a hormone called insulin. Your body does not react in a normal way to the insulin that it makes. Insulin lets glucose enter cells in your body. This gives you energy. If you have diabetes, glucose cannot get into cells. This causes high blood glucose (hyperglycemia). How to treat and manage diabetes You may need to take insulin or other diabetes medicines daily to keep your glucose in balance. If you are prescribed insulin, you will learn how to give  yourself insulin by injection. You may need to adjust the amount of insulin you take based on the foods that you eat. You will need to check your blood glucose levels using a glucose monitor as told by your health care provider. The readings can help determine if you have low or high blood glucose. Generally, you should have these blood glucose levels: Before meals (preprandial): 80-130 mg/dL (4.4-7.2 mmol/L). After meals (postprandial): below 180 mg/dL (10 mmol/L). Hemoglobin A1c (HbA1c) level: less than 7%. Your health care provider will set treatment goals for you. Keep all follow-up visits. This is important. Follow these instructions at home: Diabetes medicines Take your diabetes medicines every day as told by your health care provider. List your diabetes medicines here: Name of medicine: ______________________________ Amount (dose): _______________ Time (a.m./p.m.): _______________ Notes: ___________________________________ Name of medicine: ______________________________ Amount (dose): _______________ Time (a.m./p.m.): _______________ Notes: ___________________________________ Name of medicine: ______________________________ Amount (dose): _______________ Time (a.m./p.m.): _______________ Notes: ___________________________________ Insulin If you use insulin, list the types of insulin you use here: Insulin type: ______________________________ Amount (dose): _______________ Time (a.m./p.m.): _______________Notes: ___________________________________ Insulin type: ______________________________ Amount (dose): _______________ Time (a.m./p.m.): _______________ Notes: ___________________________________ Insulin type: ______________________________ Amount (dose): _______________ Time (a.m./p.m.): _______________ Notes: ___________________________________ Insulin type: ______________________________ Amount (dose): _______________ Time (a.m./p.m.): _______________ Notes:  ___________________________________ Insulin type: ______________________________ Amount (dose): _______________ Time (a.m./p.m.): _______________ Notes: ___________________________________ Managing blood glucose  Check your blood glucose levels using a glucose monitor as told by your health care provider. Write down the times that you check your glucose levels here: Time: _______________ Notes: ___________________________________   Time: _______________ Notes: ___________________________________ Time: _______________ Notes: ___________________________________ Time: _______________ Notes: ___________________________________ Time: _______________ Notes: ___________________________________ Time: _______________ Notes: ___________________________________  Low blood glucose Low blood glucose (hypoglycemia) is when glucose is at or below 70 mg/dL (3.9 mmol/L). Symptoms may include: Feeling: Hungry. Sweaty and clammy. Irritable or easily upset. Dizzy. Sleepy. Having: A fast heartbeat. A headache. A change in your vision. Numbness around the mouth, lips, or tongue. Having trouble with: Moving (coordination). Sleeping. Treating low blood glucose To treat low blood glucose, eat or drink something containing sugar right away. If you can think clearly and swallow safely, follow the 15:15 rule: Take 15 grams of a fast-acting carb (carbohydrate), as told by your health care provider. Some fast-acting carbs are: Glucose tablets: take 3-4 tablets. Hard candy: eat 3-5 pieces. Fruit juice: drink 4 oz (120 mL). Regular (not diet) soda: drink 4-6 oz (120-180 mL). Honey or sugar: eat 1 Tbsp (15 mL). Check your blood glucose levels 15 minutes after you take the carb. If your glucose is still at or below 70 mg/dL (3.9 mmol/L), take 15 grams of a carb again. If your glucose does not go above 70 mg/dL (3.9 mmol/L) after 3 tries, get help right away. After your glucose goes back to normal, eat a meal  or a snack within 1 hour. Treating very low blood glucose If your glucose is at or below 54 mg/dL (3 mmol/L), you have very low blood glucose (severe hypoglycemia). This is an emergency. Do not wait to see if the symptoms will go away. Get medical help right away. Call your local emergency services (911 in the [place] Do not drive yourself to the hospital. Questions to ask your health care provider Should I talk with a diabetes educator? What equipment will I need to care for myself at home? What diabetes medicines do I need? When should I take them? How often do I need to check my blood glucose levels? What number can I call if I have questions? When is my follow-up visit? Where can I find a support group for people with diabetes? Where to find more information American Diabetes Association: www.diabetes.org Association of Diabetes Care and Education Specialists: www.diabeteseducator.org Contact a health care provider if: Your blood glucose is at or above 240 mg/dL (13.3 mmol/L) for 2 days in a row. You have been sick or have had a fever for 2 days or more, and you are not getting better. You have any of these problems for more than 6 hours: You cannot eat or drink. You feel nauseous. You vomit. You have diarrhea. Get help right away if: Your blood glucose is lower than 54 mg/dL (3 mmol/L). You get confused. You have trouble thinking clearly. You have trouble breathing. These symptoms may represent a serious problem that is an emergency. Do not wait to see if the symptoms will go away. Get medical help right away. Call your local emergency services (911 in the [place] Do not drive yourself to the hospital. Summary Diabetes mellitus is a chronic disease that occurs when the body does not properly use sugar (glucose) that is released from food after you eat. Take insulin and diabetes medicines as told. Check your blood glucose every day, as often as told. Keep all follow-up visits. This  is important. This information is not intended to replace advice given to you by your health care provider. Make sure you discuss any questions you have with your health care provider. Document Revised: 12/14/2019 Document Reviewed: 12/14/2019 Elsevier Patient Education    2024 Elsevier Inc.  

## 2023-05-26 ENCOUNTER — Other Ambulatory Visit: Payer: Self-pay | Admitting: Nurse Practitioner

## 2023-05-26 DIAGNOSIS — G620 Drug-induced polyneuropathy: Secondary | ICD-10-CM

## 2023-05-27 NOTE — Telephone Encounter (Signed)
Requested medications are due for refill today.  yes  Requested medications are on the active medications list.  yes  Last refill. 11/07/2022 #270 2 rf  Future visit scheduled.   yes  Notes to clinic.  Called pharmacy. Controlled medication rx's expire at 6 months. Pt will need a new rx called in.    Requested Prescriptions  Pending Prescriptions Disp Refills   pregabalin (LYRICA) 50 MG capsule [Pharmacy Med Name: PREGABALIN 50 MG CAPSULE] 270 capsule 0    Sig: Take 1 capsule (50 mg total) by mouth 3 (three) times daily.     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

## 2023-05-28 ENCOUNTER — Ambulatory Visit: Payer: Medicare PPO | Admitting: Nurse Practitioner

## 2023-05-28 ENCOUNTER — Encounter: Payer: Self-pay | Admitting: Nurse Practitioner

## 2023-05-28 VITALS — BP 111/60 | HR 65 | Temp 97.7°F | Resp 16 | Ht 63.5 in | Wt 217.4 lb

## 2023-05-28 DIAGNOSIS — E785 Hyperlipidemia, unspecified: Secondary | ICD-10-CM | POA: Diagnosis not present

## 2023-05-28 DIAGNOSIS — I440 Atrioventricular block, first degree: Secondary | ICD-10-CM | POA: Diagnosis not present

## 2023-05-28 DIAGNOSIS — E1159 Type 2 diabetes mellitus with other circulatory complications: Secondary | ICD-10-CM

## 2023-05-28 DIAGNOSIS — Z950 Presence of cardiac pacemaker: Secondary | ICD-10-CM | POA: Diagnosis not present

## 2023-05-28 DIAGNOSIS — E1169 Type 2 diabetes mellitus with other specified complication: Secondary | ICD-10-CM | POA: Diagnosis not present

## 2023-05-28 DIAGNOSIS — E66812 Obesity, class 2: Secondary | ICD-10-CM | POA: Diagnosis not present

## 2023-05-28 DIAGNOSIS — T451X5A Adverse effect of antineoplastic and immunosuppressive drugs, initial encounter: Secondary | ICD-10-CM

## 2023-05-28 DIAGNOSIS — I152 Hypertension secondary to endocrine disorders: Secondary | ICD-10-CM | POA: Diagnosis not present

## 2023-05-28 DIAGNOSIS — G620 Drug-induced polyneuropathy: Secondary | ICD-10-CM | POA: Diagnosis not present

## 2023-05-28 DIAGNOSIS — Z23 Encounter for immunization: Secondary | ICD-10-CM | POA: Diagnosis not present

## 2023-05-28 NOTE — Assessment & Plan Note (Signed)
Chronic, ongoing.  Continue Lyrica and adjust as needed, especially with aging monitor dose closely.  Refills up to date.  Return in 3 months. 

## 2023-05-28 NOTE — Assessment & Plan Note (Signed)
BMI 37.91 with T2DM and HTN -- is working on weight loss.  Recommended eating smaller high protein, low fat meals more frequently and exercising 30 mins a day 5 times a week with a goal of 10-15 lb weight loss in the next 3 months. Patient voiced their understanding and motivation to adhere to these recommendations.

## 2023-05-28 NOTE — Assessment & Plan Note (Signed)
Stable.  Followed by cardiology, continue collaboration.  Recent note reviewed.

## 2023-05-28 NOTE — Assessment & Plan Note (Signed)
Ongoing and stable.  Recent level normal. Continue amiloride and follow up with oncology as needed. Magnesium rechecked today. Has had stable levels since being on Jardiance, since 05/15/21.

## 2023-05-28 NOTE — Assessment & Plan Note (Signed)
Chronic, ongoing.  Continue current medication regimen and adjust as needed. Lipid panel today. 

## 2023-05-28 NOTE — Assessment & Plan Note (Signed)
Chronic, ongoing with A1c 6.7% in July, slight trend up due to dietary indiscretions = recheck today. Urine ALB 24 December 2022. Continue current medication regimen as A1c at goal and no hypoglycemia at home. If continued stable A1c trend may consider reduction of medication.  Continue to monitor BS at home daily.   Return in 3 months for visit.

## 2023-05-28 NOTE — Assessment & Plan Note (Signed)
Chronic, stable.  BP at goal at office and home.  Continue current medication regimen and adjust as needed + continue collaboration with cardiology.  Recent note reviewed.  LABS: CMP.  Urine ALB 24 December 2022.  Recommend she continue to monitor BP at home and focus on DASH diet.  Return in 3 months.

## 2023-05-28 NOTE — Assessment & Plan Note (Signed)
Continue collaboration with cardiology for checks. 

## 2023-05-28 NOTE — Progress Notes (Signed)
BP 111/60 (BP Location: Right Arm, Patient Position: Sitting, Cuff Size: Large)   Pulse 65   Temp 97.7 F (36.5 C) (Oral)   Resp 16   Ht 5' 3.5" (1.613 m) Comment: per patient  Wt 217 lb 6.4 oz (98.6 kg)   SpO2 99%   BMI 37.91 kg/m    Subjective:    Patient ID: [name], female    DOB: 03/29/1951, 72 y.o.   MRN: [id number]  HPI: [name] is a 72 y.o. female  Chief Complaint  Patient presents with   Diabetes   Hypertension   Hyperlipidemia   Peripheral Neuropathy   DIABETES A1c 6.7% in July remaining below goal.  Continues on Jardiance 25 MG daily, previously took Metformin, but switched to SGLT2 for heart health, this has also benefited magnesium levels which have been normal for almost 2 years.  Has been more careful with diet since last visit. Hypoglycemic episodes:no Polydipsia/polyuria: no Visual disturbance: no Chest pain: no Paresthesias: yes bilateral hands this is chronic Glucose Monitoring:   Accucheck frequency: Daily  Fasting glucose: 117 this morning, on average 100-110 range  Post prandial:  Evening:  Before meals: Taking Insulin?: no  Long acting insulin:  Short acting insulin: Blood Pressure Monitoring: weekly Retinal Examination: Up To Date, [facility] -- returns in November Foot Exam: Up To Date Pneumovax: Up to Date Influenza: Up to Date Aspirin: no   HYPERTENSION / HYPERLIPIDEMIA Followed by cardiology with last visit 08/22/22, to see annually. Has pacemaker, PPM [employer] -- >1 year left on pacemaker.  Current medications: Atenolol, Atorvastatin, Slow Mag. Satisfied with current treatment? yes Duration of hypertension: chronic BP monitoring frequency: weekly BP range: <130/80 BP medication side effects: no Past BP meds:  Duration of hyperlipidemia: chronic Cholesterol medication side effects: no Cholesterol supplements: none Past cholesterol medications:  Medication compliance: excellent compliance Aspirin: no Recent  stressors: yes Recurrent headaches: occasional at baseline due to allergies Visual changes: no Palpitations: no Dyspnea: no Chest pain: no Lower extremity edema: yes, baseline to left leg due to arthritis Dizzy/lightheaded: no   NEUROPATHY (HANDS) -- HISTORY OF BREAST CA: Neuropathy and hypomagnesemia due to cancer history and treatments, was followed by oncology in past but transitioned to PCP for this.  Received magnesium infusions, however levels have been stable since 05/15/21 after starting Jardiance. Takes Lyrica for neuropathy pain.  Reports this is getting worse, was holding pen recently and it dropped out of hand.    Is having some back pain, is going to chiropractor tomorrow + gets massage monthly. Neuropathy status: controlled  Satisfied with current treatment?: yes Medication side effects: no Medication compliance:  excellent compliance Location: hands Pain: yes Severity: moderate  Quality:  sharp, burning, tender, tingling, creeping, and pins and needles Frequency: intermittent -- can be constant Bilateral: yes Symmetric: yes Numbness: yes Decreased sensation: yes Weakness: yes Context: some worsening present Alleviating factors: Lyrica Aggravating factors: unknown  Relevant past medical, surgical, family and social history reviewed and updated as indicated. Interim medical history since our last visit reviewed. Allergies and medications reviewed and updated.  Review of Systems  Constitutional:  Negative for activity change, appetite change, diaphoresis, fatigue and fever.  Respiratory:  Negative for cough, chest tightness and shortness of breath.   Cardiovascular:  Negative for chest pain, palpitations and leg swelling.  Gastrointestinal: Negative.   Neurological: Negative.   Psychiatric/Behavioral: Negative.      Per HPI unless specifically indicated above  Objective:    BP 111/60 (BP Location: Right Arm, Patient Position: Sitting, Cuff Size: Large)    Pulse 65   Temp 97.7 F (36.5 C) (Oral)   Resp 16   Ht 5' 3.5" (1.613 m) Comment: per patient  Wt 217 lb 6.4 oz (98.6 kg)   SpO2 99%   BMI 37.91 kg/m   Wt Readings from Last 3 Encounters:  05/28/23 217 lb 6.4 oz (98.6 kg)  02/25/23 218 lb 9.6 oz (99.2 kg)  01/13/23 214 lb 9.6 oz (97.3 kg)    Physical Exam Vitals and nursing note reviewed.  Constitutional:      General: She is awake. She is not in acute distress.    Appearance: She is well-developed and well-groomed. She is obese. She is not ill-appearing or toxic-appearing.  HENT:     Head: Normocephalic.     Right Ear: Hearing, ear canal and external ear normal.     Left Ear: Hearing, ear canal and external ear normal.     Mouth/Throat:     Mouth: Mucous membranes are moist.  Eyes:     General: Lids are normal.        Right eye: No discharge.        Left eye: No discharge.     Conjunctiva/sclera: Conjunctivae normal.     Pupils: Pupils are equal, round, and reactive to light.  Neck:     Thyroid: No thyromegaly.     Vascular: No carotid bruit.  Cardiovascular:     Rate and Rhythm: Normal rate and regular rhythm.     Heart sounds: Normal heart sounds. No murmur heard.    No gallop.  Pulmonary:     Effort: Pulmonary effort is normal. No accessory muscle usage or respiratory distress.     Breath sounds: Normal breath sounds.  Abdominal:     General: Bowel sounds are normal.     Palpations: Abdomen is soft.  Musculoskeletal:     Cervical back: Normal range of motion and neck supple.     Right lower leg: No edema.     Left lower leg: No edema.  Lymphadenopathy:     Cervical: No cervical adenopathy.  Skin:    General: Skin is warm and dry.  Neurological:     Mental Status: She is alert and oriented to person, place, and time.     Cranial Nerves: Cranial nerves 2-12 are intact.  Psychiatric:        Attention and Perception: Attention normal.        Mood and Affect: Mood normal.        Speech: Speech normal.         Behavior: Behavior normal. Behavior is cooperative.        Thought Content: Thought content normal.    Diabetic Foot Exam - Simple   Simple Foot Form Visual Inspection No deformities, no ulcerations, no other skin breakdown bilaterally: Yes Sensation Testing Intact to touch and monofilament testing bilaterally: Yes Pulse Check Posterior Tibialis and Dorsalis pulse intact bilaterally: Yes Comments    Results for orders placed or performed in visit on 02/25/23  Bayer DCA Hb A1c Waived  Result Value Ref Range   HB A1C (BAYER DCA - WAIVED) 6.7 (H) 4.8 - 5.6 %  Basic metabolic panel  Result Value Ref Range   Glucose 110 (H) 70 - 99 mg/dL   BUN 22 8 - 27 mg/dL   Creatinine, Ser 7.82 (H) 0.57 - 1.00 mg/dL   eGFR 59 (  L) >59 mL/min/1.73   BUN/Creatinine Ratio 22 12 - 28   Sodium 142 134 - 144 mmol/L   Potassium 4.2 3.5 - 5.2 mmol/L   Chloride 103 96 - 106 mmol/L   CO2 24 20 - 29 mmol/L   Calcium 9.6 8.7 - 10.3 mg/dL  Magnesium  Result Value Ref Range   Magnesium 1.9 1.6 - 2.3 mg/dL      Assessment & Plan:   Problem List Items Addressed This Visit       Cardiovascular and Mediastinum   AV BLOCK, 1ST DEGREE    Stable.  Followed by cardiology, continue collaboration.  Recent note reviewed.      Hypertension associated with diabetes (HCC)    Chronic, stable.  BP at goal at office and home.  Continue current medication regimen and adjust as needed + continue collaboration with cardiology.  Recent note reviewed.  LABS: CMP.  Urine ALB 24 December 2022.  Recommend she continue to monitor BP at home and focus on DASH diet.  Return in 3 months.      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 Urine ALB 24 December 2022. Continue current medication regimen as A1c at goal and no  hypoglycemia at home. If continued stable A1c trend may consider reduction of medication.  Continue to monitor BS at home daily.   Return in 3 months for visit.      Relevant Orders   Comprehensive metabolic panel   HgB A1c   Hyperlipidemia associated with type 2 diabetes mellitus (HCC)    Chronic, ongoing.  Continue current medication regimen and adjust as needed.  Lipid panel today.      Relevant Orders   Comprehensive metabolic panel   Lipid Panel w/o Chol/HDL Ratio   HgB A1c     Nervous and Auditory   Chemotherapy-induced neuropathy (HCC)    Chronic, ongoing.  Continue Lyrica and adjust as needed, especially with aging monitor dose closely.  Refills up to date.  Return in 3 months.        Other   Hypomagnesemia    Ongoing and stable.  Recent level normal. Continue amiloride and follow up with oncology as needed. Magnesium rechecked today. Has had stable levels since being on Jardiance, since 05/15/21.      Relevant Orders   Magnesium   Obesity    BMI 37.91 with T2DM and HTN -- is working on weight loss.  Recommended eating smaller high protein, low fat meals more frequently and exercising 30 mins a day 5 times a week with a goal of 10-15 lb weight loss in the next 3 months. Patient voiced their understanding and motivation to adhere to these recommendations.       PPM-[employer]    Continue collaboration with cardiology for checks.      Other Visit Diagnoses     Flu vaccine need       Flu vaccine in office today, educated patient.   Relevant Orders   Flu Vaccine Trivalent High Dose (Fluad) (Completed)        Follow up plan: Return in about 3 months (around 08/28/2023) for T2DM, HTN/HLD, NEUROPATHY.

## 2023-05-29 DIAGNOSIS — M5033 Other cervical disc degeneration, cervicothoracic region: Secondary | ICD-10-CM | POA: Diagnosis not present

## 2023-05-29 DIAGNOSIS — M62830 Muscle spasm of back: Secondary | ICD-10-CM | POA: Diagnosis not present

## 2023-05-29 DIAGNOSIS — M9903 Segmental and somatic dysfunction of lumbar region: Secondary | ICD-10-CM | POA: Diagnosis not present

## 2023-05-29 DIAGNOSIS — M9901 Segmental and somatic dysfunction of cervical region: Secondary | ICD-10-CM | POA: Diagnosis not present

## 2023-05-29 LAB — COMPREHENSIVE METABOLIC PANEL
ALT: 25 [IU]/L (ref 0–32)
AST: 24 [IU]/L (ref 0–40)
Albumin: 4.4 g/dL (ref 3.8–4.8)
Alkaline Phosphatase: 113 [IU]/L (ref 44–121)
BUN/Creatinine Ratio: 24 (ref 12–28)
BUN: 27 mg/dL (ref 8–27)
Bilirubin Total: 0.4 mg/dL (ref 0.0–1.2)
CO2: 24 mmol/L (ref 20–29)
Calcium: 9.7 mg/dL (ref 8.7–10.3)
Chloride: 104 mmol/L (ref 96–106)
Creatinine, Ser: 1.12 mg/dL — ABNORMAL HIGH (ref 0.57–1.00)
Globulin, Total: 2.4 g/dL (ref 1.5–4.5)
Glucose: 112 mg/dL — ABNORMAL HIGH (ref 70–99)
Potassium: 4.3 mmol/L (ref 3.5–5.2)
Sodium: 144 mmol/L (ref 134–144)
Total Protein: 6.8 g/dL (ref 6.0–8.5)
eGFR: 52 mL/min/{1.73_m2} — ABNORMAL LOW (ref >59)

## 2023-05-29 LAB — LIPID PANEL W/O CHOL/HDL RATIO
Cholesterol, Total: 135 mg/dL (ref 100–199)
HDL: 47 mg/dL (ref >39)
LDL Chol Calc (NIH): 64 mg/dL (ref 0–99)
Triglycerides: 141 mg/dL (ref 0–149)
VLDL Cholesterol Cal: 24 mg/dL (ref 5–40)

## 2023-05-29 LAB — HEMOGLOBIN A1C
Est. average glucose Bld gHb Est-mCnc: 137 mg/dL
Hgb A1c MFr Bld: 6.4 % — ABNORMAL HIGH (ref 4.8–5.6)

## 2023-05-29 LAB — MAGNESIUM: Magnesium: 1.8 mg/dL (ref 1.6–2.3)

## 2023-05-29 NOTE — Progress Notes (Signed)
Contacted via MyChart   Good afternoon [name], I have good news.  A1c went from 6.7% to 6.4%.  Great job!!  Kidney function, creatinine and eGFR, continues to show stage 3a kidney disease with no worsening.  Remainder of labs look great.  Any questions? Keep being amazing!!  Thank you for allowing me to participate in your care.  I appreciate you. Kindest regards, Windsor Goeken

## 2023-06-17 ENCOUNTER — Other Ambulatory Visit: Payer: Self-pay | Admitting: Nurse Practitioner

## 2023-06-18 NOTE — Telephone Encounter (Signed)
Requested medication (s) are due for refill today: Yes  Requested medication (s) are on the active medication list: Yes  Last refill:  05/20/23  Future visit scheduled: Yes  Notes to clinic:  Not delegated.     Requested Prescriptions  Pending Prescriptions Disp Refills   tiZANidine (ZANAFLEX) 2 MG tablet [Pharmacy Med Name: TIZANIDINE HCL 2 MG TABLET] 30 tablet 0    Sig: Take 1 tablet (2 mg total) by mouth every 8 (eight) hours as needed for muscle spasms.     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

## 2023-06-25 IMAGING — DX DG LUMBAR SPINE COMPLETE 4+V
5 series · 5 of 5 positions shown · non-contrast
Comparison: CT of the abdomen pelvis dated 09/22/2020.

CLINICAL DATA: Back pain.

EXAM:
LUMBAR SPINE - COMPLETE 4+ VIEW

[l-spine ap]
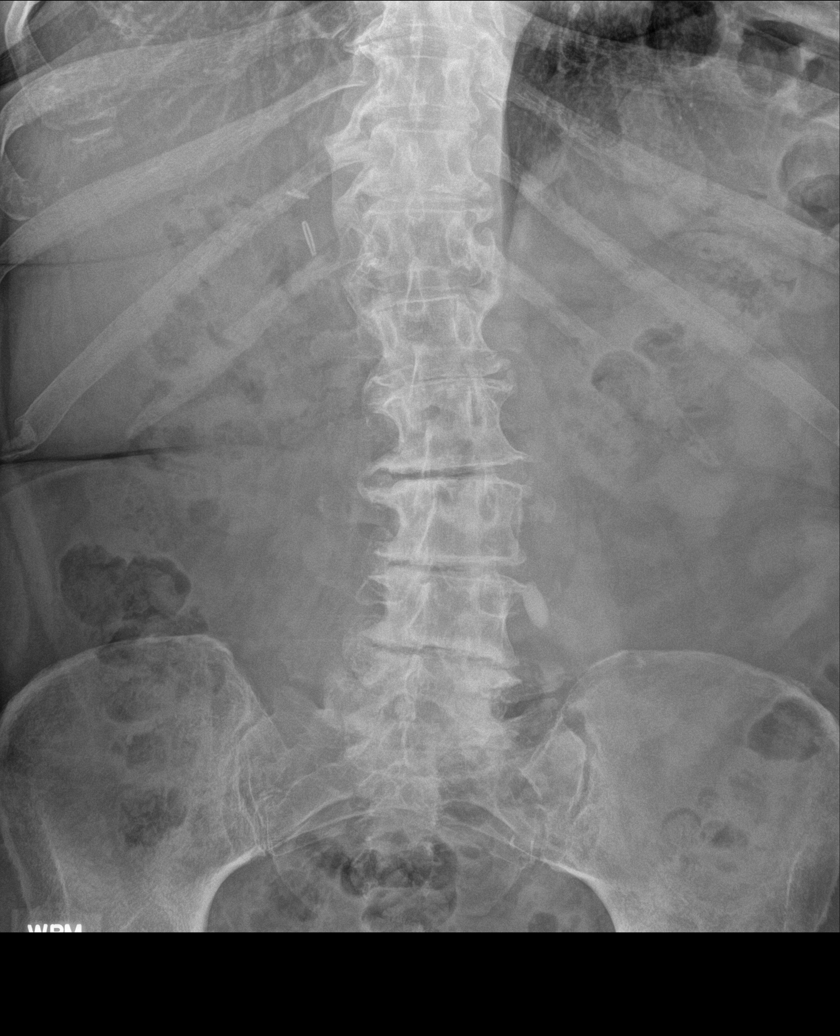

[l-spine obl (1 of 2)]
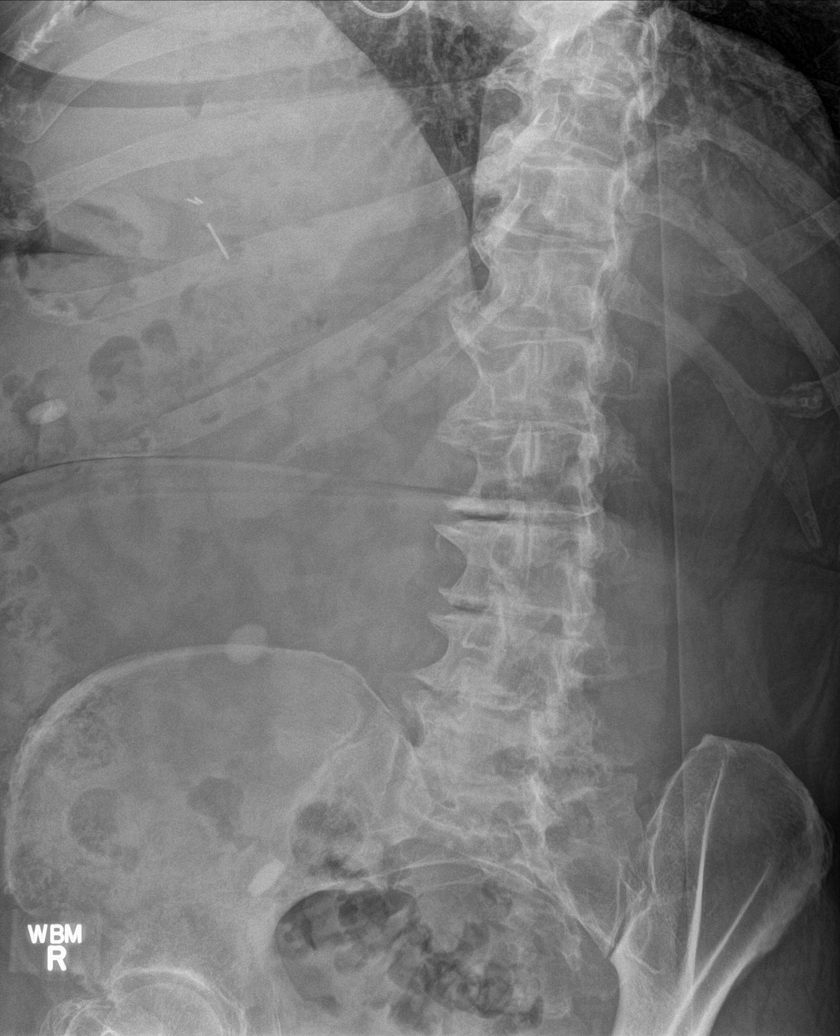

[l-spine obl (2 of 2)]
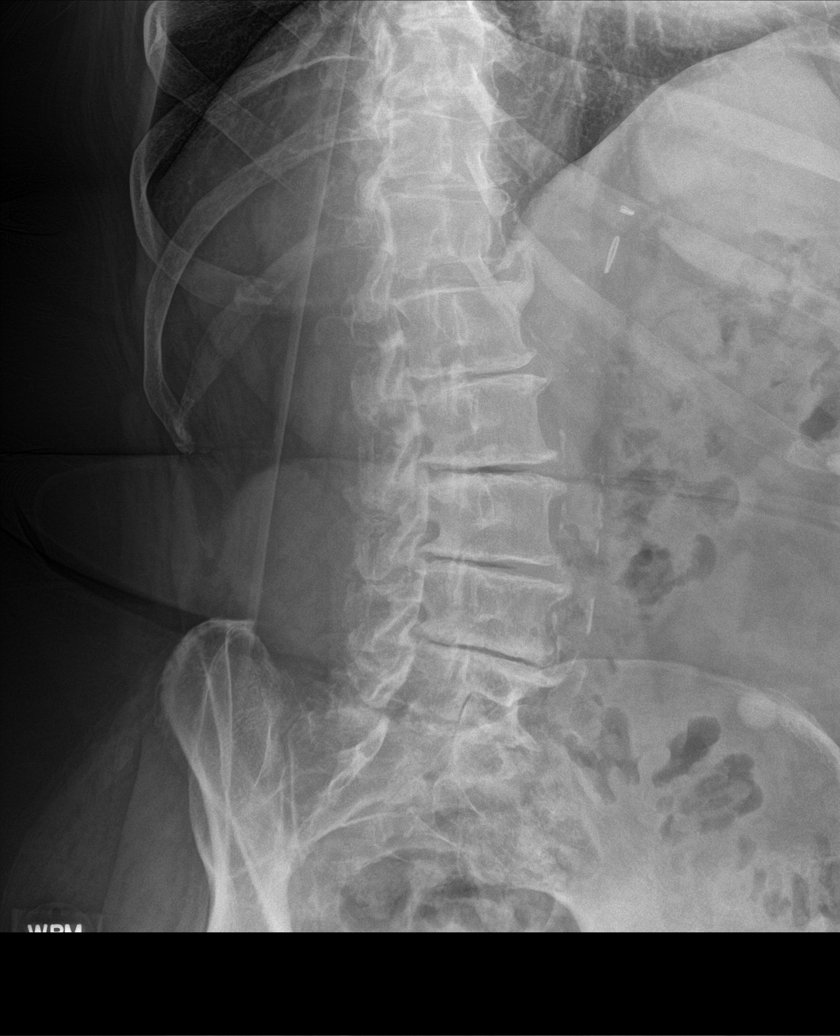

[l-spine lat]
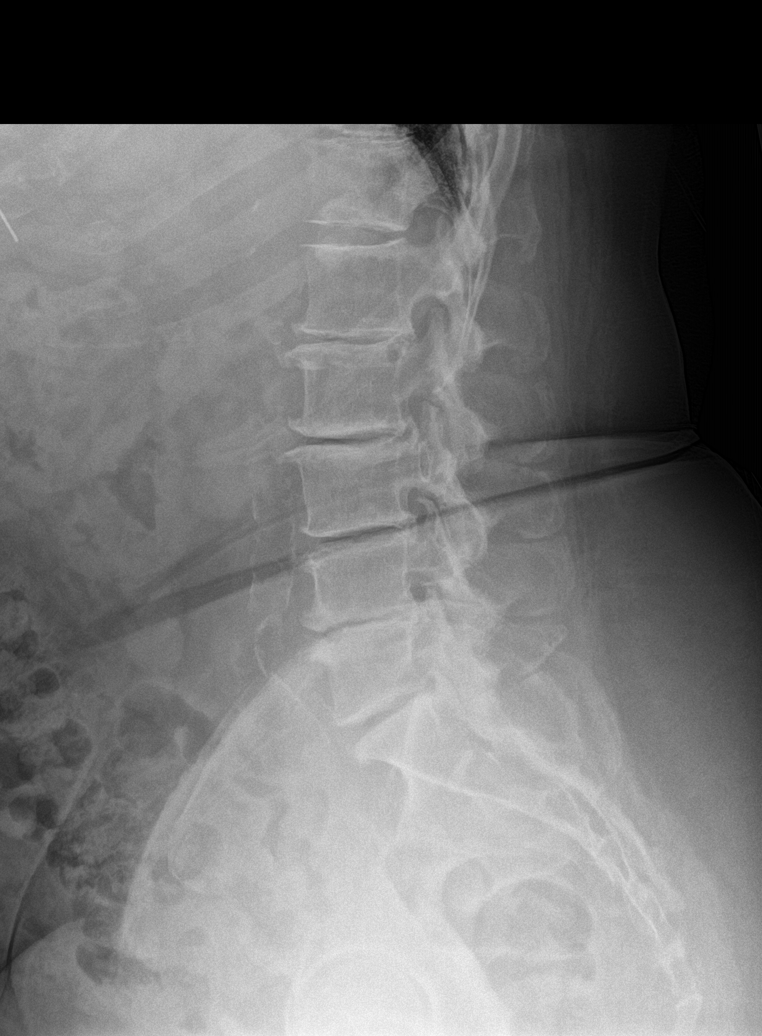

[l-spine spot]
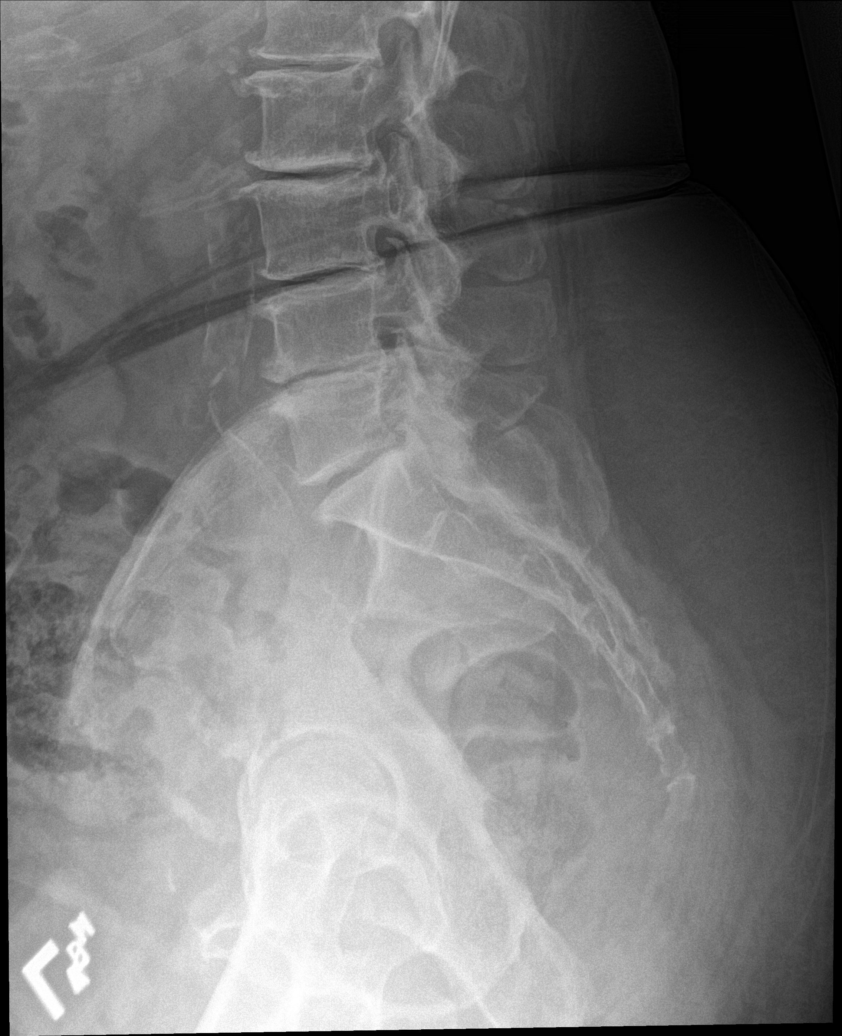

[5 of 5 positions shown; findings below may reference images not displayed]

FINDINGS: Five lumbar type vertebra the there is no acute fracture or
subluxation of the lumbar spine. The bones are osteopenic.
Multilevel degenerative changes with disc space narrowing, endplate
irregularity, and spurring. There is mild levoscoliosis centered 5
L2-L3. Lower lumbar facet arthropathy. Atherosclerotic calcification
of the aorta. Right upper quadrant cholecystectomy clips. The soft
tissues are unremarkable
IMPRESSION: 1. No acute findings.
2. Osteopenia with multilevel degenerative changes.

## 2023-06-26 DIAGNOSIS — M62830 Muscle spasm of back: Secondary | ICD-10-CM | POA: Diagnosis not present

## 2023-06-26 DIAGNOSIS — M5033 Other cervical disc degeneration, cervicothoracic region: Secondary | ICD-10-CM | POA: Diagnosis not present

## 2023-06-26 DIAGNOSIS — M9903 Segmental and somatic dysfunction of lumbar region: Secondary | ICD-10-CM | POA: Diagnosis not present

## 2023-06-26 DIAGNOSIS — M9901 Segmental and somatic dysfunction of cervical region: Secondary | ICD-10-CM | POA: Diagnosis not present

## 2023-07-01 ENCOUNTER — Telehealth: Payer: Self-pay | Admitting: *Deleted

## 2023-07-01 NOTE — Patient Instructions (Signed)
Visit Information  Thank you for taking time to visit with me today. Please don't hesitate to contact me if I can be of assistance to you.  Please call the care guide team at [phone] if you need to cancel or reschedule your appointment.   Please call the Suicide and Crisis Lifeline: 988 call the [place] National Suicide Prevention Lifeline: 639-666-5823 or TTY: 279-056-4928 TTY 908-680-1046) to talk to a trained counselor call 1-800-273-TALK (toll free, 24 hour hotline) call 911 if you are experiencing a Mental Health or Behavioral Health Crisis or need someone to talk to.  Patient verbalizes understanding of instructions and care plan provided today and agrees to view in MyChart. Active MyChart status and patient understanding of how to access instructions and care plan via MyChart confirmed with patient.     The patient has been provided with contact information for the care management team and has been advised to call with any health related questions or concerns.   Kemper Durie RN, MSN, CCM [facility], [facility] RN Care Coordinator Direct Dial: [phone] / Main [phone] Fax [phone] Email: [email or web address]: [email or web address]

## 2023-07-01 NOTE — Patient Outreach (Signed)
  Care Coordination   Initial Visit Note   07/01/2023 Name: ANAHLA BEVIS MRN: [id number] DOB: 1950/11/15  Elby Showers Halvorsen is a 72 y.o. year old female who sees Aura Dials T, NP for primary care. I spoke with  Robyne Peers by phone today.  What matters to the patients health and wellness today?  Program explained to patient, does not have time to talk today and does not feel she has any needs.  She state she will contact this RNCM should any needs arise.      SDOH assessments and interventions completed:  No     Care Coordination Interventions:  No, not indicated   Follow up plan: No further intervention required.   Encounter Outcome:  Patient Visit Completed   Kemper Durie RN, MSN, CCM [place]  [facility], [facility] RN Care Coordinator Direct Dial: [phone] / Main [phone] Fax [phone] Email: [email or web address]: [email or web address]

## 2023-07-07 ENCOUNTER — Encounter: Payer: Self-pay | Admitting: Internal Medicine

## 2023-07-07 ENCOUNTER — Ambulatory Visit: Payer: Medicare PPO | Attending: Internal Medicine | Admitting: Internal Medicine

## 2023-07-07 VITALS — BP 114/64 | HR 68 | Ht 63.5 in | Wt 220.2 lb

## 2023-07-07 DIAGNOSIS — I5032 Chronic diastolic (congestive) heart failure: Secondary | ICD-10-CM

## 2023-07-07 DIAGNOSIS — I440 Atrioventricular block, first degree: Secondary | ICD-10-CM

## 2023-07-07 NOTE — H&P (View-Only) (Signed)
 Patient ID: [name], female   DOB: 1950/09/20, 73 y.o.   MRN: [id number] Man    Electrophysiology Office Note   Date:  07/07/2023   ID:  [name], DOB 1951-05-15, MRN [id number]  PCP:  Marjie Skiff, NP  Cardiologist:  none Primary Electrophysiologist:      No chief complaint on file.     History of Present Illness: [name] is a 72 y.o. 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 for which she underwent pacing remotely w/ generator replacement Dec 2011    Hx of breast CA 2000's   Echo 06/2010 normal LV function    Her device has reached EOS, as she is not on remotes No change in symptoms  Neg sleep study in the past   Date   Cr  K Hgb   12/21 0.91 4.8  (2/21)13.4   6/22 1.10  5.1 (5/22) 10.4   3/23 1.13 4.6 13.1   10/24 1.12 4.3 13.7 (4/24)     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   Ovarian cyst 11/06/2020   Pacemaker [employer] 2004   Personal history of chemotherapy 2009   left breast ca   Personal history of radiation therapy 2009   left breast   Sinus tachycardia    Type 2 diabetes mellitus (HCC) 06/19/2015   Past Surgical History:  Procedure Laterality Date   ANKLE SURGERY Left    x 3   BREAST EXCISIONAL BIOPSY Left 2009    Stage IIa, ER/PR negative, 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,   BREAST LUMPECTOMY Left 2009   chemo and rad tx   CARPAL TUNNEL RELEASE     CESAREAN  SECTION  1983   CHOLECYSTECTOMY     COLONOSCOPY WITH PROPOFOL N/A 01/13/2023   Procedure: COLONOSCOPY WITH PROPOFOL;  Surgeon: Wyline Mood, MD;  Location: [facility] ENDOSCOPY;  Service: Gastroenterology;  Laterality: N/A;   EYE SURGERY Left 12/2018   cataracts-Dr.Shah in [place]    EYE SURGERY Right 01/2019   cataracts- Dr.Shah in [facility]   GALLBLADDER SURGERY  1993   INSERT / REPLACE / REMOVE PACEMAKER  2011   revision @ [facility]   PACEMAKER INSERTION  05/13/2003   Neoma Laming - DR   ROBOTIC ASSISTED SALPINGO OOPHERECTOMY Right 01/18/2021   Procedure: XI ROBOTIC ASSISTED RIGHT SALPINGO-OOPHORECTOMY;  Surgeon: Natale Milch, MD;  Location: [facility] ORS;  Service: Gynecology;  Laterality: Right;   Allergies:   Penicillins, Levofloxacin, and Other   Physical Exam: BP 114/64   Pulse 68   Ht 5' 3.5" (1.613 m)   Wt 220 lb 3.2 oz (99.9 kg)   SpO2 97%   BMI 38.39 kg/m  Well developed and well nourished in no acute distress HENT normal Neck supple with JVP-flat Clear Device pocket well healed; without hematoma or erythema.  There is no tethering  Regular rate and rhythm, no  gallop No murmur Abd-soft with active BS No Clubbing cyanosis  edema Skin-warm and dry A & Oriented  Grossly normal sensory and motor function  ECG sinus @ 68 23/08/40  Device function is at EOS Programming changes none   See Paceart for details    ASSESSMENT AND PLAN: Dyspnea on Exertion/HFpEF  1 AV block--intermittent  Hypertension  Diabetes  Hypomagnesemia  Pacemaker [employer]     Vpacing decreased 30>>>>6%   Device continues to be used albeit at a low rate primarily with ventricular pacing.  Hence, now that she has reached EOS, we will proceed with device generator replacement  We have reviewed the benefits and risks of generator replacement.  These include but are not limited to lead fracture and infection.  The patient understands, agrees and is willing to proceed.     Blood pressure well-controlled on her atenolol.  Following device generator replacement we will plan to repeat change her antihypertensive regime so as to further try to minimize ventricular pacing

## 2023-07-07 NOTE — Progress Notes (Signed)
Patient ID: [name], female   DOB: 1950/09/20, 73 y.o.   MRN: [id number] Man    Electrophysiology Office Note   Date:  07/07/2023   ID:  [name], DOB 1951-05-15, MRN [id number]  PCP:  Marjie Skiff, NP  Cardiologist:  none Primary Electrophysiologist:      No chief complaint on file.     History of Present Illness: KAILEIGH DUXBURY is a 72 y.o. 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 for which she underwent pacing remotely w/ generator replacement Dec 2011    Hx of breast CA 2000's   Echo 06/2010 normal LV function    Her device has reached EOS, as she is not on remotes No change in symptoms  Neg sleep study in the past   Date   Cr  K Hgb   12/21 0.91 4.8  (2/21)13.4   6/22 1.10  5.1 (5/22) 10.4   3/23 1.13 4.6 13.1   10/24 1.12 4.3 13.7 (4/24)     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   Ovarian cyst 11/06/2020   Pacemaker [employer] 2004   Personal history of chemotherapy 2009   left breast ca   Personal history of radiation therapy 2009   left breast   Sinus tachycardia    Type 2 diabetes mellitus (HCC) 06/19/2015   Past Surgical History:  Procedure Laterality Date   ANKLE SURGERY Left    x 3   BREAST EXCISIONAL BIOPSY Left 2009    Stage IIa, ER/PR negative, 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,   BREAST LUMPECTOMY Left 2009   chemo and rad tx   CARPAL TUNNEL RELEASE     CESAREAN  SECTION  1983   CHOLECYSTECTOMY     COLONOSCOPY WITH PROPOFOL N/A 01/13/2023   Procedure: COLONOSCOPY WITH PROPOFOL;  Surgeon: Wyline Mood, MD;  Location: [facility] ENDOSCOPY;  Service: Gastroenterology;  Laterality: N/A;   EYE SURGERY Left 12/2018   cataracts-Dr.Shah in [place]    EYE SURGERY Right 01/2019   cataracts- Dr.Shah in [facility]   GALLBLADDER SURGERY  1993   INSERT / REPLACE / REMOVE PACEMAKER  2011   revision @ [facility]   PACEMAKER INSERTION  05/13/2003   Neoma Laming - DR   ROBOTIC ASSISTED SALPINGO OOPHERECTOMY Right 01/18/2021   Procedure: XI ROBOTIC ASSISTED RIGHT SALPINGO-OOPHORECTOMY;  Surgeon: Natale Milch, MD;  Location: [facility] ORS;  Service: Gynecology;  Laterality: Right;   Allergies:   Penicillins, Levofloxacin, and Other   Physical Exam: BP 114/64   Pulse 68   Ht 5' 3.5" (1.613 m)   Wt 220 lb 3.2 oz (99.9 kg)   SpO2 97%   BMI 38.39 kg/m  Well developed and well nourished in no acute distress HENT normal Neck supple with JVP-flat Clear Device pocket well healed; without hematoma or erythema.  There is no tethering  Regular rate and rhythm, no  gallop No murmur Abd-soft with active BS No Clubbing cyanosis  edema Skin-warm and dry A & Oriented  Grossly normal sensory and motor function  ECG sinus @ 68 23/08/40  Device function is at EOS Programming changes none   See Paceart for details    ASSESSMENT AND PLAN: Dyspnea on Exertion/HFpEF  1 AV block--intermittent  Hypertension  Diabetes  Hypomagnesemia  Pacemaker [employer]     Vpacing decreased 30>>>>6%   Device continues to be used albeit at a low rate primarily with ventricular pacing.  Hence, now that she has reached EOS, we will proceed with device generator replacement  We have reviewed the benefits and risks of generator replacement.  These include but are not limited to lead fracture and infection.  The patient understands, agrees and is willing to proceed.     Blood pressure well-controlled on her atenolol.  Following device generator replacement we will plan to repeat change her antihypertensive regime so as to further try to minimize ventricular pacing

## 2023-07-07 NOTE — Patient Instructions (Addendum)
Medication Instructions:  The current medical regimen is effective;  continue present plan and medications.  *If you need a refill on your cardiac medications before your next appointment, please call your pharmacy*   Lab Work: Your provider would like for you to have following labs drawn today CBC, BMET.   If you have labs (blood work) drawn today and your tests are completely normal, you will receive your results only by: MyChart Message (if you have MyChart) OR A paper copy in the mail If you have any lab test that is abnormal or we need to change your treatment, we will call you to review the results.   Testing/Procedures: PPM GEN CHANGE   Follow-Up: At [facility], you and your health needs are our priority.  As part of our continuing mission to provide you with exceptional heart care, we have created designated Provider Care Teams.  These Care Teams include your primary Cardiologist (physician) and Advanced Practice Providers (APPs -  Physician Assistants and Nurse Practitioners) who all work together to provide you with the care you need, when you need it.  We recommend signing up for the patient portal called "MyChart".  Sign up information is provided on this After Visit Summary.  MyChart is used to connect with patients for Virtual Visits (Telemedicine).  Patients are able to view lab/test results, encounter notes, upcoming appointments, etc.  Non-urgent messages can be sent to your provider as well.   To learn more about what you can do with MyChart, go to [email or web address].    Your next appointment:   They will call you for follow up appointments.   Other Instructions See instruction letter.

## 2023-07-08 LAB — CBC
Hematocrit: 43.5 % (ref 34.0–46.6)
Hemoglobin: 14.4 g/dL (ref 11.1–15.9)
MCH: 29.3 pg (ref 26.6–33.0)
MCHC: 33.1 g/dL (ref 31.5–35.7)
MCV: 88 fL (ref 79–97)
Platelets: 178 10*3/uL (ref 150–450)
RBC: 4.92 x10E6/uL (ref 3.77–5.28)
RDW: 13.3 % (ref 11.7–15.4)
WBC: 6.7 10*3/uL (ref 3.4–10.8)

## 2023-07-08 LAB — BASIC METABOLIC PANEL
BUN/Creatinine Ratio: 23 (ref 12–28)
BUN: 23 mg/dL (ref 8–27)
CO2: 25 mmol/L (ref 20–29)
Calcium: 9.7 mg/dL (ref 8.7–10.3)
Chloride: 103 mmol/L (ref 96–106)
Creatinine, Ser: 0.99 mg/dL (ref 0.57–1.00)
Glucose: 97 mg/dL (ref 70–99)
Potassium: 4.6 mmol/L (ref 3.5–5.2)
Sodium: 145 mmol/L — ABNORMAL HIGH (ref 134–144)
eGFR: 60 mL/min/{1.73_m2} (ref >59)

## 2023-07-09 LAB — CUP PACEART INCLINIC DEVICE CHECK
Date Time Interrogation Session: 20241111094314
Implantable Lead Connection Status: 753985
Implantable Lead Connection Status: 753985
Implantable Lead Implant Date: 20040917
Implantable Lead Implant Date: 20040917
Implantable Lead Location: 753859
Implantable Lead Location: 753860
Implantable Lead Model: 4479
Implantable Lead Model: 5076
Implantable Lead Serial Number: 312732
Implantable Pulse Generator Implant Date: 20111209
Pulse Gen Serial Number: 595359

## 2023-07-15 DIAGNOSIS — H3589 Other specified retinal disorders: Secondary | ICD-10-CM | POA: Diagnosis not present

## 2023-07-15 DIAGNOSIS — H35033 Hypertensive retinopathy, bilateral: Secondary | ICD-10-CM | POA: Diagnosis not present

## 2023-07-15 DIAGNOSIS — I10 Essential (primary) hypertension: Secondary | ICD-10-CM | POA: Diagnosis not present

## 2023-07-15 DIAGNOSIS — H35372 Puckering of macula, left eye: Secondary | ICD-10-CM | POA: Diagnosis not present

## 2023-07-15 DIAGNOSIS — E119 Type 2 diabetes mellitus without complications: Secondary | ICD-10-CM | POA: Diagnosis not present

## 2023-07-31 ENCOUNTER — Encounter
Admission: RE | Disposition: A | Discharge status: Home / Self Care | Payer: Self-pay | Source: Ambulatory Visit | Attending: Internal Medicine

## 2023-07-31 ENCOUNTER — Other Ambulatory Visit: Payer: Self-pay

## 2023-07-31 ENCOUNTER — Encounter: Payer: Self-pay | Admitting: Internal Medicine

## 2023-07-31 ENCOUNTER — Ambulatory Visit
Admission: RE | Admit: 2023-07-31 | Discharge: 2023-07-31 | Disposition: A | Discharge status: Home / Self Care | Payer: Medicare PPO | Source: Ambulatory Visit | Attending: Internal Medicine | Admitting: Internal Medicine

## 2023-07-31 DIAGNOSIS — I440 Atrioventricular block, first degree: Secondary | ICD-10-CM | POA: Diagnosis not present

## 2023-07-31 DIAGNOSIS — Z45010 Encounter for checking and testing of cardiac pacemaker pulse generator [battery]: Secondary | ICD-10-CM | POA: Insufficient documentation

## 2023-07-31 DIAGNOSIS — Z853 Personal history of malignant neoplasm of breast: Secondary | ICD-10-CM | POA: Insufficient documentation

## 2023-07-31 DIAGNOSIS — R0609 Other forms of dyspnea: Secondary | ICD-10-CM | POA: Insufficient documentation

## 2023-07-31 DIAGNOSIS — Z45018 Encounter for adjustment and management of other part of cardiac pacemaker: Secondary | ICD-10-CM | POA: Diagnosis not present

## 2023-07-31 DIAGNOSIS — E119 Type 2 diabetes mellitus without complications: Secondary | ICD-10-CM | POA: Insufficient documentation

## 2023-07-31 DIAGNOSIS — I5032 Chronic diastolic (congestive) heart failure: Secondary | ICD-10-CM | POA: Insufficient documentation

## 2023-07-31 DIAGNOSIS — I110 Hypertensive heart disease with heart failure: Secondary | ICD-10-CM | POA: Insufficient documentation

## 2023-07-31 DIAGNOSIS — Z79899 Other long term (current) drug therapy: Secondary | ICD-10-CM | POA: Diagnosis not present

## 2023-07-31 HISTORY — PX: PPM GENERATOR CHANGEOUT: EP1233

## 2023-07-31 LAB — GLUCOSE, CAPILLARY: Glucose-Capillary: 126 mg/dL — ABNORMAL HIGH (ref 70–99)

## 2023-07-31 SURGERY — PPM GENERATOR CHANGEOUT
Anesthesia: Moderate Sedation

## 2023-07-31 MED ORDER — MIDAZOLAM HCL 2 MG/2ML IJ SOLN
INTRAMUSCULAR | Status: DC | PRN
  Administered 2023-07-31: 1 mg via INTRAVENOUS
  Administered 2023-07-31: 2 mg via INTRAVENOUS
  Administered 2023-07-31: 1 mg via INTRAVENOUS

## 2023-07-31 MED ORDER — LIDOCAINE HCL 1 % IJ SOLN
INTRAMUSCULAR | Status: AC
  Filled 2023-07-31: qty 60

## 2023-07-31 MED ORDER — CHLORHEXIDINE GLUCONATE 4 % EX SOLN
4.0000 | Freq: Once | CUTANEOUS | Status: AC
  Administered 2023-07-31: 4 via TOPICAL

## 2023-07-31 MED ORDER — SODIUM CHLORIDE 0.9 % IV SOLN: INTRAVENOUS | Status: DC

## 2023-07-31 MED ORDER — HEPARIN (PORCINE) IN NACL 1000-0.9 UT/500ML-% IV SOLN
INTRAVENOUS | Status: DC | PRN
  Administered 2023-07-31: 500 mL

## 2023-07-31 MED ORDER — VANCOMYCIN HCL IN DEXTROSE 1-5 GM/200ML-% IV SOLN: 1000.0000 mg | Freq: Once | INTRAVENOUS | Status: DC

## 2023-07-31 MED ORDER — MIDAZOLAM HCL 2 MG/2ML IJ SOLN
INTRAMUSCULAR | Status: AC
  Filled 2023-07-31: qty 2

## 2023-07-31 MED ORDER — SODIUM CHLORIDE 0.9 % IV SOLN
INTRAVENOUS | Status: DC | PRN
  Administered 2023-07-31: 80 mg

## 2023-07-31 MED ORDER — SODIUM CHLORIDE 0.9 % IV SOLN
80.0000 mg | INTRAVENOUS | Status: DC
  Filled 2023-07-31: qty 2

## 2023-07-31 MED ORDER — FENTANYL CITRATE (PF) 100 MCG/2ML IJ SOLN
INTRAMUSCULAR | Status: AC
  Filled 2023-07-31: qty 2

## 2023-07-31 MED ORDER — VANCOMYCIN HCL IN DEXTROSE 1-5 GM/200ML-% IV SOLN
INTRAVENOUS | Status: AC
  Filled 2023-07-31: qty 200

## 2023-07-31 MED ORDER — HEPARIN (PORCINE) IN NACL 1000-0.9 UT/500ML-% IV SOLN
INTRAVENOUS | Status: AC
  Filled 2023-07-31: qty 1000

## 2023-07-31 MED ORDER — CEFAZOLIN SODIUM-DEXTROSE 2-4 GM/100ML-% IV SOLN: 2.0000 g | INTRAVENOUS | Status: DC

## 2023-07-31 MED ORDER — LIDOCAINE HCL 1 % IJ SOLN
INTRAMUSCULAR | Status: AC
  Filled 2023-07-31: qty 20

## 2023-07-31 MED ORDER — SODIUM CHLORIDE 0.9 % IV SOLN: INTRAVENOUS | Status: AC

## 2023-07-31 MED ORDER — VANCOMYCIN HCL IN DEXTROSE 1-5 GM/200ML-% IV SOLN
INTRAVENOUS | Status: DC | PRN
  Administered 2023-07-31: 1000 mg via INTRAVENOUS

## 2023-07-31 MED ORDER — LIDOCAINE HCL (PF) 1 % IJ SOLN
INTRAMUSCULAR | Status: DC | PRN
  Administered 2023-07-31: 60 mg

## 2023-07-31 MED ORDER — ACETAMINOPHEN 325 MG PO TABS: 325.0000 mg | ORAL_TABLET | ORAL | Status: DC | PRN

## 2023-07-31 MED ORDER — FENTANYL CITRATE (PF) 100 MCG/2ML IJ SOLN
INTRAMUSCULAR | Status: DC | PRN
  Administered 2023-07-31: 25 ug via INTRAVENOUS
  Administered 2023-07-31: 50 ug via INTRAVENOUS
  Administered 2023-07-31: 25 ug via INTRAVENOUS

## 2023-07-31 SURGICAL SUPPLY — 12 items
CABLE SURG 12 DISP A/V CHANNEL (MISCELLANEOUS) IMPLANT
DERMABOND ADVANCED .7 DNX12 (GAUZE/BANDAGES/DRESSINGS) IMPLANT
DEVICE DSSCT PLSMBLD 3.0S LGHT (MISCELLANEOUS) IMPLANT
HEMOSTAT SURGICEL 2X4 FIBR (HEMOSTASIS) IMPLANT
PACEMAKER ACCOLADE GR (Pacemaker) IMPLANT
PAD ELECT DEFIB RADIOL ZOLL (MISCELLANEOUS) IMPLANT
PLASMABLADE 3.0S W/LIGHT (MISCELLANEOUS) ×1
POUCH AIGIS-R ANTIBACT PPM (Mesh General) ×1 IMPLANT
POUCH AIGIS-R ANTIBACT PPM MED (Mesh General) IMPLANT
SUT VIC AB 2-0 CT2 27 (SUTURE) IMPLANT
SUT VIC AB 3-0 X1 27 (SUTURE) IMPLANT
TRAY PACEMAKER INSERTION (PACKS) ×1 IMPLANT

## 2023-07-31 NOTE — Interval H&P Note (Signed)
History and Physical Interval Note:  07/31/2023 7:50 AM  [name]  has presented today for surgery, with the diagnosis of Pacemaker generator change out   [place]  Dual  Battery end of life.  The various methods of treatment have been discussed with the patient and family. After consideration of risks, benefits and other options for treatment, the patient has consented to  Procedure(s): PPM GENERATOR CHANGEOUT (N/A) as a surgical intervention.  The patient's history has been reviewed, patient examined, no change in status, stable for surgery.  I have reviewed the patient's chart and labs.  Questions were answered to the patient's satisfaction.     Sherryl Manges  Pen allergy?>> anaphylaxis  >> will avoid 1st generation cephalosporin

## 2023-07-31 NOTE — Progress Notes (Signed)
Dr. Graciela Husbands in the room with patient.

## 2023-08-04 ENCOUNTER — Encounter: Payer: Self-pay | Admitting: Internal Medicine

## 2023-08-14 DIAGNOSIS — M5033 Other cervical disc degeneration, cervicothoracic region: Secondary | ICD-10-CM | POA: Diagnosis not present

## 2023-08-14 DIAGNOSIS — M62830 Muscle spasm of back: Secondary | ICD-10-CM | POA: Diagnosis not present

## 2023-08-14 DIAGNOSIS — M9901 Segmental and somatic dysfunction of cervical region: Secondary | ICD-10-CM | POA: Diagnosis not present

## 2023-08-14 DIAGNOSIS — M9903 Segmental and somatic dysfunction of lumbar region: Secondary | ICD-10-CM | POA: Diagnosis not present

## 2023-08-23 NOTE — Patient Instructions (Addendum)
 Alpha Lipoic Acid  Be Involved in Caring For Your Health:  Taking Medications When medications are taken as directed, they can greatly improve your health. But if they are not taken as prescribed, they may not work. In some cases, not taking them correctly can be harmful. To help ensure your treatment remains effective and safe, understand your medications and how to take them. Bring your medications to each visit for review by your provider.  Your lab results, notes, and after visit summary will be available on My Chart. We strongly encourage you to use this feature. If lab results are abnormal the clinic will contact you with the appropriate steps. If the clinic does not contact you assume the results are satisfactory. You can always view your results on My Chart. If you have questions regarding your health or results, please contact the clinic during office hours. You can also ask questions on My Chart.  We at [facility] are grateful that you chose us  to provide your care. We strive to provide evidence-based and compassionate care and are always looking for feedback. If you get a survey from the clinic please complete this so we can hear your opinions.  Diabetes Mellitus and Foot Care Diabetes, also called diabetes mellitus, may cause problems with your feet and legs because of poor blood flow (circulation). Poor circulation may make your skin: Become thinner and drier. Break more easily. Heal more slowly. Peel and crack. You may also have nerve damage (neuropathy). This can cause decreased feeling in your legs and feet. This means that you may not notice minor injuries to your feet that could lead to more serious problems. Finding and treating problems early is the best way to prevent future foot problems. How to care for your feet Foot hygiene  Wash your feet daily with warm water and mild soap. Do not use hot water. Then, pat your feet and the areas between your toes until  they are fully dry. Do not soak your feet. This can dry your skin. Trim your toenails straight across. Do not dig under them or around the cuticle. File the edges of your nails with an emery board or nail file. Apply a moisturizing lotion or petroleum jelly to the skin on your feet and to dry, brittle toenails. Use lotion that does not contain alcohol and is unscented. Do not apply lotion between your toes. Shoes and socks Wear clean socks or stockings every day. Make sure they are not too tight. Do not wear knee-high stockings. These may decrease blood flow to your legs. Wear shoes that fit well and have enough cushioning. Always look in your shoes before you put them on to be sure there are no objects inside. To break in new shoes, wear them for just a few hours a day. This prevents injuries on your feet. Wounds, scrapes, corns, and calluses  Check your feet daily for blisters, cuts, bruises, sores, and redness. If you cannot see the bottom of your feet, use a mirror or ask someone for help. Do not cut off corns or calluses or try to remove them with medicine. If you find a minor scrape, cut, or break in the skin on your feet, keep it and the skin around it clean and dry. You may clean these areas with mild soap and water. Do not clean the area with peroxide, alcohol, or iodine . If you have a wound, scrape, corn, or callus on your foot, look at it several times a day  to make sure it is healing and not infected. Check for: Redness, swelling, or pain. Fluid or blood. Warmth. Pus or a bad smell. General tips Do not cross your legs. This may decrease blood flow to your feet. Do not use heating pads or hot water bottles on your feet. They may burn your skin. If you have lost feeling in your feet or legs, you may not know this is happening until it is too late. Protect your feet from hot and cold by wearing shoes, such as at the beach or on hot pavement. Schedule a complete foot exam at least once a  year or more often if you have foot problems. Report any cuts, sores, or bruises to your health care provider right away. Where to find more information American Diabetes Association: diabetes.org Association of Diabetes Care & Education Specialists: diabeteseducator.org Contact a health care provider if: You have a condition that increases your risk of infection, and you have any cuts, sores, or bruises on your feet. You have an injury that is not healing. You have redness on your legs or feet. You feel burning or tingling in your legs or feet. You have pain or cramps in your legs and feet. Your legs or feet are numb. Your feet always feel cold. You have pain around any toenails. Get help right away if: You have a wound, scrape, corn, or callus on your foot and: You have signs of infection. You have a fever. You have a red line going up your leg. This information is not intended to replace advice given to you by your health care provider. Make sure you discuss any questions you have with your health care provider. Document Revised: 02/13/2022 Document Reviewed: 02/13/2022 Elsevier Patient Education  2024 [employer].

## 2023-08-24 ENCOUNTER — Other Ambulatory Visit: Payer: Self-pay | Admitting: Nurse Practitioner

## 2023-08-24 DIAGNOSIS — I152 Hypertension secondary to endocrine disorders: Secondary | ICD-10-CM

## 2023-08-25 ENCOUNTER — Ambulatory Visit: Payer: Medicare PPO | Admitting: Emergency Medicine

## 2023-08-25 VITALS — Ht 63.5 in | Wt 215.0 lb

## 2023-08-25 DIAGNOSIS — Z0000 Encounter for general adult medical examination without abnormal findings: Secondary | ICD-10-CM

## 2023-08-25 NOTE — Progress Notes (Signed)
Subjective:   [name] is a 72 y.o. female who presents for Medicare Annual (Subsequent) preventive examination.  Visit Complete: Virtual I connected with  Robyne Peers on 08/25/23 by a audio enabled telemedicine application and verified that I am speaking with the correct person using two identifiers.  Patient Location: Home  Provider Location: Office/Clinic  I discussed the limitations of evaluation and management by telemedicine. The patient expressed understanding and agreed to proceed.  Vital Signs: Because this visit was a virtual/telehealth visit, some criteria may be missing or patient reported. Any vitals not documented were not able to be obtained and vitals that have been documented are patient reported.  Patient Medicare AWV questionnaire was completed by the patient on 08/24/23; I have confirmed that all information answered by patient is correct and no changes since this date.  Cardiac Risk Factors include: advanced age (>22men, >67 women);hypertension;dyslipidemia;diabetes mellitus;obesity (BMI >30kg/m2)     Objective:    Today's Vitals   08/25/23 1126 08/25/23 1127  Weight: 215 lb (97.5 kg)   Height: 5' 3.5" (1.613 m)   PainSc:  4    Body mass index is 37.49 kg/m.     08/25/2023   11:43 AM 01/13/2023    9:04 AM 06/25/2021    2:34 PM 05/29/2021    2:07 PM 01/18/2021    8:03 AM 01/10/2021   12:04 PM 12/06/2020   11:24 AM  Advanced Directives  Does Patient Have a Medical Advance Directive? Yes Yes Yes Yes Yes Yes Yes  Type of Estate agent of [place];Living will Healthcare Power of [place];Living will Healthcare Power of [place];Living will  Healthcare Power of [place];Living will    Does patient want to make changes to medical advance directive? No - Patient declined   No - Patient declined No - Patient declined  Yes (ED - Information included in AVS)  Copy of Healthcare Power of Attorney in Chart? No - copy requested No - copy  requested No - copy requested      Would patient like information on creating a medical advance directive?    No - Patient declined       Current Medications (verified) Outpatient Encounter Medications as of 08/25/2023  Medication Sig   acetaminophen (TYLENOL) 500 MG tablet Take 1,000 mg by mouth every 6 (six) hours as needed for moderate pain (pain score 4-6).   atenolol (TENORMIN) 50 MG tablet Take 1 tablet (50 mg total) by mouth daily.   atorvastatin (LIPITOR) 40 MG tablet Take 1 tablet (40 mg total) by mouth daily.   Cholecalciferol (VITAMIN D3) 50 MCG (2000 UT) TABS Take 1 tablet by mouth daily.   CINNAMON PO Take 2 capsules by mouth in the morning.   CRANBERRY PO Take 15,000 mg by mouth daily.   cyanocobalamin (VITAMIN B12) 1000 MCG tablet Take 1,000 mcg by mouth every other day.   empagliflozin (JARDIANCE) 25 MG TABS tablet Take 1 tablet (25 mg total) by mouth daily before breakfast.   esomeprazole (NEXIUM) 20 MG capsule Take 20 mg by mouth every morning.   fluticasone (FLONASE) 50 MCG/ACT nasal spray Place 2 sprays into both nostrils daily.   glucose blood (ACCU-CHEK AVIVA PLUS) test strip TEST BLOOD SUGAR ONCE DAILY   ibuprofen (ADVIL) 200 MG tablet Take 400 mg by mouth every 6 (six) hours as needed for moderate pain (pain score 4-6).   levocetirizine (XYZAL) 5 MG tablet Take 5 mg by mouth every evening.   Magnesium Cl-Calcium Carbonate (  SLOW-MAG PO) Take 2 tablets by mouth daily.   montelukast (SINGULAIR) 10 MG tablet Take 1 tablet (10 mg total) by mouth at bedtime.   Multiple Vitamins-Minerals (PRESERVISION AREDS 2) CAPS Take by mouth 2 (two) times daily.   nystatin (MYCOSTATIN/NYSTOP) powder Apply 1 Application topically 2 (two) times daily. (Patient taking differently: Apply 1 Application topically 2 (two) times daily as needed (rash).)   pregabalin (LYRICA) 50 MG capsule Take 1 capsule (50 mg total) by mouth 3 (three) times daily.   pyridOXINE (VITAMIN B6) 100 MG tablet Take  100 mg by mouth 2 (two) times daily.   tiZANidine (ZANAFLEX) 2 MG tablet Take 1 tablet (2 mg total) by mouth every 8 (eight) hours as needed for muscle spasms.   albuterol (VENTOLIN HFA) 108 (90 Base) MCG/ACT inhaler Inhale 2 puffs into the lungs every 6 (six) hours as needed for wheezing or shortness of breath. (Patient not taking: Reported on 08/25/2023)   BITTER MELON PO Take 2,500 mg by mouth daily. (Patient not taking: Reported on 08/25/2023)   Multiple Vitamins-Minerals (PRESERVISION AREDS 2 PO) Take 1 capsule by mouth 2 (two) times daily. (Patient not taking: Reported on 08/25/2023)   SUMAtriptan (IMITREX) 100 MG tablet Take 1 tablet (100 mg total) by mouth as needed. (Patient not taking: Reported on 08/25/2023)   No facility-administered encounter medications on file as of 08/25/2023.    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   Ovarian cyst 11/06/2020   Pacemaker [employer] 2004   Personal history of chemotherapy 2009   left breast ca   Personal history of radiation therapy 2009   left breast   Sinus tachycardia    Type 2 diabetes mellitus (HCC) 06/19/2015   Past Surgical History:  Procedure Laterality Date   ANKLE SURGERY Left    x 3   BREAST EXCISIONAL BIOPSY Left 2009    Stage IIa, ER/PR negative, 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,   BREAST LUMPECTOMY Left 2009   chemo and rad tx   CARPAL  TUNNEL RELEASE     CESAREAN SECTION  1983   CHOLECYSTECTOMY     COLONOSCOPY WITH PROPOFOL N/A 01/13/2023   Procedure: COLONOSCOPY WITH PROPOFOL;  Surgeon: Wyline Mood, MD;  Location: [facility] ENDOSCOPY;  Service: Gastroenterology;  Laterality: N/A;   EYE SURGERY Left 12/2018   cataracts-Dr.Shah in [place]    EYE SURGERY Right 01/2019   cataracts- Dr[facility]Shah in [facility]   GALLBLADDER SURGERY  1993   INSERT / REPLACE / REMOVE PACEMAKER  2011   revision @ [facility]   PACEMAKER INSERTION  05/13/2003   [facility] Carollee Massed - DR   PPM GENERATOR CHANGEOUT N/A 07/31/2023   Procedure: PPM GENERATOR CHANGEOUT;  Surgeon: Duke Salvia, MD;  Location: [facility] INVASIVE CV LAB;  Service: Cardiovascular;  Laterality: N/A;   ROBOTIC ASSISTED SALPINGO OOPHERECTOMY Right  01/18/2021   Procedure: XI ROBOTIC ASSISTED RIGHT SALPINGO-OOPHORECTOMY;  Surgeon: Natale Milch, MD;  Location: [facility] ORS;  Service: Gynecology;  Laterality: Right;   Family History  Problem Relation Age of Onset   Diabetes Mother    Heart disease Mother    Thyroid disease Mother    Hypertension Father    Cancer Father    Alcohol abuse Father    Diabetes Father    Stroke Other        Family hx of CVA or stroke   Diabetes Brother    Diabetes Daughter    Diabetes Paternal Uncle    Diabetes Paternal Grandmother    Breast cancer Maternal Aunt    Social History   Socioeconomic History   Marital status: Divorced    Spouse name: Not on file   Number of children: 2   Years of education: Not on file   Highest education level: Bachelor's degree (e.g., BA, AB, BS)  Occupational History   Occupation: Full time    Employer: [employer]   Occupation: retired  Tobacco Use   Smoking status: Former    Current packs/day: 0.00    Average packs/day: 1 pack/day for 15.0 years (15.0 ttl pk-yrs)    Types: Cigarettes    Start date: 08/26/1966    Quit date: 08/26/1981    Years since quitting: 42.0   Smokeless tobacco: Never   Vaping Use   Vaping status: Never Used  Substance and Sexual Activity   Alcohol use: Not Currently   Drug use: No   Sexual activity: Not Currently  Other Topics Concern   Not on file  Social History Narrative   Not on file   Social Drivers of Health   Financial Resource Strain: Low Risk  (08/25/2023)   Overall Financial Resource Strain (CARDIA)    Difficulty of Paying Living Expenses: Not hard at all  Food Insecurity: No Food Insecurity (08/25/2023)   Hunger Vital Sign    Worried About Running Out of Food in the Last Year: Never true    Ran Out of Food in the Last Year: Never true  Transportation Needs: No Transportation Needs (08/25/2023)   PRAPARE - Administrator, Civil Service (Medical): No    Lack of Transportation (Non-Medical): No  Physical Activity: Insufficiently Active (08/25/2023)   Exercise Vital Sign    Days of Exercise per Week: 3 days    Minutes of Exercise per Session: 20 min  Stress: No Stress Concern Present (08/25/2023)   [employer] of Occupational Health - Occupational Stress Questionnaire    Feeling of Stress : Not at all  Social Connections: Moderately Integrated (08/25/2023)   Social Connection and Isolation Panel [NHANES]    Frequency of Communication with Friends and Family: More than three times a week    Frequency of Social Gatherings with Friends and Family: Once a week    Attends Religious Services: More than 4 times per year    Active Member of [employer] or Organizations: Yes    Attends Engineer, structural: More than 4 times per year    Marital Status: Divorced    Tobacco Counseling Counseling given: Not Answered   Clinical Intake:  Pre-visit preparation completed: Yes  Pain : 0-10 Pain Score: 4  Pain Type: Chronic pain Pain Location: Leg Pain Orientation: Left Pain Descriptors / Indicators: Aching     BMI - recorded: 37.49 Nutritional Status: BMI > 30  Obese Nutritional Risks: None Diabetes: Yes CBG  done?: No (FBS 113 per patient) Did pt. bring in CBG monitor from home?: No  How often do you need to have someone help you when you read instructions, pamphlets, or other written materials from your doctor or pharmacy?: 1 - Never  Interpreter Needed?: No  Information entered by :: Tora Kindred, CMA   Activities of Daily Living    08/25/2023   11:30 AM 08/24/2023   10:17 AM  In your present state of health, do you have any difficulty performing the following activities:  Hearing? 0 0  Vision? 0 0  Difficulty concentrating or making decisions? 0 0  Walking or climbing stairs? 0 0  Dressing or bathing? 0 0  Doing errands, shopping? 0 0  Preparing Food and eating ? N N  Using the Toilet? N N  In the past six months, have you accidently leaked urine? Y Y  Comment at night wears underwear with moisture barrier   Do you have problems with loss of bowel control? N N  Managing your Medications? N N  Managing your Finances? N N  Housekeeping or managing your Housekeeping? N N    Patient Care Team: Marjie Skiff, NP as PCP - General (Nurse Practitioner) Jeralyn Ruths, MD as Consulting Physician (Oncology) Duke Salvia, MD as Consulting Physician (Cardiology)  Indicate any recent Medical Services you may have received from other than [facility] providers in the past year (date may be approximate).     Assessment:   This is a routine wellness examination for Rylen.  Hearing/Vision screen Hearing Screening - Comments:: Denies hearing loss Vision Screening - Comments:: Gets eye exams   Goals Addressed             This Visit's Progress    Weight (lb) < 200 lb (90.7 kg)   215 lb (97.5 kg)     Depression Screen    08/25/2023   11:41 AM 05/28/2023    8:38 AM 02/25/2023    8:34 AM 11/26/2022    9:04 AM 08/16/2022    9:35 AM 05/16/2022    9:12 AM 02/13/2022    8:28 AM  PHQ 2/9 Scores  PHQ - 2 Score 0 0 0 0 0 0 0  PHQ- 9 Score  0 0 0 0 0 0    Fall Risk    08/25/2023    11:44 AM 08/24/2023   10:17 AM 08/08/2023    9:53 AM 05/28/2023    8:38 AM 02/25/2023    8:34 AM  Fall Risk   Falls in the past year? 0 0 0 0 0  Number falls in past yr: 0 0   0  Injury with Fall? 0 0   0  Risk for fall due to : No Fall Risks   No Fall Risks No Fall Risks  Follow up Falls prevention discussed   Falls prevention discussed Falls evaluation completed    MEDICARE RISK AT HOME: Medicare Risk at Home Any stairs in or around the home?: (Patient-Rptd) Yes If so, are there any without handrails?: (Patient-Rptd) No Home free of loose throw rugs in walkways, pet beds, electrical cords, etc?: (Patient-Rptd) No Adequate lighting in your home to reduce risk of falls?: (Patient-Rptd) Yes Life alert?: (Patient-Rptd) No Use of a cane, walker or w/c?: (Patient-Rptd) No Grab bars in the bathroom?: (Patient-Rptd) Yes Shower chair or bench in shower?: (Patient-Rptd) Yes Elevated toilet seat or a handicapped toilet?: (Patient-Rptd) Yes  TIMED UP AND GO:  Was the test performed?  No    Cognitive Function:        08/25/2023   11:44 AM 06/25/2021    2:36 PM 06/23/2020    3:44 PM 03/16/2018   10:12 AM  6CIT Screen  What Year? 0 points 0 points 0 points 0 points  What month? 0 points 0 points 0 points 0 points  What time? 0 points 0 points 0 points 0 points  Count back from 20 0 points 0 points 0 points 0 points  Months in reverse 0 points 0 points 0 points 0 points  Repeat phrase 0 points 0 points 0 points 0 points  Total Score 0 points 0 points 0 points 0 points    Immunizations Immunization History  Administered Date(s) Administered   Fluad Quad(high Dose 65+) 06/28/2019, 04/27/2020, 05/15/2021, 05/16/2022   Fluad Trivalent(High Dose 65+) 05/28/2023   Influenza, High Dose Seasonal PF 06/22/2018   Influenza,inj,Quad PF,6+ Mos 06/19/2015, 06/24/2016, 06/23/2017   PFIZER(Purple Top)SARS-COV-2 Vaccination 10/13/2019, 11/03/2019, 11/07/2020   Pfizer Covid-19 Vaccine  Bivalent Booster 43yrs & up 05/16/2021   Pneumococcal Conjugate-13 04/01/2016   Pneumococcal Polysaccharide-23 03/16/2018   Respiratory Syncytial Virus Vaccine,Recomb Aduvanted(Arexvy) 07/23/2022   Tdap 05/22/2011   Zoster Recombinant(Shingrix) 06/09/2020, 10/13/2020   Zoster, Live 06/28/2014    TDAP status: Due, Education has been provided regarding the importance of this vaccine. Advised may receive this vaccine at local pharmacy or Health Dept. Aware to provide a copy of the vaccination record if obtained from local pharmacy or Health Dept. Verbalized acceptance and understanding.  Flu Vaccine status: Up to date  Pneumococcal vaccine status: Up to date  Covid-19 vaccine status: Information provided on how to obtain vaccines.   Qualifies for Shingles Vaccine? Yes   Zostavax completed Yes   Shingrix Completed?: Yes  Screening Tests Health Maintenance  Topic Date Due   COVID-19 Vaccine (5 - 2024-25 season) 09/08/2023 (Originally 04/27/2023)   Diabetic kidney evaluation - Urine ACR  11/26/2023   HEMOGLOBIN A1C  11/26/2023   OPHTHALMOLOGY EXAM  01/21/2024   FOOT EXAM  05/27/2024   Diabetic kidney evaluation - eGFR measurement  07/06/2024   DEXA SCAN  08/16/2024   Medicare Annual Wellness (AWV)  08/27/2024   MAMMOGRAM  01/20/2025   Fecal DNA (Cologuard)  12/04/2025   Pneumonia Vaccine 36+ Years old  Completed   INFLUENZA VACCINE  Completed   Hepatitis C Screening  Completed   Zoster Vaccines- Shingrix  Completed   HPV VACCINES  Aged Out   DTaP/Tdap/Td  Discontinued    Health Maintenance  There are no preventive care reminders to display for this patient.  Colorectal cancer screening: No longer required. Last colon 01/13/23, no repeat per GI  Mammogram status: Completed 01/21/23. Repeat every year  Bone Density status: Completed 08/16/14. Results reflect: Bone density results: NORMAL. Repeat every 10 years.  Lung Cancer Screening: (Low Dose CT Chest recommended if Age  54-80 years, 20 pack-year currently smoking OR have quit w/in 15years.) does not qualify.   Lung Cancer Screening Referral: n/a  Additional Screening:  Hepatitis C Screening: does not qualify; Completed 07/29/16  Vision Screening: Recommended annual ophthalmology exams for early detection of glaucoma and other disorders of the eye. Is the patient up to date with their annual eye exam?  Yes  Who is the provider or what is the name of the office in which the patient attends annual eye exams?Dr. Soundra Pilon, [facility] If pt is not established with a provider, would they like to be referred  to a provider to establish care? No .   Dental Screening: Recommended annual dental exams for proper oral hygiene  Diabetic Foot Exam: Diabetic Foot Exam: Completed 05/28/23  Community Resource Referral / Chronic Care Management: CRR required this visit?  No   CCM required this visit?  No     Plan:     I have personally reviewed and noted the following in the patient's chart:   Medical and social history Use of alcohol, tobacco or illicit drugs  Current medications and supplements including opioid prescriptions. Patient is not currently taking opioid prescriptions. Functional ability and status Nutritional status Physical activity Advanced directives List of other physicians Hospitalizations, surgeries, and ER visits in previous 12 months Vitals Screenings to include cognitive, depression, and falls Referrals and appointments  In addition, I have reviewed and discussed with patient certain preventive protocols, quality metrics, and best practice recommendations. A written personalized care plan for preventive services as well as general preventive health recommendations were provided to patient.     Tora Kindred, CMA   08/25/2023   After Visit Summary: (MyChart) Due to this being a telephonic visit, the after visit summary with patients personalized plan was offered to patient via  MyChart   Nurse Notes:  Declined DM & Nutrition education Needs Tdap (last one showing from 08/16/22 shows incomplete) Wants to discuss need for Covid vaccine at OV on 08/28/23

## 2023-08-25 NOTE — Patient Instructions (Addendum)
[name] , Thank you for taking time to come for your Medicare Wellness Visit. I appreciate your ongoing commitment to your health goals. Please review the following plan we discussed and let me know if I can assist you in the future.   Referrals/Orders/Follow-Ups/Clinician Recommendations: Discuss with Aura Dials, NP if you need a covid vaccine. You are due for a tetanus shot. Call [facility] @ [phone] to schedule a yearly skin check.  This is a list of the screening recommended for you and due dates:  Health Maintenance  Topic Date Due   COVID-19 Vaccine (5 - 2024-25 season) 09/08/2023*   Yearly kidney health urinalysis for diabetes  11/26/2023   Hemoglobin A1C  11/26/2023   Mammogram  01/21/2024   Eye exam for diabetics  01/21/2024   Complete foot exam   05/27/2024   Yearly kidney function blood test for diabetes  07/06/2024   DEXA scan (bone density measurement)  08/16/2024   Medicare Annual Wellness Visit  08/27/2024   Cologuard (Stool DNA test)  12/04/2025   Pneumonia Vaccine  Completed   Flu Shot  Completed   Hepatitis C Screening  Completed   Zoster (Shingles) Vaccine  Completed   HPV Vaccine  Aged Out   DTaP/Tdap/Td vaccine  Discontinued  *Topic was postponed. The date shown is not the original due date.    Advanced directives: (Copy Requested) Please bring a copy of your health care power of attorney and living will to the office to be added to your chart at your convenience.  Next Medicare Annual Wellness Visit scheduled for next year: Yes, 08/31/24 @ 10:40am

## 2023-08-28 ENCOUNTER — Ambulatory Visit: Payer: Medicare PPO | Attending: Cardiovascular Disease

## 2023-08-28 ENCOUNTER — Ambulatory Visit: Payer: Medicare PPO | Admitting: Nurse Practitioner

## 2023-08-28 ENCOUNTER — Encounter: Payer: Self-pay | Admitting: Nurse Practitioner

## 2023-08-28 VITALS — BP 127/74 | HR 74 | Temp 97.7°F | Ht 63.5 in | Wt 218.8 lb

## 2023-08-28 DIAGNOSIS — Z0000 Encounter for general adult medical examination without abnormal findings: Secondary | ICD-10-CM

## 2023-08-28 DIAGNOSIS — E1169 Type 2 diabetes mellitus with other specified complication: Secondary | ICD-10-CM

## 2023-08-28 DIAGNOSIS — I440 Atrioventricular block, first degree: Secondary | ICD-10-CM | POA: Diagnosis not present

## 2023-08-28 DIAGNOSIS — N1831 Chronic kidney disease, stage 3a: Secondary | ICD-10-CM

## 2023-08-28 DIAGNOSIS — Z853 Personal history of malignant neoplasm of breast: Secondary | ICD-10-CM | POA: Diagnosis not present

## 2023-08-28 DIAGNOSIS — G620 Drug-induced polyneuropathy: Secondary | ICD-10-CM

## 2023-08-28 DIAGNOSIS — E785 Hyperlipidemia, unspecified: Secondary | ICD-10-CM

## 2023-08-28 DIAGNOSIS — Z6837 Body mass index (BMI) 37.0-37.9, adult: Secondary | ICD-10-CM

## 2023-08-28 DIAGNOSIS — E1159 Type 2 diabetes mellitus with other circulatory complications: Secondary | ICD-10-CM

## 2023-08-28 DIAGNOSIS — I152 Hypertension secondary to endocrine disorders: Secondary | ICD-10-CM | POA: Diagnosis not present

## 2023-08-28 DIAGNOSIS — E66812 Obesity, class 2: Secondary | ICD-10-CM

## 2023-08-28 DIAGNOSIS — E119 Type 2 diabetes mellitus without complications: Secondary | ICD-10-CM

## 2023-08-28 DIAGNOSIS — T451X5A Adverse effect of antineoplastic and immunosuppressive drugs, initial encounter: Secondary | ICD-10-CM

## 2023-08-28 LAB — BAYER DCA HB A1C WAIVED: HB A1C (BAYER DCA - WAIVED): 6.3 % — ABNORMAL HIGH (ref 4.8–5.6)

## 2023-08-28 LAB — CUP PACEART INCLINIC DEVICE CHECK
Date Time Interrogation Session: 20250102144936
Implantable Lead Connection Status: 753985
Implantable Lead Connection Status: 753985
Implantable Lead Implant Date: 20040917
Implantable Lead Implant Date: 20040917
Implantable Lead Location: 753859
Implantable Lead Location: 753860
Implantable Lead Model: 4479
Implantable Lead Model: 5076
Implantable Lead Serial Number: 312732
Implantable Pulse Generator Implant Date: 20241205
Lead Channel Impedance Value: 461 Ohm
Lead Channel Impedance Value: 486 Ohm
Lead Channel Pacing Threshold Amplitude: 0.6 V
Lead Channel Pacing Threshold Amplitude: 1 V
Lead Channel Pacing Threshold Pulse Width: 0.4 ms
Lead Channel Pacing Threshold Pulse Width: 0.4 ms
Lead Channel Sensing Intrinsic Amplitude: 18.6 mV
Lead Channel Sensing Intrinsic Amplitude: 2.6 mV
Lead Channel Setting Pacing Amplitude: 2 V
Lead Channel Setting Pacing Amplitude: 2.5 V
Lead Channel Setting Pacing Pulse Width: 0.4 ms
Lead Channel Setting Sensing Sensitivity: 2.5 mV
Pulse Gen Serial Number: 823153
Zone Setting Status: 755011

## 2023-08-28 MED ORDER — OZEMPIC (0.25 OR 0.5 MG/DOSE) 2 MG/1.5ML ~~LOC~~ SOPN: PEN_INJECTOR | SUBCUTANEOUS | 4 refills | Status: DC

## 2023-08-28 NOTE — Progress Notes (Addendum)
 BP 127/74   Pulse 74   Temp 97.7 F (36.5 C) (Oral)   Ht 5' 3.5 (1.613 m)   Wt 218 lb 12.8 oz (99.2 kg)   SpO2 97%   BMI 38.15 kg/m    Subjective:    Patient ID: [name], female    DOB: 24-Jul-1951, 73 y.o.   MRN: [id number]  HPI: [name] is a 73 y.o. female  Chief Complaint  Patient presents with   Hyperlipidemia   Hypertension   Diabetes   DIABETES A1c 6.4% March.  Taking Jardiance  25 MG daily. Previously took Metformin , but switched to SGLT2 for heart health, this has also benefited magnesium  levels which have been normal for almost 2 years.  She has been trying to lose weight for several months, >6 months, working on diet and exercise and can not get past 215 lbs.   Hypoglycemic episodes:no Polydipsia/polyuria: no Visual disturbance: no Chest pain: no Paresthesias: yes bilateral hands this is chronic Glucose Monitoring:   Accucheck frequency: Daily  Fasting glucose: 111 this morning, range 110 to 120  Post prandial:  Evening:  Before meals: Taking Insulin?: no  Long acting insulin:  Short acting insulin: Blood Pressure Monitoring: weekly Retinal Examination: Up To Date, [facility]  Foot Exam: Up To Date Pneumovax: Up to Date Influenza: Up to Date Aspirin: no   HYPERTENSION / HYPERLIPIDEMIA Follows with cardiology, last visit 07/07/23, to see annually. Has pacemaker, PPM [employer] -- new battery placed 07/31/23. Current medications: Atenolol , Atorvastatin , Slow Mag. Satisfied with current treatment? yes Duration of hypertension: chronic BP monitoring frequency: weekly BP range: <130/80 on average BP medication side effects: no Past BP meds:  Duration of hyperlipidemia: chronic Cholesterol medication side effects: no Cholesterol supplements: none Past cholesterol medications:  Medication compliance: excellent compliance Aspirin: no Recent stressors: yes Recurrent headaches: no Visual changes: no Palpitations: no Dyspnea: no Chest  pain: no Lower extremity edema: yes Dizzy/lightheaded: no   CHRONIC KIDNEY DISEASE CKD status: stable Medications renally dose: yes Previous renal evaluation: no Pneumovax:  Up to Date Influenza Vaccine:  Up to Date  NEUROPATHY (HANDS) -- HISTORY OF BREAST CA: Neuropathy and hypomagnesemia due to past cancer treatments, was followed by oncology in past, but transitioned to PCP for this.  Takes Lyrica  for neuropathy pain.  Received magnesium  infusions, however levels have been stable since 05/15/21 after starting Jardiance .  Hands have been bothering her more recently, with burning and swelling.  Issues with holding things due to this.   Neuropathy status: controlled  Satisfied with current treatment?: yes Medication side effects: no Medication compliance:  excellent compliance Location: hands Pain: yes Severity: moderate  Quality:  sharp, burning, tender, tingling, creeping, and pins and needles Frequency: intermittent -- can be constant Bilateral: yes Symmetric: yes Numbness: yes Decreased sensation: yes Weakness: yes Context: some worsening present Alleviating factors: Lyrica  Aggravating factors: unknown     08/28/2023    9:03 AM 08/25/2023   11:41 AM 05/28/2023    8:38 AM 02/25/2023    8:34 AM 11/26/2022    9:04 AM  Depression screen PHQ 2/9  Decreased Interest 0 0 0 0 0  Down, Depressed, Hopeless 0 0 0 0 0  PHQ - 2 Score 0 0 0 0 0  Altered sleeping 0  0 0 0  Tired, decreased energy 0  0 0 0  Change in appetite 0  0 0 0  Feeling bad or failure about yourself  0  0 0 0  Trouble concentrating 0  0 0 0  Moving slowly or fidgety/restless 0  0 0 0  Suicidal thoughts 0  0 0 0  PHQ-9 Score 0  0 0 0  Difficult doing work/chores Not difficult at all   Not difficult at all        08/28/2023    9:04 AM 05/28/2023    8:39 AM 02/25/2023    8:34 AM 11/26/2022    9:04 AM  GAD 7 : Generalized Anxiety Score  Nervous, Anxious, on Edge 0 0 0 0  Control/stop worrying 0 0 0 0  Worry too  much - different things 0 0 0 0  Trouble relaxing 0 0 0 0  Restless 0 0 0 0  Easily annoyed or irritable 0 0 0 0  Afraid - awful might happen 0 0 0 0  Total GAD 7 Score 0 0 0 0  Anxiety Difficulty Not difficult at all  Not difficult at all    Relevant past medical, surgical, family and social history reviewed and updated as indicated. Interim medical history since our last visit reviewed. Allergies and medications reviewed and updated.  Review of Systems  Constitutional:  Negative for activity change, appetite change, diaphoresis, fatigue and fever.  Respiratory:  Negative for cough, chest tightness and shortness of breath.   Cardiovascular:  Negative for chest pain, palpitations and leg swelling.  Gastrointestinal: Negative.   Neurological: Negative.   Psychiatric/Behavioral: Negative.      Per HPI unless specifically indicated above     Objective:    BP 127/74   Pulse 74   Temp 97.7 F (36.5 C) (Oral)   Ht 5' 3.5 (1.613 m)   Wt 218 lb 12.8 oz (99.2 kg)   SpO2 97%   BMI 38.15 kg/m   Wt Readings from Last 3 Encounters:  08/28/23 218 lb 12.8 oz (99.2 kg)  08/25/23 215 lb (97.5 kg)  07/31/23 219 lb 9.3 oz (99.6 kg)    Physical Exam Vitals and nursing note reviewed.  Constitutional:      General: She is awake. She is not in acute distress.    Appearance: She is well-developed and well-groomed. She is obese. She is not ill-appearing or toxic-appearing.  HENT:     Head: Normocephalic.     Right Ear: Hearing, ear canal and external ear normal.     Left Ear: Hearing, ear canal and external ear normal.     Mouth/Throat:     Mouth: Mucous membranes are moist.  Eyes:     General: Lids are normal.        Right eye: No discharge.        Left eye: No discharge.     Conjunctiva/sclera: Conjunctivae normal.     Pupils: Pupils are equal, round, and reactive to light.  Neck:     Thyroid: No thyromegaly.     Vascular: No carotid bruit.  Cardiovascular:     Rate and Rhythm:  Normal rate and regular rhythm.     Heart sounds: Normal heart sounds. No murmur heard.    No gallop.  Pulmonary:     Effort: Pulmonary effort is normal. No accessory muscle usage or respiratory distress.     Breath sounds: Normal breath sounds.  Abdominal:     General: Bowel sounds are normal.     Palpations: Abdomen is soft.  Musculoskeletal:     Cervical back: Normal range of motion and neck supple.     Right lower leg: No edema.  Left lower leg: No edema.  Lymphadenopathy:     Cervical: No cervical adenopathy.  Skin:    General: Skin is warm and dry.  Neurological:     Mental Status: She is alert and oriented to person, place, and time.     Cranial Nerves: Cranial nerves 2-12 are intact.  Psychiatric:        Attention and Perception: Attention normal.        Mood and Affect: Mood normal.        Speech: Speech normal.        Behavior: Behavior normal. Behavior is cooperative.        Thought Content: Thought content normal.    Results for orders placed or performed in visit on 08/28/23  Bayer DCA Hb A1c Waived   Collection Time: 08/28/23  8:54 AM  Result Value Ref Range   HB A1C (BAYER DCA - WAIVED) 6.3 (H) 4.8 - 5.6 %  Basic metabolic panel   Collection Time: 08/28/23  8:54 AM  Result Value Ref Range   Glucose 118 (H) 70 - 99 mg/dL   BUN 20 8 - 27 mg/dL   Creatinine, Ser 8.86 (H) 0.57 - 1.00 mg/dL   eGFR 51 (L) >40 fO/fpw/8.26   BUN/Creatinine Ratio 18 12 - 28   Sodium 145 (H) 134 - 144 mmol/L   Potassium 4.1 3.5 - 5.2 mmol/L   Chloride 104 96 - 106 mmol/L   CO2 25 20 - 29 mmol/L   Calcium  9.7 8.7 - 10.3 mg/dL  Lipid Panel w/o Chol/HDL Ratio   Collection Time: 08/28/23  8:54 AM  Result Value Ref Range   Cholesterol, Total 143 100 - 199 mg/dL   Triglycerides 862 0 - 149 mg/dL   HDL 47 >60 mg/dL   VLDL Cholesterol Cal 24 5 - 40 mg/dL   LDL Chol Calc (NIH) 72 0 - 99 mg/dL  Magnesium    Collection Time: 08/28/23  8:54 AM  Result Value Ref Range   Magnesium   1.5 (L) 1.6 - 2.3 mg/dL      Assessment & Plan:   Problem List Items Addressed This Visit       Cardiovascular and Mediastinum   Hypertension associated with diabetes (HCC)   Chronic, stable.  BP at goal at office and home.  Continue current medication regimen and adjust as needed + continue collaboration with cardiology.  Recent note reviewed.  LABS: BMP.  Urine ALB 24 December 2022.  Recommend she continue to monitor BP at home and focus on DASH diet.        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 Urine ALB 24 December 2022, recheck next visit. Will continue Jardiance  25 MG daily which has helped her magnesium  levels and diabetes + add on Ozempic  starting at 0.25 MG/0.5 MG for both diabetes and weight loss.  Educated her at length on this medication. No family history of thyroid cancer (MTC, MEN 2, thyroid cell tumors) or pancreatitis. Continue to focus heavily on diabetic diet and exercise.  Check sugars at home at least 3 days a week. - Eye and foot exams up to date - Statin on board, consider ACE or ARB in future - Vaccinations up  to date      Relevant Medications   Semaglutide ,0.25 or 0.5MG /DOS, (OZEMPIC , 0.25 OR 0.5 MG/DOSE,) 2 MG/1.5ML SOPN   Other Relevant Orders   Bayer DCA Hb A1c Waived (Completed)   Diabetes mellitus treated with oral medication (HCC)   Refer to Diabetes plan of care for further, taking Farxiga.      Relevant Medications   Semaglutide ,0.25 or 0.5MG /DOS, (OZEMPIC , 0.25 OR 0.5 MG/DOSE,) 2 MG/1.5ML SOPN   Hyperlipidemia associated with type 2 diabetes mellitus (HCC)   Chronic, ongoing.  Continue current medication regimen and adjust as needed.  Lipid panel today.      Relevant  Medications   Semaglutide ,0.25 or 0.5MG /DOS, (OZEMPIC , 0.25 OR 0.5 MG/DOSE,) 2 MG/1.5ML SOPN   Other Relevant Orders   Bayer DCA Hb A1c Waived (Completed)   Lipid Panel w/o Chol/HDL Ratio (Completed)     Nervous and Auditory   Chemotherapy-induced neuropathy (HCC)   Chronic, ongoing.  Continue Lyrica  and adjust as needed, especially with aging monitor dose closely.  Refills up to date.  She does not wish to go up on dosing at this time, although hands are having more symptoms.  Consider neurology or PT in the future.        Genitourinary   Chronic kidney disease, stage 3a (HCC)   Chronic, ongoing.  Stable levels at this time with no decline.  Ensure plenty of hydration daily and continue Farxiga.        Other   History of breast cancer   Continue collaboration with oncology as needed.  Annual mammograms.      Hypomagnesemia   Ongoing and stable.  Recent level normal. Continue amiloride  and follow up with oncology as needed. Magnesium  rechecked today. Has had stable levels since being on Jardiance , since 05/15/21.      Relevant Orders   Magnesium  (Completed)   Obesity   BMI 38.15 with T2DM and HTN -- is working on weight loss.  Recommended eating smaller high protein, low fat meals more frequently and exercising 30 mins a day 5 times a week with a goal of 10-15 lb weight loss in the next 3 months. Patient voiced their understanding and motivation to adhere to these recommendations.       Relevant Medications   Semaglutide ,0.25 or 0.5MG /DOS, (OZEMPIC , 0.25 OR 0.5 MG/DOSE,) 2 MG/1.5ML SOPN     Follow up plan: Return in about 3 months (around 11/26/2023) for Annual physical and diabetes check after 11/26/23.

## 2023-08-28 NOTE — Patient Instructions (Signed)
 After Your Pacemaker   Monitor your pacemaker site for redness, swelling, and drainage. Call the device clinic at [phone] if you experience these symptoms or fever/chills.  Your incision was closed with Dermabond:  You may shower 1 day after your defibrillator implant and wash your incision with soap and water. Avoid lotions, ointments, or perfumes over your incision until it is well-healed.  You may use a hot tub or a pool after your wound check appointment if the incision is completely closed.  There are no restrictions in arm movement after your wound check appointment.  Remote monitoring is used to monitor your pacemaker from home. This monitoring is scheduled every 91 days by our office. It allows [place] to keep an eye on the functioning of your device to ensure it is working properly. You will routinely see your Electrophysiologist annually (more often if necessary).

## 2023-08-28 NOTE — Assessment & Plan Note (Signed)
 Chronic, ongoing.  Continue Lyrica and adjust as needed, especially with aging monitor dose closely.  Refills up to date.  She does not wish to go up on dosing at this time, although hands are having more symptoms.  Consider neurology or PT in the future.

## 2023-08-28 NOTE — Assessment & Plan Note (Signed)
 BMI 38.15 with T2DM and HTN -- is working on weight loss.  Recommended eating smaller high protein, low fat meals more frequently and exercising 30 mins a day 5 times a week with a goal of 10-15 lb weight loss in the next 3 months. Patient voiced their understanding and motivation to adhere to these recommendations.

## 2023-08-28 NOTE — Telephone Encounter (Signed)
 Requested Prescriptions  Pending Prescriptions Disp Refills   atenolol  (TENORMIN ) 50 MG tablet [Pharmacy Med Name: ATENOLOL  50 MG TABLET] 90 tablet 0    Sig: Take 1 tablet (50 mg total) by mouth daily.     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)   [place] [facility] [place], [place] T, NP   3 months ago 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

## 2023-08-28 NOTE — Progress Notes (Signed)
 Wound check appointment.  Dermabond removed by Pt prior to appt. Wound without redness or edema. Incision edges approximated, wound well healed. Normal device function. Thresholds, sensing, and impedances consistent with implant measurements. Device programmed at chronic settings post gen change. Histogram distribution appropriate for patient and level of activity. Brief NSVT. Patient educated about wound care. ROV in 3 months with implanting physician.

## 2023-08-28 NOTE — Assessment & Plan Note (Signed)
 Chronic, ongoing.  Continue current medication regimen and adjust as needed. Lipid panel today.

## 2023-08-28 NOTE — Assessment & Plan Note (Signed)
Ongoing and stable.  Recent level normal. Continue amiloride and follow up with oncology as needed. Magnesium rechecked today. Has had stable levels since being on Jardiance, since 05/15/21.

## 2023-08-28 NOTE — Assessment & Plan Note (Signed)
Continue collaboration with oncology as needed.  Annual mammograms. 

## 2023-08-28 NOTE — Assessment & Plan Note (Signed)
 Chronic, stable.  BP at goal at office and home.  Continue current medication regimen and adjust as needed + continue collaboration with cardiology.  Recent note reviewed.  LABS: BMP.  Urine ALB 24 December 2022.  Recommend she continue to monitor BP at home and focus on DASH diet.

## 2023-08-28 NOTE — Assessment & Plan Note (Addendum)
 Chronic, stable with A1c 6.3% today. Urine ALB 24 December 2022, recheck next visit. Will continue Jardiance  25 MG daily which has helped her magnesium  levels and diabetes + add on Ozempic  starting at 0.25 MG/0.5 MG for both diabetes and weight loss.  Educated her at length on this medication. No family history of thyroid cancer (MTC, MEN 2, thyroid cell tumors) or pancreatitis. Continue to focus heavily on diabetic diet and exercise.  Check sugars at home at least 3 days a week. - Eye and foot exams up to date - Statin on board, consider ACE or ARB in future - Vaccinations up to date

## 2023-08-29 ENCOUNTER — Encounter: Payer: Self-pay | Admitting: Nurse Practitioner

## 2023-08-29 ENCOUNTER — Other Ambulatory Visit: Payer: Self-pay | Admitting: Nurse Practitioner

## 2023-08-29 ENCOUNTER — Ambulatory Visit: Payer: Self-pay | Admitting: *Deleted

## 2023-08-29 DIAGNOSIS — N1831 Chronic kidney disease, stage 3a: Secondary | ICD-10-CM | POA: Insufficient documentation

## 2023-08-29 LAB — BASIC METABOLIC PANEL
BUN/Creatinine Ratio: 18 (ref 12–28)
BUN: 20 mg/dL (ref 8–27)
CO2: 25 mmol/L (ref 20–29)
Calcium: 9.7 mg/dL (ref 8.7–10.3)
Chloride: 104 mmol/L (ref 96–106)
Creatinine, Ser: 1.13 mg/dL — ABNORMAL HIGH (ref 0.57–1.00)
Glucose: 118 mg/dL — ABNORMAL HIGH (ref 70–99)
Potassium: 4.1 mmol/L (ref 3.5–5.2)
Sodium: 145 mmol/L — ABNORMAL HIGH (ref 134–144)
eGFR: 51 mL/min/{1.73_m2} — ABNORMAL LOW (ref >59)

## 2023-08-29 LAB — LIPID PANEL W/O CHOL/HDL RATIO
Cholesterol, Total: 143 mg/dL (ref 100–199)
HDL: 47 mg/dL (ref >39)
LDL Chol Calc (NIH): 72 mg/dL (ref 0–99)
Triglycerides: 137 mg/dL (ref 0–149)
VLDL Cholesterol Cal: 24 mg/dL (ref 5–40)

## 2023-08-29 LAB — MAGNESIUM: Magnesium: 1.5 mg/dL — ABNORMAL LOW (ref 1.6–2.3)

## 2023-08-29 NOTE — Telephone Encounter (Signed)
  Chief Complaint: AN/A Pertinent Negatives: Patient denies N/A Disposition: [] ED /[] Urgent Care (no appt availability in office) / [] Appointment(In office/virtual)/ []  [place] [facility]/ [x] Home Care/ [] Refused Recommended Disposition /[] [place] [facility]/ []  Follow-up with PCP Additional Notes: Lab appt scheduled

## 2023-08-29 NOTE — Telephone Encounter (Signed)
   Reason for Disposition  [1] Follow-up call to recent contact AND [2] information only call, no triage required    MyChart notifications coming in.  Answer Assessment - Initial Assessment Questions 1. REASON FOR CALL or QUESTION: What is your reason for calling today? or How can I best help you? or What question do you have that I can help answer?     Pt called in because she was getting alerts via her MyChart.   It was where Jolene [name], NP was entering orders for her mag and sodium to be rechecked in 4 weeks.   Made her an appt for lab work to be done in 4 weeks.  Protocols used: Information Only Call - No Triage-A-AH

## 2023-08-29 NOTE — Progress Notes (Signed)
 Pt already has a lab appointment for 4 weeks on 09/30/2023 @ 8:20 am.

## 2023-08-29 NOTE — Progress Notes (Signed)
 Needs lab only visit in 4 weeks please

## 2023-08-29 NOTE — Progress Notes (Signed)
 Contacted via MyChart   Good morning [name], your labs have returned: - Kidney function, creatinine and eGFR, is showing a little drop again.  Similar to previous labs with chronic kidney disease stage 3a.  Ensure good water intake at home and no Ibuprofen  use. - Sodium, salt, level is a little elevated still.  Reduce salt intake and increase water intake. - Magnesium  level trended down, which it has not done since 2022.  Ensure you are taking Magnesium  supplement daily and continue Jardiance .  I would like to recheck this in 4 weeks outpatient.   - Lipid panel is stable.  Any questions? Keep being amazing!!  Thank you for allowing me to participate in your care.  I appreciate you. Kindest regards, Layna Roeper

## 2023-09-02 ENCOUNTER — Ambulatory Visit: Payer: Medicare PPO | Admitting: Student

## 2023-09-09 DIAGNOSIS — E119 Type 2 diabetes mellitus without complications: Secondary | ICD-10-CM | POA: Insufficient documentation

## 2023-09-09 NOTE — Assessment & Plan Note (Signed)
 Chronic, ongoing.  Stable levels at this time with no decline.  Ensure plenty of hydration daily and continue [place].

## 2023-09-09 NOTE — Assessment & Plan Note (Signed)
 Refer to Diabetes plan of care for further, taking [place].

## 2023-09-30 ENCOUNTER — Other Ambulatory Visit: Payer: Medicare PPO

## 2023-10-01 ENCOUNTER — Encounter: Payer: Self-pay | Admitting: Nurse Practitioner

## 2023-10-01 LAB — SODIUM: Sodium: 147 mmol/L — ABNORMAL HIGH (ref 134–144)

## 2023-10-01 LAB — MAGNESIUM: Magnesium: 1.8 mg/dL (ref 1.6–2.3)

## 2023-10-07 ENCOUNTER — Other Ambulatory Visit: Payer: Self-pay | Admitting: Nurse Practitioner

## 2023-10-07 NOTE — Telephone Encounter (Signed)
Requested medication (s) are due for refill today: yes  Requested medication (s) are on the active medication list: yes  Last refill:  06/23/23 #30/5  Future visit scheduled: yes  Notes to clinic:  Unable to refill per protocol, cannot delegate.    Requested Prescriptions  Pending Prescriptions Disp Refills   tiZANidine (ZANAFLEX) 2 MG tablet [Pharmacy Med Name: TIZANIDINE HCL 2 MG TABLET] 30 tablet 0    Sig: Take 1 tablet (2 mg total) by mouth every 8 (eight) hours as needed for muscle spasms.     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

## 2023-10-23 ENCOUNTER — Ambulatory Visit (INDEPENDENT_AMBULATORY_CARE_PROVIDER_SITE_OTHER): Payer: Medicare PPO | Admitting: Dermatology

## 2023-10-23 ENCOUNTER — Encounter: Payer: Self-pay | Admitting: Dermatology

## 2023-10-23 DIAGNOSIS — L814 Other melanin hyperpigmentation: Secondary | ICD-10-CM | POA: Diagnosis not present

## 2023-10-23 DIAGNOSIS — M9901 Segmental and somatic dysfunction of cervical region: Secondary | ICD-10-CM | POA: Diagnosis not present

## 2023-10-23 DIAGNOSIS — W908XXA Exposure to other nonionizing radiation, initial encounter: Secondary | ICD-10-CM

## 2023-10-23 DIAGNOSIS — Z1283 Encounter for screening for malignant neoplasm of skin: Secondary | ICD-10-CM

## 2023-10-23 DIAGNOSIS — L578 Other skin changes due to chronic exposure to nonionizing radiation: Secondary | ICD-10-CM | POA: Diagnosis not present

## 2023-10-23 DIAGNOSIS — D229 Melanocytic nevi, unspecified: Secondary | ICD-10-CM

## 2023-10-23 DIAGNOSIS — L720 Epidermal cyst: Secondary | ICD-10-CM

## 2023-10-23 DIAGNOSIS — D1801 Hemangioma of skin and subcutaneous tissue: Secondary | ICD-10-CM | POA: Diagnosis not present

## 2023-10-23 DIAGNOSIS — L821 Other seborrheic keratosis: Secondary | ICD-10-CM

## 2023-10-23 DIAGNOSIS — M5033 Other cervical disc degeneration, cervicothoracic region: Secondary | ICD-10-CM | POA: Diagnosis not present

## 2023-10-23 DIAGNOSIS — M9903 Segmental and somatic dysfunction of lumbar region: Secondary | ICD-10-CM | POA: Diagnosis not present

## 2023-10-23 DIAGNOSIS — M62830 Muscle spasm of back: Secondary | ICD-10-CM | POA: Diagnosis not present

## 2023-10-23 NOTE — Patient Instructions (Addendum)

## 2023-10-23 NOTE — Progress Notes (Signed)
   Follow-Up Visit   Subjective  [name] is a 73 y.o. female who presents for the following: Skin Cancer Screening and Full Body Skin Exam, no hx of skin cancer, not sure if fhx of skin cancer, check spot L flank, itchy recently  The patient presents for Total-Body Skin Exam (TBSE) for skin cancer screening and mole check. The patient has spots, moles and lesions to be evaluated, some may be new or changing and the patient may have concern these could be cancer.  Exam of nails limited by presence of nail polish.   The following portions of the chart were reviewed this encounter and updated as appropriate: medications, allergies, medical history  Review of Systems:  No other skin or systemic complaints except as noted in HPI or Assessment and Plan.  Objective  Well appearing patient in no apparent distress; mood and affect are within normal limits.  A full examination was performed including scalp, head, eyes, ears, nose, lips, neck, chest, axillae, abdomen, back, buttocks, bilateral upper extremities, bilateral lower extremities, hands, feet, fingers, toes, fingernails, and toenails. All findings within normal limits unless otherwise noted below.   Relevant physical exam findings are noted in the Assessment and Plan.    Assessment & Plan   SKIN CANCER SCREENING PERFORMED TODAY.  ACTINIC DAMAGE - Chronic condition, secondary to cumulative UV/sun exposure - diffuse scaly erythematous macules with underlying dyspigmentation - Recommend daily broad spectrum sunscreen SPF 30+ to sun-exposed areas, reapply every 2 hours as needed.  - Staying in the shade or wearing long sleeves, sun glasses (UVA+UVB protection) and wide brim hats (4-inch brim around the entire circumference of the hat) are also recommended for sun protection.  - Call for new or changing lesions.  LENTIGINES, SEBORRHEIC KERATOSES, HEMANGIOMAS - Benign normal skin lesions - Benign-appearing - Call for any  changes  MELANOCYTIC NEVI - Tan-brown and/or pink-flesh-colored symmetric macules and papules - Benign appearing on exam today - Observation - Call clinic for new or changing moles - Recommend daily use of broad spectrum spf 30+ sunscreen to sun-exposed areas.   SEBORRHEIC KERATOSIS L flank Exam: stuck on waxy papule L flank  Treatment:  Benign-appearing.  Observation.  Call clinic for new or changing lesions.  Recommend daily use of broad spectrum spf 30+ sunscreen to sun-exposed areas.    EPIDERMAL INCLUSION CYST upper back paraspinal Exam: Subcutaneous nodule at R upper back paraspinal  Benign-appearing. Exam most consistent with an epidermal inclusion cyst. Discussed that a cyst is a benign growth that can grow over time and sometimes get irritated or inflamed. Recommend observation if it is not bothersome. Discussed option of surgical excision to remove it if it is growing, symptomatic, or other changes noted. Please call for new or changing lesions so they can be evaluated.     Return in about 1 year (around 10/22/2024) for TBSE.  I, Ardis Rowan, RMA, am acting as scribe for Elie Goody, MD .   Documentation: I have reviewed the above documentation for accuracy and completeness, and I agree with the above.  Elie Goody, MD

## 2023-10-30 NOTE — Progress Notes (Signed)
 Electrophysiology Clinic Note    Date:  10/31/2023  Patient ID:  [name], [name] February 23, 1951, MRN [id number] PCP:  Marjie Skiff, NP  Cardiologist:  None Electrophysiologist: Sherryl Manges, MD   Discussed the use of AI scribe software for clinical note transcription with the patient, who gave verbal consent to proceed.   Patient Profile    Chief Complaint: routine device follow-up  History of Present Illness: [name] is a 73 y.o. 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  She is s/p gen change 07/2023.  On follow-up today, she is doing well from a cardiac standpoint, no concerns with her PPM. She recently started ozempic for weight loss and BG control. She denies chest pain, chest pressure, SOB. She has slowly increased activity with weight loss and plans to continue to increase her activity. Her goal is < 200lb for overall health improvement.    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 All other systems are reviewed and otherwise negative.    Physical Exam    VS:  BP 120/68 (BP Location: Left Arm, Patient Position: Sitting, Cuff Size: Normal)   Pulse 85   Ht 5' 3.5" (1.613 m)   Wt 215 lb 2 oz (97.6 kg)   SpO2 94%   BMI 37.51 kg/m  BMI: Body mass index is 37.51 kg/m.  Wt Readings from Last 3 Encounters:  10/31/23 215 lb 2 oz (97.6 kg)  08/28/23 218 lb 12.8 oz (99.2 kg)  08/25/23 215 lb (97.5 kg)     GEN- The patient is well appearing, alert and oriented x 3 today.   Lungs- Clear to ausculation bilaterally, normal work of breathing.  Heart- Regular rate and rhythm, no murmurs, rubs or gallops Extremities- Trace peripheral edema, warm, dry Skin-  device pocket well-healed, no tethering   Device interrogation done today and  reviewed by myself:  Battery 9 years Lead thresholds, impedence, sensing stable  Low AP and VP No episodes Good HR histograms NO changes made today   Studies Reviewed   Previous EP, cardiology notes.    EKG is ordered. 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   The patient does not have concerns regarding her medicines.  The following changes were made today:  none  Labs/ tests ordered today include:  Orders Placed This Encounter  Procedures   EKG 12-Lead     Disposition: Follow up with Dr. Jimmey Ralph or EP APP in 12 months, preferrably MD to establish care with  new MD after Dr. Odessa Fleming retirement   Signed, Sherie Don, NP  10/31/23  3:19 PM  Electrophysiology [facility]

## 2023-10-31 ENCOUNTER — Encounter: Payer: Self-pay | Admitting: Cardiology

## 2023-10-31 ENCOUNTER — Ambulatory Visit: Payer: Medicare PPO | Attending: Cardiology | Admitting: Cardiology

## 2023-10-31 VITALS — BP 120/68 | HR 85 | Ht 63.5 in | Wt 215.1 lb

## 2023-10-31 DIAGNOSIS — I440 Atrioventricular block, first degree: Secondary | ICD-10-CM | POA: Diagnosis not present

## 2023-10-31 DIAGNOSIS — Z950 Presence of cardiac pacemaker: Secondary | ICD-10-CM

## 2023-10-31 LAB — CUP PACEART INCLINIC DEVICE CHECK
Date Time Interrogation Session: 20250307152746
Implantable Lead Connection Status: 753985
Implantable Lead Connection Status: 753985
Implantable Lead Implant Date: 20040917
Implantable Lead Implant Date: 20040917
Implantable Lead Location: 753859
Implantable Lead Location: 753860
Implantable Lead Model: 4479
Implantable Lead Model: 5076
Implantable Lead Serial Number: 312732
Implantable Pulse Generator Implant Date: 20241205
Pulse Gen Serial Number: 823153

## 2023-10-31 NOTE — Patient Instructions (Addendum)
 Medication Instructions:  The current medical regimen is effective;  continue present plan and medications.  *If you need a refill on your cardiac medications before your next appointment, please call your pharmacy*   Follow-Up: At [facility], you and your health needs are our priority.  As part of our continuing mission to provide you with exceptional heart care, we have created designated Provider Care Teams.  These Care Teams include your primary Cardiologist (physician) and Advanced Practice Providers (APPs -  Physician Assistants and Nurse Practitioners) who all work together to provide you with the care you need, when you need it.  We recommend signing up for the patient portal called "MyChart".  Sign up information is provided on this After Visit Summary.  MyChart is used to connect with patients for Virtual Visits (Telemedicine).  Patients are able to view lab/test results, encounter notes, upcoming appointments, etc.  Non-urgent messages can be sent to your provider as well.   To learn more about what you can do with MyChart, go to [email or web address].    Your next appointment:   12 month(s)  Provider:   Nobie Putnam, MD or Sherie Don, NP

## 2023-11-03 ENCOUNTER — Other Ambulatory Visit: Payer: Self-pay | Admitting: Nurse Practitioner

## 2023-11-04 NOTE — Telephone Encounter (Signed)
 Requested medication (s) are due for refill today: yes  Requested medication (s) are on the active medication list: yes  Last refill:  10/07/23 #30/0  Future visit scheduled: yes  Notes to clinic:  Unable to refill per protocol, cannot delegate.    Requested Prescriptions  Pending Prescriptions Disp Refills   tiZANidine (ZANAFLEX) 2 MG tablet [Pharmacy Med Name: TIZANIDINE HCL 2 MG TABLET] 30 tablet 0    Sig: Take 1 tablet (2 mg total) by mouth every 8 (eight) hours as needed for muscle spasms.     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

## 2023-11-07 ENCOUNTER — Ambulatory Visit: Payer: Medicare PPO | Attending: Internal Medicine

## 2023-11-07 DIAGNOSIS — I440 Atrioventricular block, first degree: Secondary | ICD-10-CM

## 2023-11-11 LAB — CUP PACEART REMOTE DEVICE CHECK
Battery Remaining Longevity: 102 mo
Battery Remaining Percentage: 100 %
Brady Statistic RA Percent Paced: 0 %
Brady Statistic RV Percent Paced: 17 %
Date Time Interrogation Session: 20250314001000
Implantable Lead Connection Status: 753985
Implantable Lead Connection Status: 753985
Implantable Lead Implant Date: 20040917
Implantable Lead Implant Date: 20040917
Implantable Lead Location: 753859
Implantable Lead Location: 753860
Implantable Lead Model: 4479
Implantable Lead Model: 5076
Implantable Lead Serial Number: 312732
Implantable Pulse Generator Implant Date: 20241205
Lead Channel Impedance Value: 480 Ohm
Lead Channel Impedance Value: 502 Ohm
Lead Channel Pacing Threshold Amplitude: 0.6 V
Lead Channel Pacing Threshold Amplitude: 1.2 V
Lead Channel Pacing Threshold Pulse Width: 0.4 ms
Lead Channel Pacing Threshold Pulse Width: 0.4 ms
Lead Channel Setting Pacing Amplitude: 2 V
Lead Channel Setting Pacing Amplitude: 2.5 V
Lead Channel Setting Pacing Pulse Width: 0.4 ms
Lead Channel Setting Sensing Sensitivity: 2.5 mV
Pulse Gen Serial Number: 823153
Zone Setting Status: 755011

## 2023-11-13 ENCOUNTER — Ambulatory Visit: Payer: Medicare PPO | Admitting: Internal Medicine

## 2023-11-19 ENCOUNTER — Other Ambulatory Visit: Payer: Self-pay | Admitting: Nurse Practitioner

## 2023-11-19 DIAGNOSIS — G620 Drug-induced polyneuropathy: Secondary | ICD-10-CM

## 2023-11-19 NOTE — Telephone Encounter (Signed)
 Requested medication (s) are due for refill today - yes  Requested medication (s) are on the active medication list -yes  Future visit scheduled -yes  Last refill: 05/28/23 #270 1RF  Notes to clinic: non delegated Rx  Requested Prescriptions  Pending Prescriptions Disp Refills   pregabalin (LYRICA) 50 MG capsule [Pharmacy Med Name: PREGABALIN 50 MG CAPSULE] 270 capsule 0    Sig: Take 1 capsule (50 mg total) by mouth 3 (three) times daily.     Not Delegated - Neurology:  Anticonvulsants - Controlled - pregabalin Failed - 11/19/2023  3:43 PM      Failed - This refill cannot be delegated      Failed - Cr in normal range and within 360 days    Creatinine, Ser  Date Value Ref Range Status  08/28/2023 1.13 (H) 0.57 - 1.00 mg/dL Final         Failed - Valid encounter within last 12 months    Recent Outpatient Visits   None     Future Appointments             In 2 weeks Marjie Skiff, NP [name]   In 11 months Elie Goody, MD [facility]            Passed - Completed PHQ-2 or PHQ-9 in the last 360 days         Requested Prescriptions  Pending Prescriptions Disp Refills   pregabalin (LYRICA) 50 MG capsule [Pharmacy Med Name: PREGABALIN 50 MG CAPSULE] 270 capsule 0    Sig: Take 1 capsule (50 mg total) by mouth 3 (three) times daily.     Not Delegated - Neurology:  Anticonvulsants - Controlled - pregabalin Failed - 11/19/2023  3:43 PM      Failed - This refill cannot be delegated      Failed - Cr in normal range and within 360 days    Creatinine, Ser  Date Value Ref Range Status  08/28/2023 1.13 (H) 0.57 - 1.00 mg/dL Final         Failed - Valid encounter within last 12 months    Recent Outpatient Visits   None     Future Appointments             In 2 weeks Marjie Skiff, NP [facility], [name]   In 11 months Elie Goody, MD [facility]             Passed - Completed PHQ-2 or PHQ-9 in the last 360 days

## 2023-11-24 NOTE — Patient Instructions (Signed)
 Be Involved in Caring For Your Health:  Taking Medications When medications are taken as directed, they can greatly improve your health. But if they are not taken as prescribed, they may not work. In some cases, not taking them correctly can be harmful. To help ensure your treatment remains effective and safe, understand your medications and how to take them. Bring your medications to each visit for review by your provider.  Your lab results, notes, and after visit summary will be available on My Chart. We strongly encourage you to use this feature. If lab results are abnormal the clinic will contact you with the appropriate steps. If the clinic does not contact you assume the results are satisfactory. You can always view your results on My Chart. If you have questions regarding your health or results, please contact the clinic during office hours. You can also ask questions on My Chart.  We at [facility] are grateful that you chose [place] to provide your care. We strive to provide evidence-based and compassionate care and are always looking for feedback. If you get a survey from the clinic please complete this so we can hear your opinions.  Diabetes Mellitus and Foot Care Diabetes, also called diabetes mellitus, may cause problems with your feet and legs because of poor blood flow (circulation). Poor circulation may make your skin: Become thinner and drier. Break more easily. Heal more slowly. Peel and crack. You may also have nerve damage (neuropathy). This can cause decreased feeling in your legs and feet. This means that you may not notice minor injuries to your feet that could lead to more serious problems. Finding and treating problems early is the best way to prevent future foot problems. How to care for your feet Foot hygiene  Wash your feet daily with warm water and mild soap. Do not use hot water. Then, pat your feet and the areas between your toes until they are fully dry. Do  not soak your feet. This can dry your skin. Trim your toenails straight across. Do not dig under them or around the cuticle. File the edges of your nails with an emery board or nail file. Apply a moisturizing lotion or petroleum jelly to the skin on your feet and to dry, brittle toenails. Use lotion that does not contain alcohol and is unscented. Do not apply lotion between your toes. Shoes and socks Wear clean socks or stockings every day. Make sure they are not too tight. Do not wear knee-high stockings. These may decrease blood flow to your legs. Wear shoes that fit well and have enough cushioning. Always look in your shoes before you put them on to be sure there are no objects inside. To break in new shoes, wear them for just a few hours a day. This prevents injuries on your feet. Wounds, scrapes, corns, and calluses  Check your feet daily for blisters, cuts, bruises, sores, and redness. If you cannot see the bottom of your feet, use a mirror or ask someone for help. Do not cut off corns or calluses or try to remove them with medicine. If you find a minor scrape, cut, or break in the skin on your feet, keep it and the skin around it clean and dry. You may clean these areas with mild soap and water. Do not clean the area with peroxide, alcohol, or iodine. If you have a wound, scrape, corn, or callus on your foot, look at it several times a day to make sure it  is healing and not infected. Check for: Redness, swelling, or pain. Fluid or blood. Warmth. Pus or a bad smell. General tips Do not cross your legs. This may decrease blood flow to your feet. Do not use heating pads or hot water bottles on your feet. They may burn your skin. If you have lost feeling in your feet or legs, you may not know this is happening until it is too late. Protect your feet from hot and cold by wearing shoes, such as at the beach or on hot pavement. Schedule a complete foot exam at least once a year or more often if  you have foot problems. Report any cuts, sores, or bruises to your health care provider right away. Where to find more information American Diabetes Association: diabetes.org Association of Diabetes Care & Education Specialists: diabeteseducator.org Contact a health care provider if: You have a condition that increases your risk of infection, and you have any cuts, sores, or bruises on your feet. You have an injury that is not healing. You have redness on your legs or feet. You feel burning or tingling in your legs or feet. You have pain or cramps in your legs and feet. Your legs or feet are numb. Your feet always feel cold. You have pain around any toenails. Get help right away if: You have a wound, scrape, corn, or callus on your foot and: You have signs of infection. You have a fever. You have a red line going up your leg. This information is not intended to replace advice given to you by your health care provider. Make sure you discuss any questions you have with your health care provider. Document Revised: 02/13/2022 Document Reviewed: 02/13/2022 Elsevier Patient Education  2024 [employer].

## 2023-12-03 ENCOUNTER — Encounter: Payer: Self-pay | Admitting: Nurse Practitioner

## 2023-12-03 ENCOUNTER — Ambulatory Visit (INDEPENDENT_AMBULATORY_CARE_PROVIDER_SITE_OTHER): Payer: Self-pay | Admitting: Nurse Practitioner

## 2023-12-03 VITALS — BP 132/76 | HR 90 | Temp 97.8°F | Ht 63.4 in | Wt 210.0 lb

## 2023-12-03 DIAGNOSIS — I152 Hypertension secondary to endocrine disorders: Secondary | ICD-10-CM

## 2023-12-03 DIAGNOSIS — G620 Drug-induced polyneuropathy: Secondary | ICD-10-CM

## 2023-12-03 DIAGNOSIS — R7989 Other specified abnormal findings of blood chemistry: Secondary | ICD-10-CM

## 2023-12-03 DIAGNOSIS — Z6837 Body mass index (BMI) 37.0-37.9, adult: Secondary | ICD-10-CM

## 2023-12-03 DIAGNOSIS — Z853 Personal history of malignant neoplasm of breast: Secondary | ICD-10-CM

## 2023-12-03 DIAGNOSIS — E66812 Obesity, class 2: Secondary | ICD-10-CM

## 2023-12-03 DIAGNOSIS — E1159 Type 2 diabetes mellitus with other circulatory complications: Secondary | ICD-10-CM

## 2023-12-03 DIAGNOSIS — Z7984 Long term (current) use of oral hypoglycemic drugs: Secondary | ICD-10-CM

## 2023-12-03 DIAGNOSIS — E119 Type 2 diabetes mellitus without complications: Secondary | ICD-10-CM | POA: Diagnosis not present

## 2023-12-03 DIAGNOSIS — E559 Vitamin D deficiency, unspecified: Secondary | ICD-10-CM

## 2023-12-03 DIAGNOSIS — N1831 Chronic kidney disease, stage 3a: Secondary | ICD-10-CM

## 2023-12-03 DIAGNOSIS — E1169 Type 2 diabetes mellitus with other specified complication: Secondary | ICD-10-CM

## 2023-12-03 DIAGNOSIS — Z0000 Encounter for general adult medical examination without abnormal findings: Secondary | ICD-10-CM | POA: Diagnosis not present

## 2023-12-03 DIAGNOSIS — E538 Deficiency of other specified B group vitamins: Secondary | ICD-10-CM

## 2023-12-03 DIAGNOSIS — M79642 Pain in left hand: Secondary | ICD-10-CM

## 2023-12-03 LAB — MICROALBUMIN, URINE WAIVED
Creatinine, Urine Waived: 100 mg/dL (ref 10–300)
Microalb, Ur Waived: 30 mg/L — ABNORMAL HIGH (ref 0–19)
Microalb/Creat Ratio: <30 mg/g (ref <30)

## 2023-12-03 LAB — BAYER DCA HB A1C WAIVED: HB A1C (BAYER DCA - WAIVED): 5.6 % (ref 4.8–5.6)

## 2023-12-03 MED ORDER — MONTELUKAST SODIUM 10 MG PO TABS: 10.0000 mg | ORAL_TABLET | Freq: Every day | ORAL | 4 refills | Status: AC

## 2023-12-03 MED ORDER — EMPAGLIFLOZIN 25 MG PO TABS: 25.0000 mg | ORAL_TABLET | Freq: Every day | ORAL | 4 refills | Status: AC

## 2023-12-03 MED ORDER — PREGABALIN 50 MG PO CAPS: 50.0000 mg | ORAL_CAPSULE | Freq: Three times a day (TID) | ORAL | 1 refills | Status: DC

## 2023-12-03 MED ORDER — ATORVASTATIN CALCIUM 40 MG PO TABS: 40.0000 mg | ORAL_TABLET | Freq: Every day | ORAL | 4 refills | Status: AC

## 2023-12-03 MED ORDER — ATENOLOL 50 MG PO TABS: 50.0000 mg | ORAL_TABLET | Freq: Every day | ORAL | 3 refills | Status: AC

## 2023-12-03 MED ORDER — OZEMPIC (0.25 OR 0.5 MG/DOSE) 2 MG/1.5ML ~~LOC~~ SOPN: 0.5000 mg | PEN_INJECTOR | SUBCUTANEOUS | 4 refills | Status: DC

## 2023-12-03 NOTE — Assessment & Plan Note (Signed)
Ongoing and stable.  Recent level normal. Continue amiloride and follow up with oncology as needed. Magnesium rechecked today. Has had stable levels since being on Jardiance, since 05/15/21.

## 2023-12-03 NOTE — Assessment & Plan Note (Signed)
 BMI 36.73 with T2DM and HTN, has lost 8 lbs with Ozempic.  Recommended eating smaller high protein, low fat meals more frequently and exercising 30 mins a day 5 times a week with a goal of 10-15 lb weight loss in the next 3 months. Patient voiced their understanding and motivation to adhere to these recommendations.

## 2023-12-03 NOTE — Assessment & Plan Note (Signed)
 Chronic, ongoing.  Continue current medication regimen and adjust as needed. Lipid panel today.

## 2023-12-03 NOTE — Assessment & Plan Note (Addendum)
 Chronic, stable.  BP at goal at office and home.  Continue current medication regimen and adjust as needed + continue collaboration with cardiology.  Recent note reviewed.  LABS: CBC, CMP. TSH.  Urine ALB 24 December 2023.  Recommend she continue to monitor BP at home and focus on DASH diet.

## 2023-12-03 NOTE — Assessment & Plan Note (Signed)
Noted on recent labs with downward trend to normal range, no symptoms.  Recheck today.  Initiate medication as needed.

## 2023-12-03 NOTE — Assessment & Plan Note (Addendum)
 Refer to Diabetes plan of care for further, taking Jardiance.

## 2023-12-03 NOTE — Assessment & Plan Note (Signed)
 Chronic, stable with A1c 5.6% today. Urine ALB 24 December 2023. Will continue Jardiance 25 MG daily which has helped her magnesium levels and diabetes + Ozempic 0.5 MG weekly which is helping with weight loss.  Educated her at length on this medication. No family history of thyroid cancer (MTC, MEN 2, thyroid cell tumors) or pancreatitis. Continue to focus heavily on diabetic diet and exercise.  Check sugars at home at least 3 days a week. - Eye and foot exams up to date - Statin on board, consider ACE or ARB in future - Vaccinations up to date

## 2023-12-03 NOTE — Assessment & Plan Note (Signed)
Chronic, ongoing.  Continue supplement at home and recheck labs today. 

## 2023-12-03 NOTE — Assessment & Plan Note (Signed)
 Chronic, ongoing.  Continue Lyrica and adjust as needed, especially with aging monitor dose closely.  Refills up to date.  She does not wish to go up on dosing at this time, although hands are having more symptoms.  Consider neurology or PT in the future.

## 2023-12-03 NOTE — Progress Notes (Signed)
 BP 132/76   Pulse 90   Temp 97.8 F (36.6 C) (Oral)   Ht 5' 3.4" (1.61 m)   Wt 210 lb (95.3 kg)   SpO2 96%   BMI 36.73 kg/m    Subjective:    Patient ID: [name], female    DOB: 08-27-1950, 73 y.o.   MRN: [id number]  HPI: [name] is a 73 y.o. female presenting on 12/03/2023 for comprehensive medical examination. Current medical complaints include: none  She currently lives with: husband Menopausal Symptoms: no  Continues to have issues with left hand, was seen for trigger finger last year.  Starts in middle aspect where knot is, but pain is worsening.  DIABETES January A1c 6.3%.  Takes Jardiance 25 MG daily + Ozempic 0.5 MG.  She previously took Metformin but switched to SGLT2 for heart health.  Has lost 8 lbs since January with Ozempic, no ADR with this. Would like to get under 200 lbs. Hypoglycemic episodes:no Polydipsia/polyuria: no Visual disturbance: no Chest pain: no Paresthesias: yes bilateral hands which is chronic Glucose Monitoring:   Accucheck frequency: Daily  Fasting glucose: 90 to 100 range  Post prandial:  Evening:  Before meals: Taking Insulin?: no  Long acting insulin:  Short acting insulin: Blood Pressure Monitoring: weekly Retinal Examination: Up To Date, [facility] Foot Exam: Up To Date Pneumovax: Up to Date Influenza: Up to Date Aspirin: no   NEUROPATHY & HISTORY OF BREAST CA Takes Lyrica 50 MG TID for neuropathy after breast cancer treatment. Received magnesium infusions in past, but levels have been stable with Jardiance on board. Neuropathy status: controlled  Satisfied with current treatment?: yes Medication side effects: no Medication compliance:  excellent compliance Location: hands Pain: yes Severity: moderate  Quality:  sharp, burning, tender, tearing, throbbing, tingling, creeping, and pins and needles Frequency: intermittent Bilateral: yes Symmetric: yes Numbness: yes Decreased sensation: yes Weakness: no Context:  stable Alleviating factors: Lyrica Aggravating factors: unknown  HYPERTENSION / HYPERLIPIDEMIA Pacemaker, PPM [employer] is present, battery change last 07/31/23. Saw cardiology on 10/31/23. Current medications: Atenolol, Amiloride, Atorvastatin. Satisfied with current treatment? yes Duration of hypertension: chronic BP monitoring frequency: weekly BP range: <130/80 BP medication side effects: no Past BP meds:  Duration of hyperlipidemia: chronic Cholesterol medication side effects: no Cholesterol supplements: none Past cholesterol medications:  Medication compliance: excellent compliance Aspirin: no Recent stressors: yes Recurrent headaches: occasional at baseline, sinus with allergies Visual changes: no Palpitations: no Dyspnea: no Chest pain: no Lower extremity edema: yes, occasional at baseline to left side Dizzy/lightheaded: no   ELEVATED TSH Recent labs have been stable.  There is a family history of thyroid issues. Does take Nexium for GERD -- takes daily.  Continues Vitamin D and B12 for history of low levels. Fatigue: yes due to holidays Cold intolerance: no Heat intolerance: no Weight gain: no Weight loss: no Constipation: no Diarrhea/loose stools: no Palpitations: no Lower extremity edema: no Anxiety/depressed mood: no     12/03/2023    8:07 AM 08/28/2023    9:03 AM 08/25/2023   11:41 AM 05/28/2023    8:38 AM 02/25/2023    8:34 AM  Depression screen PHQ 2/9  Decreased Interest 0 0 0 0 0  Down, Depressed, Hopeless 0 0 0 0 0  PHQ - 2 Score 0 0 0 0 0  Altered sleeping 0 0  0 0  Tired, decreased energy 0 0  0 0  Change in appetite 0 0  0 0  Feeling bad or failure about yourself  0 0  0 0  Trouble concentrating 0 0  0 0  Moving slowly or fidgety/restless 0 0  0 0  Suicidal thoughts 0 0  0 0  PHQ-9 Score 0 0  0 0  Difficult doing work/chores Not difficult at all Not difficult at all   Not difficult at all       12/03/2023    8:07 AM 08/28/2023    9:04 AM  05/28/2023    8:39 AM 02/25/2023    8:34 AM  GAD 7 : Generalized Anxiety Score  Nervous, Anxious, on Edge 0 0 0 0  Control/stop worrying 0 0 0 0  Worry too much - different things 0 0 0 0  Trouble relaxing 0 0 0 0  Restless 0 0 0 0  Easily annoyed or irritable 0 0 0 0  Afraid - awful might happen 0 0 0 0  Total GAD 7 Score 0 0 0 0  Anxiety Difficulty Not difficult at all Not difficult at all  Not difficult at all      08/08/2023    9:53 AM 08/24/2023   10:17 AM 08/25/2023   11:44 AM 08/28/2023    9:03 AM 12/03/2023    8:06 AM  Fall Risk  Falls in the past year? 0 0 0 0 0  Was there an injury with Fall?  0 0 0 0  Fall Risk Category Calculator  0  0 0 0  Patient at Risk for Falls Due to   No Fall Risks No Fall Risks No Fall Risks  Fall risk Follow up   Falls prevention discussed Falls evaluation completed Falls evaluation completed     Patient-reported    Functional Status Survey: Is the patient deaf or have difficulty hearing?: No Does the patient have difficulty seeing, even when wearing glasses/contacts?: No Does the patient have difficulty concentrating, remembering, or making decisions?: No Does the patient have difficulty walking or climbing stairs?: No Does the patient have difficulty dressing or bathing?: No Does the patient have difficulty doing errands alone such as visiting a doctor's office or shopping?: No   Past Medical History:  Past Medical History:  Diagnosis Date   Allergy    Arthritis    Breast cancer, left (HCC) 09/2007   s/p lumpectomy and chemoradiation   Cardiomyopathy secondary    Cataract removed 2020   Chronic kidney disease    GERD (gastroesophageal reflux disease)    Glaucoma    History of first degree AV block    high grade; PPM placed   History of kidney stones    Hypersomnolence 09/21/2013   Hypertension    Hypothyroidism    no meds currently   Leg edema, left 09/21/2013   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   Migraines     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   Ovarian cyst 11/06/2020   Pacemaker [employer] 2004   Personal history of chemotherapy 2009   left breast ca   Personal history of radiation therapy 2009   left breast   Sinus tachycardia    Type 2 diabetes mellitus (HCC) 06/19/2015    Surgical History:  Past Surgical History:  Procedure Laterality Date   ANKLE SURGERY Left    x 3   BREAST EXCISIONAL BIOPSY Left 2009    Stage IIa, ER/PR negative, 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,   BREAST LUMPECTOMY Left 2009   chemo  and rad tx   CARPAL TUNNEL RELEASE     CESAREAN SECTION  1983   CHOLECYSTECTOMY     COLONOSCOPY WITH PROPOFOL N/A 01/13/2023   Procedure: COLONOSCOPY WITH PROPOFOL;  Surgeon: Wyline Mood, MD;  Location: [facility] ENDOSCOPY;  Service: Gastroenterology;  Laterality: N/A;   EYE SURGERY Left 12/2018   cataracts-Dr.Shah in [place]    EYE SURGERY Right 01/2019   cataracts- Dr.Shah in [facility]   GALLBLADDER SURGERY  1993   INSERT / REPLACE / REMOVE PACEMAKER  2011   revision @ [facility]   PACEMAKER INSERTION  05/13/2003   [facility] Carollee Massed - DR   PPM GENERATOR CHANGEOUT N/A 07/31/2023   Procedure: PPM GENERATOR CHANGEOUT;  Surgeon: Duke Salvia, MD;  Location: [facility] INVASIVE CV LAB;  Service: Cardiovascular;  Laterality: N/A;   ROBOTIC ASSISTED SALPINGO OOPHERECTOMY Right 01/18/2021   Procedure: XI ROBOTIC ASSISTED RIGHT SALPINGO-OOPHORECTOMY;  Surgeon: Natale Milch, MD;  Location: [facility] ORS;  Service: Gynecology;  Laterality: Right;    Medications:  Current Outpatient Medications on File Prior to Visit  Medication Sig   acetaminophen (TYLENOL) 500 MG tablet Take 1,000 mg by mouth every 6 (six) hours as needed for moderate pain (pain score 4-6).   Cholecalciferol (VITAMIN D3) 50 MCG (2000 UT) TABS Take 1 tablet by mouth daily.   CINNAMON PO Take 2 capsules by mouth in the morning.   CRANBERRY PO Take 15,000 mg by mouth daily.    cyanocobalamin (VITAMIN B12) 1000 MCG tablet Take 1,000 mcg by mouth every other day.   esomeprazole (NEXIUM) 20 MG capsule Take 20 mg by mouth every morning.   fluticasone (FLONASE) 50 MCG/ACT nasal spray Place 2 sprays into both nostrils daily.   glucose blood (ACCU-CHEK AVIVA PLUS) test strip TEST BLOOD SUGAR ONCE DAILY   ibuprofen (ADVIL) 200 MG tablet Take 400 mg by mouth every 6 (six) hours as needed for moderate pain (pain score 4-6).   levocetirizine (XYZAL) 5 MG tablet Take 5 mg by mouth every evening.   Magnesium Cl-Calcium Carbonate (SLOW-MAG PO) Take 2 tablets by mouth daily.   Multiple Vitamins-Minerals (PRESERVISION AREDS 2) CAPS Take by mouth 2 (two) times daily.   nystatin (MYCOSTATIN/NYSTOP) powder Apply 1 Application topically 2 (two) times daily. (Patient taking differently: Apply 1 Application topically 2 (two) times daily as needed (rash).)   pyridOXINE (VITAMIN B6) 100 MG tablet Take 100 mg by mouth 2 (two) times daily.   SUMAtriptan (IMITREX) 100 MG tablet Take 1 tablet (100 mg total) by mouth as needed.   tiZANidine (ZANAFLEX) 2 MG tablet Take 1 tablet (2 mg total) by mouth every 8 (eight) hours as needed for muscle spasms.   No current facility-administered medications on file prior to visit.    Allergies:  Allergies  Allergen Reactions   Penicillins Swelling    Organs flipped over per patient; skin starting splitting open and blood vessels burst in legs;  Causes organs to swell   Levofloxacin Swelling    Tendon pain and weakness in muscles Leg swelling and muscle weakness   Other Itching    bandaids   Latex Rash    Social History:  Social History   Socioeconomic History   Marital status: Divorced    Spouse name: Not on file   Number of children: 2   Years of education: Not on file   Highest education level: Bachelor's degree (e.g., BA, AB, BS)  Occupational History   Occupation: Full time    Employer: [employer]   Occupation: retired   Tobacco Use   Smoking status: Former    Current packs/day: 0.00    Average packs/day: 1.1 packs/day for 20.0 years (22.5 ttl pk-yrs)    Types: Cigarettes    Start date: 08/26/1966    Quit date: 08/26/1981    Years since quitting: 42.2   Smokeless tobacco: Never  Vaping Use   Vaping status: Never Used  Substance and Sexual Activity   Alcohol use: Not Currently   Drug use: No   Sexual activity: Not Currently    Birth control/protection: None  Other Topics Concern   Not on file  Social History Narrative   Not on file   Social Drivers of Health   Financial Resource Strain: Low Risk  (08/25/2023)   Overall Financial Resource Strain (CARDIA)    Difficulty of Paying Living Expenses: Not hard at all  Food Insecurity: No Food Insecurity (08/25/2023)   Hunger Vital Sign    Worried About Running Out of Food in the Last Year: Never true    Ran Out of Food in the Last Year: Never true  Transportation Needs: No Transportation Needs (08/25/2023)   PRAPARE - Administrator, Civil Service (Medical): No    Lack of Transportation (Non-Medical): No  Physical Activity: Insufficiently Active (08/25/2023)   Exercise Vital Sign    Days of Exercise per Week: 3 days    Minutes of Exercise per Session: 20 min  Stress: No Stress Concern Present (08/25/2023)   [employer] of Occupational Health - Occupational Stress Questionnaire    Feeling of Stress : Not at all  Social Connections: Moderately Integrated (08/25/2023)   Social Connection and Isolation Panel [NHANES]    Frequency of Communication with Friends and Family: More than three times a week    Frequency of Social Gatherings with Friends and Family: Once a week    Attends Religious Services: More than 4 times per year    Active Member of [employer] or Organizations: Yes    Attends Engineer, structural: More than 4 times per year    Marital Status: Divorced  Catering manager Violence: Not At Risk (08/25/2023)    Humiliation, Afraid, Rape, and Kick questionnaire    Fear of Current or Ex-Partner: No    Emotionally Abused: No    Physically Abused: No    Sexually Abused: No   Social History   Tobacco Use  Smoking Status Former   Current packs/day: 0.00   Average packs/day: 1.1 packs/day for 20.0 years (22.5 ttl pk-yrs)   Types: Cigarettes   Start date: 08/26/1966   Quit date: 08/26/1981   Years since quitting: 42.2  Smokeless Tobacco Never   Social History   Substance and Sexual Activity  Alcohol Use Not Currently    Family History:  Family History  Problem Relation Age of Onset   Diabetes Mother    Heart disease Mother    Thyroid disease Mother    Miscarriages / [place] Mother    Stroke Mother    Hypertension Father    Cancer Father    Alcohol abuse Father    Diabetes Father    Stroke Father    Vision loss Father    Stroke Other        Family hx of CVA or stroke   Diabetes Brother    Diabetes Daughter    Diabetes Paternal Uncle    Diabetes Paternal Grandmother    Breast cancer Maternal Aunt  Past medical history, surgical history, medications, allergies, family history and social history reviewed with patient today and changes made to appropriate areas of the chart.   ROS  All other ROS negative except what is listed above and in the HPI.      Objective:    BP 132/76   Pulse 90   Temp 97.8 F (36.6 C) (Oral)   Ht 5' 3.4" (1.61 m)   Wt 210 lb (95.3 kg)   SpO2 96%   BMI 36.73 kg/m   Wt Readings from Last 3 Encounters:  12/03/23 210 lb (95.3 kg)  10/31/23 215 lb 2 oz (97.6 kg)  08/28/23 218 lb 12.8 oz (99.2 kg)    Physical Exam Vitals and nursing note reviewed. Exam conducted with a chaperone present.  Constitutional:      General: She is awake. She is not in acute distress.    Appearance: She is well-developed and well-groomed. She is obese. She is not ill-appearing or toxic-appearing.  HENT:     Head: Normocephalic and atraumatic.     Right Ear:  Hearing, tympanic membrane, ear canal and external ear normal. No drainage.     Left Ear: Hearing, tympanic membrane, ear canal and external ear normal. No drainage.     Nose: Nose normal.     Right Sinus: No maxillary sinus tenderness or frontal sinus tenderness.     Left Sinus: No maxillary sinus tenderness or frontal sinus tenderness.     Mouth/Throat:     Mouth: Mucous membranes are moist.     Pharynx: Oropharynx is clear. Uvula midline. No pharyngeal swelling, oropharyngeal exudate or posterior oropharyngeal erythema.  Eyes:     General: Lids are normal.        Right eye: No discharge.        Left eye: No discharge.     Extraocular Movements: Extraocular movements intact.     Conjunctiva/sclera: Conjunctivae normal.     Pupils: Pupils are equal, round, and reactive to light.     Visual Fields: Right eye visual fields normal and left eye visual fields normal.  Neck:     Thyroid: No thyromegaly.     Vascular: No carotid bruit.     Trachea: Trachea normal.  Cardiovascular:     Rate and Rhythm: Normal rate and regular rhythm.     Heart sounds: Normal heart sounds. No murmur heard.    No gallop.  Pulmonary:     Effort: Pulmonary effort is normal. No accessory muscle usage or respiratory distress.     Breath sounds: Normal breath sounds.  Chest:  Breasts:    Right: Normal.     Left: Normal.  Abdominal:     General: Bowel sounds are normal.     Palpations: Abdomen is soft. There is no hepatomegaly or splenomegaly.     Tenderness: There is no abdominal tenderness.  Musculoskeletal:        General: Normal range of motion.     Right hand: Normal.     Left hand: Tenderness present. No swelling or bony tenderness. Normal range of motion. Normal strength. Normal sensation. Normal capillary refill. Normal pulse.     Cervical back: Normal range of motion and neck supple.     Right lower leg: No edema.     Left lower leg: No edema.     Comments: Cyst like area to upper palmar aspect  under middle finger left hand.  Lymphadenopathy:     Head:     Right  side of head: No submental, submandibular, tonsillar, preauricular or posterior auricular adenopathy.     Left side of head: No submental, submandibular, tonsillar, preauricular or posterior auricular adenopathy.     Cervical: No cervical adenopathy.     Upper Body:     Right upper body: No supraclavicular, axillary or pectoral adenopathy.     Left upper body: No supraclavicular, axillary or pectoral adenopathy.  Skin:    General: Skin is warm and dry.     Capillary Refill: Capillary refill takes less than 2 seconds.     Findings: No rash.  Neurological:     Mental Status: She is alert and oriented to person, place, and time.     Gait: Gait is intact.     Deep Tendon Reflexes: Reflexes are normal and symmetric.     Reflex Scores:      Brachioradialis reflexes are 2+ on the right side and 2+ on the left side.      Patellar reflexes are 2+ on the right side and 2+ on the left side. Psychiatric:        Attention and Perception: Attention normal.        Mood and Affect: Mood normal.        Speech: Speech normal.        Behavior: Behavior normal. Behavior is cooperative.        Thought Content: Thought content normal.        Judgment: Judgment normal.    Results for orders placed or performed in visit on 11/07/23  CUP PACEART REMOTE DEVICE CHECK   Collection Time: 11/07/23 12:10 AM  Result Value Ref Range   Date Time Interrogation Session [id number]    Pulse Generator Manufacturer BOST    Pulse Gen Model L311 ACCOLADE MRI    Pulse Gen Serial Number [id number]    [facility]inic [facility]    Implantable Pulse Generator Type Implantable Pulse Generator    Implantable Pulse Generator Implant Date [id number]    Implantable Lead Manufacturer BOST    Implantable Lead Model 4479 Rodney Langton    Implantable Lead Serial Number [id number]    Implantable Lead Implant Date [id number]    Implantable Lead Location [id number]     Implantable Lead Connection Status [id number]    Implantable Lead Manufacturer MERM    Implantable Lead Model 5076 CapSureFix Novus    Implantable Lead Serial Number [id number]    Implantable Lead Implant Date [id number]    Implantable Lead Location [id number]    Implantable Lead Connection Status [id number]    Lead Channel Setting Sensing Sensitivity 2.5 mV   Lead Channel Setting Sensing Adaptation Mode Fixed Pacing    Lead Channel Setting Pacing Amplitude 2.0 V   Lead Channel Setting Pacing Pulse Width 0.4 ms   Lead Channel Setting Pacing Amplitude 2.5 V   Zone Setting Status [id number]    Lead Channel Impedance Value 480 ohm   Lead Channel Pacing Threshold Amplitude 0.6 V   Lead Channel Pacing Threshold Pulse Width 0.4 ms   Lead Channel Impedance Value 502 ohm   Lead Channel Pacing Threshold Amplitude 1.2 V   Lead Channel Pacing Threshold Pulse Width 0.4 ms   Battery Status BOS    Battery Remaining Longevity 102 mo   Battery Remaining Percentage 100 %   Brady Statistic RA Percent Paced 0 %   Brady Statistic RV Percent Paced 17 %      Assessment & Plan:   Problem List Items Addressed This  Visit       Cardiovascular and Mediastinum   Hypertension associated with diabetes (HCC)   Chronic, stable.  BP at goal at office and home.  Continue current medication regimen and adjust as needed + continue collaboration with cardiology.  Recent note reviewed.  LABS: CBC, CMP. TSH.  Urine ALB 24 December 2023.  Recommend she continue to monitor BP at home and focus on DASH diet.        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 Urine ALB 24 December 2023. Will continue Jardiance 25 MG daily which has helped her magnesium levels and diabetes + Ozempic 0.5 MG weekly which is helping with weight loss.  Educated her at length on this medication. No family history of thyroid cancer (MTC, MEN 2, thyroid cell tumors) or pancreatitis. Continue to focus heavily on diabetic diet and exercise.  Check sugars at home at least 3 days a week. - Eye and foot exams up to date - Statin on board, consider ACE or ARB in future - Vaccinations up to date      Relevant Medications   atorvastatin (LIPITOR) 40 MG tablet   empagliflozin (JARDIANCE) 25 MG TABS tablet   Semaglutide,0.25 or 0.5MG /DOS, (OZEMPIC, 0.25 OR 0.5 MG/DOSE,) 2 MG/1.5ML SOPN   Other Relevant Orders   Bayer DCA Hb A1c Waived   Microalbumin, Urine Waived   Diabetes mellitus treated with oral medication (HCC)   Refer to Diabetes plan of care for further, taking Jardiance.      Relevant Medications   atorvastatin (LIPITOR) 40 MG tablet   empagliflozin (JARDIANCE) 25 MG TABS tablet   Semaglutide,0.25 or 0.5MG /DOS, (OZEMPIC, 0.25 OR 0.5 MG/DOSE,) 2 MG/1.5ML SOPN   Other Relevant Orders   Bayer DCA Hb A1c Waived   Hyperlipidemia associated with type 2 diabetes mellitus (HCC)   Chronic, ongoing.  Continue current medication regimen and adjust as needed.  Lipid panel today.      Relevant Medications   atenolol (TENORMIN) 50 MG tablet   atorvastatin (LIPITOR) 40 MG tablet   empagliflozin (JARDIANCE) 25 MG TABS tablet   Semaglutide,0.25 or 0.5MG /DOS, (OZEMPIC, 0.25 OR 0.5 MG/DOSE,) 2 MG/1.5ML SOPN   Other Relevant Orders   Bayer DCA Hb A1c Waived   Comprehensive metabolic panel with GFR   Lipid Panel w/o Chol/HDL Ratio     Nervous and Auditory   Chemotherapy-induced neuropathy (HCC)   Chronic, ongoing.  Continue Lyrica and adjust as needed, especially with aging monitor dose closely.  Refills  up to date.  She does not wish to go up on dosing at this time, although hands are having more symptoms.  Consider neurology or PT in the future.      Relevant Medications   pregabalin (LYRICA) 50 MG capsule     Genitourinary   Chronic kidney disease, stage 3a (HCC)   Chronic, ongoing.  Stable levels at this time with no decline.  Ensure plenty of hydration daily and continue Jardiance.        Other  B12 deficiency   Chronic, ongoing.  Continue supplement at home and recheck labs today.      Relevant Orders   Vitamin B12   Elevated TSH   Noted on recent labs with downward trend to normal range, no symptoms.  Recheck today.  Initiate medication as needed.      Relevant Orders   T4, free   TSH   History of breast cancer   Continue collaboration with oncology as needed.  Annual mammograms.      Hypomagnesemia   Ongoing and stable.  Recent level normal. Continue amiloride and follow up with oncology as needed. Magnesium rechecked today. Has had stable levels since being on Jardiance, since 05/15/21.      Relevant Orders   Magnesium   Obesity   BMI 36.73 with T2DM and HTN, has lost 8 lbs with Ozempic.  Recommended eating smaller high protein, low fat meals more frequently and exercising 30 mins a day 5 times a week with a goal of 10-15 lb weight loss in the next 3 months. Patient voiced their understanding and motivation to adhere to these recommendations.       Relevant Medications   empagliflozin (JARDIANCE) 25 MG TABS tablet   Semaglutide,0.25 or 0.5MG /DOS, (OZEMPIC, 0.25 OR 0.5 MG/DOSE,) 2 MG/1.5ML SOPN   Vitamin D deficiency   Chronic.  Noted past labs, continue supplement and recheck today.      Relevant Orders   VITAMIN D 25 Hydroxy (Vit-D Deficiency, Fractures)   Other Visit Diagnoses       Left hand pain       Relevant Orders   Ambulatory referral to Orthopedic Surgery     Encounter for annual physical exam       Annual physical today with labs and health  maintenance reviewed, discussed with patient.        Follow up plan: Return in about 3 months (around 03/03/2024) for T2DM, HTN/HLD, NEUROPATHY.   LABORATORY TESTING:  - Pap smear: not applicable  IMMUNIZATIONS:   - Tdap: Tetanus vaccination status reviewed: last tetanus booster within 10 years. - Influenza: Up to date - Pneumovax: Up to date - Prevnar: Up to date - COVID: Up to date - HPV: Not applicable - Shingrix vaccine: Up to date  SCREENING: -Mammogram: Up To Date, due 01/21/24 - Colonoscopy: Up To Date -- colonoscopy 01/13/23 due to positive Cologuard -- no further screening recommended per letter from GI 01/16/23 - Bone Density: Up to date  -Hearing Test: Not applicable  -Spirometry: Not applicable   PATIENT COUNSELING:   Advised to take 1 mg of folate supplement per day if capable of pregnancy.    Diet: Encouraged to adjust caloric intake to maintain  or achieve ideal body weight, to reduce intake of dietary saturated fat and total fat, to limit sodium intake by avoiding high sodium foods and not adding table salt, and to maintain adequate dietary potassium and calcium preferably from fresh fruits, vegetables, and low-fat dairy products.    Stressed the importance of regular exercise  Injury prevention: Discussed safety belts, safety helmets, smoke detector, smoking near bedding or upholstery.   Dental health: Discussed importance of regular tooth brushing, flossing, and dental visits.    NEXT PREVENTATIVE PHYSICAL DUE IN 1 YEAR. Return in about 3 months (around 03/03/2024) for T2DM, HTN/HLD, NEUROPATHY.

## 2023-12-03 NOTE — Assessment & Plan Note (Signed)
Chronic.  Noted past labs, continue supplement and recheck today. 

## 2023-12-03 NOTE — Assessment & Plan Note (Signed)
 Chronic, ongoing.  Stable levels at this time with no decline.  Ensure plenty of hydration daily and continue Jardiance.

## 2023-12-03 NOTE — Assessment & Plan Note (Signed)
Continue collaboration with oncology as needed.  Annual mammograms. 

## 2023-12-04 ENCOUNTER — Other Ambulatory Visit: Payer: Self-pay | Admitting: Nurse Practitioner

## 2023-12-04 ENCOUNTER — Encounter: Payer: Self-pay | Admitting: Nurse Practitioner

## 2023-12-04 DIAGNOSIS — Z1231 Encounter for screening mammogram for malignant neoplasm of breast: Secondary | ICD-10-CM

## 2023-12-04 LAB — CBC WITH DIFFERENTIAL/PLATELET
Basophils Absolute: 0.1 10*3/uL (ref 0.0–0.2)
Basos: 1 %
EOS (ABSOLUTE): 0.2 10*3/uL (ref 0.0–0.4)
Eos: 3 %
Hematocrit: 41.4 % (ref 34.0–46.6)
Hemoglobin: 13.9 g/dL (ref 11.1–15.9)
Immature Grans (Abs): 0.1 10*3/uL (ref 0.0–0.1)
Immature Granulocytes: 1 %
Lymphocytes Absolute: 2 10*3/uL (ref 0.7–3.1)
Lymphs: 33 %
MCH: 29.9 pg (ref 26.6–33.0)
MCHC: 33.6 g/dL (ref 31.5–35.7)
MCV: 89 fL (ref 79–97)
Monocytes Absolute: 0.5 10*3/uL (ref 0.1–0.9)
Monocytes: 8 %
Neutrophils Absolute: 3.4 10*3/uL (ref 1.4–7.0)
Neutrophils: 54 %
Platelets: 196 10*3/uL (ref 150–450)
RBC: 4.65 x10E6/uL (ref 3.77–5.28)
RDW: 13.4 % (ref 11.7–15.4)
WBC: 6.2 10*3/uL (ref 3.4–10.8)

## 2023-12-04 LAB — COMPREHENSIVE METABOLIC PANEL WITH GFR
ALT: 35 IU/L — ABNORMAL HIGH (ref 0–32)
AST: 29 IU/L (ref 0–40)
Albumin: 4.4 g/dL (ref 3.8–4.8)
Alkaline Phosphatase: 131 IU/L — ABNORMAL HIGH (ref 44–121)
BUN/Creatinine Ratio: 22 (ref 12–28)
BUN: 24 mg/dL (ref 8–27)
Bilirubin Total: 0.2 mg/dL (ref 0.0–1.2)
CO2: 25 mmol/L (ref 20–29)
Calcium: 9.8 mg/dL (ref 8.7–10.3)
Chloride: 103 mmol/L (ref 96–106)
Creatinine, Ser: 1.09 mg/dL — ABNORMAL HIGH (ref 0.57–1.00)
Globulin, Total: 2.1 g/dL (ref 1.5–4.5)
Glucose: 93 mg/dL (ref 70–99)
Potassium: 4.2 mmol/L (ref 3.5–5.2)
Sodium: 143 mmol/L (ref 134–144)
Total Protein: 6.5 g/dL (ref 6.0–8.5)
eGFR: 54 mL/min/{1.73_m2} — ABNORMAL LOW (ref >59)

## 2023-12-04 LAB — LIPID PANEL W/O CHOL/HDL RATIO
Cholesterol, Total: 134 mg/dL (ref 100–199)
HDL: 47 mg/dL (ref >39)
LDL Chol Calc (NIH): 62 mg/dL (ref 0–99)
Triglycerides: 143 mg/dL (ref 0–149)
VLDL Cholesterol Cal: 25 mg/dL (ref 5–40)

## 2023-12-04 LAB — MAGNESIUM: Magnesium: 1.7 mg/dL (ref 1.6–2.3)

## 2023-12-04 LAB — T4, FREE: Free T4: 1.23 ng/dL (ref 0.82–1.77)

## 2023-12-04 LAB — TSH: TSH: 3.26 u[IU]/mL (ref 0.450–4.500)

## 2023-12-04 LAB — VITAMIN D 25 HYDROXY (VIT D DEFICIENCY, FRACTURES): Vit D, 25-Hydroxy: 51.7 ng/mL (ref 30.0–100.0)

## 2023-12-04 LAB — VITAMIN B12: Vitamin B-12: 864 pg/mL (ref 232–1245)

## 2023-12-04 NOTE — Progress Notes (Signed)
 Contacted via MyChart   Good morning [name], your labs have returned and overall look great.  Kidney function, creatinine and eGFR, continues to show Stage 3a kidney disease with no worsening.  Liver function, AST and ALT, shows very mild increase in ALT and Alkaline phosphatase, do you still have a gall bladder?  If so we will monitor these levels closely as sometimes this is a sign of some gallstones or inflammation.  Remainder of labs are fabulous.  Any questions? Keep being amazing!!  Thank you for allowing me to participate in your care.  I appreciate you. Kindest regards, Ahri Olson

## 2023-12-08 ENCOUNTER — Encounter: Payer: Self-pay | Admitting: Internal Medicine

## 2023-12-19 NOTE — Addendum Note (Signed)
 Addended by: Lott Rouleau A on: 12/19/2023 09:55 AM   Modules accepted: Orders

## 2023-12-19 NOTE — Progress Notes (Signed)
 Remote pacemaker transmission.

## 2023-12-24 ENCOUNTER — Other Ambulatory Visit: Payer: Self-pay | Admitting: Nurse Practitioner

## 2023-12-27 NOTE — Telephone Encounter (Signed)
 Requested medication (s) are due for refill today: yes  Requested medication (s) are on the active medication list: yes  Last refill:  11/04/23  Future visit scheduled: no  Notes to clinic:  Unable to refill per protocol, cannot delegate.      Requested Prescriptions  Pending Prescriptions Disp Refills   tiZANidine  (ZANAFLEX ) 2 MG tablet [Pharmacy Med Name: TIZANIDINE  HCL 2 MG TABLET] 30 tablet 0    Sig: Take 1 tablet (2 mg total) by mouth every 8 (eight) hours as needed for muscle spasms.     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

## 2024-01-08 LAB — HM DIABETES EYE EXAM

## 2024-01-22 ENCOUNTER — Ambulatory Visit
Admission: RE | Admit: 2024-01-22 | Discharge: 2024-01-22 | Disposition: A | Discharge status: Home / Self Care | Source: Ambulatory Visit | Attending: Nurse Practitioner | Admitting: Nurse Practitioner

## 2024-01-22 DIAGNOSIS — Z1231 Encounter for screening mammogram for malignant neoplasm of breast: Secondary | ICD-10-CM | POA: Diagnosis present

## 2024-01-26 ENCOUNTER — Ambulatory Visit: Payer: Self-pay | Admitting: Nurse Practitioner

## 2024-01-26 ENCOUNTER — Other Ambulatory Visit: Payer: Self-pay | Admitting: Nurse Practitioner

## 2024-01-26 NOTE — Progress Notes (Signed)
 Contacted via MyChart   Normal mammogram, may repeat in one year:)

## 2024-01-27 NOTE — Telephone Encounter (Signed)
 Requested medications are due for refill today.  yes  Requested medications are on the active medications list.  yes  Last refill. 12/29/2023 #30 0 rf  Future visit scheduled.   yes  Notes to clinic.  Refill not delegated.    Requested Prescriptions  Pending Prescriptions Disp Refills   tiZANidine  (ZANAFLEX ) 2 MG tablet [Pharmacy Med Name: TIZANIDINE  HCL 2 MG TABLET] 30 tablet 0    Sig: Take 1 tablet (2 mg total) by mouth every 8 (eight) hours as needed for muscle spasms.     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

## 2024-02-09 ENCOUNTER — Ambulatory Visit: Payer: Medicare PPO

## 2024-02-09 DIAGNOSIS — I440 Atrioventricular block, first degree: Secondary | ICD-10-CM

## 2024-02-09 LAB — CUP PACEART REMOTE DEVICE CHECK
Battery Remaining Longevity: 108 mo
Battery Remaining Percentage: 100 %
Brady Statistic RA Percent Paced: 0 %
Brady Statistic RV Percent Paced: 16 %
Date Time Interrogation Session: 20250616001100
Implantable Lead Connection Status: 753985
Implantable Lead Connection Status: 753985
Implantable Lead Implant Date: 20040917
Implantable Lead Implant Date: 20040917
Implantable Lead Location: 753859
Implantable Lead Location: 753860
Implantable Lead Model: 4479
Implantable Lead Model: 5076
Implantable Lead Serial Number: 312732
Implantable Pulse Generator Implant Date: 20241205
Lead Channel Impedance Value: 476 Ohm
Lead Channel Impedance Value: 508 Ohm
Lead Channel Pacing Threshold Amplitude: 0.6 V
Lead Channel Pacing Threshold Amplitude: 1.2 V
Lead Channel Pacing Threshold Pulse Width: 0.4 ms
Lead Channel Pacing Threshold Pulse Width: 0.4 ms
Lead Channel Setting Pacing Amplitude: 2 V
Lead Channel Setting Pacing Amplitude: 2.5 V
Lead Channel Setting Pacing Pulse Width: 0.4 ms
Lead Channel Setting Sensing Sensitivity: 2.5 mV
Pulse Gen Serial Number: 823153
Zone Setting Status: 755011

## 2024-02-10 ENCOUNTER — Ambulatory Visit: Payer: Self-pay | Admitting: Cardiology

## 2024-02-28 NOTE — Patient Instructions (Signed)
Be Involved in Caring For Your Health:  Taking Medications When medications are taken as directed, they can greatly improve your health. But if they are not taken as prescribed, they may not work. In some cases, not taking them correctly can be harmful. To help ensure your treatment remains effective and safe, understand your medications and how to take them. Bring your medications to each visit for review by your provider.  Your lab results, notes, and after visit summary will be available on My Chart. We strongly encourage you to use this feature. If lab results are abnormal the clinic will contact you with the appropriate steps. If the clinic does not contact you assume the results are satisfactory. You can always view your results on My Chart. If you have questions regarding your health or results, please contact the clinic during office hours. You can also ask questions on My Chart.  We at [facility] are grateful that you chose [place] to provide your care. We strive to provide evidence-based and compassionate care and are always looking for feedback. If you get a survey from the clinic please complete this so we can hear your opinions.  Diabetes Mellitus and Exercise Regular exercise is important for your health, especially if you have diabetes mellitus. Exercise is not just about losing weight. It can also help you increase muscle strength and bone density and reduce body fat and stress. This can help your level of endurance and make you more fit and flexible. Why should I exercise if I have diabetes? Exercise has many benefits for people with diabetes. It can: Help lower and control your blood sugar (glucose). Help your body respond better and become more sensitive to the hormone insulin. Reduce how much insulin your body needs. Lower your risk for heart disease by: Lowering how much "bad" cholesterol and triglycerides you have in your body. Increasing how much "good" cholesterol  you have in your body. Lowering your blood pressure. Lowering your blood glucose levels. What is my activity plan? Your health care provider or an expert trained in diabetes care (certified diabetes educator) can help you make an activity plan. This plan can help you find the type of exercise that works for you. It may also tell you how often to exercise and for how long. Be sure to: Get at least 150 minutes of medium-intensity or high-intensity exercise each week. This may involve brisk walking, biking, or water aerobics. Do stretching and strengthening exercises at least 2 times a week. This may involve yoga or weight lifting. Spread out your activity over at least 3 days of the week. Get some form of physical activity each day. Do not go more than 2 days in a row without some kind of activity. Avoid being inactive for more than 30 minutes at a time. Take frequent breaks to walk or stretch. Choose activities that you enjoy. Set goals that you know you can accomplish. Start slowly and increase the intensity of your exercise over time. How do I manage my diabetes during exercise?  Monitor your blood glucose Check your blood glucose before and after you exercise. If your blood glucose is 240 mg/dL (40.9 mmol/L) or higher before you exercise, check your urine for ketones. These are chemicals created by the liver. If you have ketones in your urine, do not exercise until your blood glucose returns to normal. If your blood glucose is 100 mg/dL (5.6 mmol/L) or lower, eat a snack that has 15-20 grams of carbohydrate in  it. Check your blood glucose 15 minutes after the snack to make sure that your level is above 100 mg/dL (5.6 mmol/L) before you start to exercise. Your risk for low blood glucose (hypoglycemia) goes up during and after exercise. Know the symptoms of this condition and how to treat it. Follow these instructions at home: Keep a carbohydrate snack on hand for use before, during, and after  exercise. This can help prevent or treat hypoglycemia. Avoid injecting insulin into parts of your body that are going to be used during exercise. This may include: Your arms, when you are going to play tennis. Your legs, when you are about to go jogging. Keep track of your exercise habits. This can help you and your health care provider watch and adjust your activity plan. Write down: What you eat before and after you exercise. Blood glucose levels before and after you exercise. The type and amount of exercise you do. Talk to your health care provider before you start a new activity. They may need to: Make sure that the activity is safe for you. Adjust your insulin, other medicines, and food that you eat. Drink water while you exercise. This can stop you from losing too much water (dehydration). It can also prevent problems caused by having a lot of heat in your body (heat stroke). Where to find more information American Diabetes Association: diabetes.org Association of Diabetes Care & Education Specialists: diabeteseducator.org This information is not intended to replace advice given to you by your health care provider. Make sure you discuss any questions you have with your health care provider. Document Revised: 01/30/2022 Document Reviewed: 01/30/2022 Elsevier Patient Education  2024 [employer].

## 2024-03-04 ENCOUNTER — Encounter: Payer: Self-pay | Admitting: Nurse Practitioner

## 2024-03-04 ENCOUNTER — Ambulatory Visit: Admitting: Nurse Practitioner

## 2024-03-04 VITALS — BP 124/70 | HR 78 | Temp 97.6°F | Ht 63.5 in | Wt 206.8 lb

## 2024-03-04 DIAGNOSIS — N1831 Chronic kidney disease, stage 3a: Secondary | ICD-10-CM

## 2024-03-04 DIAGNOSIS — E119 Type 2 diabetes mellitus without complications: Secondary | ICD-10-CM

## 2024-03-04 DIAGNOSIS — E785 Hyperlipidemia, unspecified: Secondary | ICD-10-CM

## 2024-03-04 DIAGNOSIS — E1159 Type 2 diabetes mellitus with other circulatory complications: Secondary | ICD-10-CM | POA: Diagnosis not present

## 2024-03-04 DIAGNOSIS — Z7985 Long-term (current) use of injectable non-insulin antidiabetic drugs: Secondary | ICD-10-CM

## 2024-03-04 DIAGNOSIS — G620 Drug-induced polyneuropathy: Secondary | ICD-10-CM

## 2024-03-04 DIAGNOSIS — T451X5A Adverse effect of antineoplastic and immunosuppressive drugs, initial encounter: Secondary | ICD-10-CM

## 2024-03-04 DIAGNOSIS — Z6837 Body mass index (BMI) 37.0-37.9, adult: Secondary | ICD-10-CM

## 2024-03-04 DIAGNOSIS — E1169 Type 2 diabetes mellitus with other specified complication: Secondary | ICD-10-CM | POA: Diagnosis not present

## 2024-03-04 DIAGNOSIS — Z950 Presence of cardiac pacemaker: Secondary | ICD-10-CM

## 2024-03-04 DIAGNOSIS — E66812 Obesity, class 2: Secondary | ICD-10-CM

## 2024-03-04 DIAGNOSIS — I152 Hypertension secondary to endocrine disorders: Secondary | ICD-10-CM

## 2024-03-04 LAB — BAYER DCA HB A1C WAIVED: HB A1C (BAYER DCA - WAIVED): 5.6 % (ref 4.8–5.6)

## 2024-03-04 MED ORDER — PREGABALIN 50 MG PO CAPS: 50.0000 mg | ORAL_CAPSULE | Freq: Three times a day (TID) | ORAL | 1 refills | Status: DC

## 2024-03-04 MED ORDER — SEMAGLUTIDE (1 MG/DOSE) 4 MG/3ML ~~LOC~~ SOPN: 1.0000 mg | PEN_INJECTOR | SUBCUTANEOUS | 3 refills | Status: AC

## 2024-03-04 NOTE — Assessment & Plan Note (Signed)
 Chronic, stable with A1c 5.6% today. Urine ALB 24 December 2023. Will continue Jardiance  25 MG daily which has helped her magnesium  levels and diabetes, but will increase Ozempic  to 1 MG weekly to further assist with her diabetes and weight loss journey.  Educated her at length on this medication. No family history of thyroid cancer (MTC, MEN 2, thyroid cell tumors) or pancreatitis. Continue to focus heavily on diabetic diet and exercise.  Check sugars at home at least 3 days a week. - Eye and foot exams up to date - Statin on board, consider ACE or ARB in future - Vaccinations up to date

## 2024-03-04 NOTE — Assessment & Plan Note (Signed)
 Chronic, ongoing.  Stable levels at this time with no decline.  Ensure plenty of hydration daily and continue Jardiance.

## 2024-03-04 NOTE — Progress Notes (Signed)
 BP 124/70   Pulse 78   Temp 97.6 F (36.4 C) (Oral)   Ht 5' 3.5 (1.613 m)   Wt 206 lb 12.8 oz (93.8 kg)   SpO2 93%   BMI 36.06 kg/m    Subjective:    Patient ID: [name], female    DOB: 1951-02-16, 73 y.o.   MRN: [id number]  HPI: [name] is a 73 y.o. female  Chief Complaint  Patient presents with   Diabetes   Hyperlipidemia   Hypertension   DIABETES A1c in April was 5.6%. Continues on Jardiance  25 MG daily + Ozempic  0.5 MG.  Previously took Metformin  but switched to SGLT2 for heart health.  Has lost 12 lbs since January with Ozempic , no ADR with this. Her goal is to get to 200 lbs. Hypoglycemic episodes:no Polydipsia/polyuria: no Visual disturbance: no Chest pain: no Paresthesias: yes bilateral hands which is chronic Glucose Monitoring:   Accucheck frequency: Daily  Fasting glucose: 99 this morning  Post prandial:  Evening:  Before meals: Taking Insulin?: no  Long acting insulin:  Short acting insulin: Blood Pressure Monitoring: weekly Retinal Examination: Up To Date = [facility] Foot Exam: Up To Date Pneumovax: Up to Date Influenza: Up to Date Aspirin: no   HYPERTENSION / HYPERLIPIDEMIA Last cardiology visit was on 10/31/23. Pacemaker, PPM [employer] is present, battery change last 07/31/23.  Current medications: Atenolol , Amiloride , Atorvastatin . Satisfied with current treatment? yes Duration of hypertension: chronic BP monitoring frequency: a few times a week BP range: <130/80 BP medication side effects: no Past BP meds:  Duration of hyperlipidemia: chronic Cholesterol medication side effects: no Cholesterol supplements: none Past cholesterol medications:  Medication compliance: excellent compliance Aspirin: no Recent stressors: yes Recurrent headaches: occasional at baseline, due to sinuses Visual changes: no Palpitations: no Dyspnea: no Chest pain: no Lower extremity edema: yes, occasional to left side at  baseline Dizzy/lightheaded: no   NEUROPATHY & HISTORY OF BREAST CA Continues Lyrica  50 MG TID for neuropathy after breast cancer treatment. Received magnesium  infusions in past, but levels have been stable with Jardiance  on board. Neuropathy status: controlled  Satisfied with current treatment?: yes Medication side effects: no Medication compliance:  excellent compliance Location: hands Pain: yes Severity: moderate  Quality:  sharp, burning, tender, tearing, throbbing, tingling, creeping, and pins and needles Frequency: intermittent Bilateral: yes Symmetric: yes Numbness: yes Decreased sensation: yes Weakness: no Context: stable Alleviating factors: Lyrica  Aggravating factors: unknown  Relevant past medical, surgical, family and social history reviewed and updated as indicated. Interim medical history since our last visit reviewed. Allergies and medications reviewed and updated.  Review of Systems  Constitutional:  Negative for activity change, appetite change, diaphoresis, fatigue and fever.  Respiratory:  Negative for cough, chest tightness and shortness of breath.   Cardiovascular:  Negative for chest pain, palpitations and leg swelling.  Gastrointestinal: Negative.   Neurological: Negative.   Psychiatric/Behavioral: Negative.     Per HPI unless specifically indicated above     Objective:    BP 124/70   Pulse 78   Temp 97.6 F (36.4 C) (Oral)   Ht 5' 3.5 (1.613 m)   Wt 206 lb 12.8 oz (93.8 kg)   SpO2 93%   BMI 36.06 kg/m   Wt Readings from Last 3 Encounters:  03/04/24 206 lb 12.8 oz (93.8 kg)  12/03/23 210 lb (95.3 kg)  10/31/23 215 lb 2 oz (97.6 kg)    Physical Exam Vitals and nursing note reviewed.  Constitutional:  General: She is awake. She is not in acute distress.    Appearance: She is well-developed and well-groomed. She is obese. She is not ill-appearing or toxic-appearing.  HENT:     Head: Normocephalic.     Right Ear: Hearing, ear canal  and external ear normal.     Left Ear: Hearing, ear canal and external ear normal.     Mouth/Throat:     Mouth: Mucous membranes are moist.  Eyes:     General: Lids are normal.        Right eye: No discharge.        Left eye: No discharge.     Conjunctiva/sclera: Conjunctivae normal.     Pupils: Pupils are equal, round, and reactive to light.  Neck:     Thyroid: No thyromegaly.     Vascular: No carotid bruit.  Cardiovascular:     Rate and Rhythm: Normal rate and regular rhythm.     Heart sounds: Normal heart sounds. No murmur heard.    No gallop.  Pulmonary:     Effort: Pulmonary effort is normal. No accessory muscle usage or respiratory distress.     Breath sounds: Normal breath sounds.  Abdominal:     General: Bowel sounds are normal.     Palpations: Abdomen is soft.  Musculoskeletal:     Cervical back: Normal range of motion and neck supple.     Right lower leg: No edema.     Left lower leg: No edema.  Lymphadenopathy:     Cervical: No cervical adenopathy.  Skin:    General: Skin is warm and dry.  Neurological:     Mental Status: She is alert and oriented to person, place, and time.     Cranial Nerves: Cranial nerves 2-12 are intact.  Psychiatric:        Attention and Perception: Attention normal.        Mood and Affect: Mood normal.        Speech: Speech normal.        Behavior: Behavior normal. Behavior is cooperative.        Thought Content: Thought content normal.    Results for orders placed or performed in visit on 02/09/24  CUP PACEART REMOTE DEVICE CHECK   Collection Time: 02/09/24 12:11 AM  Result Value Ref Range   Date Time Interrogation Session [id number]    Pulse Generator Manufacturer B[employer]    Pulse Gen Model L311 ACCOLADE MRI    Pulse Gen Serial Number [id number]    Clinic Name [facility]    Implantable Pulse Generator Type Implantable Pulse Generator    Implantable Pulse Generator Implant Date [id number]    Implantable Lead Manufacturer [employer]     Implantable Lead Model 4479 Sandre PONCE Emeterio YASMIN    Implantable Lead Serial Number [id number]    Implantable Lead Implant Date [id number]    Implantable Lead Location [id number]    Implantable Lead Connection Status [id number]    Implantable Lead Manufacturer [facility]    Implantable Lead Model 5076 CapSureFix Novus    Implantable Lead Serial Number [id number]    Implantable Lead Implant Date [id number]    Implantable Lead Location [id number]    Implantable Lead Connection Status [id number]    Lead Channel Setting Sensing Sensitivity 2.5 mV   Lead Channel Setting Sensing Adaptation Mode Fixed Pacing    Lead Channel Setting Pacing Amplitude 2.0 V   Lead Channel Setting Pacing Pulse Width 0.4 ms   Lead Channel Setting Pacing Amplitude 2.5  V   Zone Setting Status [phone]    Lead Channel Impedance Value 476 ohm   Lead Channel Pacing Threshold Amplitude 0.6 V   Lead Channel Pacing Threshold Pulse Width 0.4 ms   Lead Channel Impedance Value 508 ohm   Lead Channel Pacing Threshold Amplitude 1.2 V   Lead Channel Pacing Threshold Pulse Width 0.4 ms   Battery Status BOS    Battery Remaining Longevity 108 mo   Battery Remaining Percentage 100 %   Brady Statistic RA Percent Paced 0 %   Brady Statistic RV Percent Paced 16 %      Assessment & Plan:   Problem List Items Addressed This Visit       Cardiovascular and Mediastinum   Hypertension associated with diabetes (HCC)   Chronic, stable.  BP at goal at office and home.  Continue current medication regimen and adjust as needed + continue collaboration with cardiology.  Recent note reviewed.  LABS: CMP.  Urine ALB 24 December 2023.  Recommend she continue to monitor BP at home and focus on DASH diet.        Relevant Medications   Semaglutide , 1 MG/DOSE, 4 MG/3ML SOPN   Other Relevant Orders   Bayer DCA Hb A1c Waived     Endocrine   Hyperlipidemia associated with type 2 diabetes mellitus (HCC)   Chronic, ongoing.  Continue current medication regimen and adjust as  needed.  Lipid panel today.      Relevant Medications   Semaglutide , 1 MG/DOSE, 4 MG/3ML SOPN   Other Relevant Orders   Bayer DCA Hb A1c Waived   Comprehensive metabolic panel with GFR   Lipid Panel w/o Chol/HDL Ratio   Diabetes mellitus treated with injections of non-insulin medication (HCC)   Refer to diabetes plan of care for further, taking Ozempic .      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 Urine ALB 24 December 2023. Will continue Jardiance  25 MG daily which has helped her magnesium  levels and diabetes, but will increase Ozempic  to 1 MG weekly to further assist with her diabetes and weight loss journey.  Educated her at length on this medication. No family history of thyroid cancer (MTC, MEN 2, thyroid cell tumors) or pancreatitis. Continue to focus heavily on diabetic diet and exercise.  Check sugars at home at least 3 days a week. - Eye and foot exams up to date - Statin on board, consider ACE or ARB in future - Vaccinations up to date      Relevant Medications   Semaglutide , 1 MG/DOSE, 4 MG/3ML SOPN   Other Relevant Orders   Bayer DCA Hb A1c Waived     Nervous and Auditory   Chemotherapy-induced neuropathy (HCC)   Chronic, ongoing.  Continue Lyrica  and adjust as needed, especially with aging monitor dose closely.  Refills up to date.  She wishes to maintain current dosing.  Consider neurology or PT in the future.      Relevant Medications   pregabalin  (LYRICA ) 50 MG capsule     Genitourinary   Chronic kidney disease, stage 3a (HCC)   Chronic, ongoing.  Stable levels at this time with no decline.  Ensure plenty of hydration daily and continue Jardiance .      Relevant Orders   Comprehensive metabolic panel with GFR  Other   PPM-[employer]   Continue collaboration with  cardiology for checks.      Obesity   BMI 36.06 with T2DM and HTN, has lost 12 lbs with Ozempic .  Recommended eating smaller high protein, low fat meals more frequently and exercising 30 mins a day 5 times a week with a goal of 10-15 lb weight loss in the next 3 months. Patient voiced their understanding and motivation to adhere to these recommendations.       Relevant Medications   Semaglutide , 1 MG/DOSE, 4 MG/3ML SOPN     Follow up plan: Return in about 3 months (around 06/04/2024) for T2DM, HTN/HLD, NEUROPATHY.

## 2024-03-04 NOTE — Assessment & Plan Note (Signed)
 Chronic, stable.  BP at goal at office and home.  Continue current medication regimen and adjust as needed + continue collaboration with cardiology.  Recent note reviewed.  LABS: CMP.  Urine ALB 24 December 2023.  Recommend she continue to monitor BP at home and focus on DASH diet.

## 2024-03-04 NOTE — Assessment & Plan Note (Signed)
 BMI 36.06 with T2DM and HTN, has lost 12 lbs with Ozempic .  Recommended eating smaller high protein, low fat meals more frequently and exercising 30 mins a day 5 times a week with a goal of 10-15 lb weight loss in the next 3 months. Patient voiced their understanding and motivation to adhere to these recommendations.

## 2024-03-04 NOTE — Assessment & Plan Note (Addendum)
 Refer to diabetes plan of care for further, taking Ozempic .

## 2024-03-04 NOTE — Assessment & Plan Note (Signed)
 Chronic, ongoing.  Continue current medication regimen and adjust as needed. Lipid panel today.

## 2024-03-04 NOTE — Assessment & Plan Note (Signed)
Continue collaboration with cardiology for checks. 

## 2024-03-04 NOTE — Assessment & Plan Note (Signed)
 Chronic, ongoing.  Continue Lyrica  and adjust as needed, especially with aging monitor dose closely.  Refills up to date.  She wishes to maintain current dosing.  Consider neurology or PT in the future.

## 2024-03-05 ENCOUNTER — Ambulatory Visit: Payer: Self-pay | Admitting: Nurse Practitioner

## 2024-03-05 LAB — COMPREHENSIVE METABOLIC PANEL WITH GFR
ALT: 23 IU/L (ref 0–32)
AST: 25 IU/L (ref 0–40)
Albumin: 4.6 g/dL (ref 3.8–4.8)
Alkaline Phosphatase: 89 IU/L (ref 44–121)
BUN/Creatinine Ratio: 21 (ref 12–28)
BUN: 22 mg/dL (ref 8–27)
Bilirubin Total: 0.4 mg/dL (ref 0.0–1.2)
CO2: 24 mmol/L (ref 20–29)
Calcium: 9.7 mg/dL (ref 8.7–10.3)
Chloride: 102 mmol/L (ref 96–106)
Creatinine, Ser: 1.07 mg/dL — ABNORMAL HIGH (ref 0.57–1.00)
Globulin, Total: 1.9 g/dL (ref 1.5–4.5)
Glucose: 89 mg/dL (ref 70–99)
Potassium: 4.1 mmol/L (ref 3.5–5.2)
Sodium: 142 mmol/L (ref 134–144)
Total Protein: 6.5 g/dL (ref 6.0–8.5)
eGFR: 55 mL/min/1.73 — ABNORMAL LOW (ref >59)

## 2024-03-05 LAB — LIPID PANEL W/O CHOL/HDL RATIO
Cholesterol, Total: 115 mg/dL (ref 100–199)
HDL: 41 mg/dL (ref >39)
LDL Chol Calc (NIH): 54 mg/dL (ref 0–99)
Triglycerides: 108 mg/dL (ref 0–149)
VLDL Cholesterol Cal: 20 mg/dL (ref 5–40)

## 2024-03-10 ENCOUNTER — Encounter: Payer: Self-pay | Admitting: Nurse Practitioner

## 2024-03-18 NOTE — Progress Notes (Signed)
 Remote pacemaker transmission.

## 2024-04-08 DIAGNOSIS — M62830 Muscle spasm of back: Secondary | ICD-10-CM | POA: Diagnosis not present

## 2024-04-08 DIAGNOSIS — M9903 Segmental and somatic dysfunction of lumbar region: Secondary | ICD-10-CM | POA: Diagnosis not present

## 2024-04-08 DIAGNOSIS — M5033 Other cervical disc degeneration, cervicothoracic region: Secondary | ICD-10-CM | POA: Diagnosis not present

## 2024-04-08 DIAGNOSIS — M9901 Segmental and somatic dysfunction of cervical region: Secondary | ICD-10-CM | POA: Diagnosis not present

## 2024-05-06 DIAGNOSIS — M62830 Muscle spasm of back: Secondary | ICD-10-CM | POA: Diagnosis not present

## 2024-05-06 DIAGNOSIS — M9901 Segmental and somatic dysfunction of cervical region: Secondary | ICD-10-CM | POA: Diagnosis not present

## 2024-05-06 DIAGNOSIS — M9903 Segmental and somatic dysfunction of lumbar region: Secondary | ICD-10-CM | POA: Diagnosis not present

## 2024-05-06 DIAGNOSIS — M5033 Other cervical disc degeneration, cervicothoracic region: Secondary | ICD-10-CM | POA: Diagnosis not present

## 2024-05-10 ENCOUNTER — Encounter

## 2024-05-17 ENCOUNTER — Ambulatory Visit (INDEPENDENT_AMBULATORY_CARE_PROVIDER_SITE_OTHER)

## 2024-05-17 DIAGNOSIS — I440 Atrioventricular block, first degree: Secondary | ICD-10-CM

## 2024-05-18 LAB — CUP PACEART REMOTE DEVICE CHECK
Battery Remaining Longevity: 102 mo
Battery Remaining Percentage: 100 %
Brady Statistic RA Percent Paced: 0 %
Brady Statistic RV Percent Paced: 15 %
Date Time Interrogation Session: 20250922001100
Lead Channel Impedance Value: 489 Ohm
Lead Channel Impedance Value: 497 Ohm
Lead Channel Pacing Threshold Amplitude: 0.6 V
Lead Channel Pacing Threshold Amplitude: 1 V
Lead Channel Pacing Threshold Pulse Width: 0.4 ms
Lead Channel Pacing Threshold Pulse Width: 0.4 ms
Lead Channel Setting Pacing Amplitude: 2 V
Lead Channel Setting Pacing Amplitude: 2.5 V
Lead Channel Setting Pacing Pulse Width: 0.4 ms
Lead Channel Setting Sensing Sensitivity: 2.5 mV
Pulse Gen Serial Number: 823153
Zone Setting Status: 755011

## 2024-05-19 ENCOUNTER — Ambulatory Visit: Payer: Self-pay | Admitting: Cardiology

## 2024-05-19 NOTE — Progress Notes (Signed)
 Remote PPM Transmission

## 2024-05-21 ENCOUNTER — Other Ambulatory Visit: Payer: Self-pay | Admitting: Nurse Practitioner

## 2024-05-24 ENCOUNTER — Other Ambulatory Visit: Payer: Self-pay | Admitting: Medical Genetics

## 2024-05-24 NOTE — Telephone Encounter (Signed)
 Too soon for refill, LRF 12/03/23 for 90 and 4 RF.E-Prescribing Status: Receipt confirmed by pharmacy (12/03/2023  8:46 AM EDT).  Requested Prescriptions  Pending Prescriptions Disp Refills   montelukast  (SINGULAIR ) 10 MG tablet [Pharmacy Med Name: MONTELUKAST  SOD 10 MG TABLET] 90 tablet 0    Sig: Take 1 tablet (10 mg total) by mouth at bedtime.     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

## 2024-05-28 ENCOUNTER — Encounter: Payer: Self-pay | Admitting: Nurse Practitioner

## 2024-05-28 ENCOUNTER — Ambulatory Visit: Admitting: Nurse Practitioner

## 2024-05-28 VITALS — BP 130/70 | HR 82 | Temp 98.1°F | Resp 15 | Ht 63.5 in | Wt 198.0 lb

## 2024-05-28 DIAGNOSIS — G620 Drug-induced polyneuropathy: Secondary | ICD-10-CM | POA: Diagnosis not present

## 2024-05-28 DIAGNOSIS — E66811 Obesity, class 1: Secondary | ICD-10-CM

## 2024-05-28 DIAGNOSIS — E6609 Other obesity due to excess calories: Secondary | ICD-10-CM

## 2024-05-28 DIAGNOSIS — E1159 Type 2 diabetes mellitus with other circulatory complications: Secondary | ICD-10-CM

## 2024-05-28 DIAGNOSIS — T451X5A Adverse effect of antineoplastic and immunosuppressive drugs, initial encounter: Secondary | ICD-10-CM

## 2024-05-28 DIAGNOSIS — E119 Type 2 diabetes mellitus without complications: Secondary | ICD-10-CM

## 2024-05-28 DIAGNOSIS — N1831 Chronic kidney disease, stage 3a: Secondary | ICD-10-CM | POA: Diagnosis not present

## 2024-05-28 DIAGNOSIS — E1169 Type 2 diabetes mellitus with other specified complication: Secondary | ICD-10-CM | POA: Diagnosis not present

## 2024-05-28 DIAGNOSIS — J0110 Acute frontal sinusitis, unspecified: Secondary | ICD-10-CM | POA: Diagnosis not present

## 2024-05-28 DIAGNOSIS — Z7985 Long-term (current) use of injectable non-insulin antidiabetic drugs: Secondary | ICD-10-CM

## 2024-05-28 DIAGNOSIS — I152 Hypertension secondary to endocrine disorders: Secondary | ICD-10-CM | POA: Diagnosis not present

## 2024-05-28 DIAGNOSIS — E785 Hyperlipidemia, unspecified: Secondary | ICD-10-CM | POA: Diagnosis not present

## 2024-05-28 DIAGNOSIS — M65332 Trigger finger, left middle finger: Secondary | ICD-10-CM | POA: Diagnosis not present

## 2024-05-28 LAB — BAYER DCA HB A1C WAIVED: HB A1C (BAYER DCA - WAIVED): 5.2 % (ref 4.8–5.6)

## 2024-05-28 MED ORDER — AZITHROMYCIN 250 MG PO TABS
ORAL_TABLET | ORAL | 0 refills | Status: AC
Start: 2024-05-28 — End: 2024-06-02

## 2024-05-28 MED ORDER — PREGABALIN 75 MG PO CAPS: 75.0000 mg | ORAL_CAPSULE | Freq: Three times a day (TID) | ORAL | 3 refills | Status: DC

## 2024-05-28 NOTE — Patient Instructions (Signed)

## 2024-05-28 NOTE — Assessment & Plan Note (Signed)
 Chronic, ongoing.  Continue Lyrica  but will trial increase to 75 MG TID, she reports the medication has never made her tired and will alert provider if any side effects with increase.  Renal dosed based on CrCl 63.  Consider neurology or PT in the future.

## 2024-05-28 NOTE — Assessment & Plan Note (Signed)
 Chronic, stable.  BP at goal at office and home.  Continue current medication regimen and adjust as needed + continue collaboration with cardiology.  Recent note reviewed.  LABS: CMP.  Urine ALB 24 December 2023.  Recommend she continue to monitor BP at home and focus on DASH diet.

## 2024-05-28 NOTE — Assessment & Plan Note (Signed)
 Refer to diabetes plan of care for further, taking Ozempic .

## 2024-05-28 NOTE — Assessment & Plan Note (Signed)
 Chronic, ongoing.  Stable levels at this time with no decline.  Ensure plenty of hydration daily and continue Jardiance.

## 2024-05-28 NOTE — Assessment & Plan Note (Signed)
 Chronic, ongoing.  Continue current medication regimen and adjust as needed. Lipid panel today.

## 2024-05-28 NOTE — Assessment & Plan Note (Signed)
 Chronic, stable with A1c 5.2% today. Urine ALB 24 December 2023. Will continue Jardiance  25 MG daily which has helped her magnesium  levels and diabetes + continue Ozempic  1 MG weekly to further assist with her diabetes and weight loss journey.  Educated her at length on this medication. No family history of thyroid cancer (MTC, MEN 2, thyroid cell tumors) or pancreatitis. Continue to focus heavily on diabetic diet and exercise.  Check sugars at home at least 3 days a week. - Eye and foot exams up to date - Statin on board, consider ACE or ARB in future - Vaccinations up to date

## 2024-05-28 NOTE — Assessment & Plan Note (Signed)
 BMI 34.52 with T2DM and HTN, has lost 18 lbs with Ozempic .  Recommended eating smaller high protein, low fat meals more frequently and exercising 30 mins a day 5 times a week with a goal of 10-15 lb weight loss in the next 3 months. Patient voiced their understanding and motivation to adhere to these recommendations.

## 2024-05-28 NOTE — Assessment & Plan Note (Signed)
Ongoing and stable.  Recent level normal. Continue amiloride and follow up with oncology as needed. Magnesium rechecked today. Has had stable levels since being on Jardiance, since 05/15/21.

## 2024-05-28 NOTE — Progress Notes (Signed)
 BP 130/70 (BP Location: Left Arm, Patient Position: Sitting, Cuff Size: Large)   Pulse 82   Temp 98.1 F (36.7 C) (Oral)   Resp 15   Ht 5' 3.5 (1.613 m)   Wt 198 lb (89.8 kg)   SpO2 99%   BMI 34.52 kg/m    Subjective:    Patient ID: [name], female    DOB: 1951-04-26, 73 y.o.   MRN: [id number]  HPI: [name] is a 73 y.o. female  Chief Complaint  Patient presents with   Diabetes    Doing well, 95 this morning. Recent weight loss she is very happy about. Off Ozempic  for 2 weeks pending eye surgery and wants to be sure nothing else needs to be paused while waiting.    Hypertension    Takes her medications as she is prescribed and checks weekly.    Trigger finger    Left hand middle finger. Shot worked in the past but since returned.    Diabetic Neuropathy    Wonders if here Lyrica  is maybe not working as well as it had been.    Thinks she may have had sinus infection, is improving -- started a couple months ago but is lingering.  DIABETES A1c in July was 5.6%. Taking Jardiance  25 MG daily + Ozempic  1 MG (is off of this for 2 weeks due to upcoming eye surgery).  Previously took Metformin  but switched to SGLT2 for heart health. Lost 18 lbs since January with Ozempic , no ADR with this. 6 lbs since last visit. 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 Cardiology visit was on 10/31/23. Current medications: Atenolol ,  Amiloride , Atorvastatin . Satisfied with current treatment? yes Duration of hypertension: chronic BP monitoring frequency: once a week BP range: <130/80 BP medication side effects: no Past BP meds:  Duration of hyperlipidemia: chronic Cholesterol medication side effects: no Cholesterol supplements: none Past cholesterol medications:  Medication compliance: excellent compliance Aspirin: no Recent stressors: yes Recurrent headaches: no Visual changes: no Palpitations: no Dyspnea: no Chest pain: no Lower extremity edema: yes, occasional to left side at baseline Dizzy/lightheaded: no   NEUROPATHY & HISTORY OF BREAST CA Takes Lyrica  50 MG TID for neuropathy after breast cancer treatment. Over past  month or so pain has increased quite a bit, close to what it was before she took the Lyrica . From head to toe. Yesterday was an 11/10.  Today not to bad this morning. Trigger finger to left middle finger has returned, saw [facility] 3 months ago for this. Left shoulder has spur under shoulder blade, having more pain with lifting arm upwards -- this is improving.  Received magnesium  infusions in past, but levels have been stable with Jardiance  on board. Neuropathy status: controlled  Satisfied with current treatment?: yes Medication side effects: no Medication compliance:  excellent compliance Location: all over Pain: yes Severity: moderate  Quality:  sharp, burning, tender, tearing, throbbing, tingling, creeping, and pins and needles Frequency: intermittent Bilateral: yes Symmetric: yes Numbness: yes Decreased  sensation: yes Weakness: no Context: stable Alleviating factors: Lyrica  Aggravating factors: unknown  Relevant past medical, surgical, family and social history reviewed and updated as indicated. Interim medical history since our last visit reviewed. Allergies and medications reviewed and updated.  Review of Systems  Constitutional:  Negative for activity change, appetite  change, diaphoresis, fatigue and fever.  Respiratory:  Negative for cough, chest tightness and shortness of breath.   Cardiovascular:  Negative for chest pain, palpitations and leg swelling.  Gastrointestinal: Negative.   Neurological: Negative.   Psychiatric/Behavioral: Negative.     Per HPI unless specifically indicated above     Objective:    BP 130/70 (BP Location: Left Arm, Patient Position: Sitting, Cuff Size: Large)   Pulse 82   Temp 98.1 F (36.7 C) (Oral)   Resp 15   Ht 5' 3.5 (1.613 m)   Wt 198 lb (89.8 kg)   SpO2 99%   BMI 34.52 kg/m   Wt Readings from Last 3 Encounters:  05/28/24 198 lb (89.8 kg)  03/04/24 206 lb 12.8 oz (93.8 kg)  12/03/23 210 lb (95.3 kg)    Physical Exam Vitals and nursing note reviewed.  Constitutional:      General: She is awake. She is not in acute distress.    Appearance: She is well-developed and well-groomed. She is obese. She is not ill-appearing or toxic-appearing.  HENT:     Head: Normocephalic.     Right Ear: Hearing, ear canal and external ear normal.     Left Ear: Hearing, ear canal and external ear normal.     Mouth/Throat:     Mouth: Mucous membranes are moist.  Eyes:     General: Lids are normal.        Right eye: No discharge.        Left eye: No discharge.     Conjunctiva/sclera: Conjunctivae normal.     Pupils: Pupils are equal, round, and reactive to light.  Neck:     Thyroid: No thyromegaly.     Vascular: No carotid bruit.  Cardiovascular:     Rate and Rhythm: Normal rate and regular rhythm.     Heart sounds: Normal heart sounds. No murmur heard.    No gallop.  Pulmonary:     Effort: Pulmonary effort is normal. No accessory muscle usage or respiratory distress.     Breath sounds: Normal breath sounds.  Abdominal:     General: Bowel sounds are normal.     Palpations: Abdomen is soft.  Musculoskeletal:     Cervical back: Normal range of motion and neck supple.     Right lower leg: No edema.     Left lower  leg: No edema.  Lymphadenopathy:     Cervical: No cervical adenopathy.  Skin:    General: Skin is warm and dry.  Neurological:     Mental Status: She is alert and oriented to person, place, and time.     Cranial Nerves: Cranial nerves 2-12 are intact.  Psychiatric:        Attention and Perception: Attention normal.        Mood and Affect: Mood normal.        Speech: Speech normal.        Behavior: Behavior normal. Behavior is cooperative.        Thought Content: Thought content normal.    Results for orders placed or performed in visit on 05/28/24  Bayer DCA Hb A1c Waived   Collection Time: 05/28/24  9:15  AM  Result Value Ref Range   HB A1C (BAYER DCA - WAIVED) 5.2 4.8 - 5.6 %      Assessment & Plan:   Problem List Items Addressed This Visit       Cardiovascular and Mediastinum   Hypertension associated with diabetes (HCC)   Chronic, stable.  BP at goal at office and home.  Continue current medication regimen and adjust as needed + continue collaboration with cardiology.  Recent note reviewed.  LABS: CMP.  Urine ALB 24 December 2023.  Recommend she continue to monitor BP at home and focus on DASH diet.        Relevant Orders   Bayer DCA Hb A1c Waived (Completed)     Endocrine   Hyperlipidemia associated with type 2 diabetes mellitus (HCC)   Chronic, ongoing.  Continue current medication regimen and adjust as needed.  Lipid panel today.      Relevant Orders   Bayer DCA Hb A1c Waived (Completed)   Comprehensive metabolic panel with GFR   Lipid Panel w/o Chol/HDL Ratio   Diabetes mellitus treated with injections of non-insulin medication (HCC)   Refer to diabetes plan of care for further, taking Ozempic .      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 Urine ALB 24 December 2023. Will continue Jardiance  25 MG daily which has helped her magnesium  levels and  diabetes + continue Ozempic  1 MG weekly to further assist with her diabetes and weight loss journey.  Educated her at length on this medication. No family history of thyroid cancer (MTC, MEN 2, thyroid cell tumors) or pancreatitis. Continue to focus heavily on diabetic diet and exercise.  Check sugars at home at least 3 days a week. - Eye and foot exams up to date - Statin on board, consider ACE or ARB in future - Vaccinations up to date      Relevant Orders   Bayer DCA Hb A1c Waived (Completed)     Nervous and Auditory   Chemotherapy-induced neuropathy   Chronic, ongoing.  Continue Lyrica  but will trial increase to 75 MG TID, she reports the medication has never made her tired and will alert provider if any side effects with increase.  Renal dosed based on CrCl 63.  Consider neurology or PT in the future.      Relevant Medications   pregabalin  (LYRICA ) 75 MG capsule     Genitourinary   Chronic kidney disease, stage 3a (HCC)   Chronic, ongoing.  Stable levels at this time with no decline.  Ensure plenty of hydration daily and continue Jardiance .        Other   Obesity   BMI 34.52 with T2DM and HTN, has lost 18 lbs with Ozempic .  Recommended eating smaller high protein, low fat meals more frequently and exercising 30 mins a day 5 times a week with a goal of 10-15 lb weight loss in the next 3 months. Patient voiced their understanding and motivation to adhere to these recommendations.       Hypomagnesemia   Ongoing and stable.  Recent level normal. Continue amiloride  and follow up with oncology as needed. Magnesium  rechecked today. Has had stable levels since being on Jardiance , since 05/15/21.      Relevant Orders   Magnesium    Other Visit Diagnoses       Trigger finger, left middle finger  Referral to ortho placed   Relevant Orders   Ambulatory referral to Orthopedic Surgery     Acute non-recurrent frontal sinusitis       Treat with Zpack which works well for her and  continue all allergy medications.   Relevant Medications   azithromycin  (ZITHROMAX ) 250 MG tablet        Follow up plan: Return in about 6 weeks (around 07/09/2024) for Neuropathy -- increased Lyrica  to 75 MG TID.

## 2024-05-29 ENCOUNTER — Ambulatory Visit: Payer: Self-pay | Admitting: Nurse Practitioner

## 2024-05-29 LAB — COMPREHENSIVE METABOLIC PANEL WITH GFR
ALT: 26 IU/L (ref 0–32)
AST: 25 IU/L (ref 0–40)
Albumin: 4.7 g/dL (ref 3.8–4.8)
Alkaline Phosphatase: 96 IU/L (ref 49–135)
BUN/Creatinine Ratio: 20 (ref 12–28)
BUN: 19 mg/dL (ref 8–27)
Bilirubin Total: 0.5 mg/dL (ref 0.0–1.2)
CO2: 26 mmol/L (ref 20–29)
Calcium: 9.9 mg/dL (ref 8.7–10.3)
Chloride: 100 mmol/L (ref 96–106)
Creatinine, Ser: 0.94 mg/dL (ref 0.57–1.00)
Globulin, Total: 2 g/dL (ref 1.5–4.5)
Glucose: 87 mg/dL (ref 70–99)
Potassium: 3.9 mmol/L (ref 3.5–5.2)
Sodium: 142 mmol/L (ref 134–144)
Total Protein: 6.7 g/dL (ref 6.0–8.5)
eGFR: 64 mL/min/1.73 (ref >59)

## 2024-05-29 LAB — LIPID PANEL W/O CHOL/HDL RATIO
Cholesterol, Total: 138 mg/dL (ref 100–199)
HDL: 49 mg/dL (ref >39)
LDL Chol Calc (NIH): 66 mg/dL (ref 0–99)
Triglycerides: 131 mg/dL (ref 0–149)
VLDL Cholesterol Cal: 23 mg/dL (ref 5–40)

## 2024-05-29 LAB — MAGNESIUM: Magnesium: 1.8 mg/dL (ref 1.6–2.3)

## 2024-05-29 NOTE — Progress Notes (Signed)
 Contacted via MyChart  Good morning Sherelle, your labs have returned and overall continue to look great.  No changes needed.  Any questions? Keep being amazing!!  Thank you for allowing me to participate in your care.  I appreciate you. Kindest regards, Zayden Hahne

## 2024-05-31 ENCOUNTER — Other Ambulatory Visit
Admission: RE | Admit: 2024-05-31 | Discharge: 2024-05-31 | Disposition: A | Discharge status: Home / Self Care | Payer: Self-pay | Source: Ambulatory Visit | Attending: Medical Genetics | Admitting: Medical Genetics

## 2024-06-02 NOTE — Telephone Encounter (Signed)
 Documented

## 2024-06-03 DIAGNOSIS — M5033 Other cervical disc degeneration, cervicothoracic region: Secondary | ICD-10-CM | POA: Diagnosis not present

## 2024-06-03 DIAGNOSIS — M9903 Segmental and somatic dysfunction of lumbar region: Secondary | ICD-10-CM | POA: Diagnosis not present

## 2024-06-03 DIAGNOSIS — M9901 Segmental and somatic dysfunction of cervical region: Secondary | ICD-10-CM | POA: Diagnosis not present

## 2024-06-03 DIAGNOSIS — M62830 Muscle spasm of back: Secondary | ICD-10-CM | POA: Diagnosis not present

## 2024-06-08 DIAGNOSIS — H04423 Chronic lacrimal canaliculitis of bilateral lacrimal passages: Secondary | ICD-10-CM | POA: Diagnosis not present

## 2024-06-08 DIAGNOSIS — H02423 Myogenic ptosis of bilateral eyelids: Secondary | ICD-10-CM | POA: Diagnosis not present

## 2024-06-11 LAB — GENECONNECT MOLECULAR SCREEN: Genetic Analysis Overall Interpretation: NEGATIVE

## 2024-06-16 ENCOUNTER — Other Ambulatory Visit: Payer: Self-pay | Admitting: Nurse Practitioner

## 2024-06-17 NOTE — Telephone Encounter (Signed)
 Requested Prescriptions  Pending Prescriptions Disp Refills   fluticasone  (FLONASE ) 50 MCG/ACT nasal spray [Pharmacy Med Name: FLUTICASONE  PROP 50 MCG SPRAY] 48 g 0    Sig: Place 2 sprays into both nostrils daily.     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 [facility] [place] Melanie DASEN, NP       Future Appointments             In 4 months Claudene Lehmann, MD [facility]

## 2024-06-21 DIAGNOSIS — H02883 Meibomian gland dysfunction of right eye, unspecified eyelid: Secondary | ICD-10-CM | POA: Insufficient documentation

## 2024-06-22 DIAGNOSIS — M21241 Flexion deformity, right finger joints: Secondary | ICD-10-CM | POA: Diagnosis not present

## 2024-06-22 DIAGNOSIS — M65331 Trigger finger, right middle finger: Secondary | ICD-10-CM | POA: Diagnosis not present

## 2024-06-22 DIAGNOSIS — E119 Type 2 diabetes mellitus without complications: Secondary | ICD-10-CM | POA: Diagnosis not present

## 2024-07-01 DIAGNOSIS — M5033 Other cervical disc degeneration, cervicothoracic region: Secondary | ICD-10-CM | POA: Diagnosis not present

## 2024-07-01 DIAGNOSIS — M9903 Segmental and somatic dysfunction of lumbar region: Secondary | ICD-10-CM | POA: Diagnosis not present

## 2024-07-01 DIAGNOSIS — M62830 Muscle spasm of back: Secondary | ICD-10-CM | POA: Diagnosis not present

## 2024-07-01 DIAGNOSIS — M9901 Segmental and somatic dysfunction of cervical region: Secondary | ICD-10-CM | POA: Diagnosis not present

## 2024-07-10 NOTE — Patient Instructions (Signed)
 Be Involved in Caring For Your Health:  Taking Medications When medications are taken as directed, they can greatly improve your health. But if they are not taken as prescribed, they may not work. In some cases, not taking them correctly can be harmful. To help ensure your treatment remains effective and safe, understand your medications and how to take them. Bring your medications to each visit for review by your provider.  Your lab results, notes, and after visit summary will be available on My Chart. We strongly encourage you to use this feature. If lab results are abnormal the clinic will contact you with the appropriate steps. If the clinic does not contact you assume the results are satisfactory. You can always view your results on My Chart. If you have questions regarding your health or results, please contact the clinic during office hours. You can also ask questions on My Chart.  We at [facility] are grateful that you chose us  to provide your care. We strive to provide evidence-based and compassionate care and are always looking for feedback. If you get a survey from the clinic please complete this so we can hear your opinions.   Nerve Damage (Peripheral Neuropathy): What to Know Peripheral neuropathy happens when there's damage to the peripheral nerves. These nerves send signals between your spinal cord and your arms and legs. You can have damage in one or more nerves. What are the causes? There are many reasons why nerves can get damaged. Damage may be caused by some diseases, such as: Diabetes. This is the most common cause. Autoimmune diseases, such as rheumatoid arthritis or lupus. These happen when your body's defense system (immune system) attacks healthy parts of your body by mistake. Inherited nerve disease. This is passed down in families. Kidney disease. Thyroid disease. Other causes include: Pressure or stress on a nerve that lasts a long time. Lack of B  vitamins from poor diet or drinking too much alcohol. Infections. Some medicines, like chemotherapy used for cancer treatment. Poisonous (toxic) substances, such as lead or mercury. Too little blood flowing to the legs. Sometimes, the cause isn't known. What are the signs or symptoms? Symptoms depend on which nerves are damaged. In your legs, hands, or arms, you may have: Loss of feeling (numbness). Tingling. Burning pain. Very sensitive skin. Weakness. Paralysis. This is when you can't move a part of your body. Clumsiness. Muscle twitching. Loss of balance. You may have trouble walking. Other symptoms can include: Not being able to control when you pee. Feeling dizzy. Problems during sex. How is this diagnosed? Your health care provider will: Ask about your symptoms and medical history. Do a physical exam. Do a neurological exam. They will check your reflexes, how you move, and what you can feel. You may need to have other tests, such as: Blood tests. Nerve tests to check how well your nerves and muscles work. Imaging tests, such as a CT scan or MRI. These are done to rule out other causes. Nerve biopsy. This is when a small piece of a nerve is removed for testing. Lumbar puncture. A small amount of the fluid that surrounds the brain and spinal cord is taken and checked in a lab. How is this treated? Treatment may include: Treating the cause of the nerve damage. Pain medicines. Other medicines that may help with pain, such as: Medicines that treat seizures. Medicines that treat depression. Pain patches or creams that are put on painful areas of skin. TENS therapy. This uses  a device that sends mild electrical pulses to your nerves. This will prevent pain signals from reaching the brain. Massage. Acupuncture. Surgery to relieve pressure on a nerve or to destroy a nerve that's causing pain. This is done only if the other treatments are not helping. You may also need  physical therapy to help improve your movement, balance, and muscle strength. You may be told to use a cane or walker if needed. Follow these instructions at home: Medicines Take your medicines only as told. You may need to take steps to help treat or prevent trouble pooping (constipation), such as: Taking medicines to help you poop. Eating foods high in fiber, like beans, whole grains, and fresh fruits and vegetables. Drinking more fluids as told. Ask your provider if it's safe to drive or use machines while taking your medicine. Lifestyle  Do not smoke, vape, or use nicotine or tobacco. Avoid or limit alcohol. Too much alcohol can be harmful to nerves and cause damage. Eat a healthy diet. Safety  Take care to avoid burning or damaging your skin if you have numbness. If you have numbness in your feet: Check your feet each day for signs of injury or infection. Watch for redness, warmth, and swelling. Wear padded socks and comfortable shoes. These help protect your feet. General instructions If you have diabetes, keep your blood sugar under control. Develop a good support system. Living with nerve damage can cause a lot of stress. Talk with a mental health therapist or join a support group. Exercise as told. Ask what things are safe for you to do at home. Work with a pain specialist to find the best way to manage your pain. Keep all follow-up visits. Your provider will check if the treatments are working and change them if needed. Where to find support The Foundation for Peripheral Neuropathy: budgetmaniac.si Where to find more information To learn more, go to: [employer] of Neurological Disorders and Stroke at basicfm.no. Click Search and type peripheral neuropathy. Find the link you need. Contact a health care provider if: Your pain treatments are not working. Your symptoms are getting worse. You have side effects from any of your  medicines. You have new symptoms. You're having a hard time coping with your condition. Get help right away if: You have a wound that's not healing. You have new weakness in an arm or leg. You've fallen or you fall often. This information is not intended to replace advice given to you by your health care provider. Make sure you discuss any questions you have with your health care provider. Document Revised: 11/06/2023 Document Reviewed: 11/06/2023 Elsevier Patient Education  2025 [employer].

## 2024-07-13 ENCOUNTER — Ambulatory Visit: Admitting: Nurse Practitioner

## 2024-07-13 ENCOUNTER — Encounter: Payer: Self-pay | Admitting: Nurse Practitioner

## 2024-07-13 VITALS — BP 115/57 | HR 85 | Temp 98.6°F | Resp 14 | Ht 63.5 in | Wt 202.0 lb

## 2024-07-13 DIAGNOSIS — G620 Drug-induced polyneuropathy: Secondary | ICD-10-CM

## 2024-07-13 DIAGNOSIS — T451X5A Adverse effect of antineoplastic and immunosuppressive drugs, initial encounter: Secondary | ICD-10-CM

## 2024-07-13 NOTE — Progress Notes (Signed)
 BP (!) 115/57 (BP Location: Right Arm, Patient Position: Sitting, Cuff Size: Large)   Pulse 85   Temp 98.6 F (37 C) (Oral)   Resp 14   Ht 5' 3.5 (1.613 m)   Wt 202 lb (91.6 kg)   SpO2 98%   BMI 35.22 kg/m    Subjective:    Patient ID: [name], female    DOB: December 14, 1950, 73 y.o.   MRN: [id number]  HPI: [name] is a 73 y.o. female  Chief Complaint  Patient presents with   Peripheral Neuropathy    Feels it is doing much better since Lyrica  dose.    NEUROPATHY At visit on 05/28/24 increase Lyrica  to 75 MG TID. Symptoms have much improved. Neuropathy status: controlled  Satisfied with current treatment?: yes Medication side effects: no Medication compliance:  good compliance Location: hands mainly due to past chemo Pain: yes Severity: mild  Quality:  dull, tingling, and pins and needles Frequency: intermittent Bilateral: yes Symmetric: yes Numbness: no Decreased sensation: no Weakness: no Context: better Alleviating factors: Lyrica  Aggravating factors: unknown Treatments attempted: Lyrica   Relevant past medical, surgical, family and social history reviewed and updated as indicated. Interim medical history since our last visit reviewed. Allergies and medications reviewed and updated.  Review of Systems  Constitutional:  Negative for activity change, appetite change, diaphoresis, fatigue and fever.  Respiratory:  Negative for cough, chest tightness and shortness of breath.   Cardiovascular:  Negative for chest pain, palpitations and leg swelling.  Gastrointestinal: Negative.   Neurological: Negative.   Psychiatric/Behavioral: Negative.      Per HPI unless specifically indicated above     Objective:    BP (!) 115/57 (BP Location: Right Arm, Patient Position: Sitting, Cuff Size: Large)   Pulse 85   Temp 98.6 F (37 C) (Oral)   Resp 14   Ht 5' 3.5 (1.613 m)   Wt 202 lb (91.6 kg)   SpO2 98%   BMI 35.22 kg/m   Wt Readings from Last 3 Encounters:   07/13/24 202 lb (91.6 kg)  05/28/24 198 lb (89.8 kg)  03/04/24 206 lb 12.8 oz (93.8 kg)    Physical Exam Vitals and nursing note reviewed.  Constitutional:      General: She is awake. She is not in acute distress.    Appearance: She is well-developed and well-groomed. She is obese. She is not ill-appearing or toxic-appearing.  HENT:     Head: Normocephalic.     Right Ear: Hearing, ear canal and external ear normal.     Left Ear: Hearing, ear canal and external ear normal.     Mouth/Throat:     Mouth: Mucous membranes are moist.  Eyes:     General: Lids are normal.        Right eye: No discharge.        Left eye: No discharge.     Conjunctiva/sclera: Conjunctivae normal.     Pupils: Pupils are equal, round, and reactive to light.  Neck:     Thyroid: No thyromegaly.     Vascular: No carotid bruit.  Cardiovascular:     Rate and Rhythm: Normal rate and regular rhythm.     Heart sounds: Normal heart sounds. No murmur heard.    No gallop.  Pulmonary:     Effort: Pulmonary effort is normal. No accessory muscle usage or respiratory distress.     Breath sounds: Normal breath sounds.  Abdominal:     General: Bowel sounds are  normal.     Palpations: Abdomen is soft.  Musculoskeletal:     Cervical back: Normal range of motion and neck supple.     Right lower leg: No edema.     Left lower leg: No edema.  Lymphadenopathy:     Cervical: No cervical adenopathy.  Skin:    General: Skin is warm and dry.  Neurological:     Mental Status: She is alert and oriented to person, place, and time.     Cranial Nerves: Cranial nerves 2-12 are intact.  Psychiatric:        Attention and Perception: Attention normal.        Mood and Affect: Mood normal.        Speech: Speech normal.        Behavior: Behavior normal. Behavior is cooperative.        Thought Content: Thought content normal.     Results for orders placed or performed during the hospital encounter of 05/31/24  [facility]  Molecular Screen - Blood ([facility])   Collection Time: 05/31/24 10:26 AM  Result Value Ref Range   Genetic Analysis Overall Interpretation Negative    Genetic Disease Assessed      This is a screening test and does not detect all pathogenic or likely pathogenic variant(s) in the tested genes; diagnostic testing is recommended for individuals with a personal or family history of heart disease or hereditary cancer. [employer] [facility] One  Population Screen is a screening test that analyzes 11 genes related to hereditary breast and ovarian cancer (HBOC) syndrome, Lynch syndrome, and familial hypercholesterolemia. This test only reports clinically significant pathogenic and likely  pathogenic variants but does not report variants of uncertain significance (VUS). In addition, analysis of the PMS2 gene excludes exons 11-15, which overlap with a known pseudogene (PMS2CL).    Genetic Analysis Report      No pathogenic or likely pathogenic variants were detected in the genes analyzed by this test.Genetic test results should be interpreted in the context of an individual's personal medical and family history. Alteration to medical management is NOT  recommended based solely on this result. Clinical correlation is advised.Additional Considerations- This is a screening test; individuals may still carry pathogenic or likely pathogenic variant(s) in the tested genes that are not detected by this test.-  For individuals at risk for these or other related conditions based on factors including personal or family history, diagnostic testing is recommended.- The absence of pathogenic or likely pathogenic variant(s) in the analyzed genes, while reassuring,  does not eliminate the possibility of a hereditary condition; there are other variants and genes associated with heart disease and hereditary cancer that are not included in this test.    Genes Tested See Notes    Disclaimer See Notes    Sequencing Location  See Notes    Interpretation Methods and Limitations See Notes       Assessment & Plan:   Problem List Items Addressed This Visit       Nervous and Auditory   Chemotherapy-induced neuropathy - Primary   Chronic, ongoing.  Continue Lyrica  75 MG TID which is offering her more benefit, she reports the medication has never made her tired and will alert provider if any side effects.  Renal dosed based on CrCl 63.  Consider neurology or PT in the future.        Follow up plan: Return in about 7 weeks (around 09/03/2024) for T2DM, HTN/HLD.

## 2024-07-13 NOTE — Assessment & Plan Note (Signed)
 Chronic, ongoing.  Continue Lyrica  75 MG TID which is offering her more benefit, she reports the medication has never made her tired and will alert provider if any side effects.  Renal dosed based on CrCl 63.  Consider neurology or PT in the future.

## 2024-07-14 DIAGNOSIS — H35372 Puckering of macula, left eye: Secondary | ICD-10-CM | POA: Diagnosis not present

## 2024-07-14 DIAGNOSIS — H43823 Vitreomacular adhesion, bilateral: Secondary | ICD-10-CM | POA: Diagnosis not present

## 2024-07-29 DIAGNOSIS — M62830 Muscle spasm of back: Secondary | ICD-10-CM | POA: Diagnosis not present

## 2024-07-29 DIAGNOSIS — M9903 Segmental and somatic dysfunction of lumbar region: Secondary | ICD-10-CM | POA: Diagnosis not present

## 2024-07-29 DIAGNOSIS — M5033 Other cervical disc degeneration, cervicothoracic region: Secondary | ICD-10-CM | POA: Diagnosis not present

## 2024-07-29 DIAGNOSIS — M9901 Segmental and somatic dysfunction of cervical region: Secondary | ICD-10-CM | POA: Diagnosis not present

## 2024-08-09 ENCOUNTER — Encounter

## 2024-08-16 ENCOUNTER — Ambulatory Visit

## 2024-08-16 DIAGNOSIS — Z950 Presence of cardiac pacemaker: Secondary | ICD-10-CM | POA: Diagnosis not present

## 2024-08-17 LAB — CUP PACEART REMOTE DEVICE CHECK
Battery Remaining Longevity: 102 mo
Battery Remaining Percentage: 100 %
Brady Statistic RA Percent Paced: 0 %
Brady Statistic RV Percent Paced: 14 %
Date Time Interrogation Session: 20251222001200
Implantable Lead Connection Status: 753985
Implantable Lead Connection Status: 753985
Implantable Lead Implant Date: 20040917
Implantable Lead Implant Date: 20040917
Implantable Lead Location: 753859
Implantable Lead Location: 753860
Implantable Lead Model: 4479
Implantable Lead Model: 5076
Implantable Lead Serial Number: 312732
Implantable Pulse Generator Implant Date: 20241205
Lead Channel Impedance Value: 476 Ohm
Lead Channel Impedance Value: 502 Ohm
Lead Channel Pacing Threshold Amplitude: 0.5 V
Lead Channel Pacing Threshold Amplitude: 1 V
Lead Channel Pacing Threshold Pulse Width: 0.4 ms
Lead Channel Pacing Threshold Pulse Width: 0.4 ms
Lead Channel Setting Pacing Amplitude: 2 V
Lead Channel Setting Pacing Amplitude: 2.5 V
Lead Channel Setting Pacing Pulse Width: 0.4 ms
Lead Channel Setting Sensing Sensitivity: 2.5 mV
Pulse Gen Serial Number: 823153
Zone Setting Status: 755011

## 2024-08-18 NOTE — Progress Notes (Signed)
 Remote PPM Transmission

## 2024-08-21 ENCOUNTER — Ambulatory Visit: Payer: Self-pay | Admitting: Cardiology

## 2024-08-28 NOTE — Patient Instructions (Signed)

## 2024-09-01 ENCOUNTER — Encounter: Payer: Self-pay | Admitting: Nurse Practitioner

## 2024-09-01 ENCOUNTER — Ambulatory Visit: Admitting: Nurse Practitioner

## 2024-09-01 VITALS — BP 120/72 | HR 87 | Temp 97.6°F | Resp 18 | Ht 63.5 in | Wt 192.6 lb

## 2024-09-01 DIAGNOSIS — Z853 Personal history of malignant neoplasm of breast: Secondary | ICD-10-CM | POA: Diagnosis not present

## 2024-09-01 DIAGNOSIS — E1159 Type 2 diabetes mellitus with other circulatory complications: Secondary | ICD-10-CM | POA: Diagnosis not present

## 2024-09-01 DIAGNOSIS — E66811 Obesity, class 1: Secondary | ICD-10-CM | POA: Diagnosis not present

## 2024-09-01 DIAGNOSIS — N1831 Chronic kidney disease, stage 3a: Secondary | ICD-10-CM | POA: Diagnosis not present

## 2024-09-01 DIAGNOSIS — E119 Type 2 diabetes mellitus without complications: Secondary | ICD-10-CM

## 2024-09-01 DIAGNOSIS — E1169 Type 2 diabetes mellitus with other specified complication: Secondary | ICD-10-CM | POA: Diagnosis not present

## 2024-09-01 DIAGNOSIS — Z950 Presence of cardiac pacemaker: Secondary | ICD-10-CM

## 2024-09-01 DIAGNOSIS — G620 Drug-induced polyneuropathy: Secondary | ICD-10-CM

## 2024-09-01 DIAGNOSIS — I152 Hypertension secondary to endocrine disorders: Secondary | ICD-10-CM | POA: Diagnosis not present

## 2024-09-01 DIAGNOSIS — Z7985 Long-term (current) use of injectable non-insulin antidiabetic drugs: Secondary | ICD-10-CM

## 2024-09-01 DIAGNOSIS — G8929 Other chronic pain: Secondary | ICD-10-CM

## 2024-09-01 DIAGNOSIS — M25512 Pain in left shoulder: Secondary | ICD-10-CM

## 2024-09-01 LAB — BAYER DCA HB A1C WAIVED: HB A1C (BAYER DCA - WAIVED): 5.6 % (ref 4.8–5.6)

## 2024-09-01 LAB — MICROALBUMIN, URINE WAIVED
Creatinine, Urine Waived: 200 mg/dL (ref 10–300)
Microalb, Ur Waived: 80 mg/L — ABNORMAL HIGH (ref 0–19)

## 2024-09-01 MED ORDER — METHYLPREDNISOLONE 4 MG PO TBPK: ORAL_TABLET | ORAL | 0 refills | Status: AC

## 2024-09-01 MED ORDER — SUMATRIPTAN SUCCINATE 100 MG PO TABS: 100.0000 mg | ORAL_TABLET | ORAL | 12 refills | Status: AC | PRN

## 2024-09-01 NOTE — Assessment & Plan Note (Signed)
Continue collaboration with oncology as needed.  Annual mammograms. 

## 2024-09-01 NOTE — Assessment & Plan Note (Signed)
 Chronic, ongoing.  Continue Lyrica  75 MG TID which is offering her more benefit, she reports the medication has never made her tired and will alert provider if any side effects.  Renal dosed based on CrCl 63 via recent labs.  Consider neurology or PT in the future.

## 2024-09-01 NOTE — Assessment & Plan Note (Signed)
 BMI 33.58 with T2DM and HTN, continues to lose weight with Ozempic .  Recommended eating smaller high protein, low fat meals more frequently and exercising 30 mins a day 5 times a week with a goal of 10-15 lb weight loss in the next 3 months. Patient voiced their understanding and motivation to adhere to these recommendations.

## 2024-09-01 NOTE — Assessment & Plan Note (Signed)
 Chronic with ongoing acute flare for over a month. Will get her into ortho for further assessment and recommendations. Send in steroid taper to assist at present, educated on this and aware to monitor sugars closely. No red flags on exam. Continue at home regimen and stretches.

## 2024-09-01 NOTE — Assessment & Plan Note (Signed)
 Chronic, ongoing.  Continue current medication regimen and adjust as needed. Lipid panel today.

## 2024-09-01 NOTE — Assessment & Plan Note (Signed)
 Chronic, stable with A1c 5.6% today. Urine ALB 24 December 2023, recheck today. Will continue Jardiance  25 MG daily which has helped her magnesium  levels and diabetes + continue Ozempic  1 MG weekly to further assist with her diabetes and weight loss journey.  Educated her at length on this medication. No family history of thyroid cancer (MTC, MEN 2, thyroid cell tumors) or pancreatitis. Continue to focus heavily on diabetic diet and exercise.  Check sugars at home at least 3 days a week. - Eye and foot exams up to date - Statin on board, consider ACE or ARB in future - Vaccinations up to date

## 2024-09-01 NOTE — Assessment & Plan Note (Signed)
 Chronic, stable.  BP at goal at office and home.  Continue current medication regimen and adjust as needed + continue collaboration with cardiology.  Recent note reviewed.  LABS: urine ALB and CMP.  Urine ALB 24 December 2023, recheck today.  Recommend she continue to monitor BP at home and focus on DASH diet.

## 2024-09-01 NOTE — Assessment & Plan Note (Signed)
 Refer to diabetes plan of care for further, taking Ozempic .

## 2024-09-01 NOTE — Assessment & Plan Note (Signed)
 Chronic, ongoing.  Stable levels at this time with no decline.  Ensure plenty of hydration daily and continue Jardiance.

## 2024-09-01 NOTE — Assessment & Plan Note (Signed)
Continue collaboration with cardiology for checks. 

## 2024-09-01 NOTE — Assessment & Plan Note (Signed)
 Ongoing and stable.  Recent level normal. Will follow-up with oncology as needed. Magnesium  rechecked today. Has had stable levels since being on Jardiance , since 05/15/21.

## 2024-09-01 NOTE — Progress Notes (Signed)
 "  BP 120/72 (BP Location: Left Arm, Patient Position: Sitting, Cuff Size: Large)   Pulse 87   Temp 97.6 F (36.4 C) (Oral)   Resp 18   Ht 5' 3.5 (1.613 m)   Wt 192 lb 9.6 oz (87.4 kg)   SpO2 100%   BMI 33.58 kg/m    Subjective:    Patient ID: [name], female    DOB: 06/23/1951, 74 y.o.   MRN: [id number]  HPI: [name] is a 74 y.o. female  Chief Complaint  Patient presents with   Follow-up    Here for follow up want to talk about bursitis in left shoulder.   DIABETES October A1c remained stable at 5.2%. Takes Jardiance  25 MG daily + Ozempic  1 MG weekly.  Previously took Metformin  but switched to SGLT2 for heart health. Continues to have weight loss with GLP1 on board. 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 Saw cardiology last on 10/31/23. Current medications: Atenolol , Atorvastatin . Satisfied with current treatment? yes Duration of hypertension: chronic BP monitoring frequency: once or twice a week BP range: <130/80 BP medication side effects: no Past BP meds:  Duration of hyperlipidemia: chronic Cholesterol medication side effects: no Cholesterol supplements: none Past cholesterol medications:  Medication compliance: excellent compliance Aspirin: no Recent stressors: yes Recurrent headaches: no Visual changes: no Palpitations: no Dyspnea: no Chest pain: no Lower extremity  edema: yes, occasional to left side at baseline Dizzy/lightheaded: no   CHRONIC KIDNEY DISEASE (Stage 3a) CKD status: stable Medications renally dose: yes Previous renal evaluation: no Pneumovax:  Up to Date Influenza Vaccine:  Up to Date   NEUROPATHY & HISTORY OF BREAST CA Continues to take Lyrica  75 MG TID for neuropathy after breast cancer treatment, this continues to offer benefit. Received magnesium  infusions in past, but levels have been stable with Jardiance  on board.  Left shoulder has spur under shoulder blade, having more pain with lifting arm upwards and this is worsening, hard to lift arm even with eating. No past surgeries or injuries to shoulder. Pain to shoulder is 6-7/10, this morning when woke up a 20/10. Dull, sharp, aching pain. Worse with movement, better with rest. Takes muscle relaxer and patches which help. She has had similar issues with this before. Neuropathy status: controlled  Satisfied with current treatment?: yes Medication side effects: no Medication compliance:  excellent compliance Location: all over Pain: yes to shoulder only at present Severity: moderate  Quality:  sharp, burning, tender, tearing, throbbing, tingling, creeping, and pins and needles Frequency: intermittent Bilateral: yes Symmetric: yes Numbness: yes Decreased sensation: yes Weakness: no Context: stable Alleviating factors: Lyrica  Aggravating factors: unknown  Relevant past medical, surgical, family and social history reviewed and updated as indicated. Interim medical history since our last visit reviewed. Allergies and medications reviewed and updated.  Review of Systems  Constitutional:  Negative for activity change, appetite change, diaphoresis, fatigue and fever.  Respiratory:  Negative for cough, chest tightness and shortness of breath.  Cardiovascular:  Negative for chest pain, palpitations and leg swelling.  Gastrointestinal: Negative.   Neurological: Negative.    Psychiatric/Behavioral: Negative.     Per HPI unless specifically indicated above     Objective:    BP 120/72 (BP Location: Left Arm, Patient Position: Sitting, Cuff Size: Large)   Pulse 87   Temp 97.6 F (36.4 C) (Oral)   Resp 18   Ht 5' 3.5 (1.613 m)   Wt 192 lb 9.6 oz (87.4 kg)   SpO2 100%   BMI 33.58 kg/m   Wt Readings from Last 3 Encounters:  09/01/24 192 lb 9.6 oz (87.4 kg)  07/13/24 202 lb (91.6 kg)  05/28/24 198 lb (89.8 kg)    Physical Exam Vitals and nursing note reviewed.  Constitutional:      General: She is awake. She is not in acute distress.    Appearance: She is well-developed and well-groomed. She is obese. She is not ill-appearing or toxic-appearing.  HENT:     Head: Normocephalic.     Right Ear: Hearing, ear canal and external ear normal.     Left Ear: Hearing, ear canal and external ear normal.     Mouth/Throat:     Mouth: Mucous membranes are moist.  Eyes:     General: Lids are normal.        Right eye: No discharge.        Left eye: No discharge.     Conjunctiva/sclera: Conjunctivae normal.     Pupils: Pupils are equal, round, and reactive to light.  Neck:     Thyroid: No thyromegaly.     Vascular: No carotid bruit.  Cardiovascular:     Rate and Rhythm: Normal rate and regular rhythm.     Heart sounds: Normal heart sounds. No murmur heard.    No gallop.  Pulmonary:     Effort: Pulmonary effort is normal. No accessory muscle usage or respiratory distress.     Breath sounds: Normal breath sounds. No decreased breath sounds, wheezing or rales.  Abdominal:     General: Bowel sounds are normal. There is no distension.     Palpations: Abdomen is soft.     Tenderness: There is no abdominal tenderness.  Musculoskeletal:     Right shoulder: Normal.     Left shoulder: Tenderness present. No swelling, bony tenderness or crepitus. Decreased range of motion. Decreased strength.     Cervical back: Normal range of motion and neck supple.     Right  lower leg: No edema.     Left lower leg: No edema.  Lymphadenopathy:     Cervical: No cervical adenopathy.  Skin:    General: Skin is warm and dry.  Neurological:     Mental Status: She is alert and oriented to person, place, and time.     Cranial Nerves: Cranial nerves 2-12 are intact.  Psychiatric:        Attention and Perception: Attention normal.        Mood and Affect: Mood normal.        Speech: Speech normal.        Behavior: Behavior normal. Behavior is cooperative.        Thought Content: Thought content normal.    Results for orders placed or performed in visit on 08/16/24  CUP PACEART REMOTE DEVICE CHECK   Collection Time: 08/16/24 12:12 AM  Result Value Ref Range   Date Time Interrogation Session [phone]    Pulse Generator Manufacturer BOST  Pulse Gen Model L311 ACCOLADE MRI    Pulse Gen Serial Number [id number]    Clinic Name [facility]    Implantable Pulse Generator Type Implantable Pulse Generator    Implantable Pulse Generator Implant Date [id number]    Implantable Lead Manufacturer BOST    Implantable Lead Model 4479 Sandre PONCE Emeterio YASMIN    Implantable Lead Serial Number [id number]    Implantable Lead Implant Date [id number]    Implantable Lead Location [id number]    Implantable Lead Connection Status [id number]    Implantable Lead Manufacturer MERM    Implantable Lead Model 5076 CapSureFix Novus    Implantable Lead Serial Number [id number]    Implantable Lead Implant Date [id number]    Implantable Lead Location [id number]    Implantable Lead Connection Status [phone]    Lead Channel Setting Sensing Sensitivity 2.5 mV   Lead Channel Setting Sensing Adaptation Mode Fixed Pacing    Lead Channel Setting Pacing Amplitude 2.0 V   Lead Channel Setting Pacing Pulse Width 0.4 ms   Lead Channel Setting Pacing Amplitude 2.5 V   Zone Setting Status [id number]    Lead Channel Impedance Value 476 ohm   Lead Channel Pacing Threshold Amplitude 0.5 V   Lead Channel Pacing Threshold  Pulse Width 0.4 ms   Lead Channel Impedance Value 502 ohm   Lead Channel Pacing Threshold Amplitude 1.0 V   Lead Channel Pacing Threshold Pulse Width 0.4 ms   Battery Status BOS    Battery Remaining Longevity 102 mo   Battery Remaining Percentage 100 %   Brady Statistic RA Percent Paced 0 %   Brady Statistic RV Percent Paced 14 %      Assessment & Plan:   Problem List Items Addressed This Visit       Cardiovascular and Mediastinum   Hypertension associated with diabetes (HCC)   Chronic, stable.  BP at goal at office and home.  Continue current medication regimen and adjust as needed + continue collaboration with cardiology.  Recent note reviewed.  LABS: urine ALB and CMP.  Urine ALB 24 December 2023, recheck today.  Recommend she continue to monitor BP at home and focus on DASH diet.        Relevant Orders   Bayer DCA Hb A1c Waived   Microalbumin, Urine Waived   Comprehensive metabolic panel with GFR     Endocrine   Hyperlipidemia associated with type 2 diabetes mellitus (HCC)   Chronic, ongoing.  Continue current medication regimen and adjust as needed.  Lipid panel today.      Relevant Orders   Bayer DCA Hb A1c Waived   Comprehensive metabolic panel with GFR   Lipid Panel w/o Chol/HDL Ratio   Diabetes mellitus treated with injections of non-insulin medication (HCC)   Refer to diabetes plan of care for further, taking Ozempic .      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 Urine ALB 24 December 2023, recheck today. Will continue Jardiance  25 MG daily which has helped her magnesium  levels and diabetes + continue Ozempic  1 MG weekly to further assist with her diabetes and weight loss journey.  Educated her at length on this medication. No family history of thyroid cancer (MTC, MEN 2, thyroid cell tumors) or pancreatitis. Continue to focus  heavily on diabetic diet and exercise.  Check sugars at home at least  3 days a week. - Eye and foot exams up to date - Statin on board, consider ACE or ARB in future - Vaccinations up to date      Relevant Orders   Bayer DCA Hb A1c Waived   Microalbumin, Urine Waived     Nervous and Auditory   Chemotherapy-induced neuropathy   Chronic, ongoing.  Continue Lyrica  75 MG TID which is offering her more benefit, she reports the medication has never made her tired and will alert provider if any side effects.  Renal dosed based on CrCl 63 via recent labs.  Consider neurology or PT in the future.        Genitourinary   Chronic kidney disease, stage 3a (HCC)   Chronic, ongoing.  Stable levels at this time with no decline.  Ensure plenty of hydration daily and continue Jardiance .        Other   PPM-[employer]   Continue collaboration with cardiology for checks.      Obesity   BMI 33.58 with T2DM and HTN, continues to lose weight with Ozempic .  Recommended eating smaller high protein, low fat meals more frequently and exercising 30 mins a day 5 times a week with a goal of 10-15 lb weight loss in the next 3 months. Patient voiced their understanding and motivation to adhere to these recommendations.       Left shoulder pain   Chronic with ongoing acute flare for over a month. Will get her into ortho for further assessment and recommendations. Send in steroid taper to assist at present, educated on this and aware to monitor sugars closely. No red flags on exam. Continue at home regimen and stretches.      Relevant Orders   Ambulatory referral to Orthopedic Surgery   Hypomagnesemia   Ongoing and stable.  Recent level normal. Will follow-up with oncology as needed. Magnesium  rechecked today. Has had stable levels since being on Jardiance , since 05/15/21.      Relevant Orders   Magnesium    History of breast cancer   Continue collaboration with oncology as needed.  Annual  mammograms.        Follow up plan: Return in about 3 months (around 12/03/2024) for Annual Physical after April 9th, 2026.      "

## 2024-09-02 ENCOUNTER — Ambulatory Visit: Payer: Self-pay | Admitting: Nurse Practitioner

## 2024-09-02 LAB — LIPID PANEL W/O CHOL/HDL RATIO
Cholesterol, Total: 106 mg/dL (ref 100–199)
HDL: 39 mg/dL — ABNORMAL LOW
LDL Chol Calc (NIH): 45 mg/dL (ref 0–99)
Triglycerides: 122 mg/dL (ref 0–149)
VLDL Cholesterol Cal: 22 mg/dL (ref 5–40)

## 2024-09-02 LAB — COMPREHENSIVE METABOLIC PANEL WITH GFR
ALT: 19 IU/L (ref 0–32)
AST: 21 IU/L (ref 0–40)
Albumin: 4.1 g/dL (ref 3.8–4.8)
Alkaline Phosphatase: 145 IU/L — ABNORMAL HIGH (ref 49–135)
BUN/Creatinine Ratio: 33 — ABNORMAL HIGH (ref 12–28)
BUN: 25 mg/dL (ref 8–27)
Bilirubin Total: 0.3 mg/dL (ref 0.0–1.2)
CO2: 23 mmol/L (ref 20–29)
Calcium: 9.4 mg/dL (ref 8.7–10.3)
Chloride: 104 mmol/L (ref 96–106)
Creatinine, Ser: 0.76 mg/dL (ref 0.57–1.00)
Globulin, Total: 2.1 g/dL (ref 1.5–4.5)
Glucose: 82 mg/dL (ref 70–99)
Potassium: 4.2 mmol/L (ref 3.5–5.2)
Sodium: 144 mmol/L (ref 134–144)
Total Protein: 6.2 g/dL (ref 6.0–8.5)
eGFR: 82 mL/min/1.73

## 2024-09-02 LAB — MAGNESIUM: Magnesium: 2 mg/dL (ref 1.6–2.3)

## 2024-09-02 NOTE — Progress Notes (Signed)
 Contacted via MyChart  Good morning [name], your labs have returned: - Kidney function, creatinine and eGFR, remains normal, as is liver function, AST and ALT.  - Remainder of labs stable. No changes needed. Any questions? Keep being amazing!!  Thank you for allowing me to participate in your care.  I appreciate you. Kindest regards, Lumen Brinlee

## 2024-09-08 ENCOUNTER — Other Ambulatory Visit: Payer: Self-pay | Admitting: Orthopedic Surgery

## 2024-09-08 DIAGNOSIS — M7542 Impingement syndrome of left shoulder: Secondary | ICD-10-CM

## 2024-09-13 ENCOUNTER — Other Ambulatory Visit: Payer: Self-pay | Admitting: Nurse Practitioner

## 2024-09-14 ENCOUNTER — Other Ambulatory Visit: Payer: Self-pay | Admitting: Orthopedic Surgery

## 2024-09-14 DIAGNOSIS — M7542 Impingement syndrome of left shoulder: Secondary | ICD-10-CM

## 2024-09-14 NOTE — Telephone Encounter (Signed)
 Requested medication (s) are due for refill today: yes  Requested medication (s) are on the active medication list: yes  Last refill:  05/28/24  Future visit scheduled: yes  Notes to clinic:  Unable to refill per protocol, cannot delegate.      Requested Prescriptions  Pending Prescriptions Disp Refills   pregabalin  (LYRICA ) 75 MG capsule [Pharmacy Med Name: PREGABALIN  75 MG CAPSULE] 90 capsule 0    Sig: Take 1 capsule (75 mg total) by mouth 3 (three) times daily.     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)   [place] [facility] [place], [place] T, NP   2 months ago Chemotherapy-induced neuropathy   [place] [facility] [place], [place] T, NP   3 months ago 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

## 2024-09-16 ENCOUNTER — Ambulatory Visit
Admission: RE | Admit: 2024-09-16 | Discharge: 2024-09-16 | Disposition: A | Discharge status: Home / Self Care | Source: Ambulatory Visit | Attending: Orthopedic Surgery | Admitting: Orthopedic Surgery

## 2024-09-16 DIAGNOSIS — M7542 Impingement syndrome of left shoulder: Secondary | ICD-10-CM | POA: Diagnosis present

## 2024-09-23 ENCOUNTER — Ambulatory Visit: Admitting: Emergency Medicine

## 2024-09-23 VITALS — Ht 63.5 in | Wt 186.0 lb

## 2024-09-23 DIAGNOSIS — Z0000 Encounter for general adult medical examination without abnormal findings: Secondary | ICD-10-CM

## 2024-09-23 NOTE — Progress Notes (Signed)
 "  Chief Complaint  Patient presents with   Medicare Wellness     Subjective:   [name] is a 74 y.o. female who presents for a Medicare Annual Wellness Visit.  Visit info / Clinical Intake: Medicare Wellness Visit Type:: Subsequent Annual Wellness Visit Persons participating in visit and providing information:: patient Medicare Wellness Visit Mode:: Video Since this visit was completed virtually, some vitals may be partially provided or unavailable. Missing vitals are due to the limitations of the virtual format.: Documented vitals are patient reported If Telephone or Video please confirm:: I connected with patient using audio/video enable telemedicine. I verified patient identity with two identifiers, discussed telehealth limitations, and patient agreed to proceed. Patient Location:: home Provider Location:: clinic office Interpreter Needed?: No Pre-visit prep was completed: yes AWV questionnaire completed by patient prior to visit?: yes Date:: 09/19/24 Living arrangements:: (!) lives alone Patient's Overall Health Status Rating: good Typical amount of pain: some Does pain affect daily life?: no Are you currently prescribed opioids?: (!) yes  Dietary Habits and Nutritional Risks How many meals a day?: 3 Eats fruit and vegetables daily?: yes Most meals are obtained by: preparing own meals In the last 2 weeks, have you had any of the following?: none Diabetic:: (!) yes How often do you check your BS?: 1 (FBS 97 per patient) Would you like to be referred to a Nutritionist or for Diabetic Management? : no  Functional Status Activities of Daily Living (to include ambulation/medication): Independent Ambulation: Independent with device- listed below Home Assistive Devices/Equipment: Eyeglasses Medication Administration: Independent Home Management (perform basic housework or laundry): Independent Manage your own finances?: yes Primary transportation is: driving Concerns  about vision?: no *vision screening is required for WTM* Concerns about hearing?: no  Fall Screening Falls in the past year?: 0 Number of falls in past year: 0 Was there an injury with Fall?: 0 Fall Risk Category Calculator: 0 Patient Fall Risk Level: Low Fall Risk  Fall Risk Patient at Risk for Falls Due to: No Fall Risks Fall risk Follow up: Falls evaluation completed  Home and Transportation Safety: All rugs have non-skid backing?: yes All stairs or steps have railings?: yes Grab bars in the bathtub or shower?: yes Have non-skid surface in bathtub or shower?: yes Good home lighting?: yes Regular seat belt use?: yes Hospital stays in the last year:: no  Cognitive Assessment Difficulty concentrating, remembering, or making decisions? : no Will 6CIT or Mini Cog be Completed: yes What year is it?: 0 points What month is it?: 0 points Give patient an address phrase to remember (5 components): [address] [place] About what time is it?: 0 points Count backwards from 20 to 1: 0 points Say the months of the year in reverse: 0 points Repeat the address phrase from earlier: 0 points 6 CIT Score: 0 points  Advance Directives (For Healthcare) Does Patient Have a Medical Advance Directive?: Yes Does patient want to make changes to medical advance directive?: No - Patient declined Type of Advance Directive: Healthcare Power of [place]; Living will Copy of Healthcare Power of Attorney in Chart?: No - copy requested Copy of Living Will in Chart?: No - copy requested  Reviewed/Updated  Reviewed/Updated: Reviewed All (Medical, Surgical, Family, Medications, Allergies, Care Teams, Patient Goals)    Allergies (verified) Penicillins, Levofloxacin, Other, and Latex   Current Medications (verified) Outpatient Encounter Medications as of 09/23/2024  Medication Sig   acetaminophen  (TYLENOL ) 500 MG tablet Take 1,000 mg by mouth every  6 (six) hours as needed for moderate pain (pain  score 4-6).   atenolol  (TENORMIN ) 50 MG tablet Take 1 tablet (50 mg total) by mouth daily.   atorvastatin  (LIPITOR) 40 MG tablet Take 1 tablet (40 mg total) by mouth daily.   Cholecalciferol (VITAMIN D3) 50 MCG (2000 UT) TABS Take 1 tablet by mouth daily.   CINNAMON PO Take 2 capsules by mouth in the morning.   CRANBERRY PO Take 15,000 mg by mouth daily.   cyanocobalamin (VITAMIN B12) 1000 MCG tablet Take 1,000 mcg by mouth every other day.   empagliflozin  (JARDIANCE ) 25 MG TABS tablet Take 1 tablet (25 mg total) by mouth daily before breakfast.   erythromycin ophthalmic ointment Place 1 Application into both eyes 2 (two) times daily.   esomeprazole (NEXIUM) 20 MG capsule Take 20 mg by mouth every morning.   fluticasone  (FLONASE ) 50 MCG/ACT nasal spray Place 2 sprays into both nostrils daily.   glucose blood (ACCU-CHEK AVIVA PLUS) test strip TEST BLOOD SUGAR ONCE DAILY   levocetirizine (XYZAL ) 5 MG tablet Take 5 mg by mouth every evening.   Magnesium  Cl-Calcium  Carbonate (SLOW-MAG PO) Take 2 tablets by mouth daily. (Patient taking differently: Take 2 tablets by mouth daily. Taking 5 tablets once per day)   montelukast  (SINGULAIR ) 10 MG tablet Take 1 tablet (10 mg total) by mouth at bedtime.   Multiple Vitamins-Minerals (PRESERVISION AREDS 2) CAPS Take by mouth 2 (two) times daily.   nystatin  (MYCOSTATIN /NYSTOP ) powder Apply 1 Application topically 2 (two) times daily. (Patient taking differently: Apply 1 Application topically 2 (two) times daily. PRN)   pregabalin  (LYRICA ) 75 MG capsule Take 1 capsule (75 mg total) by mouth 3 (three) times daily.   pyridOXINE (VITAMIN B6) 100 MG tablet Take 100 mg by mouth 2 (two) times daily.   Semaglutide , 1 MG/DOSE, 4 MG/3ML SOPN Inject 1 mg as directed once a week.   SUMAtriptan  (IMITREX ) 100 MG tablet Take 1 tablet (100 mg total) by mouth as needed.   tiZANidine  (ZANAFLEX ) 2 MG tablet Take 1 tablet (2 mg total) by mouth every 8 (eight) hours as needed for  muscle spasms.   traMADol (ULTRAM) 50 MG tablet Take 50 mg by mouth every 6 (six) hours as needed.   methylPREDNISolone  (MEDROL  DOSEPAK) 4 MG TBPK tablet Take as instructed on packaging. (Patient not taking: Reported on 09/23/2024)   No facility-administered encounter medications on file as of 09/23/2024.    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   Ovarian cyst 11/06/2020   Pacemaker [employer] 2004   Personal history of chemotherapy 2009   left breast ca   Personal history of radiation therapy 2009   left breast   Sinus tachycardia    Type 2 diabetes mellitus (HCC) 06/19/2015   Past Surgical History:  Procedure Laterality Date  ANKLE SURGERY Left    x 3   BREAST EXCISIONAL BIOPSY Left 2009    Stage IIa, ER/PR negative, 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,   BREAST LUMPECTOMY Left 2009   chemo and rad tx   CARPAL TUNNEL RELEASE     CESAREAN SECTION  1983   CHOLECYSTECTOMY     COLONOSCOPY WITH PROPOFOL  N/A 01/13/2023   Procedure: COLONOSCOPY WITH PROPOFOL ;  Surgeon: Therisa Bi, MD;  Location: [facility] ENDOSCOPY;  Service: Gastroenterology;  Laterality: N/A;   EYE SURGERY Left 12/2018   cataracts-Dr.Shah in [place]    EYE SURGERY Right 01/2019   cataracts- Dr[facility]Shah in [facility]   GALLBLADDER SURGERY  1993   INSERT / REPLACE / REMOVE PACEMAKER  2011   revision @ [facility]   PACEMAKER INSERTION   05/13/2003   Guidant Alonso - DR   PPM GENERATOR CHANGEOUT N/A 07/31/2023   Procedure: PPM GENERATOR CHANGEOUT;  Surgeon: Fernande Elspeth BROCKS, MD;  Location[facility] INVASIVE CV LAB;  Service: Cardiovascular;  Laterality: N/A;   ROBOTIC ASSISTED SALPINGO OOPHERECTOMY Right 01/18/2021   Procedure: XI ROBOTIC ASSISTED RIGHT SALPINGO-OOPHORECTOMY;  Surgeon: Victor Claudell SAUNDERS, MD;  Location: [facility] ORS;  Service: Gynecology;  Laterality: Right;   Family History  Problem Relation Age of Onset   Diabetes Mother    Heart disease Mother    Thyroid disease Mother    Miscarriages / Stillbirths Mother    Stroke Mother    Hypertension Father    Cancer Father    Alcohol abuse Father    Diabetes Father    Stroke Father    Vision loss Father    Stroke Other        Family hx of CVA or stroke   Diabetes Brother    Diabetes Daughter    Diabetes Paternal Uncle    Diabetes Paternal Grandmother    Breast cancer Maternal Aunt    Social History   Occupational History   Occupation: Full time    Employer: [employer]   Occupation: retired  Tobacco Use   Smoking status: Former    Current packs/day: 0.00    Average packs/day: 1.1 packs/day for 20.0 years (22.5 ttl pk-yrs)    Types: Cigarettes    Start date: 08/26/1966    Quit date: 08/26/1981    Years since quitting: 43.1   Smokeless tobacco: Never  Vaping Use   Vaping status: Never Used  Substance and Sexual Activity   Alcohol use: Not Currently   Drug use: No   Sexual activity: Not Currently    Birth control/protection: None   Tobacco Counseling Counseling given: Not Answered  SDOH Screenings   Food Insecurity: No Food Insecurity (09/23/2024)  Housing: Low Risk (09/23/2024)  Transportation Needs: No Transportation Needs (09/23/2024)  Utilities: Not At Risk (09/23/2024)  Alcohol Screen: Low Risk (08/25/2023)  Depression (PHQ2-9): Low Risk (09/23/2024)  Financial Resource Strain: Low Risk (07/09/2024)  Physical Activity:  Insufficiently Active (09/23/2024)  Social Connections: Moderately Integrated (09/23/2024)  Stress: No Stress Concern Present (09/23/2024)  Tobacco Use: Medium Risk (09/23/2024)  Health Literacy: Adequate Health Literacy (09/23/2024)   See flowsheets for full screening details  Depression Screen PHQ 2 & 9 Depression Scale- Over the past 2 weeks, how often have you been bothered by any of the following problems? Little interest or pleasure in doing things: 0 Feeling down, depressed, or hopeless (PHQ Adolescent also includes...irritable): 0 PHQ-2 Total Score: 0 Trouble falling or staying asleep, or sleeping too much: 0  Feeling tired or having little energy: 0 Poor appetite or overeating (PHQ Adolescent also includes...weight loss): 0 Feeling bad about yourself - or that you are a failure or have let yourself or your family down: 0 Trouble concentrating on things, such as reading the newspaper or watching television (PHQ Adolescent also includes...like school work): 0 Moving or speaking so slowly that other people could have noticed. Or the opposite - being so fidgety or restless that you have been moving around a lot more than usual: 0 Thoughts that you would be better off dead, or of hurting yourself in some way: 0 PHQ-9 Total Score: 0 If you checked off any problems, how difficult have these problems made it for you to do your work, take care of things at home, or get along with other people?: Not difficult at all  Depression Treatment Depression Interventions/Treatment : EYV7-0 Score <4 Follow-up Not Indicated     Goals Addressed               This Visit's Progress     Weight (lb) < 180 lb (81.6 kg) (pt-stated)   186 lb (84.4 kg)     COMPLETED: Weight (lb) < 200 lb (90.7 kg)   186 lb (84.4 kg)            Objective:    Today's Vitals   09/23/24 1427  Weight: 186 lb (84.4 kg)  Height: 5' 3.5 (1.613 m)   Body mass index is 32.43 kg/m.  Hearing/Vision screen Hearing  Screening - Comments:: Denies hearing loss  Vision Screening - Comments:: UTD w/ Dr. Jenna Roney Immunizations and Health Maintenance Health Maintenance  Topic Date Due   Bone Density Scan  08/17/2019   COVID-19 Vaccine (6 - 2025-26 season) 04/26/2024   OPHTHALMOLOGY EXAM  01/07/2025   Mammogram  01/21/2025   HEMOGLOBIN A1C  03/01/2025   FOOT EXAM  05/28/2025   Diabetic kidney evaluation - eGFR measurement  09/01/2025   Diabetic kidney evaluation - Urine ACR  09/01/2025   Medicare Annual Wellness (AWV)  09/23/2025   Colonoscopy  01/12/2033   Pneumococcal Vaccine: 50+ Years  Completed   Influenza Vaccine  Completed   Hepatitis C Screening  Completed   Zoster Vaccines- Shingrix  Completed   Meningococcal B Vaccine  Aged Out   DTaP/Tdap/Td  Discontinued   Fecal DNA (Cologuard)  Discontinued        Assessment/Plan:  This is a routine wellness examination for Basha.  Patient Care Team: [name], [name], NP as PCP - General (Nurse Practitioner) Roney, Jenna, OD as Referring Physician (Optometry) Kennyth Chew, MD as Consulting Physician (Cardiology) Claudene Lehmann, MD as Referring Physician (Dermatology) Effie Rush, DC as Referring Physician (Chiropractic Medicine) Carlos Hardin HERO, MD as Referring Physician (Orthopedic Surgery) Joshua Lin, PA-C as Physician Assistant (Orthopedic Surgery)  I have personally reviewed and noted the following in the patients chart:   Medical and social history Use of alcohol, tobacco or illicit drugs  Current medications and supplements including opioid prescriptions. Functional ability and status Nutritional status Physical activity Advanced directives List of other physicians Hospitalizations, surgeries, and ER visits in previous 12 months Vitals Screenings to include cognitive, depression, and falls Referrals and appointments  No orders of the defined types were placed in this encounter.  In addition, I have  reviewed and discussed with patient certain preventive protocols, quality metrics, and best practice recommendations. A written personalized care plan for preventive services as well as general preventive health recommendations were provided  to patient.   Vina Ned, CMA   09/23/2024   Return in 1 year (on 09/27/2025) for Medicare Annual Wellness Visit.  After Visit Summary: (MyChart) Due to this being a telephonic visit, the after visit summary with patients personalized plan was offered to patient via MyChart   Nurse Notes:  FBS this morning was 97 per patient Needs Tdap vaccine (pharmacy) Declined Covid vaccine at this time Declined DM & Nutrition Education referral Declined DEXA at this time. Wants to speak with PCP first. Screening colonoscopy no longer recommended due to age. "

## 2024-09-23 NOTE — Patient Instructions (Signed)
 Ms. Westra,  Thank you for taking the time for your Medicare Wellness Visit. I appreciate your continued commitment to your health goals. Please review the care plan we discussed, and feel free to reach out if I can assist you further.  Please note that Annual Wellness Visits do not include a physical exam. Some assessments may be limited, especially if the visit was conducted virtually. If needed, we may recommend an in-person follow-up with your provider.  Ongoing Care Seeing your primary care provider every 3 to 6 months helps us  monitor your health and provide consistent, personalized care.   Referrals If a referral was made during today's visit and you haven't received any updates within two weeks, please contact the referred provider directly to check on the status.  Recommended Screenings:  You may get a tetanus vaccine at your local pharmacy at your convenience.   Health Maintenance  Topic Date Due   Osteoporosis screening with Bone Density Scan  08/17/2019   COVID-19 Vaccine (6 - 2025-26 season) 04/26/2024   Medicare Annual Wellness Visit  08/24/2024   Eye exam for diabetics  01/07/2025   Breast Cancer Screening  01/21/2025   Hemoglobin A1C  03/01/2025   Complete foot exam   05/28/2025   Yearly kidney function blood test for diabetes  09/01/2025   Kidney health urinalysis for diabetes  09/01/2025   Colon Cancer Screening  01/12/2033   Pneumococcal Vaccine for age over 67  Completed   Flu Shot  Completed   Hepatitis C Screening  Completed   Zoster (Shingles) Vaccine  Completed   Meningitis B Vaccine  Aged Out   DTaP/Tdap/Td vaccine  Discontinued   Cologuard (Stool DNA test)  Discontinued       09/23/2024    2:37 PM  Advanced Directives  Does Patient Have a Medical Advance Directive? Yes  Type of Estate Agent of [name];Living will  Does patient want to make changes to medical advance directive? No - Patient declined  Copy of Healthcare Power  of Attorney in Chart? No - copy requested    Vision: Annual vision screenings are recommended for early detection of glaucoma, cataracts, and diabetic retinopathy. These exams can also reveal signs of chronic conditions such as diabetes and high blood pressure.  Dental: Annual dental screenings help detect early signs of oral cancer, gum disease, and other conditions linked to overall health, including heart disease and diabetes.  Please see the attached documents for additional preventive care recommendations.   Managing Pain Without Opioids Opioids are strong medicines used to treat moderate to severe pain. For some people, especially those who have long-term (chronic) pain, opioids may not be the best choice for pain management due to: Side effects like nausea, constipation, and sleepiness. The risk of addiction (opioid use disorder). The longer you take opioids, the greater your risk of addiction. Pain that lasts for more than 3 months is called chronic pain. Managing chronic pain usually requires more than one approach and is often provided by a team of health care providers working together (multidisciplinary approach). Pain management may be done at a pain management center or pain clinic. How to manage pain without the use of opioids Use non-opioid medicines Non-opioid medicines for pain may include: Over-the-counter or prescription non-steroidal anti-inflammatory drugs (NSAIDs). These may be the first medicines used for pain. They work well for muscle and bone pain, and they reduce swelling. Acetaminophen . This over-the-counter medicine may work well for milder pain but not swelling. Antidepressants. These  may be used to treat chronic pain. A certain type of antidepressant (tricyclics) is often used. These medicines are given in lower doses for pain than when used for depression. Anticonvulsants. These are usually used to treat seizures but may also reduce nerve (neuropathic)  pain. Muscle relaxants. These relieve pain caused by sudden muscle tightening (spasms). You may also use a pain medicine that is applied to the skin as a patch, cream, or gel (topical analgesic), such as a numbing medicine. These may cause fewer side effects than medicines taken by mouth. Do certain therapies as directed Some therapies can help with pain management. They include: Physical therapy. You will do exercises to gain strength and flexibility. A physical therapist may teach you exercises to move and stretch parts of your body that are weak, stiff, or painful. You can learn these exercises at physical therapy visits and practice them at home. Physical therapy may also involve: Massage. Heat wraps or applying heat or cold to affected areas. Electrical signals that interrupt pain signals (transcutaneous electrical nerve stimulation, TENS). Weak lasers that reduce pain and swelling (low-level laser therapy). Signals from your body that help you learn to regulate pain (biofeedback). Occupational therapy. This helps you to learn ways to function at home and work with less pain. Recreational therapy. This involves trying new activities or hobbies, such as a physical activity or drawing. Mental health therapy, including: Cognitive behavioral therapy (CBT). This helps you learn coping skills for dealing with pain. Acceptance and commitment therapy (ACT) to change the way you think and react to pain. Relaxation therapies, including muscle relaxation exercises and mindfulness-based stress reduction. Pain management counseling. This may be individual, family, or group counseling.  Receive medical treatments Medical treatments for pain management include: Nerve block injections. These may include a pain blocker and anti-inflammatory medicines. You may have injections: Near the spine to relieve chronic back or neck pain. Into joints to relieve back or joint pain. Into nerve areas that supply a  painful area to relieve body pain. Into muscles (trigger point injections) to relieve some painful muscle conditions. A medical device placed near your spine to help block pain signals and relieve nerve pain or chronic back pain (spinal cord stimulation device). Acupuncture. Follow these instructions at home Medicines Take over-the-counter and prescription medicines only as told by your health care provider. If you are taking pain medicine, ask your health care providers about possible side effects to watch out for. Do not drive or use heavy machinery while taking prescription opioid pain medicine. Lifestyle  Do not use drugs or alcohol to reduce pain. If you drink alcohol, limit how much you have to: 0-1 drink a day for women who are not pregnant. 0-2 drinks a day for men. Know how much alcohol is in a drink. In the [place] one drink equals one 12 oz bottle of beer (355 mL), one 5 oz glass of wine (148 mL), or one 1 oz glass of hard liquor (44 mL). Do not use any products that contain nicotine or tobacco. These products include cigarettes, chewing tobacco, and vaping devices, such as e-cigarettes. If you need help quitting, ask your health care provider. Eat a healthy diet and maintain a healthy weight. Poor diet and excess weight may make pain worse. Eat foods that are high in fiber. These include fresh fruits and vegetables, whole grains, and beans. Limit foods that are high in fat and processed sugars, such as fried and sweet foods. Exercise regularly. Exercise lowers stress and  may help relieve pain. Ask your health care provider what activities and exercises are safe for you. If your health care provider approves, join an exercise class that combines movement and stress reduction. Examples include yoga and tai chi. Get enough sleep. Lack of sleep may make pain worse. Lower stress as much as possible. Practice stress reduction techniques as told by your therapist. General  instructions Work with all your pain management providers to find the treatments that work best for you. You are an important member of your pain management team. There are many things you can do to reduce pain on your own. Consider joining an online or in-person support group for people who have chronic pain. Keep all follow-up visits. This is important. Where to find more information You can find more information about managing pain without opioids from: American Academy of Pain Medicine: painmed.org Institute for Chronic Pain: instituteforchronicpain.org American Chronic Pain Association: theacpa.org Contact a health care provider if: You have side effects from pain medicine. Your pain gets worse or does not get better with treatments or home therapy. You are struggling with anxiety or depression. Summary Many types of pain can be managed without opioids. Chronic pain may respond better to pain management without opioids. Pain is best managed when you and a team of health care providers work together. Pain management without opioids may include non-opioid medicines, medical treatments, physical therapy, mental health therapy, and lifestyle changes. Tell your health care providers if your pain gets worse or is not being managed well enough. This information is not intended to replace advice given to you by your health care provider. Make sure you discuss any questions you have with your health care provider. Document Revised: 11/22/2020 Document Reviewed: 11/22/2020 Elsevier Patient Education  2024 [employer].

## 2024-10-25 ENCOUNTER — Ambulatory Visit: Payer: Medicare PPO | Admitting: Dermatology

## 2024-11-01 ENCOUNTER — Ambulatory Visit: Admitting: Cardiology

## 2024-11-08 ENCOUNTER — Encounter

## 2024-11-15 ENCOUNTER — Encounter

## 2024-12-15 ENCOUNTER — Encounter: Admitting: Nurse Practitioner

## 2025-02-07 ENCOUNTER — Encounter

## 2025-02-14 ENCOUNTER — Encounter

## 2025-05-09 ENCOUNTER — Encounter

## 2025-05-16 ENCOUNTER — Encounter

## 2025-08-08 ENCOUNTER — Encounter

## 2025-08-15 ENCOUNTER — Encounter

## 2025-09-27 ENCOUNTER — Ambulatory Visit

## 2025-11-07 ENCOUNTER — Encounter

## 2025-11-14 ENCOUNTER — Encounter

## 2026-02-06 ENCOUNTER — Encounter

## 2026-02-13 ENCOUNTER — Encounter
